# Patient Record
Sex: Female | Born: 1960 | Race: White | Hispanic: No | Smoking: Former smoker
Health system: Southern US, Community
[De-identification: ages and names within clinical notes are randomized; demographics above are authoritative.]

## PROBLEM LIST (undated history)

## (undated) DIAGNOSIS — Z8719 Personal history of other diseases of the digestive system: Secondary | ICD-10-CM

## (undated) DIAGNOSIS — E785 Hyperlipidemia, unspecified: Secondary | ICD-10-CM

## (undated) DIAGNOSIS — F411 Generalized anxiety disorder: Secondary | ICD-10-CM

## (undated) DIAGNOSIS — M1990 Unspecified osteoarthritis, unspecified site: Secondary | ICD-10-CM

## (undated) DIAGNOSIS — E039 Hypothyroidism, unspecified: Secondary | ICD-10-CM

## (undated) DIAGNOSIS — R062 Wheezing: Secondary | ICD-10-CM

## (undated) DIAGNOSIS — I10 Essential (primary) hypertension: Secondary | ICD-10-CM

## (undated) DIAGNOSIS — G4730 Sleep apnea, unspecified: Secondary | ICD-10-CM

## (undated) DIAGNOSIS — N959 Unspecified menopausal and perimenopausal disorder: Secondary | ICD-10-CM

## (undated) DIAGNOSIS — IMO0002 Reserved for concepts with insufficient information to code with codable children: Secondary | ICD-10-CM

## (undated) DIAGNOSIS — Z8489 Family history of other specified conditions: Secondary | ICD-10-CM

## (undated) DIAGNOSIS — R351 Nocturia: Secondary | ICD-10-CM

## (undated) DIAGNOSIS — J019 Acute sinusitis, unspecified: Secondary | ICD-10-CM

## (undated) DIAGNOSIS — K219 Gastro-esophageal reflux disease without esophagitis: Secondary | ICD-10-CM

## (undated) DIAGNOSIS — M549 Dorsalgia, unspecified: Secondary | ICD-10-CM

## (undated) DIAGNOSIS — G4733 Obstructive sleep apnea (adult) (pediatric): Secondary | ICD-10-CM

## (undated) HISTORY — DX: Gastro-esophageal reflux disease without esophagitis: K21.9

## (undated) HISTORY — DX: Hyperlipidemia, unspecified: E78.5

## (undated) HISTORY — DX: Unspecified osteoarthritis, unspecified site: M19.90

## (undated) HISTORY — DX: Hypothyroidism, unspecified: E03.9

## (undated) HISTORY — DX: Dorsalgia, unspecified: M54.9

## (undated) HISTORY — DX: Wheezing: R06.2

## (undated) HISTORY — DX: Acute sinusitis, unspecified: J01.90

## (undated) HISTORY — DX: Sleep apnea, unspecified: G47.30

## (undated) HISTORY — PX: TONSILLECTOMY: SUR1361

## (undated) HISTORY — PX: OTHER SURGICAL HISTORY: SHX169

## (undated) HISTORY — DX: Obstructive sleep apnea (adult) (pediatric): G47.33

## (undated) HISTORY — DX: Generalized anxiety disorder: F41.1

## (undated) HISTORY — DX: Essential (primary) hypertension: I10

## (undated) HISTORY — DX: Reserved for concepts with insufficient information to code with codable children: IMO0002

## (undated) HISTORY — DX: Unspecified menopausal and perimenopausal disorder: N95.9

---
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
## 1998-03-11 ENCOUNTER — Other Ambulatory Visit: Admission: RE | Admit: 1998-03-11 | Discharge: 1998-03-11 | Payer: Self-pay | Admitting: Obstetrics and Gynecology

## 1999-04-10 ENCOUNTER — Other Ambulatory Visit: Admission: RE | Admit: 1999-04-10 | Discharge: 1999-04-10 | Payer: Self-pay | Admitting: Obstetrics and Gynecology

## 2002-01-26 HISTORY — PX: ABDOMINAL HYSTERECTOMY: SHX81

## 2004-01-23 ENCOUNTER — Ambulatory Visit: Payer: Self-pay | Admitting: Internal Medicine

## 2004-01-24 ENCOUNTER — Encounter: Admission: RE | Admit: 2004-01-24 | Discharge: 2004-01-24 | Payer: Self-pay | Admitting: Internal Medicine

## 2004-01-24 ENCOUNTER — Ambulatory Visit: Payer: Self-pay | Admitting: Internal Medicine

## 2004-03-28 ENCOUNTER — Ambulatory Visit: Payer: Self-pay | Admitting: Internal Medicine

## 2004-04-16 ENCOUNTER — Other Ambulatory Visit: Admission: RE | Admit: 2004-04-16 | Discharge: 2004-04-16 | Payer: Self-pay | Admitting: Gynecology

## 2005-04-20 ENCOUNTER — Other Ambulatory Visit: Admission: RE | Admit: 2005-04-20 | Discharge: 2005-04-20 | Payer: Self-pay | Admitting: Gynecology

## 2005-04-24 ENCOUNTER — Encounter: Admission: RE | Admit: 2005-04-24 | Discharge: 2005-04-24 | Payer: Self-pay | Admitting: Gynecology

## 2005-05-04 ENCOUNTER — Ambulatory Visit: Payer: Self-pay | Admitting: Internal Medicine

## 2005-05-08 ENCOUNTER — Encounter: Admission: RE | Admit: 2005-05-08 | Discharge: 2005-05-08 | Payer: Self-pay | Admitting: Gynecology

## 2005-06-30 ENCOUNTER — Ambulatory Visit: Payer: Self-pay | Admitting: Internal Medicine

## 2006-01-26 LAB — CONVERTED CEMR LAB: Pap Smear: NORMAL

## 2006-02-13 ENCOUNTER — Ambulatory Visit: Payer: Self-pay | Admitting: Family Medicine

## 2006-04-21 ENCOUNTER — Ambulatory Visit: Payer: Self-pay | Admitting: Internal Medicine

## 2006-04-22 ENCOUNTER — Other Ambulatory Visit: Admission: RE | Admit: 2006-04-22 | Discharge: 2006-04-22 | Payer: Self-pay | Admitting: Gynecology

## 2006-05-11 ENCOUNTER — Encounter: Admission: RE | Admit: 2006-05-11 | Discharge: 2006-05-11 | Payer: Self-pay | Admitting: Gynecology

## 2006-05-25 ENCOUNTER — Ambulatory Visit: Payer: Self-pay | Admitting: Internal Medicine

## 2007-01-13 ENCOUNTER — Ambulatory Visit: Payer: Self-pay | Admitting: Internal Medicine

## 2007-01-13 DIAGNOSIS — K219 Gastro-esophageal reflux disease without esophagitis: Secondary | ICD-10-CM | POA: Insufficient documentation

## 2007-01-13 HISTORY — DX: Gastro-esophageal reflux disease without esophagitis: K21.9

## 2007-01-13 LAB — CONVERTED CEMR LAB
ALT: 18 units/L (ref 0–35)
AST: 19 units/L (ref 0–37)
Albumin: 3.7 g/dL (ref 3.5–5.2)
Alkaline Phosphatase: 53 units/L (ref 39–117)
BUN: 5 mg/dL — ABNORMAL LOW (ref 6–23)
Basophils Absolute: 0 10*3/uL (ref 0.0–0.1)
Basophils Relative: 0.6 % (ref 0.0–1.0)
Bilirubin Urine: NEGATIVE
Bilirubin, Direct: 0.1 mg/dL (ref 0.0–0.3)
CO2: 31 meq/L (ref 19–32)
Calcium: 9 mg/dL (ref 8.4–10.5)
Chloride: 103 meq/L (ref 96–112)
Cholesterol: 195 mg/dL (ref 0–200)
Creatinine, Ser: 0.7 mg/dL (ref 0.4–1.2)
Eosinophils Absolute: 0.1 10*3/uL (ref 0.0–0.6)
Eosinophils Relative: 1.2 % (ref 0.0–5.0)
GFR calc Af Amer: 115 mL/min
GFR calc non Af Amer: 95 mL/min
Glucose, Bld: 91 mg/dL (ref 70–99)
HCT: 37.7 % (ref 36.0–46.0)
HDL: 53.9 mg/dL (ref >39.0)
Hemoglobin, Urine: NEGATIVE
Hemoglobin: 13.1 g/dL (ref 12.0–15.0)
Ketones, ur: NEGATIVE mg/dL
LDL Cholesterol: 120 mg/dL — ABNORMAL HIGH (ref 0–99)
Leukocytes, UA: NEGATIVE
Lymphocytes Relative: 25.2 % (ref 12.0–46.0)
MCHC: 34.7 g/dL (ref 30.0–36.0)
MCV: 87.1 fL (ref 78.0–100.0)
Monocytes Absolute: 0.7 10*3/uL (ref 0.2–0.7)
Monocytes Relative: 10.9 % (ref 3.0–11.0)
Neutro Abs: 4.3 10*3/uL (ref 1.4–7.7)
Neutrophils Relative %: 62.1 % (ref 43.0–77.0)
Nitrite: NEGATIVE
Platelets: 303 10*3/uL (ref 150–400)
Potassium: 3.9 meq/L (ref 3.5–5.1)
RBC: 4.32 M/uL (ref 3.87–5.11)
RDW: 11.6 % (ref 11.5–14.6)
Sodium: 139 meq/L (ref 135–145)
Specific Gravity, Urine: 1.01 (ref 1.000–1.03)
TSH: 3.02 microintl units/mL (ref 0.35–5.50)
Total Bilirubin: 0.6 mg/dL (ref 0.3–1.2)
Total CHOL/HDL Ratio: 3.6
Total Protein, Urine: NEGATIVE mg/dL
Total Protein: 6.4 g/dL (ref 6.0–8.3)
Triglycerides: 108 mg/dL (ref 0–149)
Urine Glucose: NEGATIVE mg/dL
Urobilinogen, UA: 0.2 (ref 0.0–1.0)
VLDL: 22 mg/dL (ref 0–40)
WBC: 6.8 10*3/uL (ref 4.5–10.5)
pH: 8 (ref 5.0–8.0)

## 2007-03-28 LAB — CONVERTED CEMR LAB: Pap Smear: NORMAL

## 2007-03-30 ENCOUNTER — Ambulatory Visit: Payer: Self-pay | Admitting: Internal Medicine

## 2007-03-30 DIAGNOSIS — F329 Major depressive disorder, single episode, unspecified: Secondary | ICD-10-CM

## 2007-03-30 DIAGNOSIS — J019 Acute sinusitis, unspecified: Secondary | ICD-10-CM

## 2007-03-30 DIAGNOSIS — F32A Depression, unspecified: Secondary | ICD-10-CM | POA: Insufficient documentation

## 2007-03-30 DIAGNOSIS — F411 Generalized anxiety disorder: Secondary | ICD-10-CM

## 2007-03-30 DIAGNOSIS — E039 Hypothyroidism, unspecified: Secondary | ICD-10-CM | POA: Insufficient documentation

## 2007-03-30 HISTORY — DX: Acute sinusitis, unspecified: J01.90

## 2007-03-30 HISTORY — DX: Generalized anxiety disorder: F41.1

## 2007-03-30 HISTORY — DX: Hypothyroidism, unspecified: E03.9

## 2007-04-05 ENCOUNTER — Telehealth (INDEPENDENT_AMBULATORY_CARE_PROVIDER_SITE_OTHER): Payer: Self-pay | Admitting: *Deleted

## 2007-04-15 ENCOUNTER — Ambulatory Visit: Payer: Self-pay | Admitting: Internal Medicine

## 2007-04-25 ENCOUNTER — Other Ambulatory Visit: Admission: RE | Admit: 2007-04-25 | Discharge: 2007-04-25 | Payer: Self-pay | Admitting: Gynecology

## 2007-05-12 ENCOUNTER — Encounter: Admission: RE | Admit: 2007-05-12 | Discharge: 2007-05-12 | Payer: Self-pay | Admitting: Gynecology

## 2007-06-29 ENCOUNTER — Ambulatory Visit: Payer: Self-pay | Admitting: Internal Medicine

## 2007-06-29 DIAGNOSIS — R5383 Other fatigue: Secondary | ICD-10-CM | POA: Insufficient documentation

## 2007-06-29 DIAGNOSIS — R5381 Other malaise: Secondary | ICD-10-CM | POA: Insufficient documentation

## 2007-06-29 DIAGNOSIS — G4733 Obstructive sleep apnea (adult) (pediatric): Secondary | ICD-10-CM | POA: Insufficient documentation

## 2007-06-29 HISTORY — DX: Obstructive sleep apnea (adult) (pediatric): G47.33

## 2007-06-29 LAB — CONVERTED CEMR LAB
ALT: 16 units/L (ref 0–35)
AST: 18 units/L (ref 0–37)
Albumin: 3.7 g/dL (ref 3.5–5.2)
Alkaline Phosphatase: 55 units/L (ref 39–117)
BUN: 9 mg/dL (ref 6–23)
Basophils Absolute: 0 10*3/uL (ref 0.0–0.1)
Basophils Relative: 0.6 % (ref 0.0–1.0)
Bilirubin, Direct: 0.1 mg/dL (ref 0.0–0.3)
CO2: 30 meq/L (ref 19–32)
Calcium: 9.1 mg/dL (ref 8.4–10.5)
Chloride: 102 meq/L (ref 96–112)
Creatinine, Ser: 0.6 mg/dL (ref 0.4–1.2)
Eosinophils Absolute: 0.1 10*3/uL (ref 0.0–0.7)
Eosinophils Relative: 1.5 % (ref 0.0–5.0)
GFR calc Af Amer: 138 mL/min
GFR calc non Af Amer: 114 mL/min
Glucose, Bld: 83 mg/dL (ref 70–99)
HCT: 37.1 % (ref 36.0–46.0)
Hemoglobin: 13.2 g/dL (ref 12.0–15.0)
Lymphocytes Relative: 39.4 % (ref 12.0–46.0)
MCHC: 35.6 g/dL (ref 30.0–36.0)
MCV: 87.6 fL (ref 78.0–100.0)
Monocytes Absolute: 0.7 10*3/uL (ref 0.1–1.0)
Monocytes Relative: 10.1 % (ref 3.0–12.0)
Neutro Abs: 3.7 10*3/uL (ref 1.4–7.7)
Neutrophils Relative %: 48.4 % (ref 43.0–77.0)
Platelets: 304 10*3/uL (ref 150–400)
Potassium: 3.8 meq/L (ref 3.5–5.1)
RBC: 4.24 M/uL (ref 3.87–5.11)
RDW: 11.6 % (ref 11.5–14.6)
Sodium: 139 meq/L (ref 135–145)
TSH: 1.05 microintl units/mL (ref 0.35–5.50)
Total Bilirubin: 0.6 mg/dL (ref 0.3–1.2)
Total Protein: 6.6 g/dL (ref 6.0–8.3)
WBC: 7.4 10*3/uL (ref 4.5–10.5)

## 2007-07-08 ENCOUNTER — Ambulatory Visit: Payer: Self-pay | Admitting: Internal Medicine

## 2007-07-22 ENCOUNTER — Ambulatory Visit: Payer: Self-pay | Admitting: Pulmonary Disease

## 2007-08-11 ENCOUNTER — Ambulatory Visit (HOSPITAL_BASED_OUTPATIENT_CLINIC_OR_DEPARTMENT_OTHER): Admission: RE | Admit: 2007-08-11 | Discharge: 2007-08-11 | Payer: Self-pay | Admitting: Pulmonary Disease

## 2007-08-11 ENCOUNTER — Encounter: Payer: Self-pay | Admitting: Pulmonary Disease

## 2007-08-18 ENCOUNTER — Ambulatory Visit: Payer: Self-pay | Admitting: Pulmonary Disease

## 2007-08-29 ENCOUNTER — Telehealth (INDEPENDENT_AMBULATORY_CARE_PROVIDER_SITE_OTHER): Payer: Self-pay | Admitting: *Deleted

## 2007-09-01 ENCOUNTER — Ambulatory Visit: Payer: Self-pay | Admitting: Pulmonary Disease

## 2007-09-28 ENCOUNTER — Ambulatory Visit: Payer: Self-pay | Admitting: Pulmonary Disease

## 2007-10-26 ENCOUNTER — Encounter: Payer: Self-pay | Admitting: Pulmonary Disease

## 2007-11-23 ENCOUNTER — Ambulatory Visit: Payer: Self-pay | Admitting: Internal Medicine

## 2007-11-23 DIAGNOSIS — R42 Dizziness and giddiness: Secondary | ICD-10-CM | POA: Insufficient documentation

## 2007-11-24 ENCOUNTER — Telehealth (INDEPENDENT_AMBULATORY_CARE_PROVIDER_SITE_OTHER): Payer: Self-pay | Admitting: *Deleted

## 2007-12-06 ENCOUNTER — Telehealth (INDEPENDENT_AMBULATORY_CARE_PROVIDER_SITE_OTHER): Payer: Self-pay | Admitting: *Deleted

## 2008-02-06 ENCOUNTER — Telehealth: Payer: Self-pay | Admitting: Pulmonary Disease

## 2008-02-16 ENCOUNTER — Ambulatory Visit: Payer: Self-pay | Admitting: Internal Medicine

## 2008-02-20 ENCOUNTER — Telehealth (INDEPENDENT_AMBULATORY_CARE_PROVIDER_SITE_OTHER): Payer: Self-pay | Admitting: *Deleted

## 2008-02-21 ENCOUNTER — Ambulatory Visit: Payer: Self-pay | Admitting: Internal Medicine

## 2008-02-22 ENCOUNTER — Telehealth (INDEPENDENT_AMBULATORY_CARE_PROVIDER_SITE_OTHER): Payer: Self-pay | Admitting: *Deleted

## 2008-02-22 DIAGNOSIS — E785 Hyperlipidemia, unspecified: Secondary | ICD-10-CM

## 2008-02-22 HISTORY — DX: Hyperlipidemia, unspecified: E78.5

## 2008-02-22 LAB — CONVERTED CEMR LAB
ALT: 20 units/L (ref 0–35)
AST: 17 units/L (ref 0–37)
Albumin: 3.8 g/dL (ref 3.5–5.2)
Alkaline Phosphatase: 42 units/L (ref 39–117)
BUN: 9 mg/dL (ref 6–23)
Basophils Absolute: 0.1 10*3/uL (ref 0.0–0.1)
Basophils Relative: 1.1 % (ref 0.0–3.0)
Bilirubin Urine: NEGATIVE
Bilirubin, Direct: 0.1 mg/dL (ref 0.0–0.3)
CO2: 31 meq/L (ref 19–32)
Calcium: 9.5 mg/dL (ref 8.4–10.5)
Chloride: 103 meq/L (ref 96–112)
Cholesterol: 231 mg/dL (ref 0–200)
Creatinine, Ser: 0.6 mg/dL (ref 0.4–1.2)
Direct LDL: 128.6 mg/dL
Eosinophils Absolute: 0.1 10*3/uL (ref 0.0–0.7)
Eosinophils Relative: 1.9 % (ref 0.0–5.0)
GFR calc Af Amer: 137 mL/min
GFR calc non Af Amer: 113 mL/min
Glucose, Bld: 90 mg/dL (ref 70–99)
HCT: 36.9 % (ref 36.0–46.0)
HDL: 67 mg/dL (ref >39.0)
Hemoglobin, Urine: NEGATIVE
Hemoglobin: 12.8 g/dL (ref 12.0–15.0)
Ketones, ur: NEGATIVE mg/dL
Leukocytes, UA: NEGATIVE
Lymphocytes Relative: 43.7 % (ref 12.0–46.0)
MCHC: 34.6 g/dL (ref 30.0–36.0)
MCV: 89.9 fL (ref 78.0–100.0)
Monocytes Absolute: 0.5 10*3/uL (ref 0.1–1.0)
Monocytes Relative: 9.6 % (ref 3.0–12.0)
Neutro Abs: 2.2 10*3/uL (ref 1.4–7.7)
Neutrophils Relative %: 43.7 % (ref 43.0–77.0)
Nitrite: NEGATIVE
Platelets: 293 10*3/uL (ref 150–400)
Potassium: 4.3 meq/L (ref 3.5–5.1)
RBC: 4.1 M/uL (ref 3.87–5.11)
RDW: 11.9 % (ref 11.5–14.6)
Sodium: 141 meq/L (ref 135–145)
Specific Gravity, Urine: 1.025 (ref 1.000–1.03)
TSH: 10.28 microintl units/mL — ABNORMAL HIGH (ref 0.35–5.50)
Total Bilirubin: 0.5 mg/dL (ref 0.3–1.2)
Total CHOL/HDL Ratio: 3.4
Total Protein, Urine: NEGATIVE mg/dL
Total Protein: 6.5 g/dL (ref 6.0–8.3)
Triglycerides: 111 mg/dL (ref 0–149)
Urine Glucose: NEGATIVE mg/dL
Urobilinogen, UA: 0.2 (ref 0.0–1.0)
VLDL: 22 mg/dL (ref 0–40)
WBC: 5.1 10*3/uL (ref 4.5–10.5)
pH: 6 (ref 5.0–8.0)

## 2008-02-23 ENCOUNTER — Ambulatory Visit: Payer: Self-pay | Admitting: Internal Medicine

## 2008-02-23 ENCOUNTER — Encounter: Admission: RE | Admit: 2008-02-23 | Discharge: 2008-02-23 | Payer: Self-pay | Admitting: Internal Medicine

## 2008-02-23 DIAGNOSIS — IMO0002 Reserved for concepts with insufficient information to code with codable children: Secondary | ICD-10-CM

## 2008-02-23 DIAGNOSIS — N959 Unspecified menopausal and perimenopausal disorder: Secondary | ICD-10-CM

## 2008-02-23 HISTORY — DX: Reserved for concepts with insufficient information to code with codable children: IMO0002

## 2008-02-23 HISTORY — DX: Unspecified menopausal and perimenopausal disorder: N95.9

## 2008-03-22 ENCOUNTER — Ambulatory Visit: Payer: Self-pay | Admitting: Internal Medicine

## 2008-03-22 LAB — CONVERTED CEMR LAB: TSH: 6.02 microintl units/mL — ABNORMAL HIGH (ref 0.35–5.50)

## 2008-03-26 LAB — HM MAMMOGRAPHY: HM Mammogram: NORMAL

## 2008-03-27 ENCOUNTER — Ambulatory Visit: Payer: Self-pay | Admitting: Pulmonary Disease

## 2008-04-20 ENCOUNTER — Encounter: Payer: Self-pay | Admitting: Internal Medicine

## 2008-04-24 ENCOUNTER — Encounter: Payer: Self-pay | Admitting: Internal Medicine

## 2008-05-01 ENCOUNTER — Encounter: Admission: RE | Admit: 2008-05-01 | Discharge: 2008-05-01 | Payer: Self-pay | Admitting: Orthopedic Surgery

## 2008-05-07 ENCOUNTER — Other Ambulatory Visit: Admission: RE | Admit: 2008-05-07 | Discharge: 2008-05-07 | Payer: Self-pay | Admitting: Gynecology

## 2008-05-07 ENCOUNTER — Ambulatory Visit: Payer: Self-pay | Admitting: Women's Health

## 2008-05-07 ENCOUNTER — Encounter: Payer: Self-pay | Admitting: Women's Health

## 2008-05-15 ENCOUNTER — Encounter: Admission: RE | Admit: 2008-05-15 | Discharge: 2008-05-15 | Payer: Self-pay | Admitting: Gynecology

## 2008-06-05 ENCOUNTER — Ambulatory Visit: Payer: Self-pay | Admitting: Internal Medicine

## 2008-06-27 ENCOUNTER — Encounter: Payer: Self-pay | Admitting: Internal Medicine

## 2008-08-08 ENCOUNTER — Ambulatory Visit: Payer: Self-pay | Admitting: Internal Medicine

## 2009-01-08 ENCOUNTER — Ambulatory Visit: Payer: Self-pay | Admitting: Internal Medicine

## 2009-01-08 DIAGNOSIS — R062 Wheezing: Secondary | ICD-10-CM

## 2009-01-08 HISTORY — DX: Wheezing: R06.2

## 2009-02-20 ENCOUNTER — Ambulatory Visit: Payer: Self-pay | Admitting: Internal Medicine

## 2009-02-20 DIAGNOSIS — M549 Dorsalgia, unspecified: Secondary | ICD-10-CM

## 2009-02-20 DIAGNOSIS — R300 Dysuria: Secondary | ICD-10-CM | POA: Insufficient documentation

## 2009-02-20 HISTORY — DX: Dorsalgia, unspecified: M54.9

## 2009-02-20 LAB — CONVERTED CEMR LAB
Bilirubin Urine: NEGATIVE
Glucose, Urine, Semiquant: NEGATIVE
Ketones, urine, test strip: NEGATIVE
Nitrite: NEGATIVE
Protein, U semiquant: NEGATIVE
Specific Gravity, Urine: 1.01
Urobilinogen, UA: 0.2
WBC Urine, dipstick: NEGATIVE
pH: 6

## 2009-02-21 ENCOUNTER — Encounter: Payer: Self-pay | Admitting: Internal Medicine

## 2009-03-15 ENCOUNTER — Ambulatory Visit: Payer: Self-pay | Admitting: Internal Medicine

## 2009-03-15 LAB — CONVERTED CEMR LAB
ALT: 17 units/L (ref 0–35)
AST: 18 units/L (ref 0–37)
Albumin: 3.8 g/dL (ref 3.5–5.2)
Alkaline Phosphatase: 47 units/L (ref 39–117)
BUN: 8 mg/dL (ref 6–23)
Basophils Absolute: 0 10*3/uL (ref 0.0–0.1)
Basophils Relative: 0.6 % (ref 0.0–3.0)
Bilirubin Urine: NEGATIVE
Bilirubin, Direct: 0.1 mg/dL (ref 0.0–0.3)
CO2: 30 meq/L (ref 19–32)
Calcium: 8.9 mg/dL (ref 8.4–10.5)
Chloride: 102 meq/L (ref 96–112)
Cholesterol: 232 mg/dL — ABNORMAL HIGH (ref 0–200)
Creatinine, Ser: 0.5 mg/dL (ref 0.4–1.2)
Direct LDL: 102.1 mg/dL
Eosinophils Absolute: 0.1 10*3/uL (ref 0.0–0.7)
Eosinophils Relative: 1.5 % (ref 0.0–5.0)
GFR calc non Af Amer: 139.22 mL/min (ref >60)
Glucose, Bld: 83 mg/dL (ref 70–99)
HCT: 39.2 % (ref 36.0–46.0)
HDL: 86.8 mg/dL (ref >39.00)
Hemoglobin, Urine: NEGATIVE
Hemoglobin: 13.3 g/dL (ref 12.0–15.0)
Ketones, ur: NEGATIVE mg/dL
Leukocytes, UA: NEGATIVE
Lymphocytes Relative: 34.8 % (ref 12.0–46.0)
Lymphs Abs: 1.8 10*3/uL (ref 0.7–4.0)
MCHC: 33.8 g/dL (ref 30.0–36.0)
MCV: 92 fL (ref 78.0–100.0)
Monocytes Absolute: 0.5 10*3/uL (ref 0.1–1.0)
Monocytes Relative: 8.9 % (ref 3.0–12.0)
Neutro Abs: 2.9 10*3/uL (ref 1.4–7.7)
Neutrophils Relative %: 54.2 % (ref 43.0–77.0)
Nitrite: NEGATIVE
Platelets: 341 10*3/uL (ref 150.0–400.0)
Potassium: 4 meq/L (ref 3.5–5.1)
RBC: 4.26 M/uL (ref 3.87–5.11)
RDW: 12.2 % (ref 11.5–14.6)
Sodium: 138 meq/L (ref 135–145)
Specific Gravity, Urine: 1.01 (ref 1.000–1.030)
TSH: 3.02 microintl units/mL (ref 0.35–5.50)
Total Bilirubin: 0.5 mg/dL (ref 0.3–1.2)
Total CHOL/HDL Ratio: 3
Total Protein, Urine: NEGATIVE mg/dL
Total Protein: 7 g/dL (ref 6.0–8.3)
Triglycerides: 286 mg/dL — ABNORMAL HIGH (ref 0.0–149.0)
Urine Glucose: NEGATIVE mg/dL
Urobilinogen, UA: 0.2 (ref 0.0–1.0)
VLDL: 57.2 mg/dL — ABNORMAL HIGH (ref 0.0–40.0)
WBC: 5.3 10*3/uL (ref 4.5–10.5)
pH: 7 (ref 5.0–8.0)

## 2009-03-22 ENCOUNTER — Ambulatory Visit: Payer: Self-pay | Admitting: Internal Medicine

## 2009-03-25 ENCOUNTER — Telehealth: Payer: Self-pay | Admitting: Internal Medicine

## 2009-05-08 ENCOUNTER — Other Ambulatory Visit: Admission: RE | Admit: 2009-05-08 | Discharge: 2009-05-08 | Payer: Self-pay | Admitting: Gynecology

## 2009-05-08 ENCOUNTER — Ambulatory Visit: Payer: Self-pay | Admitting: Women's Health

## 2009-05-16 ENCOUNTER — Encounter: Admission: RE | Admit: 2009-05-16 | Discharge: 2009-05-16 | Payer: Self-pay | Admitting: Gynecology

## 2009-08-29 ENCOUNTER — Ambulatory Visit: Payer: Self-pay | Admitting: Internal Medicine

## 2009-11-01 ENCOUNTER — Ambulatory Visit: Payer: Self-pay | Admitting: Internal Medicine

## 2009-11-01 DIAGNOSIS — R1013 Epigastric pain: Secondary | ICD-10-CM | POA: Insufficient documentation

## 2009-11-01 LAB — CONVERTED CEMR LAB
ALT: 13 units/L (ref 0–35)
AST: 19 units/L (ref 0–37)
Albumin: 3.8 g/dL (ref 3.5–5.2)
Alkaline Phosphatase: 52 units/L (ref 39–117)
BUN: 8 mg/dL (ref 6–23)
Basophils Absolute: 0 10*3/uL (ref 0.0–0.1)
Basophils Relative: 0.8 % (ref 0.0–3.0)
Bilirubin, Direct: 0 mg/dL (ref 0.0–0.3)
CO2: 29 meq/L (ref 19–32)
Calcium: 8.9 mg/dL (ref 8.4–10.5)
Chloride: 102 meq/L (ref 96–112)
Creatinine, Ser: 0.6 mg/dL (ref 0.4–1.2)
Eosinophils Absolute: 0.1 10*3/uL (ref 0.0–0.7)
Eosinophils Relative: 1.5 % (ref 0.0–5.0)
GFR calc non Af Amer: 110.39 mL/min (ref >60)
Glucose, Bld: 75 mg/dL (ref 70–99)
H Pylori IgG: NEGATIVE
HCT: 36.8 % (ref 36.0–46.0)
Hemoglobin: 12.8 g/dL (ref 12.0–15.0)
Lipase: 30 units/L (ref 11.0–59.0)
Lymphocytes Relative: 39 % (ref 12.0–46.0)
Lymphs Abs: 2.2 10*3/uL (ref 0.7–4.0)
MCHC: 34.8 g/dL (ref 30.0–36.0)
MCV: 90 fL (ref 78.0–100.0)
Monocytes Absolute: 0.5 10*3/uL (ref 0.1–1.0)
Monocytes Relative: 8.2 % (ref 3.0–12.0)
Neutro Abs: 2.9 10*3/uL (ref 1.4–7.7)
Neutrophils Relative %: 50.5 % (ref 43.0–77.0)
Platelets: 316 10*3/uL (ref 150.0–400.0)
Potassium: 4.1 meq/L (ref 3.5–5.1)
RBC: 4.09 M/uL (ref 3.87–5.11)
RDW: 13.1 % (ref 11.5–14.6)
Sodium: 139 meq/L (ref 135–145)
Total Bilirubin: 0.4 mg/dL (ref 0.3–1.2)
Total Protein: 6.5 g/dL (ref 6.0–8.3)
WBC: 5.7 10*3/uL (ref 4.5–10.5)

## 2009-12-23 ENCOUNTER — Ambulatory Visit: Payer: Self-pay | Admitting: Internal Medicine

## 2009-12-25 ENCOUNTER — Telehealth: Payer: Self-pay | Admitting: Internal Medicine

## 2009-12-28 ENCOUNTER — Ambulatory Visit: Payer: Self-pay | Admitting: Internal Medicine

## 2010-02-25 NOTE — Progress Notes (Signed)
  Phone Note Call from Patient Call back at Home Phone [phone]   Caller: [phone] Call For: Corwin Levins MD Summary of Call: should she see him again for her  back problems , should she be refered  Initial call taken by: Shelbie Proctor,  February 20, 2008 2:39 PM  Follow-up for Phone Call        ok to make OV with CPX labs v70.0 - can discuss then Follow-up by: Corwin Levins MD,  February 20, 2008 2:48 PM  Additional Follow-up for Phone Call Additional follow up Details #1::        called pt to inform nancy made appt Additional Follow-up by: Shelbie Proctor,  February 20, 2008 4:34 PM

## 2010-02-25 NOTE — Assessment & Plan Note (Signed)
Summary: CPX/NWS  #   Vital Signs:  Patient profile:   50 year old female Height:      63.5 inches Weight:      226.25 pounds BMI:     39.59 O2 Sat:      97 % on Room air Temp:     98 degrees F oral Pulse rate:   74 / minute BP sitting:   120 / 78  (left arm) Cuff size:   large  Vitals Entered ByZella Ball Ewing (March 22, 2009 10:54 AM)  O2 Flow:  Room air  Preventive Care Screening  Bone Density:    Date:  02/23/2008    Next Due:  02/2010    Results:  normal std dev  Mammogram:    Date:  03/26/2008    Results:  normal   CC: Adult Physical/RE   Primary Care Provider:  Jonny Ruiz  CC:  Adult Physical/RE.  History of Present Illness: overall doing well, no complaints,  Pt denies CP, sob, doe, wheezing, orthopnea, pnd, worsening LE edema, palps, dizziness or syncope   Pt denies new neuro symptoms such as headache, facial or extremity weakness     Problems Prior to Update: 1)  Back Pain  (ICD-724.5) 2)  Dysuria  (ICD-788.1) 3)  Sinusitis- Acute-nos  (ICD-461.9) 4)  Wheezing  (ICD-786.07) 5)  Menopausal Disorder  (ICD-627.9) 6)  Thoracic/lumbosacral Neuritis/radiculitis Unspec  (ICD-724.4) 7)  Preventive Health Care  (ICD-V70.0) 8)  Hyperlipidemia  (ICD-272.4) 9)  Sinusitis- Acute-nos  (ICD-461.9) 10)  Vertigo  (ICD-780.4) 11)  Sinusitis- Acute-nos  (ICD-461.9) 12)  Fatigue  (ICD-780.79) 13)  Obstructive Sleep Apnea  (ICD-327.23) 14)  Sinusitis- Acute-nos  (ICD-461.9) 15)  Depression  (ICD-311) 16)  Anxiety  (ICD-300.00) 17)  Hypothyroidism  (ICD-244.9) 18)  Preventive Health Care  (ICD-V70.0) 19)  Gerd  (ICD-530.81)  Medications Prior to Update: 1)  Prilosec 20 Mg  Cpdr (Omeprazole) .Marland Kitchen.. 1 By Mouth Qd 2)  Estrace 2 Mg Tabs (Estradiol) .... Take 1 Tablet By Mouth Once A Day 3)  Multivitamins   Tabs (Multiple Vitamin) .... Take 1 Tablet By Mouth Once A Day 4)  Caltrate 600+d 600-400 Mg-Unit  Tabs (Calcium Carbonate-Vitamin D) .... Take 1 Tablet By Mouth Two Times  A Day 5)  Alprazolam 0.25 Mg  Tabs (Alprazolam) .Marland Kitchen.. 1 By Mouth Once Daily As Needed 6)  Vitamin D3 1000 Unit  Caps (Cholecalciferol) .... Take 1 Tablet By Mouth Two Times A Day 7)  Fish Oil 1000 Mg  Caps (Omega-3 Fatty Acids) .Marland Kitchen.. 1 By Mouth Qd 8)  Vitamin B-12 1000 Mcg Tabs (Cyanocobalamin) .... Once Daily 9)  Levothyroxine Sodium 75 Mcg Tabs (Levothyroxine Sodium) .Marland Kitchen.. 1 By Mouth Once Daily 10)  Ciprofloxacin Hcl 500 Mg Tabs (Ciprofloxacin Hcl) .Marland Kitchen.. 1 By Mouth Two Times A Day 11)  Flexeril 5 Mg Tabs (Cyclobenzaprine Hcl) .Marland Kitchen.. 1 By Mouth Three Times A Day As Needed 12)  Fluconazole 150 Mg Tabs (Fluconazole) .Marland Kitchen.. 1po Q 3 Days As Needed 13)  Tramadol Hcl 50 Mg Tabs (Tramadol Hcl) .... Use Asd  Current Medications (verified): 1)  Omeprazole 20 Mg Cpdr (Omeprazole) .Marland Kitchen.. 1po Once Daily 2)  Estrace 2 Mg Tabs (Estradiol) .... Take 1 Tablet By Mouth Once A Day 3)  Multivitamins   Tabs (Multiple Vitamin) .... Take 1 Tablet By Mouth Once A Day 4)  Caltrate 600+d 600-400 Mg-Unit  Tabs (Calcium Carbonate-Vitamin D) .... Take 1 Tablet By Mouth Two Times A Day 5)  Alprazolam 0.25 Mg  Tabs (Alprazolam) .Marland Kitchen.. 1 By Mouth Once Daily As Needed 6)  Vitamin D3 1000 Unit  Caps (Cholecalciferol) .... Take 1 Tablet By Mouth Two Times A Day 7)  Fish Oil 1000 Mg  Caps (Omega-3 Fatty Acids) .... 2 By Mouth Once Daily 8)  Vitamin B-12 1000 Mcg Tabs (Cyanocobalamin) .... 2 Po  Daily 9)  Levothyroxine Sodium 75 Mcg Tabs (Levothyroxine Sodium) .Marland Kitchen.. 1 By Mouth Once Daily 10)  Tramadol Hcl 50 Mg Tabs (Tramadol Hcl) .... Use Asd  Allergies (verified): 1)  ! Codeine  Past History:  Past Medical History: Last updated: 02/23/2008 GERD Hypothyroidism migraine Anxiety Depression scoliosis OSA Hyperlipidemia  Past Surgical History: Last updated: 02/23/2008 Cholecystectomy - 1980 Hysterectomy 2004 jawbone surgury at 50 yo Oophorectomy  Family History: Last updated: 02/23/2008 grandmother with  osteoporosis father with IBS Family History of Arthritis mother with TIA 2007, hemochromatosis stroke HTN DM lymphoma asthma: brother  cancer: mother (basil cell) mother and grandmother with thyroid disease grandfather with blood clots in the legs  Social History: Last updated: 03/30/2007 Former Smoker Alcohol 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         all otherwise negative per pt -  Physical Exam  General:  alert and overweight-appearing.   Head:  normocephalic and atraumatic.   Eyes:  vision grossly intact, pupils equal, and pupils round.   Ears:  R ear normal and L ear normal.   Nose:  no external deformity and no nasal discharge.   Mouth:  no gingival abnormalities and pharynx pink and moist.   Neck:  supple and no masses.   Lungs:  normal respiratory effort and normal breath sounds.   Heart:  normal rate and regular rhythm.   Abdomen:  soft, non-tender, and normal bowel sounds.   Msk:  no joint tenderness and no joint swelling.   Extremities:  no edema, no erythema  Neurologic:  cranial nerves II-XII intact and strength normal in all extremities.     Impression & Recommendations:  Problem # 1:  Preventive Health Care (ICD-V70.0) Overall doing well, age appropriate education and counseling updated and referral for appropriate preventive services done unless declined, immunizations up to date or declined, diet counseling done if overweight,  urged to quit smoking if smokes , most recent labs reviewed and current ordered if appropriate, ecg reviewed or declined (interpretation per ECG scanned in the EMR if done); information regarding Medicare Prevention requirements given if appropriate   Orders: EKG w/ Interpretation (93000)  Complete Medication List: 1)  Omeprazole 20 Mg Cpdr (Omeprazole) .Marland Kitchen.. 1po once daily 2)  Estrace 2 Mg Tabs (Estradiol) .... Take 1 tablet by mouth once a day 3)  Multivitamins Tabs (Multiple vitamin) .... Take 1 tablet by mouth once a day 4)  Caltrate 600+d 600-400 Mg-unit Tabs (Calcium carbonate-vitamin d) .... Take 1 tablet by mouth two times a day 5)  Alprazolam 0.25 Mg Tabs (Alprazolam) .Marland Kitchen.. 1 by mouth once daily as needed 6)  Vitamin D3 1000 Unit Caps (Cholecalciferol) .... Take 1 tablet by mouth two times a day 7)  Fish Oil 1000 Mg Caps (Omega-3 fatty acids) .... 2  by mouth once daily 8)  Vitamin B-12 1000 Mcg Tabs (Cyanocobalamin) .... 2 po  daily 9)  Levothyroxine Sodium 75 Mcg Tabs (Levothyroxine sodium) .Marland Kitchen.. 1 by mouth once daily 10)  Tramadol Hcl 50 Mg Tabs (Tramadol hcl) .... Use asd  Patient Instructions: 1)  Continue all previous medications as before this visit  2)  please stop the vit E supplement 3)  Please schedule a follow-up appointment in 1 year or sooner if needed Prescriptions: OMEPRAZOLE 20 MG CPDR (OMEPRAZOLE) 1po once daily  #90 x 3   Entered and Authorized by:   Corwin Levins MD   Signed by:   Corwin Levins MD on 03/22/2009   Method used:   Electronically to        [facility] [id number]* (retail)       [address]       [place], [place]  [address]       Ph: [phone]       Fax: [phone]   RxID:   [id number]    Immunization History:  Influenza Immunization History:    Influenza:  historical (10/26/2008)

## 2010-02-25 NOTE — Assessment & Plan Note (Signed)
Summary: SINUS/ $50 /NWS   Vital Signs:  Patient Profile:   50 Years Old Female Weight:      208 pounds Temp:     97.9 degrees F Pulse rate:   67 / minute BP sitting:   136 / 89  (right arm) Cuff size:   regular  Pt. in pain?   no  Vitals Entered By: Maris Berger (July 08, 2007 2:40 PM)                  Chief Complaint:  sinus trouble.  History of Present Illness: here with acute onset sinus pain, pressure, fever and greenish d/c for 2 days without cp, sob or wheezing    Updated Prior Medication List: PRILOSEC 20 MG  CPDR (OMEPRAZOLE) 1 by mouth qd ESTRACE 1 MG  TABS (ESTRADIOL) 1 by mouth qd TUSSIONEX PENNKINETIC ER 8-10 MG/5ML  LQCR (CHLORPHENIRAMINE-HYDROCODONE) 1 tsp by mouth two times a day prn * MULT. VIT 1 by mouth qd * CALIUM W/ VIT D 1 by mouth qd SYNTHROID 50 MCG  TABS (LEVOTHYROXINE SODIUM) 1 by mouth qd ALPRAZOLAM 0.25 MG  TABS (ALPRAZOLAM) 1 by mouth qd * OMAGA 3 1 by mouth qd * VITAMIN D 2000 mg 1 by mouth  qd FISH OIL 1000 MG  CAPS (OMEGA-3 FATTY ACIDS) 1 by mouth qd AVELOX 400 MG  TABS (MOXIFLOXACIN HCL) 1 by mouth once daily  Current Allergies (reviewed today): ! CODEINE  Past Medical History:    Reviewed history from 06/29/2007 and no changes required:       GERD       Hypothyroidism       migraine       Anxiety       Depression       scoliosis       OSA  Past Surgical History:    Reviewed history from 03/30/2007 and no changes required:       Cholecystectomy - 1980       Hysterectomy       jawbone surgury at 50 yo       Oophorectomy   Social History:    Reviewed history from 03/30/2007 and no changes required:       Former Smoker       Alcohol use-yes       Married       2 children       housewife    Review of Systems       all otherwise negative    Physical Exam  General:     mild ill, well developed  Head:     Normocephalic and atraumatic without obvious abnormalities. No apparent alopecia or  balding. Eyes:     No corneal or conjunctival inflammation noted. EOMI. Perrla.  Ears:     bilat tm's red, sinus tender bilat Nose:     nasal dischargemucosal pallor and mucosal erythema.   Mouth:     good dentition and pharyngeal erythema.   Neck:     supple and cervical lymphadenopathy.   Lungs:     Normal respiratory effort, chest expands symmetrically. Lungs are clear to auscultation, no crackles or wheezes. Heart:     Normal rate and regular rhythm. S1 and S2 normal without gallop, murmur, click, rub or other extra sounds. Extremities:     no edema, no ulcers     Impression & Recommendations:  Problem # 1:  SINUSITIS- ACUTE-NOS (ICD-461.9)  Her updated medication list for this problem  includes:    Tussionex Pennkinetic Er 8-10 Mg/63ml Lqcr (Chlorpheniramine-hydrocodone) .Marland Kitchen... 1 tsp by mouth two times a day prn    Avelox 400 Mg Tabs (Moxifloxacin hcl) .Marland Kitchen... 1 by mouth once daily treat as above, f/u any worsening signs or symptoms   Complete Medication List: 1)  Prilosec 20 Mg Cpdr (Omeprazole) .Marland Kitchen.. 1 by mouth qd 2)  Estrace 1 Mg Tabs (Estradiol) .Marland Kitchen.. 1 by mouth qd 3)  Tussionex Pennkinetic Er 8-10 Mg/20ml Lqcr (Chlorpheniramine-hydrocodone) .Marland Kitchen.. 1 tsp by mouth two times a day prn 4)  Mult. Vit  .Marland KitchenMarland Kitchen. 1 by mouth qd 5)  Calium W/ Vit D  .... 1 by mouth qd 6)  Synthroid 50 Mcg Tabs (Levothyroxine sodium) .Marland Kitchen.. 1 by mouth qd 7)  Alprazolam 0.25 Mg Tabs (Alprazolam) .Marland Kitchen.. 1 by mouth qd 8)  Omaga 3  .Marland Kitchen.. 1 by mouth qd 9)  Vitamin D  .... 2000 mg 1 by mouth  qd 10)  Fish Oil 1000 Mg Caps (Omega-3 fatty acids) .Marland Kitchen.. 1 by mouth qd 11)  Avelox 400 Mg Tabs (Moxifloxacin hcl) .Marland Kitchen.. 1 by mouth once daily   Patient Instructions: 1)  take all new medications as prescribed  2)  continue all medications that you may have been taking previously 3)  Please schedule a follow-up appointment as needed. 4)  you can also take Mucinex D for congestion   Prescriptions: AVELOX 400 MG  TABS  (MOXIFLOXACIN HCL) 1 by mouth once daily  #10 x 0   Entered and Authorized by:   Corwin Levins MD   Signed by:   Corwin Levins MD on 07/08/2007   Method used:   Print then Give to Patient   RxID:   [id number]  ]

## 2010-02-25 NOTE — Assessment & Plan Note (Signed)
Summary: sinus inf? congested/cd   Vital Signs:  Patient profile:   50 year old female Height:      63.5 inches Weight:      212.25 pounds BMI:     37.14 O2 Sat:      96 % on Room air Temp:     98.5 degrees F oral Pulse rate:   70 / minute BP sitting:   110 / 80  (left arm) Cuff size:   large  Vitals Entered By: Zella Ball Ewing CMA Duncan Dull) (December 23, 2009 11:26 AM)  O2 Flow:  Room air CC: Cough, congestion, ear pain/RE   Primary Care Provider:  Jonny Ruiz  CC:  Cough, congestion, and ear pain/RE.  History of Present Illness: here for acute f/u; overall doing ok, but here with acute onset midl to mod 3 days facial pain, pressure, fever, greenish d/c, mild ST, but Pt denies CP, worsening sob, doe, wheezing, orthopnea, pnd, worsening LE edema, palps, dizziness or syncope  Pt denies new neuro symptoms such as headache, facial or extremity weakness  Pt denies polydipsia, polyuria  Overall good compliance with meds, trying to follow low chol  diet, wt stable, little excercise however Denies worsening depressive symptoms, suicidal ideation, or panic.   Overall good compliance with meds, and good tolerability.  No recent wt loss, night sweats, loss of appetite or other constitutional symptoms  No further refux, and no dysphagia, n/v, abd pain, bowel change, or blood.    Problems Prior to Update: 1)  Sinusitis- Acute-nos  (ICD-461.9) 2)  Epigastric Pain  (ICD-789.06) 3)  Back Pain  (ICD-724.5) 4)  Dysuria  (ICD-788.1) 5)  Sinusitis- Acute-nos  (ICD-461.9) 6)  Wheezing  (ICD-786.07) 7)  Menopausal Disorder  (ICD-627.9) 8)  Thoracic/lumbosacral Neuritis/radiculitis Unspec  (ICD-724.4) 9)  Preventive Health Care  (ICD-V70.0) 10)  Hyperlipidemia  (ICD-272.4) 11)  Sinusitis- Acute-nos  (ICD-461.9) 12)  Vertigo  (ICD-780.4) 13)  Sinusitis- Acute-nos  (ICD-461.9) 14)  Fatigue  (ICD-780.79) 15)  Obstructive Sleep Apnea  (ICD-327.23) 16)  Sinusitis- Acute-nos  (ICD-461.9) 17)  Depression   (ICD-311) 18)  Anxiety  (ICD-300.00) 19)  Hypothyroidism  (ICD-244.9) 20)  Preventive Health Care  (ICD-V70.0) 21)  Gerd  (ICD-530.81)  Medications Prior to Update: 1)  Nexium 40 Mg Cpdr (Esomeprazole Magnesium) .Marland Kitchen.. 1po Once Daily 2)  Estrace 2 Mg Tabs (Estradiol) .... Take 1 Tablet By Mouth Once A Day 3)  Multivitamins   Tabs (Multiple Vitamin) .... Take 1 Tablet By Mouth Once A Day 4)  Caltrate 600+d 600-400 Mg-Unit  Tabs (Calcium Carbonate-Vitamin D) .... Take 1 Tablet By Mouth Two Times A Day 5)  Alprazolam 0.25 Mg  Tabs (Alprazolam) .Marland Kitchen.. 1 By Mouth Once Daily As Needed 6)  Vitamin D3 1000 Unit  Caps (Cholecalciferol) .... Take 1 Tablet By Mouth Two Times A Day 7)  Fish Oil 1000 Mg  Caps (Omega-3 Fatty Acids) .... 2 By Mouth Once Daily 8)  Vitamin B-12 1000 Mcg Tabs (Cyanocobalamin) .... 2 Po  Daily 9)  Levothyroxine Sodium 75 Mcg Tabs (Levothyroxine Sodium) .Marland Kitchen.. 1 By Mouth Once Daily 10)  Tramadol Hcl 50 Mg Tabs (Tramadol Hcl) .... Use Asd 11)  Clarithromycin 500 Mg Tabs (Clarithromycin) .Marland Kitchen.. 1 By Mouth Two Times A Day 12)  Fluconazole 150 Mg Tabs (Fluconazole) .Marland Kitchen.. 1 By Mouth Every Other Day As Needed  Current Medications (verified): 1)  Nexium 40 Mg Cpdr (Esomeprazole Magnesium) .Marland Kitchen.. 1po Once Daily 2)  Estrace 2 Mg Tabs (Estradiol) .... Take 1 Tablet  By Mouth Once A Day 3)  Multivitamins   Tabs (Multiple Vitamin) .... Take 1 Tablet By Mouth Once A Day 4)  Caltrate 600+d 600-400 Mg-Unit  Tabs (Calcium Carbonate-Vitamin D) .... Take 1 Tablet By Mouth Two Times A Day 5)  Alprazolam 0.25 Mg  Tabs (Alprazolam) .Marland Kitchen.. 1 By Mouth Once Daily As Needed 6)  Vitamin D3 1000 Unit  Caps (Cholecalciferol) .... Take 1 Tablet By Mouth Two Times A Day 7)  Fish Oil 1000 Mg  Caps (Omega-3 Fatty Acids) .... 2 By Mouth Once Daily 8)  Vitamin B-12 1000 Mcg Tabs (Cyanocobalamin) .... 2 Po  Daily 9)  Levothyroxine Sodium 75 Mcg Tabs (Levothyroxine Sodium) .Marland Kitchen.. 1 By Mouth Once Daily 10)  Tramadol Hcl 50  Mg Tabs (Tramadol Hcl) .... Use Asd 11)  Levofloxacin 500 Mg Tabs (Levofloxacin) .Marland Kitchen.. 1po Once Daily 12)  Hydrocodone-Homatropine 5-1.5 Mg/24ml Syrp (Hydrocodone-Homatropine) .Marland Kitchen.. 1 Tsp By Mouth Q 6 Hrs As Needed Cough 13)  Flonase 50 Mcg/act Susp (Fluticasone Propionate) .... 2 Squirts in Each Nostril Daily 14)  Promethazine Hcl 25 Mg Tabs (Promethazine Hcl) .... Take One Every 6 Hours As Needed Nausea  Allergies (verified): 1)  ! 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   Eyes:  vision grossly intact, pupils equal, and pupils round.   Ears:  bilat tm's red, sinus tender bilat Nose:  nasal dischargemucosal pallor and mucosal edema.   Mouth:  pharyngeal erythema and fair dentition.   Neck:  supple and cervical lymphadenopathy.   Lungs:  normal respiratory effort and normal breath sounds.   Heart:  normal rate and regular rhythm.   Abdomen:  soft and normal bowel sounds.  with improved epigastric tender Extremities:  no edema, no erythema  Psych:  not depressed appearing and slightly anxious.     Impression & Recommendations:  Problem # 1:  SINUSITIS- ACUTE-NOS (ICD-461.9)  Her updated medication list for this problem includes:    Levofloxacin 500 Mg Tabs (Levofloxacin) .Marland Kitchen... 1po once daily    Hydrocodone-homatropine 5-1.5 Mg/27ml Syrp (Hydrocodone-homatropine) .Marland Kitchen... 1 tsp by mouth q 6 hrs as needed  cough    Flonase 50 Mcg/act Susp (Fluticasone propionate) .Marland Kitchen... 2 squirts in each nostril daily treat as above, f/u any worsening signs or symptoms , has significant post nasal issue with cough - for mucinex and delsym for congestin and cough, salt water gargles  Problem # 2:  GERD (ICD-530.81)  Her updated medication list for this problem includes:    Nexium 40 Mg Cpdr (Esomeprazole magnesium) .Marland Kitchen... 1po once daily  Labs Reviewed: Hgb: 12.8 (11/01/2009)   Hct: 36.8 (11/01/2009) stable overall by hx and exam, ok to continue meds/tx as is  - improved symptoms on new PPI  Problem # 3:  DEPRESSION (ICD-311)  Her updated medication list for this problem includes:    Alprazolam 0.25 Mg Tabs (Alprazolam) .Marland Kitchen... 1 by mouth once daily as needed stable overall by hx and exam, ok to continue meds/tx as is  Discussed treatment options, including trial of antidpressant medication. Verified that the patient has no suicidal ideation at this time.   Complete Medication List: 1)  Nexium 40 Mg Cpdr (Esomeprazole magnesium) .Marland Kitchen.. 1po once daily 2)  Estrace 2 Mg Tabs (Estradiol) .... Take 1 tablet by mouth once a day 3)  Multivitamins Tabs (Multiple vitamin) .... Take 1 tablet by mouth once a day 4)  Caltrate 600+d 600-400 Mg-unit Tabs (Calcium carbonate-vitamin d) .... Take 1 tablet by mouth two times a day 5)  Alprazolam 0.25 Mg Tabs (Alprazolam) .Marland Kitchen.. 1 by mouth once daily as needed 6)  Vitamin D3 1000 Unit Caps (Cholecalciferol) .... Take 1 tablet by mouth two times a day 7)  Fish Oil 1000 Mg Caps (Omega-3 fatty acids) .... 2 by mouth once daily 8)  Vitamin B-12 1000 Mcg Tabs (Cyanocobalamin) .... 2 po  daily 9)  Levothyroxine Sodium 75 Mcg Tabs (Levothyroxine sodium) .Marland Kitchen.. 1 by mouth once daily 10)  Tramadol Hcl 50 Mg Tabs (Tramadol hcl) .... Use asd 11)  Levofloxacin 500 Mg Tabs (Levofloxacin) .Marland Kitchen.. 1po once daily 12)  Hydrocodone-homatropine 5-1.5 Mg/18ml Syrp (Hydrocodone-homatropine) .Marland Kitchen.. 1 tsp by  mouth q 6 hrs as needed cough 13)  Flonase 50 Mcg/act Susp (Fluticasone propionate) .... 2 squirts in each nostril daily 14)  Promethazine Hcl 25 Mg Tabs (Promethazine hcl) .... Take one every 6 hours as needed nausea  Patient Instructions: 1)  Please take all new medications as prescribed 2)  Continue all previous medications as before this visit  3)  Please schedule a follow-up appointment in 3 months for CPX with labs Prescriptions: HYDROCODONE-HOMATROPINE 5-1.5 MG/5ML SYRP (HYDROCODONE-HOMATROPINE) 1 tsp by mouth q 6 hrs as needed cough  #6 oz x 1   Entered and Authorized by:   Corwin Levins MD   Signed by:   Corwin Levins MD on 12/23/2009   Method used:   Print then Give to Patient   RxID:   [id number] LEVOFLOXACIN 500 MG TABS (LEVOFLOXACIN) 1po once daily  #10 x 0   Entered and Authorized by:   Corwin Levins MD   Signed by:   Corwin Levins MD on 12/23/2009   Method used:   Print then Give to Patient   RxID:   [id number]    Orders Added: 1)  Est. Patient Level IV [84696]

## 2010-02-25 NOTE — Assessment & Plan Note (Signed)
Summary: FU-STC   Vital Signs:  Patient Profile:   50 Years Old Female Weight:      201 pounds Temp:     97.6 degrees F oral Pulse rate:   66 / minute BP sitting:   125 / 78  (right arm) Cuff size:   regular  Pt. in pain?   no  Vitals Entered By: Maris Berger (January 13, 2007 8:21 AM)                  Preventive Care Screening  Last Flu Shot:    Date:  11/29/2006    Results:  given   Mammogram:    Date:  02/26/2006    Results:  normal   Pap Smear:    Date:  01/26/2006    Results:  normal    Chief Complaint:  sore throat  and cough.  History of Present Illness: here with acute URI symtpoms, st, and cough  Current Allergies (reviewed today): No known allergies  Updated/Current Medications (including changes made in today's visit):  PRILOSEC 20 MG  CPDR (OMEPRAZOLE) 1 by mouth qd ESTRACE 1 MG  TABS (ESTRADIOL) 1 by mouth qd AZITHROMYCIN 250 MG  TABS (AZITHROMYCIN) 2 by mouth once daily for 1 day, then 1 by mouth once daily for 4 days, then stop TUSSIONEX PENNKINETIC ER 8-10 MG/5ML  LQCR (CHLORPHENIRAMINE-HYDROCODONE) 1 tsp by mouth two times a day prn   Past Medical History:    Reviewed history and no changes required:       GERD  Past Surgical History:    Reviewed history and no changes required:       Cholecystectomy - 1980       Hysterectomy       jawbone surgury at 50 yo   Family History:    Reviewed history and no changes required:       grandmother with osteoporosis       father with IBS       Family History of Arthritis       mother with TIA 2007  Social History:    Reviewed history and no changes required:       Former Smoker       Alcohol use-yes   Risk Factors:  Tobacco use:  quit Alcohol use:  yes  Mammogram History:     Date of Last Mammogram:  02/26/2006    Results:  normal   PAP Smear History:     Date of Last PAP Smear:  01/26/2006    Results:  normal    Review of Systems  The patient denies anorexia,  fever, weight loss, vision loss, decreased hearing, hoarseness, chest pain, syncope, dyspnea on exhertion, peripheral edema, prolonged cough, hemoptysis, abdominal pain, melena, hematochezia, severe indigestion/heartburn, hematuria, incontinence, genital sores, muscle weakness, suspicious skin lesions, transient blindness, difficulty walking, depression, unusual weight change, abnormal bleeding, enlarged lymph nodes, angioedema, and breast masses.     Physical Exam  General:     mild ill Head:     Normocephalic and atraumatic without obvious abnormalities. No apparent alopecia or balding. Eyes:     No corneal or conjunctival inflammation noted. EOMI. Perrla. Ears:     bilat tm's red Nose:     nasal dischargemucosal pallor.   Mouth:     pharyngeal erythema.   Neck:     No deformities, masses, or tenderness noted. Lungs:     Normal respiratory effort, chest expands symmetrically. Lungs are clear to auscultation, no crackles  or wheezes. Heart:     Normal rate and regular rhythm. S1 and S2 normal without gallop, murmur, click, rub or other extra sounds. Abdomen:     Bowel sounds positive,abdomen soft and non-tender without masses, organomegaly or hernias noted. Msk:     No deformity or scoliosis noted of thoracic or lumbar spine.   Extremities:     No clubbing, cyanosis, edema, or deformity noted with normal full range of motion of all joints.   Neurologic:     No cranial nerve deficits noted. Station and gait are normal. Plantar reflexes are down-going bilaterally. DTRs are symmetrical throughout. Sensory, motor and coordinative functions appear intact.    Impression & Recommendations:  Problem # 1:  BRONCHITIS-ACUTE (ICD-466.0)  Her updated medication list for this problem includes:    Azithromycin 250 Mg Tabs (Azithromycin) .Marland Kitchen... 2 by mouth once daily for 1 day, then 1 by mouth once daily for 4 days, then stop    Tussionex Pennkinetic Er 8-10 Mg/18ml Lqcr  (Chlorpheniramine-hydrocodone) .Marland Kitchen... 1 tsp by mouth two times a day prn  tx as above  Problem # 2:  PREVENTIVE HEALTH CARE (ICD-V70.0) overall doing well, up to date, counseled on routine health concerns, to check routine labs Orders: TLB-BMP (Basic Metabolic Panel-BMET) (80048-METABOL) TLB-CBC Platelet - w/Differential (85025-CBCD) TLB-Hepatic/Liver Function Pnl (80076-HEPATIC) TLB-Lipid Panel (80061-LIPID) TLB-TSH (Thyroid Stimulating Hormone) (84443-TSH) TLB-Udip ONLY (81003-UDIP)   Complete Medication List: 1)  Prilosec 20 Mg Cpdr (Omeprazole) .Marland Kitchen.. 1 by mouth qd 2)  Estrace 1 Mg Tabs (Estradiol) .Marland Kitchen.. 1 by mouth qd 3)  Azithromycin 250 Mg Tabs (Azithromycin) .... 2 by mouth once daily for 1 day, then 1 by mouth once daily for 4 days, then stop 4)  Tussionex Pennkinetic Er 8-10 Mg/64ml Lqcr (Chlorpheniramine-hydrocodone) .Marland Kitchen.. 1 tsp by mouth two times a day prn   Patient Instructions: 1)  Take all medications as prescribed 2)  You will have blood work today 3)  Please schedule a follow-up appointment as needed.    Prescriptions: Sandria Senter ER 8-10 MG/5ML  LQCR (CHLORPHENIRAMINE-HYDROCODONE) 1 tsp by mouth two times a day prn  #4 oz x 1   Entered and Authorized by:   Corwin Levins MD   Signed by:   Corwin Levins MD on 01/13/2007   Method used:   Print then Give to Patient   RxID:   [id number] AZITHROMYCIN 250 MG  TABS (AZITHROMYCIN) 2 by mouth once daily for 1 day, then 1 by mouth once daily for 4 days, then stop  #6 x 1   Entered and Authorized by:   Corwin Levins MD   Signed by:   Corwin Levins MD on 01/13/2007   Method used:   Print then Give to Patient   RxID:   [id number]  ]

## 2010-02-25 NOTE — Progress Notes (Signed)
Summary: yeast infection  Phone Note Call from Patient Call back at Home Phone [phone]   Caller: Patient/ cell [phone] Call For: Corwin Levins MD Summary of Call: Per [name] call was prescribe antibotic  ,now developled a Yeast Infection want a medication Diflucan (fluconazole ) .[facility] (retail) [address] [place], [place]  [address] Ph: [phone] Fax: [phone] Initial call taken by: Shelbie Proctor,  December 06, 2007 9:20 AM  Follow-up for Phone Call        done escript Follow-up by: Corwin Levins MD,  December 06, 2007 9:50 AM  Additional Follow-up for Phone Call Additional follow up Details #1::        Phone Call Completed, Rx Called In, Provider Notified pt notfied and infromed Additional Follow-up by: Shelbie Proctor,  December 06, 2007 9:53 AM    New/Updated Medications: FLUCONAZOLE 150 MG TABS (FLUCONAZOLE) 1 by mouth q 3 days as needed   Prescriptions: FLUCONAZOLE 150 MG TABS (FLUCONAZOLE) 1 by mouth q 3 days as needed  #2 x 1   Entered and Authorized by:   Corwin Levins MD   Signed by:   Corwin Levins MD on 12/06/2007   Method used:   Electronically to        [facility] (retail)       [address]       [place], [place]  [address]       Ph: [phone]       Fax: [phone]   RxID:   [id number]

## 2010-02-25 NOTE — Assessment & Plan Note (Signed)
Summary: sinus problem--$50---stc   Vital Signs:  Patient Profile:   50 Years Old Female Weight:      215 pounds O2 Sat:      98 % O2 treatment:    Room Air Temp:     98.2 degrees F oral Pulse rate:   79 / minute BP sitting:   122 / 74  (left arm) Cuff size:   regular  Vitals Entered By: Payton Spark CMA (February 16, 2008 11:16 AM)                 Referred by:  Oliver Barre PCP:  Jonny Ruiz  Chief Complaint:  sinusitis.  History of Present Illness: here with acute onset facial pain, pressure, fever and greenish d/c for 3 days; no ST, cough, sob, doe,  wheezing, , CP, orthopnea, pnd or LE edema    Prior Medications Reviewed Using: Patient Recall  Updated Prior Medication List: PRILOSEC 20 MG  CPDR (OMEPRAZOLE) 1 by mouth qd ESTRACE 1 MG  TABS (ESTRADIOL) 1 by mouth qd MULTIVITAMINS   TABS (MULTIPLE VITAMIN) Take 1 tablet by mouth once a day CALTRATE 600+D 600-400 MG-UNIT  TABS (CALCIUM CARBONATE-VITAMIN D) Take 1 tablet by mouth two times a day ALPRAZOLAM 0.25 MG  TABS (ALPRAZOLAM) 1 by mouth once daily as needed VITAMIN D3 1000 UNIT  CAPS (CHOLECALCIFEROL) Take 1 tablet by mouth two times a day FISH OIL 1000 MG  CAPS (OMEGA-3 FATTY ACIDS) 1 by mouth qd VITAMIN B-12 1000 MCG TABS (CYANOCOBALAMIN) once daily CEPHALEXIN 500 MG CAPS (CEPHALEXIN) 1 by mouth three times a day  Current Allergies (reviewed today): ! 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  , mild ill  Head:     Normocephalic and atraumatic without obvious abnormalities. No apparent alopecia or balding. Eyes:     No corneal or conjunctival inflammation noted. EOMI. Perrla. Ears:     bilat tm's red, sinus tender bilat Nose:     nasal dischargemucosal pallor and mucosal erythema.   Mouth:     pharyngeal erythema and fair dentition.   Neck:     supple and cervical lymphadenopathy.   Lungs:     Normal respiratory effort, chest expands symmetrically. Lungs are clear to auscultation, no crackles or wheezes. Heart:     Normal rate and regular rhythm. S1 and S2 normal without gallop, murmur, click, rub or other extra sounds.  Extremities:     no edema, no ulcers     Impression & Recommendations:  Problem # 1:  SINUSITIS- ACUTE-NOS (ICD-461.9)  The following medications were removed from the medication list:    Azithromycin 250 Mg Tabs (Azithromycin) .Marland Kitchen... 2po qd for 1 day, then 1po qd for 4days, then stop  Her updated medication list for this problem includes:    Cephalexin 500 Mg Caps (Cephalexin) .Marland Kitchen... 1 by mouth three times a day treat as above, f/u any worsening signs or symptoms   Complete Medication List: 1)  Prilosec 20 Mg Cpdr (Omeprazole) .Marland Kitchen.. 1 by mouth qd 2)  Estrace 1 Mg Tabs (Estradiol) .Marland Kitchen.. 1 by mouth qd 3)  Multivitamins Tabs (Multiple vitamin) .... Take 1 tablet by mouth once a day 4)  Caltrate 600+d 600-400 Mg-unit Tabs (Calcium carbonate-vitamin d) .... Take 1 tablet by mouth two times a day 5)  Alprazolam 0.25 Mg Tabs (Alprazolam) .Marland Kitchen.. 1 by mouth once daily as needed 6)  Vitamin D3 1000 Unit Caps (Cholecalciferol) .... Take 1 tablet by mouth two times a day 7)  Fish Oil 1000 Mg Caps (Omega-3 fatty acids) .Marland Kitchen.. 1 by mouth qd 8)  Vitamin B-12 1000 Mcg Tabs (Cyanocobalamin) .... Once daily 9)  Cephalexin 500 Mg Caps  (Cephalexin) .Marland Kitchen.. 1 by mouth three times a day   Patient Instructions: 1)  Please take all new medications as prescribed 2)  Continue all medications that you may have been taking previously You can also use Mucinex OTC for congestion 3)  Please schedule a follow-up appointment in 6 months with CPX labs   Prescriptions: CEPHALEXIN 500 MG CAPS (CEPHALEXIN) 1 by mouth three times a day  #30 x 0   Entered and Authorized by:   Corwin Levins MD   Signed by:   Corwin Levins MD on 02/16/2008   Method used:   Print then Give to Patient   RxID:   [id number]

## 2010-02-25 NOTE — Assessment & Plan Note (Signed)
Summary: BACK PROBLEM/ LABS PRIOR/PER TRIAGE/NWS $50   Vital Signs:  Patient Profile:   50 Years Old Female Weight:      214.50 pounds Temp:     97.6 degrees F oral Pulse rate:   70 / minute BP sitting:   130 / 70  (right arm) Cuff size:   regular  Vitals Entered By: Windell Norfolk (February 23, 2008 9:09 AM)                 Preventive Care Screening  Last Tetanus Booster:    Date:  02/23/2008    Next Due:  02/2018    Results:  Tdap  Last Flu Shot:    Date:  11/28/2007    Results:  given    Referred by:  Oliver Barre PCP:  Jonny Ruiz  Chief Complaint:  Back Pain.  History of Present Illness: new antibiotic is working better; has ongoing fatigue and thyroid med adjusted (see emr notes in the last 2 days), here with new problem for her that she mentions for the first time today - actually has had grad increased lower back pain for several months now constant, usually 4/10 , occasiaonally severe, ibuprofen and heat pad dont seem to help anymore; bending at waist and walking; starts mid lumbar and radiates to the distal RLE calf; can walk as far as she wants if walks slow; can stand in the lsat few days with the pain at the worst she estiamates at 45 min; pain some better to sit up straight (as in driving a motor vehicle); sleep with a pillow under knees - helps at night; no specific weakness or falls although both legs feel heavy to go up steps (12 to 15 steps she has at home); does not need walker or cane;     Prior Medication List:  PRILOSEC 20 MG  CPDR (OMEPRAZOLE) 1 by mouth qd ESTRACE 1 MG  TABS (ESTRADIOL) 1 by mouth qd MULTIVITAMINS   TABS (MULTIPLE VITAMIN) Take 1 tablet by mouth once a day CALTRATE 600+D 600-400 MG-UNIT  TABS (CALCIUM CARBONATE-VITAMIN D) Take 1 tablet by mouth two times a day ALPRAZOLAM 0.25 MG  TABS (ALPRAZOLAM) 1 by mouth once daily as needed VITAMIN D3 1000 UNIT  CAPS (CHOLECALCIFEROL) Take 1 tablet by mouth two times a day FISH OIL 1000 MG  CAPS  (OMEGA-3 FATTY ACIDS) 1 by mouth qd VITAMIN B-12 1000 MCG TABS (CYANOCOBALAMIN) once daily CLARITHROMYCIN 500 MG TABS (CLARITHROMYCIN) 1 by mouth two times a day LEVOTHROID 50 MCG TABS (LEVOTHYROXINE SODIUM) 1 by mouth once daily   Updated Prior Medication List: PRILOSEC 20 MG  CPDR (OMEPRAZOLE) 1 by mouth qd ESTRACE 1 MG  TABS (ESTRADIOL) 1 by mouth qd MULTIVITAMINS   TABS (MULTIPLE VITAMIN) Take 1 tablet by mouth once a day CALTRATE 600+D 600-400 MG-UNIT  TABS (CALCIUM CARBONATE-VITAMIN D) Take 1 tablet by mouth two times a day ALPRAZOLAM 0.25 MG  TABS (ALPRAZOLAM) 1 by mouth once daily as needed VITAMIN D3 1000 UNIT  CAPS (CHOLECALCIFEROL) Take 1 tablet by mouth two times a day FISH OIL 1000 MG  CAPS (OMEGA-3 FATTY ACIDS) 1 by mouth qd VITAMIN B-12 1000 MCG TABS (CYANOCOBALAMIN) once daily CLARITHROMYCIN 500 MG TABS (CLARITHROMYCIN) 1 by mouth two times a day LEVOTHROID 50 MCG TABS (LEVOTHYROXINE SODIUM) 1 by mouth once daily  Current Allergies (reviewed today): ! CODEINE  Past Medical History:    Reviewed history from 06/29/2007 and no changes required:       GERD  Hypothyroidism       migraine       Anxiety       Depression       scoliosis       OSA       Hyperlipidemia  Past Surgical History:    Reviewed history from 03/30/2007 and no changes required:       Cholecystectomy - 1980       Hysterectomy 2004       jawbone surgury at 50 yo       Oophorectomy   Family History:    Reviewed history from 07/22/2007 and no changes required:       grandmother with osteoporosis       father with IBS       Family History of Arthritis       mother with TIA 2007, hemochromatosis       stroke       HTN       DM       lymphoma       asthma: brother        cancer: mother (basil cell)       mother and grandmother with thyroid disease       grandfather with blood clots in the legs  Social History:    Reviewed history from 03/30/2007 and no changes required:        Former Smoker       Alcohol use-yes       Married       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         all otherwise negative    Physical Exam  General:     alert and overweight-appearing.   Head:     Normocephalic and atraumatic without obvious abnormalities. No apparent alopecia or balding. Eyes:     No corneal or conjunctival inflammation noted. EOMI. Perrla.  Ears:     External ear exam shows no significant lesions or deformities.  Otoscopic examination reveals clear canals, tympanic membranes are intact bilaterally without bulging, retraction, inflammation or discharge. Hearing is grossly normal bilaterally. Nose:     External nasal examination shows no deformity or inflammation. Nasal mucosa are pink and moist without lesions or exudates. Mouth:     Oral mucosa and oropharynx without lesions or exudates.  Teeth in good repair. Neck:     No deformities, masses, or tenderness noted. Lungs:     Normal respiratory effort, chest expands symmetrically. Lungs are clear to auscultation, no crackles or wheezes. Heart:     Normal rate and regular rhythm. S1 and S2 normal without gallop, murmur, click, rub or other extra sounds. Abdomen:     Bowel sounds positive,abdomen soft and non-tender without masses, organomegaly or hernias noted. Msk:     no joint tenderness and no joint swelling.   Extremities:     no edema, no ulcers  Neurologic:     cranial nerves II-XII intact and strength normal in all extremities.       Impression & Recommendations:  Problem # 1:  Preventive Health Care (ICD-V70.0) Overall doing well, up to date, counseled  on routine health concerns for screening and prevention, immunizations up to date or declined, labs reviewed, also due for bone density scheduled  Problem # 2:  HYPOTHYROIDISM (ICD-244.9)  Her updated medication list for this problem includes:    Levothroid 50 Mcg Tabs (Levothyroxine sodium) .Marland Kitchen... 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 1 by mouth qd 2)  Estrace 1 Mg Tabs (Estradiol) .Marland Kitchen.. 1 by mouth qd 3)  Multivitamins Tabs (Multiple vitamin) .... Take 1 tablet by mouth once a day 4)  Caltrate 600+d 600-400 Mg-unit Tabs (Calcium carbonate-vitamin d) .... Take 1 tablet by mouth two times a day 5)  Alprazolam 0.25 Mg Tabs (Alprazolam) .Marland Kitchen.. 1 by mouth once daily as needed 6)  Vitamin D3 1000 Unit Caps (Cholecalciferol) .... Take 1 tablet by mouth two times a day 7)  Fish Oil 1000 Mg Caps (Omega-3 fatty acids) .Marland Kitchen.. 1 by mouth qd 8)  Vitamin B-12 1000 Mcg Tabs (Cyanocobalamin) .... Once daily 9)  Clarithromycin 500 Mg Tabs (Clarithromycin) .Marland Kitchen.. 1 by mouth two times a day 10)  Levothroid 50 Mcg Tabs (Levothyroxine sodium) .Marland Kitchen.. 1 by mouth once daily  Other Orders: Tdap => 46yrs IM (52841) Admin 1st Vaccine (32440) T-Bone Densitometry [phone])   Patient Instructions: 1)  please schedule the bone density today prior to leaving today 2)  You will be contacted about  the referral(s) to: MRI for the lower back, and the orthopedic referral 3)  please return for LAB work only in 4 wks : 4)  TSH : 244.8 5)  Please schedule a follow-up appointment in 1 year or sooner if needed     Tetanus/Td Vaccine    Vaccine Type: Tdap    Site: left deltoid    Mfr: [email or web address]    Dose: 0.5 ml    Route: IM    Given by: Windell Norfolk    Exp. Date: 12/30/2009    Lot #: [id number]   Other Immunization History:    H1N1 # 1:  H1N1 vaccine G code (11/28/2007)

## 2010-02-25 NOTE — Assessment & Plan Note (Signed)
Summary: SORE THROAT,COUGH,HEADACHE-$50-STC   Vital Signs:  Patient Profile:   50 Years Old Female Weight:      206.8 pounds Temp:     99.6 degrees F Pulse rate:   75 / minute Pulse rhythm:   regular BP sitting:   127 / 80  (right arm)  Vitals Entered By: Rock Nephew CMA (April 15, 2007 2:51 PM)                 Chief Complaint:  cough, headache, and sore throat.  History of Present Illness: here with acute onset prod cough, fever, ST and congestion    Updated Prior Medication List: PRILOSEC 20 MG  CPDR (OMEPRAZOLE) 1 by mouth qd ESTRACE 1 MG  TABS (ESTRADIOL) 1 by mouth qd AVELOX 400 MG  TABS (MOXIFLOXACIN HCL) 1po once daily for 7 days TUSSIONEX PENNKINETIC ER 8-10 MG/5ML  LQCR (CHLORPHENIRAMINE-HYDROCODONE) 1 tsp by mouth two times a day prn * MULT. VIT 1 by mouth qd * CALIUM W/ VIT D 1 by mouth qd FLUCONAZOLE 150 MG  TABS (FLUCONAZOLE) 1 by mouth q3 days prn  Current Allergies (reviewed today): ! 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 No apparent alopecia or balding. Eyes:     No corneal or conjunctival inflammation noted. EOMI. Perrla Ears:     bilat tm's red Nose:     nasal dischargemucosal pallor.   Mouth:     pharyngeal erythema.   Neck:     cervical lymphadenopathy.   Lungs:     Normal respiratory effort, chest expands  symmetrically. Lungs are clear to auscultation, no crackles or wheezes. Heart:     Normal rate and regular rhythm. S1 and S2 normal without gallop, murmur, click, rub or other extra sounds. Extremities:     no edema, no ulcers     Impression & Recommendations:  Problem # 1:  BRONCHITIS-ACUTE (ICD-466.0)  Her updated medication list for this problem includes:    Avelox 400 Mg Tabs (Moxifloxacin hcl) .Marland Kitchen... 1po once daily for 7 days    Tussionex Pennkinetic Er 8-10 Mg/52ml Lqcr (Chlorpheniramine-hydrocodone) .Marland Kitchen... 1 tsp by mouth two times a day prn tx as above, as well as mucinex D as needed, check cxr Orders: T-2 View CXR, Same Day (71020.5TC)   Complete Medication List: 1)  Prilosec 20 Mg Cpdr (Omeprazole) .Marland Kitchen.. 1 by mouth qd 2)  Estrace 1 Mg Tabs (Estradiol) .Marland Kitchen.. 1 by mouth qd 3)  Avelox 400 Mg  Tabs (Moxifloxacin hcl) .Marland Kitchen.. 1po once daily for 7 days 4)  Tussionex Pennkinetic Er 8-10 Mg/32ml Lqcr (Chlorpheniramine-hydrocodone) .Marland Kitchen.. 1 tsp by mouth two times a day prn 5)  Mult. Vit  .Marland KitchenMarland Kitchen. 1 by mouth qd 6)  Calium W/ Vit D  .... 1 by mouth qd 7)  Fluconazole 150 Mg Tabs (Fluconazole) .Marland Kitchen.. 1 by mouth q3 days prn   Patient Instructions: 1)  take all medications as prescribed; take the avelox 400 mg - 1 per day for 7 days 2)  continue all other medications that you may have been taking previously 3)  you can also take Mucinex D OTC for the congestion 4)  Please schedule a follow-up appointment as needed.    Prescriptions: FLUCONAZOLE 150 MG  TABS (FLUCONAZOLE) 1 by mouth q3 days prn  #2 x 1   Entered and Authorized by:   Corwin Levins MD   Signed by:   Corwin Levins MD on 04/15/2007   Method used:   Print then Give to Patient   RxID:   [id number]  ]

## 2010-02-25 NOTE — Assessment & Plan Note (Signed)
Summary: HEAD ACHE FACE PRESSURE LIGHT HEAD NAUSEA-$50--STC   Vital Signs:  Patient Profile:   50 Years Old Female Weight:      214 pounds O2 Sat:      97 % O2 treatment:    Room Air Temp:     99.2 degrees F oral Pulse rate:   62 / minute BP sitting:   120 / 76  (left arm) Cuff size:   large  Vitals Entered By: Payton Spark CMA (November 23, 2007 2:24 PM)                 Referred by:  Oliver Barre PCP:  Jonny Ruiz  Chief Complaint:  ha, facial pressure, nausea, and light headed.  History of Present Illness: here with above and greenish d/c for 3 days, also with left ear pressure assoc with vertigo and nausea    Updated Prior Medication List: PRILOSEC 20 MG  CPDR (OMEPRAZOLE) 1 by mouth qd ESTRACE 1 MG  TABS (ESTRADIOL) 1 by mouth qd MULTIVITAMINS   TABS (MULTIPLE VITAMIN) Take 1 tablet by mouth once a day CALTRATE 600+D 600-400 MG-UNIT  TABS (CALCIUM CARBONATE-VITAMIN D) Take 1 tablet by mouth two times a day ALPRAZOLAM 0.25 MG  TABS (ALPRAZOLAM) 1 by mouth once daily as needed VITAMIN D3 1000 UNIT  CAPS (CHOLECALCIFEROL) Take 1 tablet by mouth two times a day FISH OIL 1000 MG  CAPS (OMEGA-3 FATTY ACIDS) 1 by mouth qd VITAMIN B-12 1000 MCG TABS (CYANOCOBALAMIN) once daily AZITHROMYCIN 250 MG TABS (AZITHROMYCIN) 2po qd for 1 day, then 1po qd for 4days, then stop MECLIZINE HCL 12.5 MG TABS (MECLIZINE HCL) 1 - 2 by mouth q 6 hrs as needed dizzy  Current Allergies (reviewed today): ! CODEINE  Past Medical History:    Reviewed history from 06/29/2007 and no changes required:       GERD       Hypothyroidism       migraine       Anxiety       Depression       scoliosis       OSA  Past Surgical History:    Reviewed history from 03/30/2007 and no changes required:       Cholecystectomy - 1980       Hysterectomy       jawbone surgury at 49 yo       Oophorectomy   Social History:    Reviewed history from 03/30/2007 and no changes required:       Former Smoker  Alcohol use-yes       Married       2 children       housewife    Review of Systems       all otherwise negative    Physical Exam  General:     alert and overweight-appearing.  ,mild ill  Head:     Normocephalic and atraumatic without obvious abnormalities. No apparent alopecia or balding. Eyes:     No corneal or conjunctival inflammation noted. EOMI. Perrla.  Ears:     bilat tm's red, sinus tender bilat Nose:     nasal dischargemucosal pallor and mucosal erythema.   Mouth:     pharyngeal erythema and fair dentition.   Neck:     supple and cervical lymphadenopathy.   Lungs:     Normal respiratory effort, chest expands symmetrically. Lungs are clear to auscultation, no crackles or wheezes. Heart:     Normal rate and regular rhythm. S1  and S2 normal without gallop, murmur, click, rub or other extra sounds. Extremities:     no edema, no ulcers     Impression & Recommendations:  Problem # 1:  SINUSITIS- ACUTE-NOS (ICD-461.9)  Her updated medication list for this problem includes:    Azithromycin 250 Mg Tabs (Azithromycin) .Marland Kitchen... 2po qd for 1 day, then 1po qd for 4days, then stop treat as above, f/u any worsening signs or symptoms   Problem # 2:  VERTIGO (ICD-780.4) tx with meclizine as needed  Her updated medication list for this problem includes:    Meclizine Hcl 12.5 Mg Tabs (Meclizine hcl) .Marland Kitchen... 1 - 2 by mouth q 6 hrs as needed dizzy   Complete Medication List: 1)  Prilosec 20 Mg Cpdr (Omeprazole) .Marland Kitchen.. 1 by mouth qd 2)  Estrace 1 Mg Tabs (Estradiol) .Marland Kitchen.. 1 by mouth qd 3)  Multivitamins Tabs (Multiple vitamin) .... Take 1 tablet by mouth once a day 4)  Caltrate 600+d 600-400 Mg-unit Tabs (Calcium carbonate-vitamin d) .... Take 1 tablet by mouth two times a day 5)  Alprazolam 0.25 Mg Tabs (Alprazolam) .Marland Kitchen.. 1 by mouth once daily as needed 6)  Vitamin D3 1000 Unit Caps (Cholecalciferol) .... Take 1 tablet by mouth two times a day 7)  Fish Oil 1000 Mg Caps (Omega-3  fatty acids) .Marland Kitchen.. 1 by mouth qd 8)  Vitamin B-12 1000 Mcg Tabs (Cyanocobalamin) .... Once daily 9)  Azithromycin 250 Mg Tabs (Azithromycin) .... 2po qd for 1 day, then 1po qd for 4days, then stop 10)  Meclizine Hcl 12.5 Mg Tabs (Meclizine hcl) .Marland Kitchen.. 1 - 2 by mouth q 6 hrs as needed dizzy   Patient Instructions: 1)  Please take all new medications as prescribed 2)  Continue all medications that you may have been taking previously 3)  Please schedule a follow-up appointment in 6 months ith CPX labs   Prescriptions: MECLIZINE HCL 12.5 MG TABS (MECLIZINE HCL) 1 - 2 by mouth q 6 hrs as needed dizzy  #40 x 1   Entered and Authorized by:   Corwin Levins MD   Signed by:   Corwin Levins MD on 11/23/2007   Method used:   Print then Give to Patient   RxID:   [id number] AZITHROMYCIN 250 MG TABS (AZITHROMYCIN) 2po qd for 1 day, then 1po qd for 4days, then stop  #6 x 1   Entered and Authorized by:   Corwin Levins MD   Signed by:   Corwin Levins MD on 11/23/2007   Method used:   Print then Give to Patient   RxID:   [id number]  ]

## 2010-02-25 NOTE — Assessment & Plan Note (Signed)
Summary: SINUS PROBLEM-HEADACHE-STOMACH PROBLEM--STC   Vital Signs:  Patient profile:   50 year old female Height:      63.5 inches Weight:      213.50 pounds BMI:     37.36 O2 Sat:      96 % on Room air Temp:     97.7 degrees F oral Pulse rate:   62 / minute BP sitting:   136 / 76  (left arm) Cuff size:   regular  Vitals Entered By: Alysia Penna (November 01, 2009 11:24 AM)  O2 Flow:  Room air CC: pt c/o possible sinus infection & abdominal pain & pressure. "stomach feels like a ball". no diarrhea or constipation. has been feeling stressed. /cp sma   Primary Care Provider:  Jonny Ruiz  CC:  pt c/o possible sinus infection & abdominal pain & pressure. "stomach feels like a ball". no diarrhea or constipation. has been feeling stressed. /cp sma.  History of Present Illness: here with acute concerns, c/o 3 days mild to facial pain, pressure, fever and greenish d/c, with headache, weakness and malaise - overall similar to prior illness, but this time also with epigastric tender and discomfort unusual for her, mild to mod, non radiating, assoc with nausea in the am, sqeezing type pain for approx 1 wk.  No vomiting, other bowel change such as constipation, pepto bismol has helped minor flares in the past but not this time.  Pt denies CP, worsening sob, doe, wheezing, orthopnea, pnd, worsening LE edema, palps, dizziness or syncope  Pt denies new neuro symptoms such as headache, facial or extremity weakness  Pt denies polydipsia, polyuria.  Overall good compliance with meds, trying to follow low chol  diet, wt stable, little excercise however Has had significant rescent stressors in the past wk with family dog death, and daughter in ICU for several days.  Is taking the PPI,  s/p CCX.  Eating no change in abd pain.  Nothing makes better.   Preventive Screening-Counseling & Management      Drug Use:  no.    Problems Prior to Update: 1)  Epigastric Pain  (ICD-789.06) 2)  Back Pain   (ICD-724.5) 3)  Dysuria  (ICD-788.1) 4)  Sinusitis- Acute-nos  (ICD-461.9) 5)  Wheezing  (ICD-786.07) 6)  Menopausal Disorder  (ICD-627.9) 7)  Thoracic/lumbosacral Neuritis/radiculitis Unspec  (ICD-724.4) 8)  Preventive Health Care  (ICD-V70.0) 9)  Hyperlipidemia  (ICD-272.4) 10)  Sinusitis- Acute-nos  (ICD-461.9) 11)  Vertigo  (ICD-780.4) 12)  Sinusitis- Acute-nos  (ICD-461.9) 13)  Fatigue  (ICD-780.79) 14)  Obstructive Sleep Apnea  (ICD-327.23) 15)  Sinusitis- Acute-nos  (ICD-461.9) 16)  Depression  (ICD-311) 17)  Anxiety  (ICD-300.00) 18)  Hypothyroidism  (ICD-244.9) 19)  Preventive Health Care  (ICD-V70.0) 20)  Gerd  (ICD-530.81)  Medications Prior to Update: 1)  Omeprazole 20 Mg Cpdr (Omeprazole) .Marland Kitchen.. 1po Once Daily 2)  Estrace 2 Mg Tabs (Estradiol) .... Take 1 Tablet By Mouth Once A Day 3)  Multivitamins   Tabs (Multiple Vitamin) .... Take 1 Tablet By Mouth Once A Day 4)  Caltrate 600+d 600-400 Mg-Unit  Tabs (Calcium Carbonate-Vitamin D) .... Take 1 Tablet By Mouth Two Times A Day 5)  Alprazolam 0.25 Mg  Tabs (Alprazolam) .Marland Kitchen.. 1 By Mouth Once Daily As Needed 6)  Vitamin D3 1000 Unit  Caps (Cholecalciferol) .... Take 1 Tablet By Mouth Two Times A Day 7)  Fish Oil 1000 Mg  Caps (Omega-3 Fatty Acids) .... 2 By Mouth Once Daily 8)  Vitamin  B-12 1000 Mcg Tabs (Cyanocobalamin) .... 2 Po  Daily 9)  Levothyroxine Sodium 75 Mcg Tabs (Levothyroxine Sodium) .Marland Kitchen.. 1 By Mouth Once Daily 10)  Tramadol Hcl 50 Mg Tabs (Tramadol Hcl) .... Use Asd 11)  Azithromycin 250 Mg Tabs (Azithromycin) .... 2po Qd For 1 Day, Then 1po Qd For 4days, Then Stop  Current Medications (verified): 1)  Nexium 40 Mg Cpdr (Esomeprazole Magnesium) .Marland Kitchen.. 1po Once Daily 2)  Estrace 2 Mg Tabs (Estradiol) .... Take 1 Tablet By Mouth Once A Day 3)  Multivitamins   Tabs (Multiple Vitamin) .... Take 1 Tablet By Mouth Once A Day 4)  Caltrate 600+d 600-400 Mg-Unit  Tabs (Calcium Carbonate-Vitamin D) .... Take 1 Tablet By  Mouth Two Times A Day 5)  Alprazolam 0.25 Mg  Tabs (Alprazolam) .Marland Kitchen.. 1 By Mouth Once Daily As Needed 6)  Vitamin D3 1000 Unit  Caps (Cholecalciferol) .... Take 1 Tablet By Mouth Two Times A Day 7)  Fish Oil 1000 Mg  Caps (Omega-3 Fatty Acids) .... 2 By Mouth Once Daily 8)  Vitamin B-12 1000 Mcg Tabs (Cyanocobalamin) .... 2 Po  Daily 9)  Levothyroxine Sodium 75 Mcg Tabs (Levothyroxine Sodium) .Marland Kitchen.. 1 By Mouth Once Daily 10)  Tramadol Hcl 50 Mg Tabs (Tramadol Hcl) .... Use Asd 11)  Clarithromycin 500 Mg Tabs (Clarithromycin) .Marland Kitchen.. 1 By Mouth Two Times A Day 12)  Fluconazole 150 Mg Tabs (Fluconazole) .Marland Kitchen.. 1 By Mouth Every Other Day As Needed  Allergies (verified): 1)  ! 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 , mild ill  Head:  normocephalic and atraumatic.   Eyes:  vision grossly intact, pupils equal, and pupils round.   Ears:  bilat t's mild red, sinus tedner bilat Nose:  nasal dischargemucosal pallor and mucosal edema.   Mouth:  pharyngeal erythema and fair dentition.   Neck:  supple and cervical lymphadenopathy.   Lungs:  normal respiratory effort and normal breath sounds.   Heart:  normal rate and regular rhythm.   Abdomen:  soft and normal bowel sounds.  with mod epigastric tender Extremities:  no edema, no erythema  Psych:  not depressed  appearing and slightly anxious.     Impression & Recommendations:  Problem # 1:  SINUSITIS- ACUTE-NOS (ICD-461.9)  Her updated medication list for this problem includes:    Clarithromycin 500 Mg Tabs (Clarithromycin) .Marland Kitchen... 1 by mouth two times a day treat as above, f/u any worsening signs or symptoms   Problem # 2:  EPIGASTRIC PAIN (ICD-789.06)  with mod tender , s/p ccx - for change PPI to nexium once daily, and check h pylori, lipase  Orders: TLB-H. Pylori Abs(Helicobacter Pylori) (86677-HELICO) TLB-Lipase (83690-LIPASE) TLB-CBC Platelet - w/Differential (85025-CBCD) TLB-Hepatic/Liver Function Pnl (80076-HEPATIC) TLB-BMP (Basic Metabolic Panel-BMET) (80048-METABOL)  Problem # 3:  DEPRESSION (ICD-311)  Her updated medication list for this problem includes:    Alprazolam 0.25 Mg Tabs (Alprazolam) .Marland Kitchen... 1 by mouth once daily as  needed stable overall by hx and exam, ok to continue meds/tx as is -   Complete Medication List: 1)  Nexium 40 Mg Cpdr (Esomeprazole magnesium) .Marland Kitchen.. 1po once daily 2)  Estrace 2 Mg Tabs (Estradiol) .... Take 1 tablet by mouth once a day 3)  Multivitamins Tabs (Multiple vitamin) .... Take 1 tablet by mouth once a day 4)  Caltrate 600+d 600-400 Mg-unit Tabs (Calcium carbonate-vitamin d) .... Take 1 tablet by mouth two times a day 5)  Alprazolam 0.25 Mg Tabs (Alprazolam) .Marland Kitchen.. 1 by mouth once daily as needed 6)  Vitamin D3 1000 Unit Caps (Cholecalciferol) .... Take 1 tablet by mouth two times a day 7)  Fish Oil 1000 Mg Caps (Omega-3 fatty acids) .... 2 by mouth once daily 8)  Vitamin B-12 1000 Mcg Tabs (Cyanocobalamin) .... 2 po  daily 9)  Levothyroxine Sodium 75 Mcg Tabs (Levothyroxine sodium) .Marland Kitchen.. 1 by mouth once daily 10)  Tramadol Hcl 50 Mg Tabs (Tramadol hcl) .... Use asd 11)  Clarithromycin 500 Mg Tabs (Clarithromycin) .Marland Kitchen.. 1 by mouth two times a day 12)  Fluconazole 150 Mg Tabs (Fluconazole) .Marland Kitchen.. 1 by mouth every other day as needed  Patient  Instructions: 1)  Please take all new medications as prescribed - the nexium instead of the omeprazole, and the antibiotic 2)  Please go to the Lab in the basement for your blood  tests today  3)  Continue all previous medications as before this visit  4)  Please schedule a follow-up appointment in Feb 2012 with CPX labs Prescriptions: FLUCONAZOLE 150 MG TABS (FLUCONAZOLE) 1 by mouth every other day as needed  #2 x 1   Entered and Authorized by:   Corwin Levins MD   Signed by:   Corwin Levins MD on 11/01/2009   Method used:   Print then Give to Patient   RxID:   [id number] NEXIUM 40 MG CPDR (ESOMEPRAZOLE MAGNESIUM) 1po once daily  #90 x 3   Entered and Authorized by:   Corwin Levins MD   Signed by:   Corwin Levins MD on 11/01/2009   Method used:   Print then Give to Patient   RxID:   [id number] CLARITHROMYCIN 500 MG TABS (CLARITHROMYCIN) 1 by mouth two times a day  #20 x 0   Entered and Authorized by:   Corwin Levins MD   Signed by:   Corwin Levins MD on 11/01/2009   Method used:   Print then Give to Patient   RxID:   [id number]

## 2010-02-25 NOTE — Progress Notes (Signed)
Summary: letter  Phone Note Call from Patient Call back at [phone]   Caller: Patient Call For: Jezabelle Chisolm Summary of Call: pt need letter to carry on the plane stating that c pap machine is a medical device for sleeping if possible she need it by tomorrow Initial call taken by: Rickard Patience,  February 06, 2008 11:12 AM  Follow-up for Phone Call        Please advise when this is done and we will call pt to come pick up.  thanks Follow-up by: Vernie Murders,  February 06, 2008 11:21 AM  Additional Follow-up for Phone Call Additional follow up Details #1::        done. Additional Follow-up by: Barbaraann Share MD,  February 06, 2008 5:22 PM

## 2010-02-25 NOTE — Assessment & Plan Note (Signed)
Summary: consult for osa   Referred by:  Oliver Barre PCP:  Jonny Ruiz  Chief Complaint:  Sleep Consult.  History of Present Illness: the patient is a very pleasant 50 year old female who I've been asked to see for possible sleep apnea.  She has had a sleep study back in 1990, and was told that she had sleep apnea but not enough to require treatment.  Currently, she is having significant daytime fatigue and sleepiness whenever she becomes inactive.  She's been noted to have significant snoring as well as pauses in her breathing during sleep.  The patient has frequent awakenings at night, and admits to choking arousals at times.  She typically goes to bed between 9 and 10 at night, and arises at 5:15 a.m. to start her day.  She is not rested upon arising in the mornings.  She describes significant tiredness all during the day, and also inappropriate daytime sleepiness.  She will have a sleep pressure when driving.  Overall, the patient states that her weight is down 60 pounds over the last 4 years.  Her Epworth score today in the office is 15.     Current Allergies: ! CODEINE  Past Medical History:    Reviewed history from 06/29/2007 and no changes required:       GERD       Hypothyroidism       migraine       Anxiety       Depression       scoliosis       OSA  Past Surgical History:    Reviewed history from 03/30/2007 and no changes required:       Cholecystectomy - 1980       Hysterectomy       jawbone surgury at 50 yo       Oophorectomy   Family History:    grandmother with osteoporosis    father with IBS    Family History of Arthritis    mother with TIA 2007, hemochromatosis    stroke    HTN    DM    lymphoma        asthma: brother     cancer: mother (basil cell)  Social History:    Reviewed history from 03/30/2007 and no changes required:       Former Smoker       Alcohol use-yes       Married       2 children       housewife   Risk Factors:  Tobacco use:  quit    Year quit:  2000    Pack-years:  less than 1/2 ppd     Comments:  smoked x 8 years  Alcohol use:  yes  Mammogram History:    Date of Last Mammogram:  03/28/2007  PAP Smear History:    Date of Last PAP Smear:  03/28/2007   Review of Systems      See HPI   Vital Signs:  Patient Profile:   50 Years Old Female Weight:      208.50 pounds O2 Sat:      99 % O2 treatment:    Room Air Temp:     97.9 degrees F oral Pulse rate:   62 / minute BP sitting:   122 / 80  (left arm) Cuff size:   large  Vitals Entered By: Cyndia Diver LPN (July 22, 2007 2:37 PM)  Is Patient Diabetic? No Comments Medications reviewed with patient Cyndia Diver LPN  July 22, 2007 2:37 PM      Physical Exam  General:      obese female in no acute distress Eyes:     PERRLA and EOMI.   Nose:     mild narrowing on the left, the right is clear Mouth:     mild elongation of the soft palate and uvula Neck:     no JVD, thyromegaly, or lymphadenopathy. Lungs:     totally clear to auscultation Heart:     regular rate and rhythm, no MRG Abdomen:     soft and nontender, bowel sounds present Extremities:     no significant edema, pulses intact distally Neurologic:     alert and oriented, moves all 4 extremities.      Impression & Recommendations:  Problem # 1:  OBSTRUCTIVE SLEEP APNEA (ICD-327.23) probable obstructive sleep apnea by her history.  She is describing mild sleep apnea on her sleep study back in 1990, and unfortunately those records are not available to [place].  She is having significant daytime fatigue and sleepiness, as well as nonrestorative sleep.  When combined with her abnormal upper airway anatomy, it is likely that she has sleep disordered breathing.  I had a long discussion with her about the pathophysiology of sleep apnea, including its impact on her quality of life and cardiovascular health.  The patient is agreeable to proceeding with a sleep study.  Medications  Added to Medication List This Visit: 1)  Multivitamins Tabs (Multiple vitamin) .... Take 1 tablet by mouth once a day 2)  Caltrate 600+d 600-400 Mg-unit Tabs (Calcium carbonate-vitamin d) .... Take 1 tablet by mouth two times a day 3)  Alprazolam 0.25 Mg Tabs (Alprazolam) .Marland Kitchen.. 1 by mouth once daily as needed 4)  Vitamin D3 1000 Unit Caps (Cholecalciferol) .... Take 1 tablet by mouth two times a day   Patient Instructions: 1)   will set up sleep study, and call when results are available. 2)  work on weight loss   ]

## 2010-02-25 NOTE — Progress Notes (Signed)
Summary: medication not working  Phone Note Call from Patient Call back at [employer] [phone]   Caller: [phone] Call For: Corwin Levins MD Summary of Call: per [facility] call the CEPHALEXIN 500 MG CAPS (CEPHALEXIN) 1 by mouth three times a day  is not working for her requseting amother medication c/o headache sinus drainage ............[facility][name] seening Dr Jonny Ruiz on [place] for her back  [address] [place], [place]  [address] Ph: [phone] Fax: [phone] Initial call taken by: Shelbie Proctor,  February 22, 2008 8:09 AM  Follow-up for Phone Call        ok to change to clarithromycin  - sent escript Follow-up by: Corwin Levins MD,  February 22, 2008 12:15 PM  Additional Follow-up for Phone Call Additional follow up Details #1::        called pt to inform pt made aware ,and was informed Additional Follow-up by: Shelbie Proctor,  February 22, 2008 1:28 PM  New Problems: HYPERLIPIDEMIA (ICD-272.4)   New Problems: HYPERLIPIDEMIA (ICD-272.4) New/Updated Medications: CLARITHROMYCIN 500 MG TABS (CLARITHROMYCIN) 1 by mouth two times a day LEVOTHROID 50 MCG TABS (LEVOTHYROXINE SODIUM) 1 by mouth once daily   Prescriptions: LEVOTHROID 50 MCG TABS (LEVOTHYROXINE SODIUM) 1 by mouth once daily  #90 x 3   Entered and Authorized by:   Corwin Levins MD   Signed by:   Corwin Levins MD on 02/22/2008   Method used:   Electronically to        [facility] (retail)       [address]       [place], [place]  [address]       Ph: [phone]       Fax: [phone]   RxID:   [id number] CLARITHROMYCIN 500 MG TABS (CLARITHROMYCIN) 1 by mouth two times a day  #20 x 0   Entered and Authorized by:   Corwin Levins MD   Signed by:   Corwin Levins MD on 02/22/2008   Method used:   Electronically to        [facility] (retail)       [address]   [place], [place]  [address]       Ph: [phone]       Fax: [phone]   RxID:   [id number]

## 2010-02-25 NOTE — Progress Notes (Signed)
Summary: sleep study  Phone Note Call from Patient Call back at [phone]   Caller: Patient Call For: clance Reason for Call: Talk to Nurse Summary of Call: looking for sleep results...can we check and see if he has read, so pt can make follow up appt. Initial call taken by: Eugene Gavia,  August 29, 2007 9:10 AM  Follow-up for Phone Call        Kc, has read sleep study.  Please make pt an appt with KC this week to discuss results.  Cyndia Diver LPN  August 29, 2007 11:11 AM     Additional Follow-up for Phone Call Additional follow up Details #1::        Appt Scheduled Additional Follow-up by: Vernie Murders,  August 29, 2007 11:15 AM

## 2010-02-25 NOTE — Miscellaneous (Signed)
Summary: optimal pressure 14cm  Clinical Lists Changes  Orders: Added new Referral order of DME Referral (DME) - Signed  auto download shows great compliance, no leak issues, and optimal pressure at 14cm ( max was 17.6, 95%tile 13.2) will send order to increase pressure to 14cm

## 2010-02-25 NOTE — Assessment & Plan Note (Signed)
Summary: SINUS INFECTION/DLO   Vital Signs:  Patient profile:   50 year old female Weight:      209 pounds BMI:     36.57 O2 Sat:      97 % on Room air Temp:     97.8 degrees F oral Pulse rate:   63 / minute BP sitting:   122 / 80  (left arm) Cuff size:   large  Vitals Entered By: Lamar Sprinkles, CMA (December 28, 2009 11:17 AM)  O2 Flow:  Room air CC: Continued sinus pressure & pain, currently on abx   History of Present Illness: CC: continued sinusitis sxs.  seen 5 d ago with 4d h/o sinus congestion and dx sinusitis, treated with levofloxacin as well as hydromet syrup.  taking alleve, as well as mucinex.  Not feeling much better.  cough yellow sputum, congestion, still has HA, now nausea and diarrhea attributes to abx.  Has 4 days left of levofloxacin.  Drinking water and staying hydrated.  Ears feel better.  + mild PNDrip.  Some things are getting better.  Hoarseness better, ST better.  no drooling, no trouble opening/closing mouth, no hoaresness.  lost weight 2/2 decreased appetite.  Now total of 9 days of illness.  Would like something for nuasea.  Gets sinus infections frequently (Q4 mo per patient).  Current Medications (verified): 1)  Nexium 40 Mg Cpdr (Esomeprazole Magnesium) .Marland Kitchen.. 1po Once Daily 2)  Estrace 2 Mg Tabs (Estradiol) .... Take 1 Tablet By Mouth Once A Day 3)  Multivitamins   Tabs (Multiple Vitamin) .... Take 1 Tablet By Mouth Once A Day 4)  Caltrate 600+d 600-400 Mg-Unit  Tabs (Calcium Carbonate-Vitamin D) .... Take 1 Tablet By Mouth Two Times A Day 5)  Alprazolam 0.25 Mg  Tabs (Alprazolam) .Marland Kitchen.. 1 By Mouth Once Daily As Needed 6)  Vitamin D3 1000 Unit  Caps (Cholecalciferol) .... Take 1 Tablet By Mouth Two Times A Day 7)  Fish Oil 1000 Mg  Caps (Omega-3 Fatty Acids) .... 2 By Mouth Once Daily 8)  Vitamin B-12 1000 Mcg Tabs (Cyanocobalamin) .... 2 Po  Daily 9)  Levothyroxine Sodium 75 Mcg Tabs (Levothyroxine Sodium) .Marland Kitchen.. 1 By Mouth Once Daily 10)  Tramadol  Hcl 50 Mg Tabs (Tramadol Hcl) .... Use Asd 11)  Levofloxacin 500 Mg Tabs (Levofloxacin) .Marland Kitchen.. 1po Once Daily 12)  Hydrocodone-Homatropine 5-1.5 Mg/69ml Syrp (Hydrocodone-Homatropine) .Marland Kitchen.. 1 Tsp By Mouth Q 6 Hrs As Needed Cough  Allergies (verified): 1)  ! 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 congested and tired Head:  normocephalic and atraumatic.  + maxillary and frontal sinus tenderness bilat Eyes:  vision grossly intact, pupils equal, and pupils round.   Ears:  TMs clear bilaterally Nose:  + irritated and erythematous turbinates, slight swelling Mouth:  pharyngeal erythema and fair dentition.   Neck:  supple.  no LAD Lungs:  normal respiratory effort and normal breath sounds.   Heart:  normal rate and regular rhythm.   Pulses:  2_+ rad pulses Extremities:  no pedal edema Skin:  Intact without suspicious lesions or rashes   Impression & Recommendations:  Problem # 1:  SINUSITIS- ACUTE-NOS (ICD-461.9) continued sinusitis, slow recovery.  rec continue abx, add  on flonase and saline irrigation.  phenergan for nausea as requested.  mainly supportive care.  red flags to return discussed.  Her updated medication list for this problem includes:    Levofloxacin 500 Mg Tabs (Levofloxacin) .Marland Kitchen... 1po once daily    Hydrocodone-homatropine 5-1.5 Mg/43ml Syrp (Hydrocodone-homatropine) .Marland Kitchen... 1 tsp by mouth q 6 hrs as needed cough    Flonase 50 Mcg/act Susp (Fluticasone propionate) .Marland Kitchen... 2 squirts in each nostril daily  Complete Medication List: 1)  Nexium 40 Mg Cpdr (Esomeprazole magnesium) .Marland Kitchen.. 1po once daily 2)  Estrace 2 Mg Tabs (Estradiol) .... Take 1 tablet  by mouth once a day 3)  Multivitamins Tabs (Multiple vitamin) .... Take 1 tablet by mouth once a day 4)  Caltrate 600+d 600-400 Mg-unit Tabs (Calcium carbonate-vitamin d) .... Take 1 tablet by mouth two times a day 5)  Alprazolam 0.25 Mg Tabs (Alprazolam) .Marland Kitchen.. 1 by mouth once daily as needed 6)  Vitamin D3 1000 Unit Caps (Cholecalciferol) .... Take 1 tablet by mouth two times a day 7)  Fish Oil 1000 Mg Caps (Omega-3 fatty acids) .... 2 by mouth once daily 8)  Vitamin B-12 1000 Mcg Tabs (Cyanocobalamin) .... 2 po  daily 9)  Levothyroxine Sodium 75 Mcg Tabs (Levothyroxine sodium) .Marland Kitchen.. 1 by mouth once daily 10)  Tramadol Hcl 50 Mg Tabs (Tramadol hcl) .... Use asd 11)  Levofloxacin 500 Mg Tabs (Levofloxacin) .Marland Kitchen.. 1po once daily 12)  Hydrocodone-homatropine 5-1.5 Mg/92ml Syrp (Hydrocodone-homatropine) .Marland Kitchen.. 1 tsp by mouth q 6 hrs as needed cough 13)  Flonase 50 Mcg/act Susp (Fluticasone propionate) .... 2 squirts in each nostril daily 14)  Promethazine Hcl 25 Mg Tabs (Promethazine hcl) .... Take one every 6 hours as needed nausea  Patient Instructions: 1)  You still have sinus infection. 2)  Take medicines as prescribed: flonase 2 squirts in each nostril daily. 3)  Take mucinex with plenty of fluid to help mobilize mucous. 4)  Anti inflammatories (advil) for inflammation/congestion. 5)  Use nasal saline spray or neti pot to help drainage of sinuses. 6)  If you start having fevers >101.5, trouble swallowing or breathing, or are worsening instead of improving as expected, you may need to be seen again. 7)  Good to see you today, call clinic with questions.  8)  phenergan for nausea, could be antibiotics Prescriptions: PROMETHAZINE HCL 25 MG TABS (PROMETHAZINE HCL) take one every 6 hours as needed nausea  #20 x 0   Entered and Authorized by:   Eustaquio Boyden  MD   Signed by:   Eustaquio Boyden  MD on 12/28/2009   Method used:   Electronically to        [facility] # [phone]*  (retail)       [address]       [place], [place]  [address]       Ph: [phone]       Fax: [phone]   RxID:   [id number] FLONASE 50 MCG/ACT SUSP (FLUTICASONE PROPIONATE) 2 squirts in each nostril daily  #1 x 1   Entered and Authorized by:   Eustaquio Boyden  MD   Signed by:   Eustaquio Boyden  MD on 12/28/2009   Method used:   Electronically to        [facility] # [phone]* (retail)       [address]       [place], [place]  [address]       Ph: [phone]  Fax: [phone]   RxID:   [id number]    Orders Added: 1)  Est. Patient Level III [30865]

## 2010-02-25 NOTE — Assessment & Plan Note (Signed)
Summary: SINUS /NWS   Vital Signs:  Patient profile:   50 year old female Height:      63.5 inches Weight:      219 pounds BMI:     38.32 O2 Sat:      96 % on Room air Temp:     97.8 degrees F oral Pulse rate:   79 / minute BP sitting:   110 / 70  (left arm) Cuff size:   large  Vitals Entered By: Zella Ball Ewing CMA (AAMA) (August 29, 2009 10:24 AM)  O2 Flow:  Room air CC: Sinus congestion, sore throat, Both ears hurting, headache/RE   Primary Care Provider:  Jonny Ruiz  CC:  Sinus congestion, sore throat, Both ears hurting, and headache/RE.  History of Present Illness: here with acute onset x 3 days facial pain, pressure, fever and greenish d/c, without ST or cough and Pt denies CP, sob, doe, wheezing, orthopnea, pnd, worsening LE edema, palps, dizziness or syncope  Pt denies new neuro symptoms such as headache, facial or extremity weakness  Recnetly exposed to ill family/grandchildren.    Problems Prior to Update: 1)  Back Pain  (ICD-724.5) 2)  Dysuria  (ICD-788.1) 3)  Sinusitis- Acute-nos  (ICD-461.9) 4)  Wheezing  (ICD-786.07) 5)  Menopausal Disorder  (ICD-627.9) 6)  Thoracic/lumbosacral Neuritis/radiculitis Unspec  (ICD-724.4) 7)  Preventive Health Care  (ICD-V70.0) 8)  Hyperlipidemia  (ICD-272.4) 9)  Sinusitis- Acute-nos  (ICD-461.9) 10)  Vertigo  (ICD-780.4) 11)  Sinusitis- Acute-nos  (ICD-461.9) 12)  Fatigue  (ICD-780.79) 13)  Obstructive Sleep Apnea  (ICD-327.23) 14)  Sinusitis- Acute-nos  (ICD-461.9) 15)  Depression  (ICD-311) 16)  Anxiety  (ICD-300.00) 17)  Hypothyroidism  (ICD-244.9) 18)  Preventive Health Care  (ICD-V70.0) 19)  Gerd  (ICD-530.81)  Medications Prior to Update: 1)  Omeprazole 20 Mg Cpdr (Omeprazole) .Marland Kitchen.. 1po Once Daily 2)  Estrace 2 Mg Tabs (Estradiol) .... Take 1 Tablet By Mouth Once A Day 3)  Multivitamins   Tabs (Multiple Vitamin) .... Take 1 Tablet By Mouth Once A Day 4)  Caltrate 600+d 600-400 Mg-Unit  Tabs (Calcium Carbonate-Vitamin D) ....  Take 1 Tablet By Mouth Two Times A Day 5)  Alprazolam 0.25 Mg  Tabs (Alprazolam) .Marland Kitchen.. 1 By Mouth Once Daily As Needed 6)  Vitamin D3 1000 Unit  Caps (Cholecalciferol) .... Take 1 Tablet By Mouth Two Times A Day 7)  Fish Oil 1000 Mg  Caps (Omega-3 Fatty Acids) .... 2 By Mouth Once Daily 8)  Vitamin B-12 1000 Mcg Tabs (Cyanocobalamin) .... 2 Po  Daily 9)  Levothyroxine Sodium 75 Mcg Tabs (Levothyroxine Sodium) .Marland Kitchen.. 1 By Mouth Once Daily 10)  Tramadol Hcl 50 Mg Tabs (Tramadol Hcl) .... Use Asd  Current Medications (verified): 1)  Omeprazole 20 Mg Cpdr (Omeprazole) .Marland Kitchen.. 1po Once Daily 2)  Estrace 2 Mg Tabs (Estradiol) .... Take 1 Tablet By Mouth Once A Day 3)  Multivitamins   Tabs (Multiple Vitamin) .... Take 1 Tablet By Mouth Once A Day 4)  Caltrate 600+d 600-400 Mg-Unit  Tabs (Calcium Carbonate-Vitamin D) .... Take 1 Tablet By Mouth Two Times A Day 5)  Alprazolam 0.25 Mg  Tabs (Alprazolam) .Marland Kitchen.. 1 By Mouth Once Daily As Needed 6)  Vitamin D3 1000 Unit  Caps (Cholecalciferol) .... Take 1 Tablet By Mouth Two Times A Day 7)  Fish Oil 1000 Mg  Caps (Omega-3 Fatty Acids) .... 2 By Mouth Once Daily 8)  Vitamin B-12 1000 Mcg Tabs (Cyanocobalamin) .... 2 Po  Daily  9)  Levothyroxine Sodium 75 Mcg Tabs (Levothyroxine Sodium) .Marland Kitchen.. 1 By Mouth Once Daily 10)  Tramadol Hcl 50 Mg Tabs (Tramadol Hcl) .... Use Asd 11)  Azithromycin 250 Mg Tabs (Azithromycin) .... 2po Qd For 1 Day, Then 1po Qd For 4days, Then Stop  Allergies (verified): 1)  ! 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   Head:  normocephalic and atraumatic.   Eyes:  vision grossly intact, pupils equal, and pupils round.   Ears:  bilat tm's red, sinus tedner bilat max area Nose:  nasal dischargemucosal pallor and mucosal edema.   Mouth:  pharyngeal erythema and fair dentition.   Neck:  supple and cervical lymphadenopathy.   Lungs:  normal respiratory effort and normal breath sounds.   Heart:  normal rate and regular rhythm.   Extremities:  no edema, no erythema    Impression & Recommendations:  Problem # 1:  SINUSITIS- ACUTE-NOS (ICD-461.9)  Her updated medication list for this problem includes:    Azithromycin 250 Mg Tabs (Azithromycin) .Marland Kitchen... 2po qd for 1 day, then 1po qd for 4days, then stop treat as above, f/u any worsening signs or symptoms   Complete Medication List: 1)  Omeprazole 20 Mg Cpdr (Omeprazole) .Marland Kitchen.. 1po once daily 2)  Estrace 2 Mg Tabs (Estradiol) .... Take 1 tablet by mouth once a day 3)  Multivitamins Tabs (Multiple vitamin) .... Take 1 tablet by mouth once a day 4)  Caltrate 600+d 600-400 Mg-unit Tabs (Calcium carbonate-vitamin d) .... Take 1 tablet by mouth two times a day 5)  Alprazolam 0.25 Mg Tabs (Alprazolam) .Marland Kitchen.. 1 by mouth once daily as needed 6)  Vitamin D3 1000 Unit Caps (Cholecalciferol) .... Take 1 tablet by mouth two times a day 7)  Fish Oil 1000 Mg Caps (Omega-3 fatty acids) .... 2 by mouth once daily 8)  Vitamin B-12 1000 Mcg Tabs (Cyanocobalamin) .... 2 po  daily 9)  Levothyroxine Sodium 75 Mcg Tabs (Levothyroxine sodium) .Marland Kitchen.. 1 by mouth once daily 10)  Tramadol Hcl 50 Mg Tabs (Tramadol hcl) .... Use asd 11)  Azithromycin 250 Mg Tabs (Azithromycin) .... 2po qd for 1 day, then 1po qd for 4days, then stop  Patient Instructions: 1)  Please take all new medications as prescribed 2)  Continue all previous medications as before this visit  3)  You can also use  Mucinex OTC or it's generic for congestion  4)  Please schedule a follow-up appointment in 6 months with CPX labs, or sooner if needed Prescriptions: AZITHROMYCIN 250 MG TABS (AZITHROMYCIN) 2po qd for 1 day, then 1po qd for 4days, then stop  #6 x 1   Entered and Authorized by:   Corwin Levins MD   Signed by:   Corwin Levins MD on 08/29/2009   Method used:   Print then Give to Patient   RxID:   [id number]

## 2010-02-25 NOTE — Assessment & Plan Note (Signed)
Summary: sinus inf?  / fluid ears? / cd   Vital Signs:  Patient profile:   50 year old female Height:      63.5 inches Weight:      212.38 pounds BMI:     37.17 O2 Sat:      95 % Temp:     97.1 degrees F oral Pulse rate:   67 / minute BP sitting:   124 / 80  (left arm) Cuff size:   regular  Vitals Entered By: Windell Norfolk (August 08, 2008 3:12 PM) CC: sinus infection   Primary Care Provider:  Jonny Ruiz  CC:  sinus infection.  History of Present Illness: has gone to second opinion for the back with dr elsner (was seeing dr Ethelene Hal); went [place]our[place] total of  3  series of injectnions and the third seemed to have helped to at least a mild degree; also had PT too; here today with facial pain, pressure, fever and greenish d/c for 3 days; denies hyper or hypothyroid symtpoms; no CP, wheezing, sob, doe, orthopnea, pnd or LE edema  Problems Prior to Update: 1)  Menopausal Disorder  (ICD-627.9) 2)  Thoracic/lumbosacral Neuritis/radiculitis Unspec  (ICD-724.4) 3)  Preventive Health Care  (ICD-V70.0) 4)  Hyperlipidemia  (ICD-272.4) 5)  Sinusitis- Acute-nos  (ICD-461.9) 6)  Vertigo  (ICD-780.4) 7)  Sinusitis- Acute-nos  (ICD-461.9) 8)  Fatigue  (ICD-780.79) 9)  Obstructive Sleep Apnea  (ICD-327.23) 10)  Sinusitis- Acute-nos  (ICD-461.9) 11)  Depression  (ICD-311) 12)  Anxiety  (ICD-300.00) 13)  Hypothyroidism  (ICD-244.9) 14)  Preventive Health Care  (ICD-V70.0) 15)  Gerd  (ICD-530.81)  Medications Prior to Update: 1)  Prilosec 20 Mg  Cpdr (Omeprazole) .Marland Kitchen.. 1 By Mouth Qd 2)  Estrace 2 Mg Tabs (Estradiol) .... Take 1 Tablet By Mouth Once A Day 3)  Multivitamins   Tabs (Multiple Vitamin) .... Take 1 Tablet By Mouth Once A Day 4)  Caltrate 600+d 600-400 Mg-Unit  Tabs (Calcium Carbonate-Vitamin D) .... Take 1 Tablet By Mouth Two Times A Day 5)  Alprazolam 0.25 Mg  Tabs (Alprazolam) .Marland Kitchen.. 1 By Mouth Once Daily As Needed 6)  Vitamin D3 1000 Unit  Caps (Cholecalciferol) .... Take 1 Tablet By Mouth  Two Times A Day 7)  Fish Oil 1000 Mg  Caps (Omega-3 Fatty Acids) .Marland Kitchen.. 1 By Mouth Qd 8)  Vitamin B-12 1000 Mcg Tabs (Cyanocobalamin) .... Once Daily 9)  Levothyroxine Sodium 75 Mcg Tabs (Levothyroxine Sodium) .Marland Kitchen.. 1 By Mouth Once Daily 10)  Lyrica 75 Mg Caps (Pregabalin) .Marland Kitchen.. 1 By Mouth Once Daily 11)  Azithromycin 250 Mg Tabs (Azithromycin) .... 2po Qd For 1 Day, Then 1po Qd For 4days, Then Stop  Current Medications (verified): 1)  Prilosec 20 Mg  Cpdr (Omeprazole) .Marland Kitchen.. 1 By Mouth Qd 2)  Estrace 2 Mg Tabs (Estradiol) .... Take 1 Tablet By Mouth Once A Day 3)  Multivitamins   Tabs (Multiple Vitamin) .... Take 1 Tablet By Mouth Once A Day 4)  Caltrate 600+d 600-400 Mg-Unit  Tabs (Calcium Carbonate-Vitamin D) .... Take 1 Tablet By Mouth Two Times A Day 5)  Alprazolam 0.25 Mg  Tabs (Alprazolam) .Marland Kitchen.. 1 By Mouth Once Daily As Needed 6)  Vitamin D3 1000 Unit  Caps (Cholecalciferol) .... Take 1 Tablet By Mouth Two Times A Day 7)  Fish Oil 1000 Mg  Caps (Omega-3 Fatty Acids) .Marland Kitchen.. 1 By Mouth Qd 8)  Vitamin B-12 1000 Mcg Tabs (Cyanocobalamin) .... Once Daily 9)  Levothyroxine Sodium 75 Mcg Tabs (  Levothyroxine Sodium) .Marland Kitchen.. 1 By Mouth Once Daily 10)  Augmentin 875-125 Mg Tabs (Amoxicillin-Pot Clavulanate) .Marland Kitchen.. 1 By Mouth Two Times A Day (Generic)  Allergies (verified): 1)  ! 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  , mild ill  Head:  normocephalic and atraumatic.   Eyes:  vision  grossly intact, pupils equal, and pupils round.   Ears:  bilat tm's red, sinus tender bilat Nose:  no external deformity and no nasal discharge.   Mouth:  no gingival abnormalities and pharynx pink and moist.   Neck:  supple and cervical lymphadenopathy.   Lungs:  normal respiratory effort and normal breath sounds.   Heart:  normal rate and regular rhythm.   Extremities:  no edema, no ulcers    Impression & Recommendations:  Problem # 1:  SINUSITIS- ACUTE-NOS (ICD-461.9)  The following medications were removed from the medication list:    Azithromycin 250 Mg Tabs (Azithromycin) .Marland Kitchen... 2po qd for 1 day, then 1po qd for 4days, then stop Her updated medication list for this problem includes:    Augmentin 875-125 Mg Tabs (Amoxicillin-pot clavulanate) .Marland Kitchen... 1 by mouth two times a day (generic) treat as above, f/u any worsening signs or symptoms   Problem # 2:  HYPOTHYROIDISM (ICD-244.9)  Her updated medication list for this problem includes:    Levothyroxine Sodium 75 Mcg Tabs (Levothyroxine sodium) .Marland Kitchen... 1 by mouth once daily with recent thyroid med change; declines labs today  Complete Medication List: 1)  Prilosec 20 Mg Cpdr (Omeprazole) .Marland Kitchen.. 1 by mouth qd 2)  Estrace 2 Mg Tabs (Estradiol) .... Take 1 tablet by mouth once a day 3)  Multivitamins Tabs (Multiple vitamin) .... Take 1 tablet by mouth once a day 4)  Caltrate 600+d 600-400 Mg-unit Tabs (Calcium carbonate-vitamin d) .... Take 1 tablet by mouth two times a day 5)  Alprazolam 0.25 Mg Tabs (Alprazolam) .Marland Kitchen.. 1 by mouth once daily as needed 6)  Vitamin D3 1000 Unit Caps (Cholecalciferol) .... Take 1 tablet by mouth two times a day 7)  Fish Oil 1000 Mg Caps (Omega-3 fatty acids) .Marland Kitchen.. 1 by mouth qd 8)  Vitamin B-12 1000 Mcg Tabs (Cyanocobalamin) .... Once daily 9)  Levothyroxine Sodium 75 Mcg Tabs (Levothyroxine sodium) .Marland Kitchen.. 1 by mouth once daily 10)  Augmentin 875-125 Mg Tabs (Amoxicillin-pot clavulanate) .Marland Kitchen.. 1 by mouth two  times a day (generic)  Patient Instructions: 1)  Please take all new medications as prescribed 2)  Continue all medications that you may have been taking previously  3)  Please schedule a follow-up appointment in 6 months with CPX labs Prescriptions: AUGMENTIN 875-125 MG TABS (AMOXICILLIN-POT CLAVULANATE) 1 by mouth two times a day (generic)  #20 x 0   Entered and Authorized by:   Corwin Levins MD   Signed by:   Corwin Levins MD on 08/08/2008   Method used:   Print then Give to Patient   RxID:   [id number]

## 2010-02-25 NOTE — Assessment & Plan Note (Signed)
Summary: UTI ?? /[facility]   Vital Signs:  Patient profile:   50 year old female Height:      64 inches Weight:      224 pounds BMI:     38.59 O2 Sat:      99 % on Room air Temp:     97.1 degrees F oral Pulse rate:   72 / minute BP sitting:   110 / 70  (left arm) Cuff size:   large  Vitals Entered ByMarland Kitchen Zella Ball Ewing (February 20, 2009 2:12 PM)  O2 Flow:  Room air CC: UTI/RE   Primary Care Provider:  Jonny Ruiz  CC:  UTI/RE.  History of Present Illness: here with ? uti with 2 to 3 days onset dysuria intermittent with freq and urgency, no hematuria;  no flank pain, n/v, high fever or chills;  has also had lower back spasms incidently at the same time , with pain mild to mod, worse to get up from chair, but not assoc with other bowel or bladder symptoms, or LE pain, numb, or weakness.  No night sweats, wt loss, falls or injury.  Pain better with motrin but not with any certain position such as lying down.  Pain is spasm like it seems.    Problems Prior to Update: 1)  Back Pain  (ICD-724.5) 2)  Dysuria  (ICD-788.1) 3)  Sinusitis- Acute-nos  (ICD-461.9) 4)  Wheezing  (ICD-786.07) 5)  Menopausal Disorder  (ICD-627.9) 6)  Thoracic/lumbosacral Neuritis/radiculitis Unspec  (ICD-724.4) 7)  Preventive Health Care  (ICD-V70.0) 8)  Hyperlipidemia  (ICD-272.4) 9)  Sinusitis- Acute-nos  (ICD-461.9) 10)  Vertigo  (ICD-780.4) 11)  Sinusitis- Acute-nos  (ICD-461.9) 12)  Fatigue  (ICD-780.79) 13)  Obstructive Sleep Apnea  (ICD-327.23) 14)  Sinusitis- Acute-nos  (ICD-461.9) 15)  Depression  (ICD-311) 16)  Anxiety  (ICD-300.00) 17)  Hypothyroidism  (ICD-244.9) 18)  Preventive Health Care  (ICD-V70.0) 19)  Gerd  (ICD-530.81)  Medications Prior to Update: 1)  Prilosec 20 Mg  Cpdr (Omeprazole) .Marland Kitchen.. 1 By Mouth Qd 2)  Estrace 2 Mg Tabs (Estradiol) .... Take 1 Tablet By Mouth Once A Day 3)  Multivitamins   Tabs (Multiple Vitamin) .... Take 1 Tablet By Mouth Once A Day 4)  Caltrate 600+d 600-400 Mg-Unit   Tabs (Calcium Carbonate-Vitamin D) .... Take 1 Tablet By Mouth Two Times A Day 5)  Alprazolam 0.25 Mg  Tabs (Alprazolam) .Marland Kitchen.. 1 By Mouth Once Daily As Needed 6)  Vitamin D3 1000 Unit  Caps (Cholecalciferol) .... Take 1 Tablet By Mouth Two Times A Day 7)  Fish Oil 1000 Mg  Caps (Omega-3 Fatty Acids) .Marland Kitchen.. 1 By Mouth Qd 8)  Vitamin B-12 1000 Mcg Tabs (Cyanocobalamin) .... Once Daily 9)  Levothyroxine Sodium 75 Mcg Tabs (Levothyroxine Sodium) .Marland Kitchen.. 1 By Mouth Once Daily 10)  Cephalexin 500 Mg Caps (Cephalexin) .Marland Kitchen.. 1po Three Times A Day 11)  Hydrocodone-Homatropine 5-1.5 Mg/77ml Syrp (Hydrocodone-Homatropine) .Marland Kitchen.. 1 Tsp By Mouth Q 6 Hrs As Needed Cough 12)  Prednisone 10 Mg Tabs (Prednisone) .... 3po Qd For 3days, Then 2po Qd For 3days, Then 1po Qd For 3days, Then Stop 13)  Promethazine Hcl 25 Mg Tabs (Promethazine Hcl) .Marland Kitchen.. 1po Q 6 Hrs Prn Nausea 14)  Fluconazole 150 Mg Tabs (Fluconazole) .Marland Kitchen.. 1po Q 3 Days As Needed  Current Medications (verified): 1)  Prilosec 20 Mg  Cpdr (Omeprazole) .Marland Kitchen.. 1 By Mouth Qd 2)  Estrace 2 Mg Tabs (Estradiol) .... Take 1 Tablet By Mouth Once A Day 3)  Multivitamins   Tabs (Multiple Vitamin) .... Take 1 Tablet By Mouth Once A Day 4)  Caltrate 600+d 600-400 Mg-Unit  Tabs (Calcium Carbonate-Vitamin D) .... Take 1 Tablet By Mouth Two Times A Day 5)  Alprazolam 0.25 Mg  Tabs (Alprazolam) .Marland Kitchen.. 1 By Mouth Once Daily As Needed 6)  Vitamin D3 1000 Unit  Caps (Cholecalciferol) .... Take 1 Tablet By Mouth Two Times A Day 7)  Fish Oil 1000 Mg  Caps (Omega-3 Fatty Acids) .Marland Kitchen.. 1 By Mouth Qd 8)  Vitamin B-12 1000 Mcg Tabs (Cyanocobalamin) .... Once Daily 9)  Levothyroxine Sodium 75 Mcg Tabs (Levothyroxine Sodium) .Marland Kitchen.. 1 By Mouth Once Daily 10)  Ciprofloxacin Hcl 500 Mg Tabs (Ciprofloxacin Hcl) .Marland Kitchen.. 1 By Mouth Two Times A Day 11)  Flexeril 5 Mg Tabs (Cyclobenzaprine Hcl) .Marland Kitchen.. 1 By Mouth Three Times A Day As Needed 12)  Fluconazole 150 Mg Tabs (Fluconazole) .Marland Kitchen.. 1po Q 3 Days As  Needed 13)  Tramadol Hcl 50 Mg Tabs (Tramadol Hcl) .... Use Asd  Allergies (verified): 1)  ! 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  , mild ill  Head:  normocephalic and atraumatic.   Eyes:  vision grossly intact, pupils equal, and pupils round.   Ears:  R ear normal and L ear normal.   Nose:  no external deformity and no nasal discharge.   Mouth:  no gingival abnormalities and pharynx pink and moist.   Neck:  supple and no masses.   Lungs:  normal respiratory effort and normal breath sounds.   Heart:  normal rate and regular rhythm.   Abdomen:  soft and normal bowel sounds. but with midl low mid abd tender without guarding or rebound Msk:  no midline spine tender or swelling, but with mild to mod left lumbar and upper left buttock tender but no sweling, erythema or rash Extremities:  no edema, no erythema  Neurologic:  strength normal in all extremities and sensation intact to light touch.  , dtr's not done   Impression & Recommendations:  Problem # 1:  DYSURIA (ICD-788.1)  Her updated medication list for this problem includes:    Ciprofloxacin Hcl 500 Mg Tabs (Ciprofloxacin hcl) .Marland Kitchen... 1 by mouth two times a day  Orders: T-Culture, Urine ([id number]) UA Dipstick w/o Micro (manual) (10626) prob UTI by hx , treat as above, f/u any worsening signs or symptoms , to check urine studies  Problem # 2:  BACK PAIN (ICD-724.5)  Her updated medication list for  this problem includes:    Flexeril 5 Mg Tabs (Cyclobenzaprine hcl) .Marland Kitchen... 1 by mouth three times a day as needed    Tramadol Hcl 50 Mg Tabs (Tramadol hcl) ..... Use asd treat as above, f/u any worsening signs or symptoms  - has rx at home , exam c/w MSK strain flare, cant r/o DDD or radiculitis, but doubt malignancy, abscess , trauma  Complete Medication List: 1)  Prilosec 20 Mg Cpdr (Omeprazole) .Marland Kitchen.. 1 by mouth qd 2)  Estrace 2 Mg Tabs (Estradiol) .... Take 1 tablet by mouth once  a day 3)  Multivitamins Tabs (Multiple vitamin) .... Take 1 tablet by mouth once a day 4)  Caltrate 600+d 600-400 Mg-unit Tabs (Calcium carbonate-vitamin d) .... Take 1 tablet by mouth two times a day 5)  Alprazolam 0.25 Mg Tabs (Alprazolam) .Marland Kitchen.. 1 by mouth once daily as needed 6)  Vitamin D3 1000 Unit Caps (Cholecalciferol) .... Take 1 tablet by mouth two times a day 7)  Fish Oil 1000 Mg Caps (Omega-3 fatty acids) .Marland Kitchen.. 1 by mouth qd 8)  Vitamin B-12 1000 Mcg Tabs (Cyanocobalamin) .... Once daily 9)  Levothyroxine Sodium 75 Mcg Tabs (Levothyroxine sodium) .Marland Kitchen.. 1 by mouth once daily 10)  Ciprofloxacin Hcl 500 Mg Tabs (Ciprofloxacin hcl) .Marland Kitchen.. 1 by mouth two times a day 11)  Flexeril 5 Mg Tabs (Cyclobenzaprine hcl) .Marland Kitchen.. 1 by mouth three times a day as needed 12)  Fluconazole 150 Mg Tabs (Fluconazole) .Marland Kitchen.. 1po q 3 days as needed 13)  Tramadol Hcl 50 Mg Tabs (Tramadol hcl) .... Use asd  Patient Instructions: 1)  Please take all new medications as prescribed 2)  Continue all previous medications as before this visit  3)  Please go to the Lab in the basement for your urine tests today  4)  Please schedule a follow-up appointment in 1 month with CPX labs Prescriptions: CIPROFLOXACIN HCL 500 MG TABS (CIPROFLOXACIN HCL) 1 by mouth two times a day  #20 x 0   Entered and Authorized by:   Corwin Levins MD   Signed by:   Corwin Levins MD on 02/20/2009   Method used:   Print then Give to Patient   RxID:    [id number] FLUCONAZOLE 150 MG TABS (FLUCONAZOLE) 1po q 3 days as needed  #2 x 1   Entered and Authorized by:   Corwin Levins MD   Signed by:   Corwin Levins MD on 02/20/2009   Method used:   Print then Give to Patient   RxID:   [id number]   Laboratory Results   Urine Tests    Routine Urinalysis   Color: yellow Appearance: Hazy Glucose: negative   (Normal Range: Negative) Bilirubin: negative   (Normal Range: Negative) Ketone: negative   (Normal Range: Negative) Spec. Gravity: 1.010   (Normal Range: 1.003-1.035) Blood: trace-lysed   (Normal Range: Negative) pH: 6.0   (Normal Range: 5.0-8.0) Protein: negative   (Normal Range: Negative) Urobilinogen: 0.2   (Normal Range: 0-1) Nitrite: negative   (Normal Range: Negative) Leukocyte Esterace: negative   (Normal Range: Negative)

## 2010-02-25 NOTE — Assessment & Plan Note (Signed)
Summary: cold,cough/cd   Vital Signs:  Patient profile:   50 year old female Height:      63 inches Weight:      218 pounds BMI:     38.76 O2 Sat:      97 % on Room air Temp:     98.7 degrees F oral Pulse rate:   70 / minute BP sitting:   110 / 62  (left arm) Cuff size:   regular  Vitals Entered ByZella Ball Ewing (January 08, 2009 3:55 PM)  O2 Flow:  Room air CC: sinus congestion,cough, ear pain,nausea,diarrhea/RE   Primary Care Provider:  Jonny Ruiz  CC:  sinus congestion, cough, ear pain, nausea, and diarrhea/RE.  History of Present Illness: here with 3 to 4 days onset facial pain, pressure, fever and greenish d/c, now complicated in the last day with St, and increased prod cough as well as mild chest tightness and wheeze with mild sob and harder to take a deep breath;  no change in ability to ambulate same distances and speed;  Pt denies CP,  orthopnea, pnd, worsening LE edema, palps, dizziness or syncope   Problems Prior to Update: 1)  Sinusitis- Acute-nos  (ICD-461.9) 2)  Wheezing  (ICD-786.07) 3)  Bronchitis-acute  (ICD-466.0) 4)  Menopausal Disorder  (ICD-627.9) 5)  Thoracic/lumbosacral Neuritis/radiculitis Unspec  (ICD-724.4) 6)  Preventive Health Care  (ICD-V70.0) 7)  Hyperlipidemia  (ICD-272.4) 8)  Sinusitis- Acute-nos  (ICD-461.9) 9)  Vertigo  (ICD-780.4) 10)  Sinusitis- Acute-nos  (ICD-461.9) 11)  Fatigue  (ICD-780.79) 12)  Obstructive Sleep Apnea  (ICD-327.23) 13)  Sinusitis- Acute-nos  (ICD-461.9) 14)  Depression  (ICD-311) 15)  Anxiety  (ICD-300.00) 16)  Hypothyroidism  (ICD-244.9) 17)  Preventive Health Care  (ICD-V70.0) 18)  Gerd  (ICD-530.81)  Medications Prior to Update: 1)  Prilosec 20 Mg  Cpdr (Omeprazole) .Marland Kitchen.. 1 By Mouth Qd 2)  Estrace 2 Mg Tabs (Estradiol) .... Take 1 Tablet By Mouth Once A Day 3)  Multivitamins   Tabs (Multiple Vitamin) .... Take 1 Tablet By Mouth Once A Day 4)  Caltrate 600+d 600-400 Mg-Unit  Tabs (Calcium Carbonate-Vitamin D)  .... Take 1 Tablet By Mouth Two Times A Day 5)  Alprazolam 0.25 Mg  Tabs (Alprazolam) .Marland Kitchen.. 1 By Mouth Once Daily As Needed 6)  Vitamin D3 1000 Unit  Caps (Cholecalciferol) .... Take 1 Tablet By Mouth Two Times A Day 7)  Fish Oil 1000 Mg  Caps (Omega-3 Fatty Acids) .Marland Kitchen.. 1 By Mouth Qd 8)  Vitamin B-12 1000 Mcg Tabs (Cyanocobalamin) .... Once Daily 9)  Levothyroxine Sodium 75 Mcg Tabs (Levothyroxine Sodium) .Marland Kitchen.. 1 By Mouth Once Daily 10)  Augmentin 875-125 Mg Tabs (Amoxicillin-Pot Clavulanate) .Marland Kitchen.. 1 By Mouth Two Times A Day (Generic)  Current Medications (verified): 1)  Prilosec 20 Mg  Cpdr (Omeprazole) .Marland Kitchen.. 1 By Mouth Qd 2)  Estrace 2 Mg Tabs (Estradiol) .... Take 1 Tablet By Mouth Once A Day 3)  Multivitamins   Tabs (Multiple Vitamin) .... Take 1 Tablet By Mouth Once A Day 4)  Caltrate 600+d 600-400 Mg-Unit  Tabs (Calcium Carbonate-Vitamin D) .... Take 1 Tablet By Mouth Two Times A Day 5)  Alprazolam 0.25 Mg  Tabs (Alprazolam) .Marland Kitchen.. 1 By Mouth Once Daily As Needed 6)  Vitamin D3 1000 Unit  Caps (Cholecalciferol) .... Take 1 Tablet By Mouth Two Times A Day 7)  Fish Oil 1000 Mg  Caps (Omega-3 Fatty Acids) .Marland Kitchen.. 1 By Mouth Qd 8)  Vitamin B-12 1000 Mcg Tabs (Cyanocobalamin) .Marland KitchenMarland KitchenMarland Kitchen  Once Daily 9)  Levothyroxine Sodium 75 Mcg Tabs (Levothyroxine Sodium) .Marland Kitchen.. 1 By Mouth Once Daily 10)  Cephalexin 500 Mg Caps (Cephalexin) .Marland Kitchen.. 1po Three Times A Day 11)  Hydrocodone-Homatropine 5-1.5 Mg/34ml Syrp (Hydrocodone-Homatropine) .Marland Kitchen.. 1 Tsp By Mouth Q 6 Hrs As Needed Cough 12)  Prednisone 10 Mg Tabs (Prednisone) .... 3po Qd For 3days, Then 2po Qd For 3days, Then 1po Qd For 3days, Then Stop 13)  Promethazine Hcl 25 Mg Tabs (Promethazine Hcl) .Marland Kitchen.. 1po Q 6 Hrs Prn Nausea 14)  Fluconazole 150 Mg Tabs (Fluconazole) .Marland Kitchen.. 1po Q 3 Days As Needed  Allergies (verified): 1)  ! 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  , mild ill  Head:  normocephalic and atraumatic.   Eyes:  vision grossly intact, pupils equal, and pupils round.   Ears:  bilat tm's red, sinus tender bilat Nose:  no external deformity and no nasal discharge.   Mouth:  pharyngeal erythema and fair dentition.   Neck:  supple and no masses.   Lungs:  normal respiratory effort, R decreased breath sounds, R wheezes, L decreased breath sounds, and L wheezes.   Heart:  normal rate and regular rhythm.   Extremities:  no edema, no erythema    Impression & Recommendations:  Problem # 1:  SINUSITIS- ACUTE-NOS (ICD-461.9)  Her updated medication list for this problem includes:    Cephalexin 500 Mg Caps (Cephalexin) .Marland Kitchen... 1po three times a day    Hydrocodone-homatropine 5-1.5 Mg/57ml Syrp (Hydrocodone-homatropine) .Marland Kitchen... 1 tsp by mouth q 6 hrs as needed cough treat as above, f/u any worsening signs or symptoms   Problem # 2:  WHEEZING (ICD-786.07) for prednisone burst and taper off - most likely related to above  Problem # 3:  BRONCHITIS-ACUTE (ICD-466.0)  Her updated medication list for this problem includes:    Cephalexin 500 Mg Caps (Cephalexin) .Marland Kitchen... 1po three times a day    Hydrocodone-homatropine 5-1.5 Mg/67ml Syrp (Hydrocodone-homatropine) .Marland Kitchen... 1 tsp by mouth q 6 hrs as needed cough treat as above, f/u any worsening signs or symptoms   Complete Medication List: 1)  Prilosec 20 Mg Cpdr (Omeprazole) .Marland Kitchen.. 1 by mouth qd 2)  Estrace 2 Mg  Tabs (Estradiol) .... Take 1 tablet by mouth once a day 3)  Multivitamins Tabs (Multiple vitamin) .... Take 1 tablet by mouth once a day 4)  Caltrate 600+d 600-400 Mg-unit Tabs (Calcium carbonate-vitamin d) .... Take 1 tablet by mouth two times a day 5)  Alprazolam 0.25 Mg Tabs (Alprazolam) .Marland Kitchen.. 1 by mouth once daily as needed 6)  Vitamin D3 1000 Unit Caps (Cholecalciferol) .... Take 1 tablet by mouth two times a day 7)  Fish Oil 1000 Mg Caps (Omega-3 fatty acids) .Marland Kitchen.. 1 by mouth qd 8)  Vitamin B-12 1000 Mcg Tabs (Cyanocobalamin) .... Once daily 9)  Levothyroxine Sodium 75 Mcg Tabs (Levothyroxine sodium) .Marland Kitchen.. 1 by mouth once daily 10)  Cephalexin 500 Mg Caps (Cephalexin) .Marland Kitchen.. 1po three times a day 11)  Hydrocodone-homatropine 5-1.5 Mg/68ml Syrp (Hydrocodone-homatropine) .Marland Kitchen.. 1 tsp by mouth q 6  hrs as needed cough 12)  Prednisone 10 Mg Tabs (Prednisone) .... 3po qd for 3days, then 2po qd for 3days, then 1po qd for 3days, then stop 13)  Promethazine Hcl 25 Mg Tabs (Promethazine hcl) .Marland Kitchen.. 1po q 6 hrs prn nausea 14)  Fluconazole 150 Mg Tabs (Fluconazole) .Marland Kitchen.. 1po q 3 days as needed  Patient Instructions: 1)  Please take all new medications as prescribed 2)  Continue all previous medications as before this visit  3)  Please schedule a follow-up appointment as recommended at your last office visit Prescriptions: FLUCONAZOLE 150 MG TABS (FLUCONAZOLE) 1po q 3 days as needed  #2 x 1   Entered and Authorized by:   Corwin Levins MD   Signed by:   Corwin Levins MD on 01/08/2009   Method used:   Print then Give to Patient   RxID:   [id number] PROMETHAZINE HCL 25 MG TABS (PROMETHAZINE HCL) 1po q 6 hrs prn nausea  #30 x 1   Entered and Authorized by:   Corwin Levins MD   Signed by:   Corwin Levins MD on 01/08/2009   Method used:   Print then Give to Patient   RxID:   [id number] PREDNISONE 10 MG TABS (PREDNISONE) 3po qd for 3days, then 2po qd for 3days, then 1po qd for 3days, then stop  #18 x 0    Entered and Authorized by:   Corwin Levins MD   Signed by:   Corwin Levins MD on 01/08/2009   Method used:   Print then Give to Patient   RxID:   [id number] HYDROCODONE-HOMATROPINE 5-1.5 MG/5ML SYRP (HYDROCODONE-HOMATROPINE) 1 tsp by mouth q 6 hrs as needed cough  #6 oz x 1   Entered and Authorized by:   Corwin Levins MD   Signed by:   Corwin Levins MD on 01/08/2009   Method used:   Print then Give to Patient   RxID:   [id number] CEPHALEXIN 500 MG CAPS (CEPHALEXIN) 1po three times a day  #30 x 0   Entered and Authorized by:   Corwin Levins MD   Signed by:   Corwin Levins MD on 01/08/2009   Method used:   Print then Give to Patient   RxID:   [id number]

## 2010-02-25 NOTE — Assessment & Plan Note (Signed)
Summary: ? sinus infection dd   Vital Signs:  Patient Profile:   50 Years Old Female Weight:      204 pounds Temp:     97.4 degrees F oral Pulse rate:   66 / minute BP sitting:   118 / 74  (right arm) Cuff size:   regular  Pt. in pain?   no  Vitals Entered By: Maris Berger (March 30, 2007 2:41 PM)                  Preventive Care Screening  Bone Density:    Date:  01/29/2004    Next Due:  01/2006    Results:  normal std dev   Chief Complaint:  sinus infection.  History of Present Illness: here with acute onset sinus pain, pressure, fever for several days    Updated Prior Medication List: PRILOSEC 20 MG  CPDR (OMEPRAZOLE) 1 by mouth qd ESTRACE 1 MG  TABS (ESTRADIOL) 1 by mouth qd CEFTIN 250 MG  TABS (CEFUROXIME AXETIL) 1 by mouth bid TUSSIONEX PENNKINETIC ER 8-10 MG/5ML  LQCR (CHLORPHENIRAMINE-HYDROCODONE) 1 tsp by mouth two times a day prn * MULT. VIT 1 by mouth qd * CALIUM W/ VIT D 1 by mouth qd  Current Allergies (reviewed today): ! CODEINE  Past Medical History:    Reviewed history from 01/13/2007 and no changes required:       GERD       Hypothyroidism       migraine       Anxiety       Depression       scoliosis  Past Surgical History:    Reviewed history from 01/13/2007 and no changes required:       Cholecystectomy - 1980       Hysterectomy       jawbone surgury at 50 yo       Oophorectomy   Family History:    Reviewed history from 01/13/2007 and no changes required:       grandmother with osteoporosis       father with IBS       Family History of Arthritis       mother with TIA 2007, hemochromatosis       stroke       HTN       DM       lymphoma  Social History:    Reviewed history from 01/13/2007 and no changes required:       Former Smoker       Alcohol use-yes       Married       2 children       housewife     Physical Exam  General:     mild ill Head:     Normocephalic and atraumatic without obvious  abnormalities. No apparent alopecia or balding. Eyes:     No corneal or conjunctival inflammation noted. EOMI. Perrla.  Ears:     bilat tm's red, sinus tender bilat Nose:     External nasal examination shows no deformity or inflammation. Nasal mucosa are pink and moist without lesions or exudates. Mouth:     pharyngeal erythema.   Neck:     cervical lymphadenopathy.   Lungs:     Normal respiratory effort, chest expands symmetrically. Lungs are clear to auscultation, no crackles or wheezes. Heart:     Normal rate and regular rhythm. S1 and S2 normal without gallop, murmur, click, rub or other  extra sounds. Extremities:     no edema, no ulcers     Impression & Recommendations:  Problem # 1:  SINUSITIS- ACUTE-NOS (ICD-461.9)  Her updated medication list for this problem includes:    Ceftin 250 Mg Tabs (Cefuroxime axetil) .Marland Kitchen... 1 by mouth bid    Tussionex Pennkinetic Er 8-10 Mg/6ml Lqcr (Chlorpheniramine-hydrocodone) .Marland Kitchen... 1 tsp by mouth two times a day prn tx as above   Complete Medication List: 1)  Prilosec 20 Mg Cpdr (Omeprazole) .Marland Kitchen.. 1 by mouth qd 2)  Estrace 1 Mg Tabs (Estradiol) .Marland Kitchen.. 1 by mouth qd 3)  Ceftin 250 Mg Tabs (Cefuroxime axetil) .Marland Kitchen.. 1 by mouth bid 4)  Tussionex Pennkinetic Er 8-10 Mg/40ml Lqcr (Chlorpheniramine-hydrocodone) .Marland Kitchen.. 1 tsp by mouth two times a day prn 5)  Mult. Vit  .Marland KitchenMarland Kitchen. 1 by mouth qd 6)  Calium W/ Vit D  .... 1 by mouth qd   Patient Instructions: 1)  please take your antibiotic as prescribed 2)  Please schedule a follow-up appointment in 3 months with cpx labs    Prescriptions: CEFTIN 250 MG  TABS (CEFUROXIME AXETIL) 1 by mouth bid  #20 x 0   Entered and Authorized by:   Corwin Levins MD   Signed by:   Corwin Levins MD on 03/30/2007   Method used:   Print then Give to Patient   RxID:   [id number]  ]

## 2010-02-25 NOTE — Letter (Signed)
Summary: Follow Up/[facility]  Follow Up/[facility]   Imported By: Sherian Rein 05/03/2008 07:46:26  _____________________________________________________________________  External Attachment:    Type:   Image     Comment:   External Document

## 2010-02-25 NOTE — Progress Notes (Signed)
----   Converted from flag ---- ---- 03/22/2009 6:03 PM, Corwin Levins MD wrote: call pt to give her Lorain Childes -   last colon testing on the chart was by me in 1999  and was only a sigmoidoscopy, so she would be due for colonoscopy for screening next year ------------------------------  called pt informed that colonoscopy screening is due for next year.

## 2010-02-25 NOTE — Assessment & Plan Note (Signed)
Summary: rov for osa   Referred by:  Oliver Barre PCP:  Jonny Ruiz  Chief Complaint:  Pt is here for a follow up visit to discuss sleep study results. .  History of Present Illness: The pt comes in today for f/u of her npsg.  She was found to have an AHI of 23/hr, with desat to 69%. I of the study in great detail with her, and have answered all of her questions.     Current Allergies: ! CODEINE      Vital Signs:  Patient Profile:   50 Years Old Female Weight:      210 pounds O2 Sat:      97 % O2 treatment:    Room Air Temp:     98.0 degrees F oral Pulse rate:   64 / minute BP sitting:   120 / 76  (left arm) Cuff size:   large  Vitals Entered By: Cyndia Diver LPN (September 01, 2007 10:32 AM)             Comments Medications reviewed with patient Cyndia Diver LPN  September 01, 2007 10:32 AM      Physical Exam  General:     overweight female in no acute distress      Impression & Recommendations:  Problem # 1:  OBSTRUCTIVE SLEEP APNEA (ICD-327.23) the patient has been found to have moderate obstructive sleep apnea, with a significant impact on her quality of life by her history. I have gone over the various treatment options with her, including a trial of weight loss alone, oral surgery, dental appliance, and finally CPAP. I have recommended to her a trial of CPAP while working on weight loss. I think this gives her the best opportunity for resolution. The patient is agreeable to this approach   Patient Instructions: 1)  will start on cpap.  Please call if issues with tolerance 2)  work on weight loss 3)  f/u with me in 4wks   ]

## 2010-02-25 NOTE — Assessment & Plan Note (Signed)
Summary: rov for osa   Referred by:  Oliver Barre PCP:  Jonny Ruiz  Chief Complaint:  6 month sleep follow-up.  Pt states using CPAP every night without problems..  History of Present Illness: the pt comes in today for f/u of her osa.  She has been wearing her cpap compliantly at 14cm, and denies any issues with the pressure or mask fit.  She is sleeping well, and denies EDS.  Overall, she is satisfied with her response to therapy.    Prior Medications Reviewed Using: Patient Recall  Updated Prior Medication List: PRILOSEC 20 MG  CPDR (OMEPRAZOLE) 1 by mouth qd ESTRACE 2 MG TABS (ESTRADIOL) Take 1 tablet by mouth once a day MULTIVITAMINS   TABS (MULTIPLE VITAMIN) Take 1 tablet by mouth once a day CALTRATE 600+D 600-400 MG-UNIT  TABS (CALCIUM CARBONATE-VITAMIN D) Take 1 tablet by mouth two times a day ALPRAZOLAM 0.25 MG  TABS (ALPRAZOLAM) 1 by mouth once daily as needed VITAMIN D3 1000 UNIT  CAPS (CHOLECALCIFEROL) Take 1 tablet by mouth two times a day FISH OIL 1000 MG  CAPS (OMEGA-3 FATTY ACIDS) 1 by mouth qd VITAMIN B-12 1000 MCG TABS (CYANOCOBALAMIN) once daily LEVOTHYROXINE SODIUM 75 MCG TABS (LEVOTHYROXINE SODIUM) 1 by mouth once daily  Current Allergies (reviewed today): ! CODEINE  Review of Systems  The patient denies mask leak, pressure sores or abrasions, dry nose and/or throat, difficulty exhaling, difficulty falling asleep, difficulty staying asleep, pulling mask off in the middle of the night, excessive daytime sleepiness, and non restorative sleep.    Vital Signs:  Patient Profile:   50 Years Old Female Weight:      216.25 pounds O2 Sat:      99 % O2 treatment:    Room Air Temp:     98.0 degrees F oral Pulse rate:   58 / minute BP sitting:   104 / 68  (left arm) Cuff size:   regular  Vitals Entered By: Cloyde Reams RN (March 27, 2008 8:54 AM)             Comments Pt is here today for a follow-up visit.  Medications reviewed Cloyde Reams RN  March 27, 2008 8:54 AM      Physical Exam  General:     ow female in nad Nose:     no skin breakdown or pressure necrosis from the cpap mask   Impression & Recommendations:  Problem # 1:  OBSTRUCTIVE SLEEP APNEA (ICD-327.23) the pt is doing well with cpap.  She is wearing the device compliantly, and clearly sees a benefit from treatment.  I have asked her to work on weight loss, and also keep up with mask changes.  F/u will be in one year.  Medications Added to Medication List This Visit: 1)  Estrace 2 Mg Tabs (Estradiol) .... Take 1 tablet by mouth once a day  Patient Instructions: 1)  Please schedule a follow-up appointment in 1 year. 2)  work on weight loss

## 2010-02-25 NOTE — Progress Notes (Signed)
Summary: ? for Dr Jonny Ruiz  Phone Note Call from Patient Call back at Home Phone [phone]   Caller:  [phone] Call For: dr Jonny Ruiz Summary of Call: per pt call want to know if she can travel to the [facility] Sunday feeling better. was seen yesterday for SINUSITIS. Initial call taken by: Shelbie Proctor,  November 24, 2007 9:10 AM  Follow-up for Phone Call        yes, should be fine Follow-up by: Corwin Levins MD,  November 24, 2007 1:10 PM  Additional Follow-up for Phone Call Additional follow up Details #1::        Phone Call Completed, Provider Notified pt was informed pt made aware Additional Follow-up by: Shelbie Proctor,  November 24, 2007 1:22 PM

## 2010-02-25 NOTE — Progress Notes (Signed)
Summary: HA x 3 days  Phone Note Call from Patient   Caller: Patient [phone] Summary of Call: Pt called stating she still has severe sinus HA and is requesting Rx to [facility] to treat Initial call taken by: Margaret Pyle, CMA,  December 25, 2009 8:30 AM  Follow-up for Phone Call        I would cont tx as per last OV, including the tramadol, as well as 2 alleve x 1 now Follow-up by: Corwin Levins MD,  December 25, 2009 9:05 AM  Additional Follow-up for Phone Call Additional follow up Details #1::        Pt informed via VM Additional Follow-up by: Margaret Pyle, CMA,  December 25, 2009 10:57 AM

## 2010-02-25 NOTE — Miscellaneous (Signed)
Summary: BONE DENSITY  Clinical Lists Changes  Orders: Added new Test order of T-Lumbar Vertebral Assessment (77082) - Signed 

## 2010-02-25 NOTE — Assessment & Plan Note (Signed)
Summary: rov for osa   Visit Type:  Follow-up Referred by:  Oliver Barre PCP:  Jonny Ruiz  Chief Complaint:  4 wk followup.  Pt has been using cpap every night.  Sometimes takes off her mask during the night due due itching in her nose.  She is overall sleeping better and feels more rested and but still gets "a little tired".  She states the times when she wakes up to adjust mask she notices some sob.  She averages about 6 to 7 hours of sleep at night..  History of Present Illness: The pt comes in today for f/u of her osa.  She has been on cpap since the last visit, and has seen an improvement in her sleep.  She denies any difficulty with pressure tolerance, and feels the mask is fitting well.  She still has some sleepiness toward the end of the day, but I reminded her the pressure has not been optimized.     Current Allergies: ! CODEINE    Risk Factors:  Tobacco use:  quit    Year quit:  2000    Pack-years:  less than 1/2 ppd    Review of Systems      See HPI   Vital Signs:  Patient Profile:   50 Years Old Female Weight:      217.25 pounds O2 Sat:      99 % O2 treatment:    Room Air Temp:     98.9 degrees F oral Pulse rate:   61 / minute BP sitting:   120 / 70  (left arm) Cuff size:   large  Vitals Entered By: Vernie Murders (September 28, 2007 9:31 AM)                 Physical Exam  General:     overweight female in no acute distress Nose:     no skin breakdown or pressure necrosis from the CPAP mask      Impression & Recommendations:  Problem # 1:  OBSTRUCTIVE SLEEP APNEA (ICD-327.23) The pt is doing well with cpap, and has seen a difference in her sleep and daytime alertness.  She is still having some symptoms during the day, however I explained to her we have not optimized her pressure as of yet.  We will go ahead and set her up on an autotitrating device to optimize her pressure, and I have encouraged her to work on weight loss.  Medications Added to  Medication List This Visit: 1)  Vitamin B-12 1000 Mcg Tabs (Cyanocobalamin) .... Once daily   Patient Instructions: 1)  will get autotitrating device to optimize your pressure.  I will call you with results. 2)  work on weight loss 3)  f/u 6mos.   ]

## 2010-02-25 NOTE — Progress Notes (Signed)
Summary: cefuroxime not working  Phone Note Call from Patient Call back at [employer] [phone]   Caller: Patient/[phone] Call For: dr Jonny Ruiz Summary of Call: per pt saw dr Jonny Ruiz on 03-30-07 was prescribe cefuroime  axetil  does not seem to be working , have a headache every since she started medication ,going  on vacation on thursday need something else  Initial call taken by: Shelbie Proctor,  April 05, 2007 8:21 AM  Follow-up for Phone Call        change to clarithromycin - done hardcopy per emr Follow-up by: Corwin Levins MD,  April 05, 2007 8:28 AM  Additional Follow-up for Phone Call Additional follow up Details #1::        Phone Call Completed, Provider Notified April 05, 2007 8:33 AM  called pt to inform that rx willl be at front desk for pick up spoke with pt Additional Follow-up by: Shelbie Proctor,  April 05, 2007 8:34 AM    New/Updated Medications: CLARITHROMYCIN 500 MG  TABS (CLARITHROMYCIN) 1po bid   Prescriptions: CLARITHROMYCIN 500 MG  TABS (CLARITHROMYCIN) 1po bid  #20 x 0   Entered and Authorized by:   Corwin Levins MD   Signed by:   Corwin Levins MD on 04/05/2007   Method used:   Print then Give to Patient   RxID:   [id number]

## 2010-02-25 NOTE — Assessment & Plan Note (Signed)
Summary: FEELS FATIGUE/$50/JK   Vital Signs:  Patient Profile:   50 Years Old Female Weight:      207 pounds Temp:     98.4 degrees F oral Pulse rate:   67 / minute BP sitting:   124 / 75  (right arm)  Pt. in pain?   no  Vitals Entered By: Maris Berger (June 29, 2007 2:40 PM)                  Preventive Care Screening  Mammogram:    Date:  03/28/2007    Results:  normal   Pap Smear:    Date:  03/28/2007    Results:  normal    Chief Complaint:  fatigue.  History of Present Illness: saw gyn with recent new dx low thyroid; stays tired all the time, now on synthroid 50 since 3 wks;p also has fatigue and daytime sleepiness - was dx with this in the lat e80-'s when she was 260 lbs or so, but lost quite a bit since then; low wt was about 185 - since then gradually increased over 3 to 4 yrs;     Updated Prior Medication List: PRILOSEC 20 MG  CPDR (OMEPRAZOLE) 1 by mouth qd ESTRACE 1 MG  TABS (ESTRADIOL) 1 by mouth qd TUSSIONEX PENNKINETIC ER 8-10 MG/5ML  LQCR (CHLORPHENIRAMINE-HYDROCODONE) 1 tsp by mouth two times a day prn * MULT. VIT 1 by mouth qd * CALIUM W/ VIT D 1 by mouth qd SYNTHROID 50 MCG  TABS (LEVOTHYROXINE SODIUM) 1 by mouth qd ALPRAZOLAM 0.25 MG  TABS (ALPRAZOLAM) 1 by mouth qd * OMAGA 3 1 by mouth qd * VITAMIN D 2000 mg 1 by mouth  qd FISH OIL 1000 MG  CAPS (OMEGA-3 FATTY ACIDS) 1 by mouth qd  Current Allergies (reviewed today): ! CODEINE  Past Medical History:    Reviewed history from 03/30/2007 and no changes required:       GERD       Hypothyroidism       migraine       Anxiety       Depression       scoliosis       OSA  Past Surgical History:    Reviewed history from 03/30/2007 and no changes required:       Cholecystectomy - 1980       Hysterectomy       jawbone surgury at 50 yo       Oophorectomy   Family History:    Reviewed history from 03/30/2007 and no changes required:       grandmother with osteoporosis       father  with IBS       Family History of Arthritis       mother with TIA 2007, hemochromatosis       stroke       HTN       DM       lymphoma  Social History:    Reviewed history from 03/30/2007 and no changes required:       Former Smoker       Alcohol use-yes       Married       2 children       housewife   Risk Factors:  Mammogram History:     Date of Last Mammogram:  03/28/2007    Results:  normal   PAP Smear History:     Date of Last PAP Smear:  03/28/2007    Results:  normal    Review of Systems       The patient complains of weight gain.  The patient denies anorexia, fever, weight loss, vision loss, decreased hearing, hoarseness, chest pain, syncope, dyspnea on exertion, peripheral edema, prolonged cough, headaches, hemoptysis, abdominal pain, melena, hematochezia, severe indigestion/heartburn, hematuria, incontinence, muscle weakness, suspicious skin lesions, transient blindness, difficulty walking, depression, unusual weight change, abnormal bleeding, enlarged lymph nodes, angioedema, and breast masses.         all otherwise negative    Physical Exam  General:     Well-developed,well-nourished,in no acute distress; alert,appropriate and cooperative throughout examination Head:     Normocephalic and atraumatic without obvious abnormalities. No apparent alopecia or balding. Eyes:     No corneal or conjunctival inflammation noted. EOMI. Perrla. Ears:     External ear exam shows no significant lesions or deformities.  Otoscopic examination reveals clear canals, tympanic membranes are intact bilaterally without bulging, retraction, inflammation or discharge. Hearing is grossly normal bilaterally. Nose:     External nasal examination shows no deformity or inflammation. Nasal mucosa are pink and moist without lesions or exudates. Mouth:     Oral mucosa and oropharynx without lesions or exudates.  Teeth in good repair. Neck:     No deformities, masses, or tenderness  noted. Lungs:     Normal respiratory effort, chest expands symmetrically. Lungs are clear to auscultation, no crackles or wheezes. Heart:     Normal rate and regular rhythm. S1 and S2 normal without gallop, murmur, click, rub or other extra sounds. Abdomen:     Bowel sounds positive,abdomen soft and non-tender without masses, organomegaly or hernias noted. Msk:     No deformity or scoliosis noted of thoracic or lumbar spine.   Extremities:     No clubbing, cyanosis, edema, or deformity noted with normal full range of motion of all joints.   Neurologic:     No cranial nerve deficits noted. Station and gait are normal. Plantar reflexes are down-going bilaterally. DTRs are symmetrical throughout. Sensory, motor and coordinative functions appear intact. Psych:     severely anxious.      Impression & Recommendations:  Problem # 1:  FATIGUE (ICD-780.79) exam benign, check labs, suspect possible psych overlay as well Orders: TLB-BMP (Basic Metabolic Panel-BMET) (80048-METABOL) TLB-CBC Platelet - w/Differential (85025-CBCD) TLB-Hepatic/Liver Function Pnl (80076-HEPATIC) TLB-TSH (Thyroid Stimulating Hormone) (84443-TSH)   Problem # 2:  OBSTRUCTIVE SLEEP APNEA (ICD-327.23) due for re-eval - refer pulm Orders: Pulmonary Referral (Pulmonary)   Problem # 3:  HYPOTHYROIDISM (ICD-244.9)  Her updated medication list for this problem includes:    Synthroid 50 Mcg Tabs (Levothyroxine sodium) .Marland Kitchen... 1 by mouth qd  Labs Reviewed: TSH: 3.02 (01/13/2007)    Chol: 195 (01/13/2007)   HDL: 53.9 (01/13/2007)   LDL: 120 (01/13/2007)   TG: 108 (01/13/2007) stable overall by hx and exam, ok to continue meds/tx as is   Complete Medication List: 1)  Prilosec 20 Mg Cpdr (Omeprazole) .Marland Kitchen.. 1 by mouth qd 2)  Estrace 1 Mg Tabs (Estradiol) .Marland Kitchen.. 1 by mouth qd 3)  Tussionex Pennkinetic Er 8-10 Mg/66ml Lqcr (Chlorpheniramine-hydrocodone) .Marland Kitchen.. 1 tsp by mouth two times a day prn 4)  Mult. Vit  .Marland KitchenMarland Kitchen. 1 by mouth  qd 5)  Calium W/ Vit D  .... 1 by mouth qd 6)  Synthroid 50 Mcg Tabs (Levothyroxine sodium) .Marland Kitchen.. 1 by mouth qd 7)  Alprazolam 0.25 Mg Tabs (Alprazolam) .Marland Kitchen.. 1 by mouth qd 8)  Omaga 3  .Marland Kitchen.. 1 by mouth qd 9)  Vitamin D  .... 2000 mg 1 by mouth  qd 10)  Fish Oil 1000 Mg Caps (Omega-3 fatty acids) .Marland Kitchen.. 1 by mouth qd   Patient Instructions: 1)  you will have blood work today 2)  you will be contacted about the referral(s) to: Pulmonary for sleep apnea evaluation 3)  Please schedule a follow-up appointment as needed   ]

## 2010-02-25 NOTE — Assessment & Plan Note (Signed)
Summary: SORE THROAT/NWS   Vital Signs:  Patient profile:   50 year old female Height:      63 inches Weight:      215.13 pounds BMI:     38.25 O2 Sat:      96 % Temp:     97.6 degrees F oral Pulse rate:   64 / minute BP sitting:   136 / 72  (left arm) Cuff size:   regular  Vitals Entered By: Windell Norfolk (Jun 05, 2008 4:36 PM) CC: sore throat x 5 days   Primary Care Provider:  Jonny Ruiz  CC:  sore throat x 5 days.  History of Present Illness: here with severe ST for 5 days with fever with spots on the throat per pt, neck pain and slight cough with headache, but no chills, sob, doe, wheezing, orthopnea, pnd or LE edema  Problems Prior to Update: 1)  Pharyngitis-acute  (ICD-462) 2)  Menopausal Disorder  (ICD-627.9) 3)  Thoracic/lumbosacral Neuritis/radiculitis Unspec  (ICD-724.4) 4)  Preventive Health Care  (ICD-V70.0) 5)  Hyperlipidemia  (ICD-272.4) 6)  Sinusitis- Acute-nos  (ICD-461.9) 7)  Vertigo  (ICD-780.4) 8)  Sinusitis- Acute-nos  (ICD-461.9) 9)  Fatigue  (ICD-780.79) 10)  Obstructive Sleep Apnea  (ICD-327.23) 11)  Sinusitis- Acute-nos  (ICD-461.9) 12)  Depression  (ICD-311) 13)  Anxiety  (ICD-300.00) 14)  Hypothyroidism  (ICD-244.9) 15)  Preventive Health Care  (ICD-V70.0) 16)  Gerd  (ICD-530.81)  Medications Prior to Update: 1)  Prilosec 20 Mg  Cpdr (Omeprazole) .Marland Kitchen.. 1 By Mouth Qd 2)  Estrace 2 Mg Tabs (Estradiol) .... Take 1 Tablet By Mouth Once A Day 3)  Multivitamins   Tabs (Multiple Vitamin) .... Take 1 Tablet By Mouth Once A Day 4)  Caltrate 600+d 600-400 Mg-Unit  Tabs (Calcium Carbonate-Vitamin D) .... Take 1 Tablet By Mouth Two Times A Day 5)  Alprazolam 0.25 Mg  Tabs (Alprazolam) .Marland Kitchen.. 1 By Mouth Once Daily As Needed 6)  Vitamin D3 1000 Unit  Caps (Cholecalciferol) .... Take 1 Tablet By Mouth Two Times A Day 7)  Fish Oil 1000 Mg  Caps (Omega-3 Fatty Acids) .Marland Kitchen.. 1 By Mouth Qd 8)  Vitamin B-12 1000 Mcg Tabs (Cyanocobalamin) .... Once Daily 9)   Levothyroxine Sodium 75 Mcg Tabs (Levothyroxine Sodium) .Marland Kitchen.. 1 By Mouth Once Daily  Current Medications (verified): 1)  Prilosec 20 Mg  Cpdr (Omeprazole) .Marland Kitchen.. 1 By Mouth Qd 2)  Estrace 2 Mg Tabs (Estradiol) .... Take 1 Tablet By Mouth Once A Day 3)  Multivitamins   Tabs (Multiple Vitamin) .... Take 1 Tablet By Mouth Once A Day 4)  Caltrate 600+d 600-400 Mg-Unit  Tabs (Calcium Carbonate-Vitamin D) .... Take 1 Tablet By Mouth Two Times A Day 5)  Alprazolam 0.25 Mg  Tabs (Alprazolam) .Marland Kitchen.. 1 By Mouth Once Daily As Needed 6)  Vitamin D3 1000 Unit  Caps (Cholecalciferol) .... Take 1 Tablet By Mouth Two Times A Day 7)  Fish Oil 1000 Mg  Caps (Omega-3 Fatty Acids) .Marland Kitchen.. 1 By Mouth Qd 8)  Vitamin B-12 1000 Mcg Tabs (Cyanocobalamin) .... Once Daily 9)  Levothyroxine Sodium 75 Mcg Tabs (Levothyroxine Sodium) .Marland Kitchen.. 1 By Mouth Once Daily 10)  Lyrica 75 Mg Caps (Pregabalin) .Marland Kitchen.. 1 By Mouth Once Daily 11)  Azithromycin 250 Mg Tabs (Azithromycin) .... 2po Qd For 1 Day, Then 1po Qd For 4days, Then Stop  Allergies (verified): 1)  ! 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  , mild ill  Head:  normocephalic and atraumatic.   Eyes:  vision grossly intact, pupils equal, and pupils round.   Ears:  R ear normal.  , left TM mild red, non bulging Nose:  nasal dischargemucosal pallor and mucosal erythema.   Mouth:  pharyngeal erythema and pharyngeal exudate.   Neck:  supple and cervical lymphadenopathy.   Lungs:  normal respiratory effort and normal breath sounds.   Heart:  normal rate, regular rhythm, and no gallop.   Extremities:  no edema, no ulcers    Impression & Recommendations:  Problem # 1:  PHARYNGITIS-ACUTE (ICD-462)  Her updated medication list for this problem includes:    Azithromycin 250 Mg Tabs (Azithromycin) .Marland Kitchen... 2po qd for 1 day, then 1po qd for 4days, then stop treat as above, f/u any worsening signs or symptoms   Complete Medication List: 1)  Prilosec 20 Mg Cpdr (Omeprazole) .Marland Kitchen.. 1 by mouth qd 2)  Estrace 2 Mg Tabs (Estradiol) .... Take 1 tablet by mouth once a day 3)  Multivitamins Tabs (Multiple vitamin) .... Take 1 tablet by mouth once a day 4)  Caltrate 600+d 600-400 Mg-unit Tabs (Calcium carbonate-vitamin d) .... Take 1 tablet by mouth two times a day 5)  Alprazolam 0.25 Mg Tabs (Alprazolam) .Marland Kitchen.. 1 by mouth once daily as needed 6)  Vitamin D3 1000 Unit Caps (Cholecalciferol) .... Take 1 tablet by mouth two times a day 7)  Fish Oil 1000 Mg Caps (Omega-3 fatty acids) .Marland Kitchen.. 1 by mouth qd 8)  Vitamin B-12 1000 Mcg Tabs (Cyanocobalamin) .... Once daily 9)  Levothyroxine Sodium 75 Mcg Tabs (Levothyroxine sodium) .Marland Kitchen.. 1 by mouth once daily 10)  Lyrica 75 Mg Caps (Pregabalin) .Marland Kitchen.. 1 by mouth once daily 11)  Azithromycin 250 Mg Tabs (Azithromycin) .... 2po qd for 1 day, then 1po qd for 4days, then stop  Patient Instructions: 1)  Please take all new medications as prescribed 2)  Continue all medications that you may have  been taking previously 3)  You can also use Mucinex OTC for congestion  4)  Please schedule a follow-up appointment as needed.  Prescriptions: AZITHROMYCIN 250 MG TABS (AZITHROMYCIN) 2po qd for 1 day, then 1po qd for 4days, then stop  #6 x 1   Entered and Authorized by:   Corwin Levins MD   Signed by:   Corwin Levins MD on 06/05/2008   Method used:   Print then Give to Patient   RxID:   [id number]

## 2010-02-28 NOTE — Letter (Signed)
Summary: Neurosurgical Consult/[facility]  Neurosurgical Consult/[facility]   Imported By: Sherian Rein 07/06/2008 15:20:22  _____________________________________________________________________  External Attachment:    Type:   Image     Comment:   External Document

## 2010-02-28 NOTE — Letter (Signed)
Summary: [facility]  [facility]   Imported By: Lester Potlatch 05/02/2008 09:12:40  _____________________________________________________________________  External Attachment:    Type:   Image     Comment:   External Document

## 2010-02-28 NOTE — Letter (Signed)
Summary: OV/[facility]  OV/[facility]   Imported By: Sherian Rein 05/04/2008 11:58:31  _____________________________________________________________________  External Attachment:    Type:   Image     Comment:   External Document

## 2010-03-13 ENCOUNTER — Other Ambulatory Visit: Payer: Self-pay | Admitting: Gynecology

## 2010-03-13 DIAGNOSIS — Z1231 Encounter for screening mammogram for malignant neoplasm of breast: Secondary | ICD-10-CM

## 2010-05-12 ENCOUNTER — Other Ambulatory Visit: Payer: Self-pay | Admitting: Women's Health

## 2010-05-12 ENCOUNTER — Other Ambulatory Visit (HOSPITAL_COMMUNITY)
Admission: RE | Admit: 2010-05-12 | Discharge: 2010-05-12 | Disposition: A | Discharge status: Home / Self Care | Payer: BLUE CROSS/BLUE SHIELD | Source: Ambulatory Visit | Attending: Gynecology | Admitting: Gynecology

## 2010-05-12 ENCOUNTER — Encounter (INDEPENDENT_AMBULATORY_CARE_PROVIDER_SITE_OTHER): Payer: BLUE CROSS/BLUE SHIELD | Admitting: Women's Health

## 2010-05-12 DIAGNOSIS — Z124 Encounter for screening for malignant neoplasm of cervix: Secondary | ICD-10-CM | POA: Insufficient documentation

## 2010-05-12 DIAGNOSIS — Z01419 Encounter for gynecological examination (general) (routine) without abnormal findings: Secondary | ICD-10-CM

## 2010-05-19 ENCOUNTER — Other Ambulatory Visit: Payer: Self-pay | Admitting: Gynecology

## 2010-05-19 ENCOUNTER — Ambulatory Visit
Admission: RE | Admit: 2010-05-19 | Discharge: 2010-05-19 | Disposition: A | Discharge status: Home / Self Care | Payer: BLUE CROSS/BLUE SHIELD | Source: Ambulatory Visit | Attending: Gynecology | Admitting: Gynecology

## 2010-05-19 DIAGNOSIS — Z1231 Encounter for screening mammogram for malignant neoplasm of breast: Secondary | ICD-10-CM

## 2010-06-10 NOTE — Procedures (Signed)
NAME:  Kelli Bell, Kelli Bell                 ACCOUNT NO.:  [id number]   MEDICAL RECORD NO.:  [id number]          PATIENT TYPE:  OUT   LOCATION:  SLEEP CENTER                 FACILITY:  [facility]   PHYSICIAN:  Barbaraann Share, MD,FCCPDATE OF BIRTH:  1960/09/13   DATE OF STUDY:  08/11/2007                            NOCTURNAL POLYSOMNOGRAM   REFERRING PHYSICIAN:   LOCATION:  Sleep lab.   REFERRING PHYSICIAN:  Barbaraann Share, MD,FCCP.   INDICATION FOR STUDY:  Hypersomnia with sleep apnea.   EPWORTH SLEEPINESS SCORE:  16.   SLEEP ARCHITECTURE:  The patient had a total sleep time of 288 minutes  with prolonged sleep-onset latency but normal REM-onset latency.  She  had very little slow wave sleep during this study and decreased REM as  well.  Sleep efficiency was decreased at 78%.   RESPIRATORY DATA:  Patient was found to have 70 obstructive apneas and  39 hypopneas for an AHI of 23 events per hour.  The events were much  worse during REM, and there was loud snoring noted throughout.   OXYGEN DATA:  There was O2 desaturation as low as 69% with the patient's  events.   CARDIAC DATA:  Occasional PVCs noted.   MOVEMENT-PARASOMNIA:  Small numbers of leg jerks were noted that were  not clinically significant.   IMPRESSIONS-RECOMMENDATIONS:  1. Moderate obstructive sleep apnea/hypopnea syndrome with an AHI of      23 events per hour and O2 desaturation as low as 69%.  Treatment      for this degree of sleep apnea can include weight loss alone, if      applicable, upper      airway surgery, oral appliance, and also CPAP.  Clinical      correlation is suggested.  2. Occasional premature ventricular contractions but no clinically      significant arrhythmia noted.      Barbaraann Share, MD,FCCP  Diplomate, American Board of Sleep  Medicine  Electronically Signed     [facility]  D:  08/18/2007 19:25:10  T:  08/18/2007 19:55:36  Job:  [id number]

## 2010-07-21 ENCOUNTER — Ambulatory Visit (INDEPENDENT_AMBULATORY_CARE_PROVIDER_SITE_OTHER): Payer: BLUE CROSS/BLUE SHIELD | Admitting: Internal Medicine

## 2010-07-21 ENCOUNTER — Encounter: Payer: Self-pay | Admitting: Internal Medicine

## 2010-07-21 VITALS — BP 122/72 | HR 76 | Temp 98.2°F | Ht 64.0 in | Wt 205.2 lb

## 2010-07-21 DIAGNOSIS — Z0001 Encounter for general adult medical examination with abnormal findings: Secondary | ICD-10-CM | POA: Insufficient documentation

## 2010-07-21 DIAGNOSIS — E039 Hypothyroidism, unspecified: Secondary | ICD-10-CM

## 2010-07-21 DIAGNOSIS — F3289 Other specified depressive episodes: Secondary | ICD-10-CM

## 2010-07-21 DIAGNOSIS — F411 Generalized anxiety disorder: Secondary | ICD-10-CM

## 2010-07-21 DIAGNOSIS — Z0000 Encounter for general adult medical examination without abnormal findings: Secondary | ICD-10-CM

## 2010-07-21 DIAGNOSIS — F329 Major depressive disorder, single episode, unspecified: Secondary | ICD-10-CM

## 2010-07-21 DIAGNOSIS — J019 Acute sinusitis, unspecified: Secondary | ICD-10-CM

## 2010-07-21 MED ORDER — LEVOFLOXACIN 500 MG PO TABS: 500.0000 mg | ORAL_TABLET | Freq: Every day | ORAL | Status: DC

## 2010-07-21 NOTE — Patient Instructions (Addendum)
Take all new medications as prescribed Continue all other medications as before You will be contacted regarding the referral for: colonoscopy Please return in 3 mo with Lab testing done 3-5 days before

## 2010-07-21 NOTE — Assessment & Plan Note (Signed)
Lab Results  Component Value Date   TSH 3.02 03/15/2009   stable overall by hx and exam, most recent data reviewed with pt, and pt to continue medical treatment as before

## 2010-07-21 NOTE — Assessment & Plan Note (Signed)
Not charged today, but she would like a colonoscopy arranged as she is now > 49yo

## 2010-07-21 NOTE — Progress Notes (Signed)
  Subjective:    Patient ID: Kelli Bell, female    DOB: 1960/02/15, 50 y.o.   MRN: [id number]  HPI  Here with 3 days acute onset fever, facial pain, pressure, general weakness and malaise, and greenish d/c, with slight ST, but little to no cough and Pt denies chest pain, increased sob or doe, wheezing, orthopnea, PND, increased LE swelling, palpitations, dizziness or syncope.  Denies worsening depressive symptoms, suicidal ideation, or panic, though has ongoing anxiety, not increased recently.   Denies hyper or hypo thyroid symptoms such as voice, skin or hair change. Past Medical History  Diagnosis Date  . ANXIETY 03/30/2007  . BACK PAIN 02/20/2009  . DEPRESSION 03/30/2007  . Dysuria 02/20/2009  . EPIGASTRIC PAIN 11/01/2009  . FATIGUE 06/29/2007  . GERD 01/13/2007  . HYPERLIPIDEMIA 02/22/2008  . HYPOTHYROIDISM 03/30/2007  . MENOPAUSAL DISORDER 02/23/2008  . OBSTRUCTIVE SLEEP APNEA 06/29/2007  . SINUSITIS- ACUTE-NOS 03/30/2007  . THORACIC/LUMBOSACRAL NEURITIS/RADICULITIS UNSPEC 02/23/2008  . VERTIGO 11/23/2007  . Wheezing 01/08/2009   Past Surgical History  Procedure Date  . Abdominal hysterectomy 2004  . Cholecystectomy 1980  . Jaw bone surgury     50 yo  . Oophorectomy     reports that she has quit smoking. She does not have any smokeless tobacco history on file. She reports that she drinks alcohol. She reports that she does not use illicit drugs. family history includes Arthritis in her other; Asthma in her brother; Cancer in her mother; Diabetes in her mother; Hypertension in her mother; Osteoporosis in her other; Stroke in her mother; and Thyroid disease in her maternal grandmother and mother. Allergies  Allergen Reactions  . Codeine    No current outpatient prescriptions on file prior to visit.   Review of Systems Review of Systems  Constitutional: Negative for diaphoresis and unexpected weight change.  HENT: Negative for drooling and tinnitus.   Eyes: Negative for photophobia and  visual disturbance.  Respiratory: Negative for choking and stridor.   Gastrointestinal: Negative for vomiting and blood in stool.  Genitourinary: Negative for hematuria and decreased urine volume.  Musculoskeletal: Negative for gait problem.  Psychiatric/Behavioral: Negative for decreased concentration. The patient is not hyperactive.       Objective:   Physical Exam BP 122/72  Pulse 76  Temp(Src) 98.2 F (36.8 C) (Oral)  Ht 5\' 4"  (1.626 m)  Wt 205 lb 4 oz (93.101 kg)  BMI 35.23 kg/m2  SpO2 97% Physical Exam  VS noted, mild ill Constitutional: Pt appears well-developed and well-nourished.  HENT: Head: Normocephalic.  Right Ear: External ear normal.  Left Ear: External ear normal.  Bilat tm's mild erythema.  Sinus tender.  Pharynx mild erythema Eyes: Conjunctivae and EOM are normal. Pupils are equal, round, and reactive to light.  Neck: Normal range of motion. Neck supple.  Cardiovascular: Normal rate and regular rhythm.   Pulmonary/Chest: Effort normal and breath sounds normal.  Neurological: Pt is alert. No cranial nerve deficit.  Skin: Skin is warm. No erythema.  Psychiatric: Pt behavior is normal. Thought content normal. 1+ nervous        Assessment & Plan:

## 2010-07-21 NOTE — Assessment & Plan Note (Signed)
Mild to mod, for antibx course,  to f/u any worsening symptoms or concerns 

## 2010-07-21 NOTE — Assessment & Plan Note (Signed)
stable overall by hx and exam, most recent data reviewed with pt, and pt to continue medical treatment as before  Lab Results  Component Value Date   WBC 5.7 11/01/2009   HGB 12.8 11/01/2009   HCT 36.8 11/01/2009   PLT 316.0 11/01/2009   CHOL 232* 03/15/2009   TRIG 286.0* 03/15/2009   HDL 86.80 03/15/2009   LDLDIRECT 102.1 03/15/2009   ALT 13 11/01/2009   AST 19 11/01/2009   NA 139 11/01/2009   K 4.1 11/01/2009   CL 102 11/01/2009   CREATININE 0.6 11/01/2009   BUN 8 11/01/2009   CO2 29 11/01/2009   TSH 3.02 03/15/2009

## 2010-07-21 NOTE — Assessment & Plan Note (Signed)
stable overall by hx and exam, and pt to continue medical treatment as before 

## 2010-07-24 ENCOUNTER — Telehealth: Payer: Self-pay

## 2010-07-24 MED ORDER — CEPHALEXIN 500 MG PO CAPS: 500.0000 mg | ORAL_CAPSULE | Freq: Four times a day (QID) | ORAL | Status: AC

## 2010-07-24 NOTE — Telephone Encounter (Signed)
Done per emr 

## 2010-07-24 NOTE — Telephone Encounter (Signed)
Pt informed of Rx/pharmacy 

## 2010-07-24 NOTE — Telephone Encounter (Signed)
Pt called requesting alternate ABX, pt stays Levaquin is causing headache.

## 2010-08-08 ENCOUNTER — Encounter: Payer: Self-pay | Admitting: Internal Medicine

## 2010-08-29 ENCOUNTER — Ambulatory Visit (AMBULATORY_SURGERY_CENTER): Payer: BC Managed Care – PPO | Admitting: *Deleted

## 2010-08-29 VITALS — Ht 64.0 in | Wt 204.6 lb

## 2010-08-29 DIAGNOSIS — Z1211 Encounter for screening for malignant neoplasm of colon: Secondary | ICD-10-CM

## 2010-08-29 MED ORDER — PEG-KCL-NACL-NASULF-NA ASC-C 100 G PO SOLR: ORAL | Status: DC

## 2010-09-12 ENCOUNTER — Ambulatory Visit (AMBULATORY_SURGERY_CENTER): Payer: BC Managed Care – PPO | Admitting: Internal Medicine

## 2010-09-12 ENCOUNTER — Encounter: Payer: Self-pay | Admitting: Internal Medicine

## 2010-09-12 VITALS — BP 121/74 | HR 59 | Temp 96.8°F | Resp 20 | Ht 64.0 in | Wt 204.0 lb

## 2010-09-12 DIAGNOSIS — Z1211 Encounter for screening for malignant neoplasm of colon: Secondary | ICD-10-CM

## 2010-09-12 MED ORDER — SODIUM CHLORIDE 0.9 % IV SOLN: 500.0000 mL | INTRAVENOUS | Status: DC

## 2010-09-12 NOTE — Patient Instructions (Signed)
Please review discharge instructions (blue and green sheets)  Please read information on high fiber diets

## 2010-09-15 ENCOUNTER — Telehealth: Payer: Self-pay | Admitting: *Deleted

## 2010-09-15 NOTE — Telephone Encounter (Signed)

## 2010-09-16 ENCOUNTER — Other Ambulatory Visit: Payer: Self-pay | Admitting: Internal Medicine

## 2010-09-16 ENCOUNTER — Other Ambulatory Visit (INDEPENDENT_AMBULATORY_CARE_PROVIDER_SITE_OTHER): Payer: BC Managed Care – PPO

## 2010-09-16 DIAGNOSIS — Z0000 Encounter for general adult medical examination without abnormal findings: Secondary | ICD-10-CM

## 2010-09-16 LAB — URINALYSIS, ROUTINE W REFLEX MICROSCOPIC
Bilirubin Urine: NEGATIVE
Hgb urine dipstick: NEGATIVE
Ketones, ur: NEGATIVE
Leukocytes, UA: NEGATIVE
Nitrite: NEGATIVE
Specific Gravity, Urine: >=1.03 (ref 1.000–1.030)
Total Protein, Urine: NEGATIVE
Urine Glucose: NEGATIVE
Urobilinogen, UA: 0.2 (ref 0.0–1.0)
pH: 6 (ref 5.0–8.0)

## 2010-09-16 LAB — LIPID PANEL
Cholesterol: 205 mg/dL — ABNORMAL HIGH (ref 0–200)
HDL: 72.8 mg/dL (ref >39.00)
Total CHOL/HDL Ratio: 3
Triglycerides: 242 mg/dL — ABNORMAL HIGH (ref 0.0–149.0)
VLDL: 48.4 mg/dL — ABNORMAL HIGH (ref 0.0–40.0)

## 2010-09-16 LAB — CBC WITH DIFFERENTIAL/PLATELET
Basophils Absolute: 0 10*3/uL (ref 0.0–0.1)
Basophils Relative: 0.7 % (ref 0.0–3.0)
Eosinophils Absolute: 0.1 10*3/uL (ref 0.0–0.7)
Eosinophils Relative: 2 % (ref 0.0–5.0)
HCT: 36.4 % (ref 36.0–46.0)
Hemoglobin: 12.6 g/dL (ref 12.0–15.0)
Lymphocytes Relative: 37.2 % (ref 12.0–46.0)
Lymphs Abs: 2.2 10*3/uL (ref 0.7–4.0)
MCHC: 34.5 g/dL (ref 30.0–36.0)
MCV: 90.1 fl (ref 78.0–100.0)
Monocytes Absolute: 0.6 10*3/uL (ref 0.1–1.0)
Monocytes Relative: 9.9 % (ref 3.0–12.0)
Neutro Abs: 3 10*3/uL (ref 1.4–7.7)
Neutrophils Relative %: 50.2 % (ref 43.0–77.0)
Platelets: 323 10*3/uL (ref 150.0–400.0)
RBC: 4.05 Mil/uL (ref 3.87–5.11)
RDW: 12.7 % (ref 11.5–14.6)
WBC: 6 10*3/uL (ref 4.5–10.5)

## 2010-09-16 LAB — BASIC METABOLIC PANEL
BUN: 10 mg/dL (ref 6–23)
CO2: 28 mEq/L (ref 19–32)
Calcium: 8.6 mg/dL (ref 8.4–10.5)
Chloride: 103 mEq/L (ref 96–112)
Creatinine, Ser: 0.6 mg/dL (ref 0.4–1.2)
GFR: 116.59 mL/min (ref >60.00)
Glucose, Bld: 85 mg/dL (ref 70–99)
Potassium: 3.7 mEq/L (ref 3.5–5.1)
Sodium: 139 mEq/L (ref 135–145)

## 2010-09-16 LAB — HEPATIC FUNCTION PANEL
ALT: 14 U/L (ref 0–35)
AST: 17 U/L (ref 0–37)
Albumin: 3.5 g/dL (ref 3.5–5.2)
Alkaline Phosphatase: 52 U/L (ref 39–117)
Bilirubin, Direct: 0.1 mg/dL (ref 0.0–0.3)
Total Bilirubin: 0.3 mg/dL (ref 0.3–1.2)
Total Protein: 6.4 g/dL (ref 6.0–8.3)

## 2010-09-16 LAB — LDL CHOLESTEROL, DIRECT: Direct LDL: 98.9 mg/dL

## 2010-09-16 LAB — TSH: TSH: 3.7 u[IU]/mL (ref 0.35–5.50)

## 2010-09-18 ENCOUNTER — Encounter: Payer: Self-pay | Admitting: Internal Medicine

## 2010-09-18 ENCOUNTER — Ambulatory Visit (INDEPENDENT_AMBULATORY_CARE_PROVIDER_SITE_OTHER): Payer: BC Managed Care – PPO | Admitting: Internal Medicine

## 2010-09-18 VITALS — BP 122/82 | HR 64 | Temp 98.4°F | Ht 64.0 in | Wt 204.2 lb

## 2010-09-18 DIAGNOSIS — Z0000 Encounter for general adult medical examination without abnormal findings: Secondary | ICD-10-CM

## 2010-09-18 NOTE — Assessment & Plan Note (Signed)
Overall doing well, age appropriate education and counseling updated, referrals for preventative services and immunizations addressed, dietary and smoking counseling addressed, most recent labs and ECG reviewed.  I have personally reviewed and have noted: 1) the patient's medical and social history 2) The pt's use of alcohol, tobacco, and illicit drugs 3) The patient's current medications and supplements 4) Functional ability including ADL's, fall risk, home safety risk, hearing and visual impairment 5) Diet and physical activities 6) Evidence for depression or mood disorder 7) The patient's height, weight, and BMI have been recorded in the chart I have made referrals, and provided counseling and education based on review of the above Also for shingles shot today

## 2010-09-18 NOTE — Progress Notes (Signed)
Subjective:    Patient ID: Kelli Bell, female    DOB: 06-02-1960, 50 y.o.   MRN: [id number]  HPI  Here for wellness and f/u;  Overall doing ok;  Pt denies CP, worsening SOB, DOE, wheezing, orthopnea, PND, worsening LE edema, palpitations, dizziness or syncope.  Pt denies neurological change such as new Headache, facial or extremity weakness.  Pt denies polydipsia, polyuria, or low sugar symptoms. Pt states overall good compliance with treatment and medications, good tolerability, and trying to follow lower cholesterol diet.  Pt denies worsening depressive symptoms, suicidal ideation or panic. No fever, wt loss, night sweats, loss of appetite, or other constitutional symptoms.  Pt states good ability with ADL's, low fall risk, home safety reviewed and adequate, no significant changes in hearing or vision, and occasionally active with exercise. With [employer] 3 times per wk since feb 2012. Past Medical History  Diagnosis Date  . ANXIETY 03/30/2007  . BACK PAIN 02/20/2009  . DEPRESSION 03/30/2007  . Dysuria 02/20/2009  . EPIGASTRIC PAIN 11/01/2009  . FATIGUE 06/29/2007  . GERD 01/13/2007  . HYPERLIPIDEMIA 02/22/2008  . HYPOTHYROIDISM 03/30/2007  . MENOPAUSAL DISORDER 02/23/2008  . OBSTRUCTIVE SLEEP APNEA 06/29/2007  . SINUSITIS- ACUTE-NOS 03/30/2007  . THORACIC/LUMBOSACRAL NEURITIS/RADICULITIS UNSPEC 02/23/2008  . VERTIGO 11/23/2007  . Wheezing 01/08/2009   Past Surgical History  Procedure Date  . Abdominal hysterectomy 2004  . Cholecystectomy 1980  . Jaw bone surgury     50 yo  . Oophorectomy     reports that she has quit smoking. She has never used smokeless tobacco. She reports that she drinks alcohol. She reports that she does not use illicit drugs. family history includes Arthritis in her other; Asthma in her brother; Cancer in her mother; Colon polyps (age of onset:69) in her father; Diabetes in her mother; Hypertension in her mother; Osteoporosis in her other; Stroke in her mother; and Thyroid  disease in her maternal grandmother and mother.  There is no history of Colon cancer. Allergies  Allergen Reactions  . Codeine Nausea Only   \ Current Outpatient Prescriptions on File Prior to Visit  Medication Sig Dispense Refill  . ALPRAZolam (XANAX) 0.25 MG tablet Take 0.25 mg by mouth daily as needed.        . Calcium Carbonate-Vitamin D (CALTRATE 600+D) 600-400 MG-UNIT per tablet Take 1 tablet by mouth 2 (two) times daily.        . Cholecalciferol (VITAMIN D3) 1000 UNITS CAPS Take by mouth 2 (two) times daily.        . Cyanocobalamin (VITAMIN B 12 PO) Take by mouth 2 (two) times daily.        Marland Kitchen esomeprazole (NEXIUM) 40 MG capsule Take 40 mg by mouth daily.        Marland Kitchen estradiol (ESTRACE) 2 MG tablet Take 2 mg by mouth daily.        . fluticasone (FLONASE) 50 MCG/ACT nasal spray Place 2 sprays into the nose daily.        Marland Kitchen HYDROcodone-homatropine (HYCODAN) 5-1.5 MG/5ML syrup Take by mouth every 6 (six) hours as needed.        Marland Kitchen levothyroxine (SYNTHROID, LEVOTHROID) 75 MCG tablet Take 75 mcg by mouth daily.        . Multiple Vitamin (MULTIVITAMIN) capsule Take 1 capsule by mouth daily.        . Omega-3 Fatty Acids (FISH OIL) 1000 MG CAPS Take by mouth 2 (two) times daily.        . promethazine (PHENERGAN)  25 MG tablet Take 25 mg by mouth every 6 (six) hours as needed.        . traMADol (ULTRAM) 50 MG tablet Take 50 mg by mouth every 6 (six) hours as needed.         Review of Systems Review of Systems  Constitutional: Negative for diaphoresis, activity change, appetite change and unexpected weight change.  HENT: Negative for hearing loss, ear pain, facial swelling, mouth sores and neck stiffness.   Eyes: Negative for pain, redness and visual disturbance.  Respiratory: Negative for shortness of breath and wheezing.   Cardiovascular: Negative for chest pain and palpitations.  Gastrointestinal: Negative for diarrhea, blood in stool, abdominal distention and rectal pain.  Genitourinary:  Negative for hematuria, flank pain and decreased urine volume.  Musculoskeletal: Negative for myalgias and joint swelling.  Skin: Negative for color change and wound.  Neurological: Negative for syncope and numbness.  Hematological: Negative for adenopathy.  Psychiatric/Behavioral: Negative for hallucinations, self-injury, decreased concentration and agitation.      Objective:   Physical Exam BP 122/82  Pulse 64  Temp(Src) 98.4 F (36.9 C) (Oral)  Ht 5\' 4"  (1.626 m)  Wt 204 lb 4 oz (92.647 kg)  BMI 35.06 kg/m2  SpO2 96% Physical Exam  VS noted Constitutional: Pt is oriented to person, place, and time. Appears well-developed and well-nourished.  HENT:  Head: Normocephalic and atraumatic.  Right Ear: External ear normal.  Left Ear: External ear normal.  Nose: Nose normal.  Mouth/Throat: Oropharynx is clear and moist.  Eyes: Conjunctivae and EOM are normal. Pupils are equal, round, and reactive to light.  Neck: Normal range of motion. Neck supple. No JVD present. No tracheal deviation present.  Cardiovascular: Normal rate, regular rhythm, normal heart sounds and intact distal pulses.   Pulmonary/Chest: Effort normal and breath sounds normal.  Abdominal: Soft. Bowel sounds are normal. There is no tenderness.  Musculoskeletal: Normal range of motion. Exhibits no edema.  Lymphadenopathy:  Has no cervical adenopathy.  Neurological: Pt is alert and oriented to person, place, and time. Pt has normal reflexes. No cranial nerve deficit.  Skin: Skin is warm and dry. No rash noted.  Psychiatric:  Has  normal mood and affect. Behavior is normal.         Assessment & Plan:

## 2010-09-18 NOTE — Patient Instructions (Addendum)
We did not have the shingles shot in stock today; if you would, please call [phone] in 1-2 wks to see if it is in stock (for nurse visit to have done) Continue all other medications as before Please have the pharmacy call if you need refills Please return in 1 year for your yearly visit, or sooner if needed, with Lab testing done 3-5 days before

## 2010-09-23 ENCOUNTER — Other Ambulatory Visit: Payer: Self-pay

## 2010-09-24 MED ORDER — ALPRAZOLAM 0.25 MG PO TABS: 0.2500 mg | ORAL_TABLET | Freq: Every day | ORAL | Status: DC | PRN

## 2010-09-24 NOTE — Telephone Encounter (Signed)
FAXED RX TO [facility]. NOTIFIED.

## 2010-10-27 ENCOUNTER — Other Ambulatory Visit: Payer: Self-pay | Admitting: Internal Medicine

## 2010-11-03 ENCOUNTER — Other Ambulatory Visit: Payer: Self-pay | Admitting: *Deleted

## 2010-11-03 ENCOUNTER — Other Ambulatory Visit: Payer: Self-pay | Admitting: Internal Medicine

## 2010-11-03 MED ORDER — ALPRAZOLAM 0.25 MG PO TABS: 0.2500 mg | ORAL_TABLET | Freq: Every day | ORAL | Status: DC | PRN

## 2010-11-04 NOTE — Telephone Encounter (Signed)
rx called in

## 2010-11-13 ENCOUNTER — Ambulatory Visit (INDEPENDENT_AMBULATORY_CARE_PROVIDER_SITE_OTHER): Payer: BC Managed Care – PPO | Admitting: Internal Medicine

## 2010-11-13 ENCOUNTER — Encounter: Payer: Self-pay | Admitting: Internal Medicine

## 2010-11-13 VITALS — BP 120/78 | HR 70 | Temp 97.9°F | Ht 64.0 in | Wt 209.8 lb

## 2010-11-13 DIAGNOSIS — J019 Acute sinusitis, unspecified: Secondary | ICD-10-CM

## 2010-11-13 DIAGNOSIS — F411 Generalized anxiety disorder: Secondary | ICD-10-CM

## 2010-11-13 DIAGNOSIS — E039 Hypothyroidism, unspecified: Secondary | ICD-10-CM

## 2010-11-13 MED ORDER — LEVOFLOXACIN 250 MG PO TABS: 250.0000 mg | ORAL_TABLET | Freq: Every day | ORAL | Status: AC

## 2010-11-13 NOTE — Patient Instructions (Addendum)
Take all new medications as prescribed Continue all other medications as before  

## 2010-11-13 NOTE — Progress Notes (Signed)
  Subjective:    Patient ID: Kelli Bell, female    DOB: May 16, 1960, 50 y.o.   MRN: [id number]  HPI   Here with 3 days acute onset fever, facial pain, pressure, general weakness and malaise, and greenish d/c, with slight ST, but little to no cough and Pt denies chest pain, increased sob or doe, wheezing, orthopnea, PND, increased LE swelling, palpitations, dizziness or syncope. Pt denies new neurological symptoms such as new headache, or facial or extremity weakness or numbness   Pt denies polydipsia, polyuria.  Denies worsening depressive symptoms, suicidal ideation, or panic, though has ongoing anxiety, not increased recently.   Denies hyper or hypo thyroid symptoms such as voice, skin or hair change.  Past Medical History  Diagnosis Date  . ANXIETY 03/30/2007  . BACK PAIN 02/20/2009  . DEPRESSION 03/30/2007  . Dysuria 02/20/2009  . EPIGASTRIC PAIN 11/01/2009  . FATIGUE 06/29/2007  . GERD 01/13/2007  . HYPERLIPIDEMIA 02/22/2008  . HYPOTHYROIDISM 03/30/2007  . MENOPAUSAL DISORDER 02/23/2008  . OBSTRUCTIVE SLEEP APNEA 06/29/2007  . SINUSITIS- ACUTE-NOS 03/30/2007  . THORACIC/LUMBOSACRAL NEURITIS/RADICULITIS UNSPEC 02/23/2008  . VERTIGO 11/23/2007  . Wheezing 01/08/2009   Past Surgical History  Procedure Date  . Abdominal hysterectomy 2004  . Cholecystectomy 1980  . Jaw bone surgury     50 yo  . Oophorectomy     reports that she has quit smoking. She has never used smokeless tobacco. She reports that she drinks alcohol. She reports that she does not use illicit drugs. family history includes Arthritis in her other; Asthma in her brother; Cancer in her mother; Colon polyps (age of onset:69) in her father; Diabetes in her mother; Hypertension in her mother; Osteoporosis in her other; Stroke in her mother; and Thyroid disease in her maternal grandmother and mother.  There is no history of Colon cancer. Allergies  Allergen Reactions  . Codeine Nausea Only   Review of Systems Review of Systems    Constitutional: Negative for diaphoresis and unexpected weight change.  HENT: Negative for drooling and tinnitus.   Eyes: Negative for photophobia and visual disturbance.  Respiratory: Negative for choking and stridor.   Gastrointestinal: Negative for vomiting and blood in stool.  Genitourinary: Negative for hematuria and decreased urine volume.      Objective:   Physical Exam BP 120/78  Pulse 70  Temp(Src) 97.9 F (36.6 C) (Oral)  Ht 5\' 4"  (1.626 m)  Wt 209 lb 12.8 oz (95.165 kg)  BMI 36.01 kg/m2  SpO2 99% Physical Exam  VS noted, mild ill Constitutional: Pt appears well-developed and well-nourished.  HENT: Head: Normocephalic.  Right Ear: External ear normal.  Left Ear: External ear normal.  Bilat tm's mild erythema.  Sinus tender bilat.  Pharynx mild erythema Eyes: Conjunctivae and EOM are normal. Pupils are equal, round, and reactive to light.  Neck: Normal range of motion. Neck supple.  Cardiovascular: Normal rate and regular rhythm.   Pulmonary/Chest: Effort normal and breath sounds normal.  Neurological: Pt is alert. No cranial nerve deficit.  Skin: Skin is warm. No erythema.  Psychiatric: Pt behavior is normal. Thought content normal.     Assessment & Plan:

## 2010-11-14 ENCOUNTER — Encounter: Payer: Self-pay | Admitting: Internal Medicine

## 2010-11-14 NOTE — Assessment & Plan Note (Signed)
Mild to mod, for antibx course,  to f/u any worsening symptoms or concerns 

## 2010-11-14 NOTE — Assessment & Plan Note (Signed)
stable overall by hx and exam, most recent data reviewed with pt, and pt to continue medical treatment as before  Lab Results  Component Value Date   WBC 6.0 09/16/2010   HGB 12.6 09/16/2010   HCT 36.4 09/16/2010   PLT 323.0 09/16/2010   GLUCOSE 85 09/16/2010   CHOL 205* 09/16/2010   TRIG 242.0* 09/16/2010   HDL 72.80 09/16/2010   LDLDIRECT 98.9 09/16/2010   LDLCALC 120* 01/13/2007   ALT 14 09/16/2010   AST 17 09/16/2010   NA 139 09/16/2010   K 3.7 09/16/2010   CL 103 09/16/2010   CREATININE 0.6 09/16/2010   BUN 10 09/16/2010   CO2 28 09/16/2010   TSH 3.70 09/16/2010

## 2010-11-14 NOTE — Assessment & Plan Note (Signed)
stable overall by hx and exam, most recent data reviewed with pt, and pt to continue medical treatment as before  Lab Results  Component Value Date   TSH 3.70 09/16/2010

## 2010-12-11 ENCOUNTER — Other Ambulatory Visit: Payer: Self-pay | Admitting: *Deleted

## 2010-12-11 MED ORDER — ALPRAZOLAM 0.25 MG PO TABS: 0.2500 mg | ORAL_TABLET | Freq: Every day | ORAL | Status: DC | PRN

## 2010-12-11 NOTE — Telephone Encounter (Signed)
Rx called in to pharmacy. 

## 2011-01-14 ENCOUNTER — Other Ambulatory Visit: Payer: Self-pay | Admitting: *Deleted

## 2011-01-14 MED ORDER — ALPRAZOLAM 0.25 MG PO TABS: 0.2500 mg | ORAL_TABLET | Freq: Every day | ORAL | Status: DC | PRN

## 2011-01-14 NOTE — Telephone Encounter (Signed)
rx called in

## 2011-02-05 ENCOUNTER — Encounter: Payer: Self-pay | Admitting: Internal Medicine

## 2011-02-05 ENCOUNTER — Ambulatory Visit (INDEPENDENT_AMBULATORY_CARE_PROVIDER_SITE_OTHER): Payer: BC Managed Care – PPO | Admitting: Internal Medicine

## 2011-02-05 VITALS — BP 118/64 | HR 64 | Temp 97.3°F | Resp 16 | Wt 211.0 lb

## 2011-02-05 DIAGNOSIS — J019 Acute sinusitis, unspecified: Secondary | ICD-10-CM

## 2011-02-05 MED ORDER — AMOXICILLIN-POT CLAVULANATE 500-125 MG PO TABS: 1.0000 | ORAL_TABLET | Freq: Three times a day (TID) | ORAL | Status: AC

## 2011-02-05 NOTE — Progress Notes (Signed)
  Subjective:    Patient ID: Kelli Bell, female    DOB: 11-Jun-1960, 51 y.o.   MRN: [id number]  Sinusitis This is a recurrent problem. The current episode started in the past 7 days. The problem has been gradually worsening since onset. The maximum temperature recorded prior to her arrival was 100 - 100.9 F. Her pain is at a severity of 2/10. She is experiencing no pain. Associated symptoms include chills, sinus pressure, sneezing and a sore throat. Pertinent negatives include no congestion, coughing, diaphoresis, ear pain, headaches, hoarse voice, neck pain, shortness of breath or swollen glands. Past treatments include spray decongestants. The treatment provided mild relief.      Review of Systems  Constitutional: Positive for chills. Negative for fever, diaphoresis, activity change, appetite change, fatigue and unexpected weight change.  HENT: Positive for sore throat, sneezing and sinus pressure. Negative for hearing loss, ear pain, nosebleeds, congestion, hoarse voice, facial swelling, rhinorrhea, drooling, mouth sores, trouble swallowing, neck pain, neck stiffness, dental problem, voice change, postnasal drip, tinnitus and ear discharge.   Eyes: Negative.   Respiratory: Negative for cough and shortness of breath.   Cardiovascular: Negative for chest pain, palpitations and leg swelling.  Gastrointestinal: Negative.   Genitourinary: Negative.   Musculoskeletal: Negative.   Skin: Negative.   Neurological: Negative for dizziness, tremors, seizures, syncope, facial asymmetry, speech difficulty, weakness, light-headedness, numbness and headaches.  Hematological: Negative for adenopathy. Does not bruise/bleed easily.  Psychiatric/Behavioral: Negative.        Objective:   Physical Exam  Vitals reviewed. Constitutional: She is oriented to person, place, and time. She appears well-developed and well-nourished. No distress.  HENT:  Head: No trismus in the jaw.  Right Ear: Hearing, tympanic  membrane, external ear and ear canal normal.  Left Ear: Hearing, external ear and ear canal normal.  Nose: Mucosal edema and rhinorrhea present. No nose lacerations, sinus tenderness, nasal deformity, septal deviation or nasal septal hematoma. No epistaxis.  No foreign bodies. Right sinus exhibits maxillary sinus tenderness. Right sinus exhibits no frontal sinus tenderness. Left sinus exhibits maxillary sinus tenderness. Left sinus exhibits no frontal sinus tenderness.  Mouth/Throat: Oropharynx is clear and moist and mucous membranes are normal. Mucous membranes are not pale, not dry and not cyanotic. No uvula swelling. No oropharyngeal exudate, posterior oropharyngeal edema, posterior oropharyngeal erythema or tonsillar abscesses.  Eyes: Conjunctivae are normal. Right eye exhibits no discharge. Left eye exhibits no discharge. No scleral icterus.  Neck: Normal range of motion. Neck supple. No JVD present. No tracheal deviation present. No thyromegaly present.  Cardiovascular: Normal rate, regular rhythm, normal heart sounds and intact distal pulses.  Exam reveals no gallop and no friction rub.   No murmur heard. Pulmonary/Chest: Effort normal and breath sounds normal. No stridor. No respiratory distress. She has no wheezes. She has no rales. She exhibits no tenderness.  Abdominal: Soft. Bowel sounds are normal. She exhibits no distension. There is no tenderness. There is no rebound and no guarding.  Musculoskeletal: Normal range of motion. She exhibits no edema and no tenderness.  Lymphadenopathy:    She has no cervical adenopathy.  Neurological: She is oriented to person, place, and time.  Skin: Skin is warm and dry. No rash noted. She is not diaphoretic. No erythema. No pallor.  Psychiatric: She has a normal mood and affect. Her behavior is normal. Judgment and thought content normal.          Assessment & Plan:

## 2011-02-05 NOTE — Patient Instructions (Signed)

## 2011-02-05 NOTE — Assessment & Plan Note (Signed)
Start augmentin and pt ed material was given as well

## 2011-02-23 ENCOUNTER — Other Ambulatory Visit: Payer: Self-pay | Admitting: *Deleted

## 2011-02-23 MED ORDER — ALPRAZOLAM 0.25 MG PO TABS: 0.2500 mg | ORAL_TABLET | Freq: Every day | ORAL | Status: DC | PRN

## 2011-02-24 NOTE — Telephone Encounter (Signed)
rx called in

## 2011-03-05 ENCOUNTER — Encounter: Payer: Self-pay | Admitting: Internal Medicine

## 2011-03-05 ENCOUNTER — Ambulatory Visit (INDEPENDENT_AMBULATORY_CARE_PROVIDER_SITE_OTHER): Payer: BC Managed Care – PPO | Admitting: Internal Medicine

## 2011-03-05 VITALS — BP 120/70 | HR 62 | Temp 97.8°F | Ht 64.0 in | Wt 213.0 lb

## 2011-03-05 DIAGNOSIS — H6690 Otitis media, unspecified, unspecified ear: Secondary | ICD-10-CM

## 2011-03-05 DIAGNOSIS — H6692 Otitis media, unspecified, left ear: Secondary | ICD-10-CM | POA: Insufficient documentation

## 2011-03-05 MED ORDER — LEVOFLOXACIN 250 MG PO TABS: 250.0000 mg | ORAL_TABLET | Freq: Every day | ORAL | Status: AC

## 2011-03-05 NOTE — Progress Notes (Signed)
Subjective:    Patient ID: Kelli Bell, female    DOB: 21-Jul-1960, 51 y.o.   MRN: [id number]  HPI   Here with 3 days acute onset fever, left ear pain, pressure, general weakness and malaise,with slight ST, but little to no cough and Pt denies chest pain, increased sob or doe, wheezing, orthopnea, PND, increased LE swelling, palpitations, dizziness or syncope.  Pt denies new neurological symptoms such as new headache, or facial or extremity weakness or numbness   Pt denies polydipsia, polyuria. Past Medical History  Diagnosis Date  . ANXIETY 03/30/2007  . BACK PAIN 02/20/2009  . DEPRESSION 03/30/2007  . Dysuria 02/20/2009  . EPIGASTRIC PAIN 11/01/2009  . FATIGUE 06/29/2007  . GERD 01/13/2007  . HYPERLIPIDEMIA 02/22/2008  . HYPOTHYROIDISM 03/30/2007  . MENOPAUSAL DISORDER 02/23/2008  . OBSTRUCTIVE SLEEP APNEA 06/29/2007  . SINUSITIS- ACUTE-NOS 03/30/2007  . THORACIC/LUMBOSACRAL NEURITIS/RADICULITIS UNSPEC 02/23/2008  . VERTIGO 11/23/2007  . Wheezing 01/08/2009   Past Surgical History  Procedure Date  . Abdominal hysterectomy 2004  . Cholecystectomy 1980  . Jaw bone surgury     51 yo  . Oophorectomy     reports that she has quit smoking. She has never used smokeless tobacco. She reports that she does not drink alcohol or use illicit drugs. family history includes Arthritis in her other; Asthma in her brother; Cancer in her mother; Colon polyps (age of onset:69) in her father; Diabetes in her mother; Hypertension in her mother; Osteoporosis in her other; Stroke in her mother; and Thyroid disease in her maternal grandmother and mother.  There is no history of Colon cancer. Allergies  Allergen Reactions  . Codeine Nausea Only   Current Outpatient Prescriptions on File Prior to Visit  Medication Sig Dispense Refill  . ALPRAZolam (XANAX) 0.25 MG tablet Take 1 tablet (0.25 mg total) by mouth daily as needed.  30 tablet  0  . Calcium Carbonate-Vitamin D (CALTRATE 600+D) 600-400 MG-UNIT per tablet Take 1  tablet by mouth 2 (two) times daily.        . Cholecalciferol (VITAMIN D3) 1000 UNITS CAPS Take by mouth 2 (two) times daily.        . Cyanocobalamin (VITAMIN B 12 PO) Take by mouth 2 (two) times daily.        Marland Kitchen estradiol (ESTRACE) 2 MG tablet Take 2 mg by mouth daily.        . fluticasone (FLONASE) 50 MCG/ACT nasal spray Place 2 sprays into the nose daily.        Marland Kitchen HYDROcodone-homatropine (HYCODAN) 5-1.5 MG/5ML syrup Take by mouth every 6 (six) hours as needed.        Marland Kitchen levothyroxine (SYNTHROID, LEVOTHROID) 75 MCG tablet TAKE ONE TABLET BY MOUTH ONE TIME DAILY  90 tablet  3  . Multiple Vitamin (MULTIVITAMIN) capsule Take 1 capsule by mouth daily.        Marland Kitchen NEXIUM 40 MG capsule TAKE ONE CAPSULE BY MOUTH ONE TIME DAILY  30 each  6  . Omega-3 Fatty Acids (FISH OIL) 1000 MG CAPS Take by mouth 2 (two) times daily.        . promethazine (PHENERGAN) 25 MG tablet Take 25 mg by mouth every 6 (six) hours as needed.        . traMADol (ULTRAM) 50 MG tablet Take 50 mg by mouth every 6 (six) hours as needed.            Review of Systems Review of Systems  Constitutional: Negative for diaphoresis and  unexpected weight change.  HENT: Negative for drooling and tinnitus.   Eyes: Negative for photophobia and visual disturbance.  Respiratory: Negative for choking and stridor.   Gastrointestinal: Negative for vomiting and blood in stool.  Genitourinary: Negative for hematuria and decreased urine volume.    Objective:   Physical Exam BP 120/70  Pulse 62  Temp(Src) 97.8 F (36.6 C) (Oral)  Ht 5\' 4"  (1.626 m)  Wt 213 lb (96.616 kg)  BMI 36.56 kg/m2  SpO2 99% Physical Exam  VS noted, mild ill Constitutional: Pt appears well-developed and well-nourished.  HENT: Head: Normocephalic.  Right Ear: External ear normal.  Left Ear: External ear normal. .  Sinus nontender.  Pharynx mild erythema Left TM severe erythema, mild bulging  Right TM clear Eyes: Conjunctivae and EOM are normal. Pupils are equal, round,  and reactive to light.  Neck: Normal range of motion. Neck supple.  Cardiovascular: Normal rate and regular rhythm.   Pulmonary/Chest: Effort normal and breath sounds normal.  Neurological: Pt is alert. No cranial nerve deficit.  Psychiatric: Pt behavior is normal. Thought content normal. 1+ nervous     Assessment & Plan:

## 2011-03-05 NOTE — Patient Instructions (Signed)
Take all new medications as prescribed Continue all other medications as before You can also take Delsym OTC for cough, and/or Mucinex (or it's generic off brand) for congestion  

## 2011-03-18 ENCOUNTER — Other Ambulatory Visit: Payer: Self-pay | Admitting: Gynecology

## 2011-03-18 DIAGNOSIS — Z1231 Encounter for screening mammogram for malignant neoplasm of breast: Secondary | ICD-10-CM

## 2011-04-07 ENCOUNTER — Other Ambulatory Visit: Payer: Self-pay

## 2011-04-08 MED ORDER — ALPRAZOLAM 0.25 MG PO TABS: 0.2500 mg | ORAL_TABLET | Freq: Every day | ORAL | Status: DC | PRN

## 2011-04-08 NOTE — Telephone Encounter (Signed)
CALLED IN #30 PILLS.

## 2011-05-20 ENCOUNTER — Other Ambulatory Visit: Payer: Self-pay | Admitting: *Deleted

## 2011-05-20 MED ORDER — ALPRAZOLAM 0.25 MG PO TABS: 0.2500 mg | ORAL_TABLET | Freq: Every day | ORAL | Status: DC | PRN

## 2011-05-20 NOTE — Telephone Encounter (Signed)
rx called in

## 2011-05-22 ENCOUNTER — Ambulatory Visit (INDEPENDENT_AMBULATORY_CARE_PROVIDER_SITE_OTHER): Payer: BC Managed Care – PPO | Admitting: Women's Health

## 2011-05-22 ENCOUNTER — Encounter: Payer: Self-pay | Admitting: Women's Health

## 2011-05-22 ENCOUNTER — Ambulatory Visit
Admission: RE | Admit: 2011-05-22 | Discharge: 2011-05-22 | Disposition: A | Discharge status: Home / Self Care | Payer: BC Managed Care – PPO | Source: Ambulatory Visit | Attending: Gynecology | Admitting: Gynecology

## 2011-05-22 VITALS — BP 132/80 | Ht 63.5 in | Wt 210.0 lb

## 2011-05-22 DIAGNOSIS — Z1231 Encounter for screening mammogram for malignant neoplasm of breast: Secondary | ICD-10-CM

## 2011-05-22 DIAGNOSIS — N951 Menopausal and female climacteric states: Secondary | ICD-10-CM

## 2011-05-22 DIAGNOSIS — F411 Generalized anxiety disorder: Secondary | ICD-10-CM

## 2011-05-22 DIAGNOSIS — F419 Anxiety disorder, unspecified: Secondary | ICD-10-CM

## 2011-05-22 DIAGNOSIS — Z01419 Encounter for gynecological examination (general) (routine) without abnormal findings: Secondary | ICD-10-CM

## 2011-05-22 DIAGNOSIS — Z780 Asymptomatic menopausal state: Secondary | ICD-10-CM

## 2011-05-22 DIAGNOSIS — Z79890 Hormone replacement therapy (postmenopausal): Secondary | ICD-10-CM

## 2011-05-22 MED ORDER — ALPRAZOLAM 0.25 MG PO TABS: 0.2500 mg | ORAL_TABLET | Freq: Every day | ORAL | Status: DC | PRN

## 2011-05-22 MED ORDER — ESTRADIOL 2 MG PO TABS: 2.0000 mg | ORAL_TABLET | Freq: Every day | ORAL | Status: DC

## 2011-05-22 NOTE — Progress Notes (Signed)
Kelli Bell [birth year] [id number]    History:    The patient presents for annual exam. Hysterectomy with BSO on estradiol 2 mg daily with occasional hot flushes. Hypothyroidism on Synthroid, has occasional problems with anxiety uses an occasional Xanax. History of normal Paps and mammograms. Had a bone density at primary care,  T score of -0.5. Had a negative colonoscopy August of 2012.  Past medical history, past surgical history, family history and social history were all reviewed and documented in the EPIC chart. Retired. Bought parents a 1 floor condo and helping them to move in next week.   ROS:  A  ROS was performed and pertinent positives and negatives are included in the history.  Exam:  Filed Vitals:   05/22/11 1425  BP: 132/80    General appearance:  Normal Head/Neck:  Normal, without cervical or supraclavicular adenopathy. Thyroid:  Symmetrical, normal in size, without palpable masses or nodularity. Respiratory  Effort:  Normal  Auscultation:  Clear without wheezing or rhonchi Cardiovascular  Auscultation:  Regular rate, without rubs, murmurs or gallops  Edema/varicosities:  Not grossly evident Abdominal  Soft,nontender, without masses, guarding or rebound.  Liver/spleen:  No organomegaly noted  Hernia:  None appreciated  Skin  Inspection:  Grossly normal  Palpation:  Grossly normal Neurologic/psychiatric  Orientation:  Normal with appropriate conversation.  Mood/affect:  Normal  Genitourinary    Breasts: Examined lying and sitting.     Right: Without masses, retractions, discharge or axillary adenopathy.     Left: Without masses, retractions, discharge or axillary adenopathy.   Inguinal/mons:  Normal without inguinal adenopathy  External genitalia:  Normal  BUS/Urethra/Skene's glands:  Normal  Bladder:  Normal  Vagina:  Normal  Cervix:  Absent  Uterus:  Absent  Adnexa/parametria:     Rt: Without masses or tenderness.   Lt: Without masses or  tenderness.  Anus and perineum: Normal  Digital rectal exam: Normal sphincter tone without palpated masses or tenderness  Assessment/Plan:  51 y.o. [name] for annual exam with no complaints.  Postmenopausal on ERT/TVH with BSO Hypothyroidism, GERD-primary care labs, DEXA and meds. Mild anxiety Obesity  Plan: Continue labs at primary care. Estradiol 2 mg by mouth daily, prescription, proper use, slight risk for blood clots, strokes and breast cancer reviewed. SBE's, annual mammogram, calcium rich diet, vitamin D 2000 daily , decrease calories, Weight Watchers reviewed, and increase regular exercise encouraged. Xanax 0.25 one daily when necessary #30 with 1 refill given, aware it is addictive, uses sparingly.     Kelli Bell [employer] 4:25 PM 05/22/2011

## 2011-05-22 NOTE — Patient Instructions (Signed)
Calorie Counting Diet A calorie counting diet requires you to eat the number of calories that are right for you in a day. Calories are the measurement of how much energy you get from the food you eat. Eating the right amount of calories is important for staying at a healthy weight. If you eat too many calories, your body will store them as fat and you may gain weight. If you eat too few calories, you may lose weight. Counting the number of calories you eat during a day will help you know if you are eating the right amount. A Registered Dietitian can determine how many calories you need in a day. The amount of calories needed varies from person to person. If your goal is to lose weight, you will need to eat fewer calories. Losing weight can benefit you if you are overweight or have health problems such as heart disease, high blood pressure, or diabetes. If your goal is to gain weight, you will need to eat more calories. Gaining weight may be necessary if you have a certain health problem that causes your body to need more energy. TIPS Whether you are increasing or decreasing the number of calories you eat during a day, it may be hard to get used to changes in what you eat and drink. The following are tips to help you keep track of the number of calories you eat.  Measure foods at home with measuring cups. This helps you know the amount of food and number of calories you are eating.   Restaurants often serve food in amounts that are larger than 1 serving. While eating out, estimate how many servings of a food you are given. For example, a serving of cooked rice is  cup or about the size of half of a fist. Knowing serving sizes will help you be aware of how much food you are eating at restaurants.   Ask for smaller portion sizes or child-size portions at restaurants.   Plan to eat half of a meal at a restaurant. Take the rest home or share the other half with a friend.   Read the Nutrition Facts panel on  food labels for calorie content and serving size. You can find out how many servings are in a package, the size of a serving, and the number of calories each serving has.   For example, a package might contain 3 cookies. The Nutrition Facts panel on that package says that 1 serving is 1 cookie. Below that, it will say there are 3 servings in the container. The calories section of the Nutrition Facts label says there are 90 calories. This means there are 90 calories in 1 cookie (1 serving). If you eat 1 cookie you have eaten 90 calories. If you eat all 3 cookies, you have eaten 270 calories (3 servings x 90 calories = 270 calories).  The list below tells you how big or small some common portion sizes are.  1 oz.........4 stacked dice.   3 oz.........Deck of cards.   1 tsp........Tip of little finger.   1 tbs........Thumb.   2 tbs........Golf ball.    cup.......Half of a fist.   1 cup........A fist.  KEEP A FOOD LOG Write down every food item you eat, the amount you eat, and the number of calories in each food you eat during the day. At the end of the day, you can add up the total number of calories you have eaten. It may help to keep a   list like the one below. Find out the calorie information by reading the Nutrition Facts panel on food labels. Breakfast  Bran cereal (1 cup, 110 calories).   Fat-free milk ( cup, 45 calories).  Snack  Apple (1 medium, 80 calories).  Lunch  Spinach (1 cup, 20 calories).   Tomato ( medium, 20 calories).   Chicken breast strips (3 oz, 165 calories).   Shredded cheddar cheese ( cup, 110 calories).   Light [place] dressing (2 tbs, 60 calories).   Whole-wheat bread (1 slice, 80 calories).   Tub margarine (1 tsp, 35 calories).   Vegetable soup (1 cup, 160 calories).  Dinner  Pork chop (3 oz, 190 calories).   Brown rice (1 cup, 215 calories).   Steamed broccoli ( cup, 20 calories).   Strawberries (1  cup, 65 calories).   Whipped  cream (1 tbs, 50 calories).  Daily Calorie Total: 1425 Document Released: 01/12/2005 Document Revised: 01/01/2011 Document Reviewed: 07/09/2006 [facility] Patient Information 2012 [place], [place].Health Recommendations for Postmenopausal Women Based on the Results of the Women's Health Initiative [facility]) and Other Studies The WHI is a major 15-year research program to address the most common causes of death, disability and poor quality of life in postmenopausal women. Some of these causes are heart disease, cancer, bone loss (osteoporosis) and others. Taking into account all of the findings from [facility] and other studies, here are bottom-line health recommendations for women: CARDIOVASCULAR DISEASE Heart Disease: A heart attack is a medical emergency. Know the signs and symptoms of a heart attack. Hormone therapy should not be used to prevent heart disease. In women with heart disease, hormone therapy should not be used to prevent further disease. Hormone therapy increases the risk of blood clots. Below are things women can do to reduce their risk for heart disease.   Do not smoke. If you smoke, quit. Women who smoke are 2 to 6 times more likely to suffer a heart attack than non-smoking women.   Aim for a healthy weight. Being overweight causes many preventable deaths. Eat a healthy and balanced diet and drink an adequate amount of liquids.   Get moving. Make a commitment to be more physically active. Aim for 30 minutes of activity on most, if not all days of the week.   Eat for heart health. Choose a diet that is low in saturated fat, trans fat, and cholesterol. Include whole grains, vegetables, and fruits. Read the labels on the food container before buying it.   Know your numbers. Ask your caregiver to check your blood pressure, cholesterol (total, HDL, LDL, triglycerides) and blood glucose. Work with your caregiver to improve any numbers that are not normal.   High blood pressure. Limit or stop your  table salt intake (try salt substitute and food seasonings), avoid salty foods and drinks. Read the labels on the food container before buying it. Avoid becoming overweight by eating well and exercising.  STROKE  Stroke is a medical emergency. Stroke can be the result of a blood clot in the blood vessel in the brain or by a brain hemorrhage (bleeding). Know the signs and symptoms of a stroke. To lower the risk of developing a stroke:  Avoid fatty foods.   Quit smoking.   Control your diabetes, blood pressure, and irregular heart rate.  THROMBOPHLIBITIS (BLOOD CLOT) OF THE LEG  Hormone treatment is a big cause of developing blood clots in the leg. Becoming overweight and leading a stationary lifestyle also may contribute to developing blood  clots. Controlling your diet and exercising will help lower the risk of developing blood clots. CANCER SCREENING  Breast Cancer: Women should take steps to reduce their risk of breast cancer. This includes having regular mammograms, monthly self breast exams and regular breast exams by your caregiver. Have a mammogram every one to two years if you are 38 to 52 years old. Have a mammogram annually if you are 54 years old or older depending on your risk factors. Women who are high risk for breast cancer may need more frequent mammograms. There are tests available (testing the genes in your body) if you have family history of breast cancer called BRCA 1 and 2. These tests can help determine the risks of developing breast cancer.   Intestinal or Stomach Cancer: Women should talk to their caregiver about when to start screening, what tests and how often they should be done, and the benefits and risks of doing these tests. Tests to consider are a rectal exam, fecal occult blood, sigmoidoscopy, colononoscoby, barium enema and upper GI series of the stomach. Depending on the age, you may want to get a medical and family history of colon cancer. Women who are high risk may  need to be screened at an earlier age and more often.   Cervical Cancer: A Pap test of the cervix should be done every year and every 3 years when there has been three straight years of a normal Pap test. Women with an abnormal Pap test should be screened more often or have a cervical biopsy depending on your caregiver's recommendation.   Uterine Cancer: If you have vaginal bleeding after you are in the menopause, it should be evaluated by your caregiver.   Ovarian cancer: There are no reliable tests available to screen for ovarian cancer at this time except for yearly pelvic exams.   Lung Cancer: Yearly chest X-rays can detect lung cancer and should be done on high risk women, such as cigarette smokers and women with chronic lung disease (emphysemia).   Skin Cancer: A complete body skin exam should be done at your yearly examination. Avoid overexposure to the sun and ultraviolet light lamps. Use a strong sun block cream when in the sun. All of these things are important in lowering the risk of skin cancer.  MENOPAUSE Menopause Symptoms: Hormone therapy products are effective for treating symptoms associated with menopause:  Moderate to severe hot flashes.   Night sweats.   Mood swings.   Headaches.   Tiredness.   Loss of sex drive.   Insomnia.   Other symptoms.  However, hormone therapy products carry serious risks, especially in older women. Women who use or are thinking about using estrogen or estrogen with progestin treatments should discuss that with their caregiver. Your caregiver will know if the benefits outweigh the risks. The Food and Drug Administration (FDA) has concluded that hormone therapy should be used only at the lowest doses and for the shortest amount of time to reach treatment goals. It is not known at what doses there may be less risk of serious side effects. There are other treatments such as herbal medication (not controlled or regulated by the FDA), group  therapy, counseling and acupuncture that may be helpful. OSTEOPOROSIS Protecting Against Bone Loss and Preventing Fracture: If hormone therapy is used for prevention of bone loss (osteoporosis), the risks for bone loss must outweigh the risk of the therapy. Women considering taking hormone therapy for bone loss should ask their health care providers about  other medications (fosamax and boniva) that are considered safe and effective for preventing bone loss and bone fractures. To guard against bone loss or fractures, it is recommended that women should take at least 1000-1500 mg of calcium and 400-800 IU of vitamin D daily in divided doses. Smoking and excessive alcohol intake increases the risk of osteoporosis. Eat foods rich in calcium and vitamin D and do weight bearing exercises several times a week as your caregiver suggests. DIABETES Diabetes Melitus: Women with Type I or Type 2 diabetes should keep their diabetes in control with diet, exercise and medication. Avoid too many sweets, starchy and fatty foods. Being overweight can affect your diabetes. COGNITION AND MEMORY Cognition and Memory: Menopausal hormone therapy is not recommended for the prevention of cognitive disorders such as Alzheimer's disease or memory loss. WHI found that women treated with hormone therapy have a greater risk of developing dementia.  DEPRESSION  Depression may occur at any age, but is common in elderly women. The reasons may be because of physical, medical, social (loneliness), financial and/or economic problems and needs. Becoming involved with church, volunteer or social groups, seeking treatment for any physical or medical problems is recommended. Also, look into getting professional advice for any economic or financial problems. ACCIDENTS  Accidents are common and can be serious in the elderly woman. Prepare your house to prevent accidents. Eliminate throw rugs, use hip protectors, place hand bars in the bath, shower  and toilet areas. Avoid wearing high heel shoes and walking on wet, snowy and icy areas. Stop driving if you have vision, hearing problems or are unsteady with you movements and reflexes. RHEUMATOID ARTHRITIS Rheumatoid arthritis causes pain, swelling and stiffness of your bone joints. It can limit many of your activities. Over-the-counter medications may help, but prescription medications may be necessary. Talk with your caregiver about this. Exercise (walking, water aerobics), good posture, using splints on painful joints, warm baths or applying warm compresses to stiff joints and cold compresses to painful joints may be helpful. Smoking and excessive drinking may worsen the symptoms of arthritis. Seek help from a physical therapist if the arthritis is becoming a problem with your daily activities. IMMUNIZATIONS  Several immunizations are important to have during your senior years, including:   Tetanus and a diptheria shot booster every 10 years.   Influenza every year before the flu season begins.   Pneumonia vaccine.   Shingles vaccine.   Others as indicated (example: H1N1 vaccine).  Document Released: 03/06/2005 Document Revised: 01/01/2011 Document Reviewed: 10/31/2007 [facility] Patient Information 2012 [place], [place].

## 2011-05-26 ENCOUNTER — Other Ambulatory Visit: Payer: Self-pay | Admitting: Internal Medicine

## 2011-09-21 ENCOUNTER — Other Ambulatory Visit: Payer: Self-pay | Admitting: *Deleted

## 2011-09-21 DIAGNOSIS — F419 Anxiety disorder, unspecified: Secondary | ICD-10-CM

## 2011-09-21 MED ORDER — ALPRAZOLAM 0.25 MG PO TABS: 0.2500 mg | ORAL_TABLET | Freq: Every day | ORAL | Status: DC | PRN

## 2011-09-21 NOTE — Telephone Encounter (Signed)
Last fill date 08/12/11

## 2011-10-21 ENCOUNTER — Other Ambulatory Visit: Payer: Self-pay | Admitting: Internal Medicine

## 2011-10-22 ENCOUNTER — Telehealth: Payer: Self-pay | Admitting: *Deleted

## 2011-10-22 MED ORDER — ALPRAZOLAM 0.25 MG PO TABS: 0.2500 mg | ORAL_TABLET | Freq: Every evening | ORAL | Status: DC | PRN

## 2011-10-22 NOTE — Telephone Encounter (Signed)
rx called in, pt informed. 

## 2011-10-22 NOTE — Telephone Encounter (Signed)
Pt calling requesting refill Xanax 0.25 mg. Okay to fill? Please advise

## 2011-10-22 NOTE — Telephone Encounter (Signed)
Please call in Xanax 0.25 every 8 hours when necessary #30 no refills.

## 2011-11-24 ENCOUNTER — Other Ambulatory Visit: Payer: Self-pay | Admitting: *Deleted

## 2011-11-24 DIAGNOSIS — F419 Anxiety disorder, unspecified: Secondary | ICD-10-CM

## 2011-11-24 NOTE — Telephone Encounter (Signed)
Last refill 10/22/11

## 2011-11-25 MED ORDER — ALPRAZOLAM 0.25 MG PO TABS: 0.2500 mg | ORAL_TABLET | Freq: Every day | ORAL | Status: DC | PRN

## 2011-12-28 ENCOUNTER — Other Ambulatory Visit: Payer: Self-pay | Admitting: *Deleted

## 2011-12-28 DIAGNOSIS — F419 Anxiety disorder, unspecified: Secondary | ICD-10-CM

## 2011-12-28 MED ORDER — ALPRAZOLAM 0.25 MG PO TABS: 0.2500 mg | ORAL_TABLET | Freq: Every day | ORAL | Status: DC | PRN

## 2012-01-13 ENCOUNTER — Other Ambulatory Visit: Payer: Self-pay | Admitting: Internal Medicine

## 2012-02-05 ENCOUNTER — Encounter: Payer: Self-pay | Admitting: Internal Medicine

## 2012-02-05 ENCOUNTER — Ambulatory Visit (INDEPENDENT_AMBULATORY_CARE_PROVIDER_SITE_OTHER): Payer: 59 | Admitting: Internal Medicine

## 2012-02-05 VITALS — BP 110/70 | HR 58 | Temp 98.8°F | Ht 64.0 in | Wt 201.5 lb

## 2012-02-05 DIAGNOSIS — F419 Anxiety disorder, unspecified: Secondary | ICD-10-CM

## 2012-02-05 DIAGNOSIS — G8929 Other chronic pain: Secondary | ICD-10-CM

## 2012-02-05 DIAGNOSIS — Z0000 Encounter for general adult medical examination without abnormal findings: Secondary | ICD-10-CM

## 2012-02-05 DIAGNOSIS — M545 Low back pain, unspecified: Secondary | ICD-10-CM

## 2012-02-05 DIAGNOSIS — F329 Major depressive disorder, single episode, unspecified: Secondary | ICD-10-CM

## 2012-02-05 DIAGNOSIS — J019 Acute sinusitis, unspecified: Secondary | ICD-10-CM

## 2012-02-05 DIAGNOSIS — F411 Generalized anxiety disorder: Secondary | ICD-10-CM

## 2012-02-05 MED ORDER — LEVOFLOXACIN 250 MG PO TABS: 250.0000 mg | ORAL_TABLET | Freq: Every day | ORAL | Status: DC

## 2012-02-05 MED ORDER — TRAMADOL HCL 50 MG PO TABS: 50.0000 mg | ORAL_TABLET | Freq: Four times a day (QID) | ORAL | Status: DC | PRN

## 2012-02-05 MED ORDER — ALPRAZOLAM 0.25 MG PO TABS: ORAL_TABLET | ORAL | Status: DC

## 2012-02-05 NOTE — Patient Instructions (Addendum)
Take all new medications as prescribed Continue all other medications as before Thank you for enrolling in MyChart. Please follow the instructions below to securely access your online medical record. MyChart allows you to send messages to your doctor, view your test results, renew your prescriptions, schedule appointments, and more. To Log into MyChart, please go to [email or web address], and your Username is: [email or web address] Please return in 3 mo with Lab testing done 3-5 days before (the office will call with the appt)

## 2012-02-06 ENCOUNTER — Encounter: Payer: Self-pay | Admitting: Internal Medicine

## 2012-02-06 NOTE — Progress Notes (Signed)
Subjective:    Patient ID: Kelli Bell, female    DOB: July 17, 1960, 52 y.o.   MRN: [id number]  HPI   Here with 3 days acute onset fever, facial pain, pressure, general weakness and malaise, and greenish d/c, with slight ST, but little to no cough and Pt denies chest pain, increased sob or doe, wheezing, orthopnea, PND, increased LE swelling, palpitations, dizziness or syncope.  Pt continues to have recurring LBP without change in severity, bowel or bladder change, fever, wt loss,  worsening LE pain/numbness/weakness, gait change or falls. Is s/p ESI without help and no longer is seeing the surgeon or pain manageement. Denies worsening depressive symptoms, suicidal ideation, or panic, though has ongoing anxiety, not increased recently.   Pt denies new neurological symptoms such as new headache, or facial or extremity weakness or numbness  Pt denies polydipsia, polyuria Past Medical History  Diagnosis Date  . ANXIETY 03/30/2007  . BACK PAIN 02/20/2009  . DEPRESSION 03/30/2007  . Dysuria 02/20/2009  . EPIGASTRIC PAIN 11/01/2009  . FATIGUE 06/29/2007  . GERD 01/13/2007  . HYPERLIPIDEMIA 02/22/2008  . MENOPAUSAL DISORDER 02/23/2008  . OBSTRUCTIVE SLEEP APNEA 06/29/2007  . SINUSITIS- ACUTE-NOS 03/30/2007  . THORACIC/LUMBOSACRAL NEURITIS/RADICULITIS UNSPEC 02/23/2008  . VERTIGO 11/23/2007  . Wheezing 01/08/2009  . HYPOTHYROIDISM 03/30/2007   Past Surgical History  Procedure Date  . Cholecystectomy 1980  . Jaw bone surgury     52 yo  . Abdominal hysterectomy 2004    with BSO  . Oophorectomy 2004    BSO with Hysterectomy  . Tonsillectomy     reports that she has quit smoking. She has never used smokeless tobacco. She reports that she does not drink alcohol or use illicit drugs. family history includes Arthritis in her other; Asthma in her brother; Breast cancer in her maternal aunt; Cancer in her mother; Colon polyps (age of onset:69) in her father; Diabetes in her mother and paternal grandmother; Heart  disease in her paternal grandmother; Hypertension in her mother; Lymphoma in her maternal grandmother; Osteoporosis in her other; Stroke in her mother; and Thyroid disease in her maternal grandmother and mother.  There is no history of Colon cancer. Allergies  Allergen Reactions  . Codeine Nausea Only   Current Outpatient Prescriptions on File Prior to Visit  Medication Sig Dispense Refill  . Calcium Carbonate-Vitamin D (CALTRATE 600+D) 600-400 MG-UNIT per tablet Take 1 tablet by mouth 2 (two) times daily.        . Cholecalciferol (VITAMIN D3) 1000 UNITS CAPS Take by mouth 2 (two) times daily.        . Cyanocobalamin (VITAMIN B 12 PO) Take by mouth 2 (two) times daily.        Marland Kitchen estradiol (ESTRACE) 2 MG tablet Take 1 tablet (2 mg total) by mouth daily.  90 tablet  4  . fluticasone (FLONASE) 50 MCG/ACT nasal spray Place 2 sprays into the nose daily.        Marland Kitchen levothyroxine (SYNTHROID, LEVOTHROID) 75 MCG tablet TAKE ONE TABLET BY MOUTH ONE TIME DAILY  90 tablet  0  . Multiple Vitamin (MULTIVITAMIN) capsule Take 1 capsule by mouth daily.        Marland Kitchen NEXIUM 40 MG capsule TAKE ONE CAPSULE BY MOUTH ONE TIME DAILY  30 each  10  . Omega-3 Fatty Acids (FISH OIL) 1000 MG CAPS Take by mouth 2 (two) times daily.        . promethazine (PHENERGAN) 25 MG tablet Take 25 mg by mouth every 6 (  six) hours as needed.         Review of Systems  Constitutional: Negative for diaphoresis HENT: Negative for tinnitus.   Eyes: Negative for photophobia and visual disturbance.  Respiratory: Negative for choking and stridor.   Gastrointestinal: Negative for vomiting and blood in stool.  Genitourinary: Negative for hematuria and decreased urine volume.  Musculoskeletal: Negative for gait problem.  Skin: Negative for color change and wound.  Neurological: Negative for tremors and numbness.  Psychiatric/Behavioral: Negative for decreased concentration. The patient is not hyperactive.       Objective:   Physical Exam BP  110/70  Pulse 58  Temp 98.8 F (37.1 C) (Oral)  Ht 5\' 4"  (1.626 m)  Wt 201 lb 8 oz (91.4 kg)  BMI 34.59 kg/m2  SpO2 97% Physical Exam  VS noted Constitutional: Pt appears well-developed and well-nourished.  HENT: Head: Normocephalic.  Right Ear: External ear normal.  Left Ear: External ear normal.  Bilat tm's mild erythema.  Sinus tender.  Pharynx mild erythema Eyes: Conjunctivae and EOM are normal. Pupils are equal, round, and reactive to light.  Neck: Normal range of motion. Neck supple.  Cardiovascular: Normal rate and regular rhythm.   Pulmonary/Chest: Effort normal and breath sounds normal.  Neurological: Pt is alert. Not confused , motor intact Spine with diffuse chronic tender midline Skin: Skin is warm. No erythema.  Psychiatric: Pt behavior is normal. Thought content normal. Not depressed affect    Assessment & Plan:

## 2012-02-06 NOTE — Assessment & Plan Note (Signed)
stable overall by hx and exam, most recent data reviewed with pt, and pt to continue medical treatment as before, for xanax refill today

## 2012-02-06 NOTE — Assessment & Plan Note (Signed)
Exam stable, for pain med refill,  to f/u any worsening symptoms or concerns

## 2012-02-06 NOTE — Assessment & Plan Note (Signed)
Mild to mod, for antibx course,  to f/u any worsening symptoms or concerns 

## 2012-02-08 ENCOUNTER — Encounter: Payer: Self-pay | Admitting: Internal Medicine

## 2012-02-08 ENCOUNTER — Other Ambulatory Visit: Payer: Self-pay | Admitting: Gynecology

## 2012-02-08 DIAGNOSIS — Z1231 Encounter for screening mammogram for malignant neoplasm of breast: Secondary | ICD-10-CM

## 2012-02-15 ENCOUNTER — Telehealth: Payer: Self-pay

## 2012-02-15 MED ORDER — PANTOPRAZOLE SODIUM 40 MG PO TBEC: 40.0000 mg | DELAYED_RELEASE_TABLET | Freq: Every day | ORAL | Status: DC

## 2012-02-15 MED ORDER — CEPHALEXIN 500 MG PO CAPS: 500.0000 mg | ORAL_CAPSULE | Freq: Four times a day (QID) | ORAL | Status: DC

## 2012-02-15 NOTE — Telephone Encounter (Signed)
Patient informed antibiotic sent in.  I meant nexium is not covered, sorry did not list the medication not covered.

## 2012-02-15 NOTE — Telephone Encounter (Signed)
Patient informed. 

## 2012-02-15 NOTE — Telephone Encounter (Signed)
Patient has completed antibiotic prescribed at [facility].  She is still having pressure behind left eye and drainage.  States usually takes 2 antibiotics to completely take infection away.  Please send in another to [facility] and call back number when ready is [phone]

## 2012-02-15 NOTE — Telephone Encounter (Signed)
Received fax from pharmacy stating the patients insurance is not covered under her insurance, please change.

## 2012-02-15 NOTE — Telephone Encounter (Signed)
Ok to try protonix generic instead - done erx

## 2012-02-15 NOTE — Telephone Encounter (Signed)
i think you meant the levaquin is not covered under the insurance  Ok for cephalexin - done erx

## 2012-03-10 ENCOUNTER — Encounter: Payer: Self-pay | Admitting: Internal Medicine

## 2012-03-12 ENCOUNTER — Other Ambulatory Visit: Payer: Self-pay

## 2012-04-11 ENCOUNTER — Other Ambulatory Visit: Payer: Self-pay

## 2012-04-11 DIAGNOSIS — F419 Anxiety disorder, unspecified: Secondary | ICD-10-CM

## 2012-04-11 MED ORDER — ALPRAZOLAM 0.25 MG PO TABS: ORAL_TABLET | ORAL | Status: DC

## 2012-04-11 NOTE — Telephone Encounter (Signed)
Faxed hardcopy to pharmacy. 

## 2012-04-11 NOTE — Telephone Encounter (Signed)
Done hardcopy to [facility]  

## 2012-04-25 ENCOUNTER — Telehealth: Payer: Self-pay

## 2012-04-25 DIAGNOSIS — F419 Anxiety disorder, unspecified: Secondary | ICD-10-CM

## 2012-04-25 MED ORDER — ALPRAZOLAM 0.25 MG PO TABS: ORAL_TABLET | ORAL | Status: DC

## 2012-04-25 NOTE — Telephone Encounter (Signed)
Done hardcopy to [facility]  

## 2012-04-25 NOTE — Telephone Encounter (Signed)
Faxed hardcopy to pharmacy and called the patient to inform of change.

## 2012-04-25 NOTE — Telephone Encounter (Signed)
Pt called stating that 1/2 tablet of xanax BID has not been controlling her anxiety sxs well. Pt is requesting directions be changed to 1 tablet BID and quantity increased as well to #30, please advise.

## 2012-04-29 ENCOUNTER — Other Ambulatory Visit (INDEPENDENT_AMBULATORY_CARE_PROVIDER_SITE_OTHER): Payer: 59

## 2012-04-29 DIAGNOSIS — Z0000 Encounter for general adult medical examination without abnormal findings: Secondary | ICD-10-CM

## 2012-04-29 LAB — BASIC METABOLIC PANEL
BUN: 16 mg/dL (ref 6–23)
CO2: 27 mEq/L (ref 19–32)
Calcium: 9.1 mg/dL (ref 8.4–10.5)
Chloride: 101 mEq/L (ref 96–112)
Creatinine, Ser: 0.6 mg/dL (ref 0.4–1.2)
GFR: 118.2 mL/min (ref >60.00)
Glucose, Bld: 98 mg/dL (ref 70–99)
Potassium: 4 mEq/L (ref 3.5–5.1)
Sodium: 137 mEq/L (ref 135–145)

## 2012-04-29 LAB — CBC WITH DIFFERENTIAL/PLATELET
Basophils Absolute: 0.1 10*3/uL (ref 0.0–0.1)
Basophils Relative: 1.4 % (ref 0.0–3.0)
Eosinophils Absolute: 0.3 10*3/uL (ref 0.0–0.7)
Eosinophils Relative: 5.6 % — ABNORMAL HIGH (ref 0.0–5.0)
HCT: 36.4 % (ref 36.0–46.0)
Hemoglobin: 12.5 g/dL (ref 12.0–15.0)
Lymphocytes Relative: 36.7 % (ref 12.0–46.0)
Lymphs Abs: 2 10*3/uL (ref 0.7–4.0)
MCHC: 34.2 g/dL (ref 30.0–36.0)
MCV: 88.8 fl (ref 78.0–100.0)
Monocytes Absolute: 0.6 10*3/uL (ref 0.1–1.0)
Monocytes Relative: 10.5 % (ref 3.0–12.0)
Neutro Abs: 2.6 10*3/uL (ref 1.4–7.7)
Neutrophils Relative %: 45.8 % (ref 43.0–77.0)
Platelets: 346 10*3/uL (ref 150.0–400.0)
RBC: 4.1 Mil/uL (ref 3.87–5.11)
RDW: 12.6 % (ref 11.5–14.6)
WBC: 5.6 10*3/uL (ref 4.5–10.5)

## 2012-04-29 LAB — URINALYSIS, ROUTINE W REFLEX MICROSCOPIC
Bilirubin Urine: NEGATIVE
Hgb urine dipstick: NEGATIVE
Ketones, ur: NEGATIVE
Leukocytes, UA: NEGATIVE
Nitrite: NEGATIVE
Specific Gravity, Urine: >=1.03 (ref 1.000–1.030)
Total Protein, Urine: NEGATIVE
Urine Glucose: NEGATIVE
Urobilinogen, UA: 0.2 (ref 0.0–1.0)
pH: 6 (ref 5.0–8.0)

## 2012-04-29 LAB — TSH: TSH: 3.71 u[IU]/mL (ref 0.35–5.50)

## 2012-04-29 LAB — HEPATIC FUNCTION PANEL
ALT: 13 U/L (ref 0–35)
AST: 16 U/L (ref 0–37)
Albumin: 3.6 g/dL (ref 3.5–5.2)
Alkaline Phosphatase: 44 U/L (ref 39–117)
Bilirubin, Direct: 0.1 mg/dL (ref 0.0–0.3)
Total Bilirubin: 0.3 mg/dL (ref 0.3–1.2)
Total Protein: 6.5 g/dL (ref 6.0–8.3)

## 2012-04-29 LAB — LDL CHOLESTEROL, DIRECT: Direct LDL: 94.8 mg/dL

## 2012-04-29 LAB — LIPID PANEL
Cholesterol: 208 mg/dL — ABNORMAL HIGH (ref 0–200)
HDL: 75.1 mg/dL (ref >39.00)
Total CHOL/HDL Ratio: 3
Triglycerides: 238 mg/dL — ABNORMAL HIGH (ref 0.0–149.0)
VLDL: 47.6 mg/dL — ABNORMAL HIGH (ref 0.0–40.0)

## 2012-05-06 ENCOUNTER — Ambulatory Visit (INDEPENDENT_AMBULATORY_CARE_PROVIDER_SITE_OTHER): Payer: 59 | Admitting: Internal Medicine

## 2012-05-06 ENCOUNTER — Encounter: Payer: Self-pay | Admitting: Internal Medicine

## 2012-05-06 VITALS — BP 140/80 | HR 68 | Temp 98.2°F | Ht 64.0 in | Wt 211.0 lb

## 2012-05-06 DIAGNOSIS — N951 Menopausal and female climacteric states: Secondary | ICD-10-CM

## 2012-05-06 DIAGNOSIS — Z0000 Encounter for general adult medical examination without abnormal findings: Secondary | ICD-10-CM

## 2012-05-06 NOTE — Patient Instructions (Addendum)
Please continue all other medications as before, and refills have been done if requested. Please continue your efforts at being more active, low cholesterol diet, and weight control. Please remember to followup with your GYN for the yearly pap smear and/or mammogram as you do You are otherwise up to date with prevention measures today. Please stop at the checkout today to schedule the bone density exam Thank you for enrolling in MyChart. Please follow the instructions below to securely access your online medical record. MyChart allows you to send messages to your doctor, view your test results, renew your prescriptions, schedule appointments, and more. Please return in 1 year for your yearly visit, or sooner if needed, with Lab testing done 3-5 days before

## 2012-05-06 NOTE — Assessment & Plan Note (Signed)

## 2012-05-06 NOTE — Progress Notes (Signed)
Subjective:    Patient ID: Kelli Bell, female    DOB: November 27, 1960, 52 y.o.   MRN: [id number]  HPI  Here for wellness and f/u;  Overall doing ok;  Pt denies CP, worsening SOB, DOE, wheezing, orthopnea, PND, worsening LE edema, palpitations, dizziness or syncope.  Pt denies neurological change such as new headache, facial or extremity weakness.  Pt denies polydipsia, polyuria, or low sugar symptoms. Pt states overall good compliance with treatment and medications, good tolerability, and has been trying to follow lower cholesterol diet.  Pt denies worsening depressive symptoms, suicidal ideation or panic. No fever, night sweats, wt loss, loss of appetite, or other constitutional symptoms.  Pt states good ability with ADL's, has low fall risk, home safety reviewed and adequate, no other significant changes in hearing or vision, and only occasionally active with exercise.  No acute complaints Past Medical History  Diagnosis Date  . ANXIETY 03/30/2007  . BACK PAIN 02/20/2009  . DEPRESSION 03/30/2007  . Dysuria 02/20/2009  . EPIGASTRIC PAIN 11/01/2009  . FATIGUE 06/29/2007  . GERD 01/13/2007  . HYPERLIPIDEMIA 02/22/2008  . MENOPAUSAL DISORDER 02/23/2008  . OBSTRUCTIVE SLEEP APNEA 06/29/2007  . SINUSITIS- ACUTE-NOS 03/30/2007  . THORACIC/LUMBOSACRAL NEURITIS/RADICULITIS UNSPEC 02/23/2008  . VERTIGO 11/23/2007  . Wheezing 01/08/2009  . HYPOTHYROIDISM 03/30/2007  . Chronic low back pain 02/06/2012   Past Surgical History  Procedure Laterality Date  . Cholecystectomy  1980  . Jaw bone surgury      52 yo  . Abdominal hysterectomy  2004    with BSO  . Oophorectomy  2004    BSO with Hysterectomy  . Tonsillectomy      reports that she has quit smoking. She has never used smokeless tobacco. She reports that she does not drink alcohol or use illicit drugs. family history includes Arthritis in her other; Asthma in her brother; Breast cancer in her maternal aunt; Cancer in her mother; Colon polyps (age of onset: 76)  in her father; Diabetes in her mother and paternal grandmother; Heart disease in her paternal grandmother; Hypertension in her mother; Lymphoma in her maternal grandmother; Osteoporosis in her other; Stroke in her mother; and Thyroid disease in her maternal grandmother and mother.  There is no history of Colon cancer. Allergies  Allergen Reactions  . Codeine Nausea Only   Current Outpatient Prescriptions on File Prior to Visit  Medication Sig Dispense Refill  . ALPRAZolam (XANAX) 0.25 MG tablet 1 tab by mouth twice per day as needed  60 tablet  2  . Calcium Carbonate-Vitamin D (CALTRATE 600+D) 600-400 MG-UNIT per tablet Take 1 tablet by mouth 2 (two) times daily.        . cephALEXin (KEFLEX) 500 MG capsule Take 1 capsule (500 mg total) by mouth 4 (four) times daily.  40 capsule  0  . Cholecalciferol (VITAMIN D3) 1000 UNITS CAPS Take by mouth 2 (two) times daily.        . Cyanocobalamin (VITAMIN B 12 PO) Take by mouth 2 (two) times daily.        Marland Kitchen estradiol (ESTRACE) 2 MG tablet Take 1 tablet (2 mg total) by mouth daily.  90 tablet  4  . fluticasone (FLONASE) 50 MCG/ACT nasal spray Place 2 sprays into the nose daily.        Marland Kitchen levothyroxine (SYNTHROID, LEVOTHROID) 75 MCG tablet TAKE ONE TABLET BY MOUTH ONE TIME DAILY  90 tablet  0  . Multiple Vitamin (MULTIVITAMIN) capsule Take 1 capsule by mouth daily.        Marland Kitchen  Omega-3 Fatty Acids (FISH OIL) 1000 MG CAPS Take by mouth 2 (two) times daily.        . pantoprazole (PROTONIX) 40 MG tablet Take 1 tablet (40 mg total) by mouth daily.  90 tablet  3  . promethazine (PHENERGAN) 25 MG tablet Take 25 mg by mouth every 6 (six) hours as needed.        . traMADol (ULTRAM) 50 MG tablet Take 1 tablet (50 mg total) by mouth every 6 (six) hours as needed.  60 tablet  1   No current facility-administered medications on file prior to visit.   Review of Systems Constitutional: Negative for diaphoresis, activity change, appetite change or unexpected weight change.   HENT: Negative for hearing loss, ear pain, facial swelling, mouth sores and neck stiffness.   Eyes: Negative for pain, redness and visual disturbance.  Respiratory: Negative for shortness of breath and wheezing.   Cardiovascular: Negative for chest pain and palpitations.  Gastrointestinal: Negative for diarrhea, blood in stool, abdominal distention or other pain Genitourinary: Negative for hematuria, flank pain or change in urine volume.  Musculoskeletal: Negative for myalgias and joint swelling.  Skin: Negative for color change and wound.  Neurological: Negative for syncope and numbness. other than noted Hematological: Negative for adenopathy.  Psychiatric/Behavioral: Negative for hallucinations, self-injury, decreased concentration and agitation.      Objective:   Physical Exam BP 140/80  Pulse 68  Temp(Src) 98.2 F (36.8 C) (Oral)  Ht 5\' 4"  (1.626 m)  Wt 211 lb (95.709 kg)  BMI 36.2 kg/m2  SpO2 97% VS noted,  Constitutional: Pt is oriented to person, place, and time. Appears well-developed and well-nourished. Lavella Lemons Head: Normocephalic and atraumatic.  Right Ear: External ear normal.  Left Ear: External ear normal.  Nose: Nose normal.  Mouth/Throat: Oropharynx is clear and moist.  Eyes: Conjunctivae and EOM are normal. Pupils are equal, round, and reactive to light.  Neck: Normal range of motion. Neck supple. No JVD present. No tracheal deviation present.  Cardiovascular: Normal rate, regular rhythm, normal heart sounds and intact distal pulses.   Pulmonary/Chest: Effort normal and breath sounds normal.  Abdominal: Soft. Bowel sounds are normal. There is no tenderness. No HSM  Musculoskeletal: Normal range of motion. Exhibits no edema.  Lymphadenopathy:  Has no cervical adenopathy.  Neurological: Pt is alert and oriented to person, place, and time. Pt has normal reflexes. No cranial nerve deficit.  Skin: Skin is warm and dry. No rash noted.  Psychiatric:  Has  normal mood  and affect. Behavior is normal.     Assessment & Plan:

## 2012-05-09 ENCOUNTER — Ambulatory Visit (INDEPENDENT_AMBULATORY_CARE_PROVIDER_SITE_OTHER)
Admission: RE | Admit: 2012-05-09 | Discharge: 2012-05-09 | Disposition: A | Discharge status: Home / Self Care | Payer: 59 | Source: Ambulatory Visit | Attending: Internal Medicine | Admitting: Internal Medicine

## 2012-05-09 DIAGNOSIS — N951 Menopausal and female climacteric states: Secondary | ICD-10-CM

## 2012-05-11 ENCOUNTER — Telehealth: Payer: Self-pay

## 2012-05-11 MED ORDER — OMEPRAZOLE 20 MG PO CPDR: 40.0000 mg | DELAYED_RELEASE_CAPSULE | Freq: Every day | ORAL | Status: DC

## 2012-05-11 NOTE — Telephone Encounter (Signed)
Done erx 

## 2012-05-11 NOTE — Telephone Encounter (Signed)
Pt called stating that Protonix is causing stomach pain. Pt is requesting to change back to Omeprazole, please advise

## 2012-05-11 NOTE — Telephone Encounter (Signed)
Patient informed. 

## 2012-05-19 ENCOUNTER — Other Ambulatory Visit: Payer: Self-pay | Admitting: Internal Medicine

## 2012-05-23 ENCOUNTER — Ambulatory Visit (INDEPENDENT_AMBULATORY_CARE_PROVIDER_SITE_OTHER): Payer: 59 | Admitting: Women's Health

## 2012-05-23 ENCOUNTER — Ambulatory Visit
Admission: RE | Admit: 2012-05-23 | Discharge: 2012-05-23 | Disposition: A | Discharge status: Home / Self Care | Payer: 59 | Source: Ambulatory Visit | Attending: Gynecology | Admitting: Gynecology

## 2012-05-23 ENCOUNTER — Encounter: Payer: Self-pay | Admitting: Women's Health

## 2012-05-23 VITALS — BP 128/80 | Ht 64.0 in | Wt 208.0 lb

## 2012-05-23 DIAGNOSIS — Z79890 Hormone replacement therapy (postmenopausal): Secondary | ICD-10-CM

## 2012-05-23 DIAGNOSIS — R21 Rash and other nonspecific skin eruption: Secondary | ICD-10-CM

## 2012-05-23 DIAGNOSIS — Z1231 Encounter for screening mammogram for malignant neoplasm of breast: Secondary | ICD-10-CM

## 2012-05-23 DIAGNOSIS — Z01419 Encounter for gynecological examination (general) (routine) without abnormal findings: Secondary | ICD-10-CM

## 2012-05-23 LAB — HM MAMMOGRAPHY: HM Mammogram: NEGATIVE

## 2012-05-23 MED ORDER — NYSTATIN-TRIAMCINOLONE 100000-0.1 UNIT/GM-% EX OINT: TOPICAL_OINTMENT | Freq: Two times a day (BID) | CUTANEOUS | Status: DC

## 2012-05-23 MED ORDER — ESTRADIOL 1 MG PO TABS: 1.0000 mg | ORAL_TABLET | Freq: Every day | ORAL | Status: DC

## 2012-05-23 NOTE — Patient Instructions (Addendum)

## 2012-05-23 NOTE — Progress Notes (Addendum)
Kelli Bell [birth year] [id number]    History:    The patient presents for annual exam.  Postmenopausal TAH with BSO in 04 for menorrhagia on estradiol 2 mg daily with good relief of symptoms. Had a mammogram today a normal mammogram history. DEXA is done through primary care/reports as normal. Hypothyroid primary care manages. Negative colonoscopy 2012. Has had been extremely stressful year has been diagnosed with esophageal cancer, surgery, radiation, chemo, is back to work for the most part. Primary care has her on xanax twice daily.Has had increased urinary frequency with urgency in this past year feeling like she has to empty her bladder every 30 minutes to an hour. Has always had some frequency but has escalated this past year.   Past medical history, past surgical history, family history and social history were all reviewed and documented in the EPIC chart. Homemaker, daughter Sonny Masters having problems with abnormal Paps, has 2 children both doing well, son is doing well.   ROS:  A  ROS was performed and pertinent positives and negatives are included in the history.  Exam:  Filed Vitals:   05/23/12 1153  BP: 128/80    General appearance:  Normal Head/Neck:  Normal, without cervical or supraclavicular adenopathy. Thyroid:  Symmetrical, normal in size, without palpable masses or nodularity. Respiratory  Effort:  Normal  Auscultation:  Clear without wheezing or rhonchi Cardiovascular  Auscultation:  Regular rate, without rubs, murmurs or gallops  Edema/varicosities:  Not grossly evident Abdominal  Soft,nontender, without masses, guarding or rebound.  Liver/spleen:  No organomegaly noted  Hernia:  None appreciated  Skin  Inspection:  Grossly normal/erythematous at pannus.  Palpation:  Grossly normal Neurologic/psychiatric  Orientation:  Normal with appropriate conversation.  Mood/affect:  Normal  Genitourinary    Breasts: Examined lying and sitting.     Right: Without masses,  retractions, discharge or axillary adenopathy.     Left: Without masses, retractions, discharge or axillary adenopathy.   Inguinal/mons:  Normal without inguinal adenopathy  External genitalia:  Normal  BUS/Urethra/Skene's glands:  Normal  Bladder:  Normal  Vagina:  Normal  Cervix:  absent  Uterus:  absent  Adnexa/parametria:     Rt: Without masses or tenderness.   Lt: Without masses or tenderness.  Anus and perineum: Normal  Digital rectal exam: Normal sphincter tone without palpated masses or tenderness  Assessment/Plan:  52 y.o. MWF G2P2  for annual exam.  Postmenopausal on ERT Overactive bladder Situational stressors/husband illness Hypothyroid/anxiety/depression-primary care managing  Plan: Mycolog twice daily to pannus as needed for superficial skin irritation. Instructed to call if no relief of symptoms. SBE's, continue annual mammogram, calcium rich diet, vitamin D 2000 daily encouraged. History of a normal DEXA, home safety and fall prevention discussed. Counseling as needed, has received through cancer center. Overactive bladder symptoms discussed will try VESIcare 5 mg daily for 2 weeks call if no relief, or would like to continue. Estrace 1 mg prescription, proper use given and reviewed had been on 2 mg. Reviewed slight risk for blood clots, strokes, breast cancer.  Labs and home Hemoccult card done through primary care.   Harrington Challenger [facility], 12:37 PM 05/23/2012

## 2012-05-31 ENCOUNTER — Telehealth: Payer: Self-pay | Admitting: *Deleted

## 2012-05-31 ENCOUNTER — Encounter: Payer: Self-pay | Admitting: Internal Medicine

## 2012-05-31 DIAGNOSIS — I10 Essential (primary) hypertension: Secondary | ICD-10-CM

## 2012-05-31 HISTORY — DX: Essential (primary) hypertension: I10

## 2012-05-31 MED ORDER — LOSARTAN POTASSIUM 50 MG PO TABS: 50.0000 mg | ORAL_TABLET | Freq: Every day | ORAL | Status: DC

## 2012-05-31 NOTE — Telephone Encounter (Signed)
Pt calling to let MD know that her BP and triglycerides are still elevated. Pt had a cardiovascular risk screening done yesterday and her BP was 140/79. Triglycerides were 307 compared to 238 at last OV. Pt states that she has been having HA for last few days and Jaw pain and left side. She is asking for MD's advisement on whether she needs another ECG or referral to cardiology.

## 2012-05-31 NOTE — Telephone Encounter (Signed)
Ok for losartan 50 mg per day for blood pressure, though we could go to 100 mg if this does not help  Elevated trig's tx involves wt loss if overwt, low fat diet, and medication only if > 500, ok to hold med for this  Not sure what to say about the HA and jaw pain, but this would be VERY unusual for any type of heart problem; I would consider OV if persists, worsens, or fever

## 2012-05-31 NOTE — Telephone Encounter (Signed)
Patient informed. 

## 2012-06-04 ENCOUNTER — Other Ambulatory Visit: Payer: Self-pay | Admitting: Women's Health

## 2012-06-15 ENCOUNTER — Telehealth: Payer: Self-pay | Admitting: *Deleted

## 2012-06-15 MED ORDER — SOLIFENACIN SUCCINATE 5 MG PO TABS: ORAL_TABLET | ORAL | Status: DC

## 2012-06-15 NOTE — Telephone Encounter (Signed)
Pt called stating the samples of VESIcare 5 mg tablets on OV on 05/23/12. Pt said pill worked great and would like a Rx, I will send rx per nancy note.

## 2012-06-16 ENCOUNTER — Telehealth: Payer: Self-pay | Admitting: *Deleted

## 2012-06-16 ENCOUNTER — Telehealth: Payer: Self-pay

## 2012-06-16 ENCOUNTER — Other Ambulatory Visit: Payer: Self-pay | Admitting: Women's Health

## 2012-06-16 MED ORDER — OXYBUTYNIN CHLORIDE ER 10 MG PO TB24: 10.0000 mg | ORAL_TABLET | Freq: Every day | ORAL | Status: DC

## 2012-06-16 NOTE — Telephone Encounter (Signed)
Prior authorization for Vesicare 5 mg faxed to [employer].

## 2012-06-16 NOTE — Telephone Encounter (Signed)
We received a note from [facility] saying that patient had called the insurance company and they will pay for Oxybutynin.  They asked you to please send a prescription "as patient really needs this".  They had marked out the Vesicare and I am guessing her insurance would not cover it.

## 2012-06-16 NOTE — Telephone Encounter (Signed)
Medication was denied by insurance company medication is excluded under her plan. Pharmacy informed with this as well.

## 2012-06-16 NOTE — Telephone Encounter (Signed)
Molli Knock can try oxybutynin 10 mg daily #30 with 6 refills, call if problems with constipation or dry mouth.

## 2012-07-19 ENCOUNTER — Other Ambulatory Visit: Payer: Self-pay

## 2012-07-19 DIAGNOSIS — F419 Anxiety disorder, unspecified: Secondary | ICD-10-CM

## 2012-07-19 MED ORDER — ALPRAZOLAM 0.25 MG PO TABS: ORAL_TABLET | ORAL | Status: DC

## 2012-07-19 NOTE — Telephone Encounter (Signed)
Done hardcopy to [facility]  

## 2012-07-20 NOTE — Telephone Encounter (Signed)
Faxed hardcopy to [facility]

## 2012-10-14 ENCOUNTER — Other Ambulatory Visit: Payer: Self-pay

## 2012-10-14 DIAGNOSIS — F419 Anxiety disorder, unspecified: Secondary | ICD-10-CM

## 2012-10-14 MED ORDER — ALPRAZOLAM 0.25 MG PO TABS: ORAL_TABLET | ORAL | Status: DC

## 2012-10-14 NOTE — Telephone Encounter (Signed)
Done hardcopy to [facility]  

## 2012-10-14 NOTE — Telephone Encounter (Signed)
Faxed hardcopy to [facility]

## 2012-11-18 ENCOUNTER — Ambulatory Visit (INDEPENDENT_AMBULATORY_CARE_PROVIDER_SITE_OTHER): Payer: 59 | Admitting: Internal Medicine

## 2012-11-18 ENCOUNTER — Encounter: Payer: Self-pay | Admitting: Internal Medicine

## 2012-11-18 VITALS — BP 112/78 | HR 68 | Temp 98.7°F | Ht 64.0 in | Wt 199.5 lb

## 2012-11-18 DIAGNOSIS — M545 Low back pain, unspecified: Secondary | ICD-10-CM

## 2012-11-18 DIAGNOSIS — F3289 Other specified depressive episodes: Secondary | ICD-10-CM

## 2012-11-18 DIAGNOSIS — F329 Major depressive disorder, single episode, unspecified: Secondary | ICD-10-CM

## 2012-11-18 DIAGNOSIS — J019 Acute sinusitis, unspecified: Secondary | ICD-10-CM

## 2012-11-18 DIAGNOSIS — G8929 Other chronic pain: Secondary | ICD-10-CM

## 2012-11-18 DIAGNOSIS — I10 Essential (primary) hypertension: Secondary | ICD-10-CM

## 2012-11-18 MED ORDER — TRAMADOL HCL 50 MG PO TABS: 50.0000 mg | ORAL_TABLET | Freq: Four times a day (QID) | ORAL | Status: DC | PRN

## 2012-11-18 MED ORDER — LEVOFLOXACIN 500 MG PO TABS: 500.0000 mg | ORAL_TABLET | Freq: Every day | ORAL | Status: DC

## 2012-11-18 MED ORDER — FLUCONAZOLE 150 MG PO TABS: ORAL_TABLET | ORAL | Status: DC

## 2012-11-18 NOTE — Progress Notes (Signed)
Subjective:    Patient ID: Kelli Bell, female    DOB: Aug 05, 1960, 52 y.o.   MRN: [id number]  HPI   Here with 2-3 days acute onset fever, facial pain, pressure, headache, general weakness and malaise, and greenish d/c, with mild ST and cough, but pt denies chest pain, wheezing, increased sob or doe, orthopnea, PND, increased LE swelling, palpitations, dizziness or syncope.  Pt denies new neurological symptoms such as new headache, or facial or extremity weakness or numbness   Pt denies polydipsia, polyuria.  Denies worsening depressive symptoms, suicidal ideation, or panic  Pt continues to have recurring LBP without change in severity, bowel or bladder change, fever, wt loss,  worsening LE pain/numbness/weakness, gait change or falls.  Past Medical History  Diagnosis Date  . ANXIETY 03/30/2007  . BACK PAIN 02/20/2009  . DEPRESSION 03/30/2007  . Dysuria 02/20/2009  . EPIGASTRIC PAIN 11/01/2009  . FATIGUE 06/29/2007  . GERD 01/13/2007  . HYPERLIPIDEMIA 02/22/2008  . MENOPAUSAL DISORDER 02/23/2008  . OBSTRUCTIVE SLEEP APNEA 06/29/2007  . SINUSITIS- ACUTE-NOS 03/30/2007  . THORACIC/LUMBOSACRAL NEURITIS/RADICULITIS UNSPEC 02/23/2008  . VERTIGO 11/23/2007  . Wheezing 01/08/2009  . HYPOTHYROIDISM 03/30/2007  . Chronic low back pain 02/06/2012  . Essential hypertension, benign 05/31/2012   Past Surgical History  Procedure Laterality Date  . Cholecystectomy  1980  . Jaw bone surgury      52 yo  . Abdominal hysterectomy  2004    with BSO  . Oophorectomy  2004    BSO with Hysterectomy  . Tonsillectomy      reports that she has quit smoking. She has never used smokeless tobacco. She reports that she does not drink alcohol or use illicit drugs. family history includes Arthritis in her other; Asthma in her brother; Breast cancer in her maternal aunt; Cancer in her mother; Colon polyps (age of onset: 68) in her father; Diabetes in her mother and paternal grandmother; Heart disease in her paternal grandmother;  Hypertension in her mother; Lymphoma in her maternal grandmother; Osteoporosis in her other; Stroke in her mother; Thyroid disease in her maternal grandmother and mother. There is no history of Colon cancer. Allergies  Allergen Reactions  . Codeine Nausea Only   Current Outpatient Prescriptions on File Prior to Visit  Medication Sig Dispense Refill  . ALPRAZolam (XANAX) 0.25 MG tablet 1 tab by mouth twice per day as needed  60 tablet  2  . aspirin 81 MG tablet Take 81 mg by mouth daily.      . Calcium Carbonate-Vitamin D (CALTRATE 600+D) 600-400 MG-UNIT per tablet Take 1 tablet by mouth 2 (two) times daily.        . Cholecalciferol (VITAMIN D3) 1000 UNITS CAPS Take by mouth 2 (two) times daily.        . Cyanocobalamin (VITAMIN B 12 PO) Take by mouth 2 (two) times daily.        Marland Kitchen estradiol (ESTRACE) 1 MG tablet Take 1 tablet (1 mg total) by mouth daily.  90 tablet  4  . levothyroxine (SYNTHROID, LEVOTHROID) 75 MCG tablet TAKE ONE TABLET BY MOUTH ONE TIME DAILY  90 tablet  2  . losartan (COZAAR) 50 MG tablet Take 1 tablet (50 mg total) by mouth daily.  90 tablet  3  . Multiple Vitamin (MULTIVITAMIN) capsule Take 1 capsule by mouth daily.        Marland Kitchen nystatin-triamcinolone ointment (MYCOLOG) Apply topically 2 (two) times daily.  60 g  0  . Omega-3 Fatty Acids (FISH OIL)  1000 MG CAPS Take by mouth 2 (two) times daily.        Marland Kitchen omeprazole (PRILOSEC) 20 MG capsule Take 2 capsules (40 mg total) by mouth daily.  180 capsule  3  . oxybutynin (DITROPAN-XL) 10 MG 24 hr tablet Take 1 tablet (10 mg total) by mouth daily.  30 tablet  6   No current facility-administered medications on file prior to visit.   Review of Systems  Constitutional: Negative for unexpected weight change, or unusual diaphoresis  HENT: Negative for tinnitus.   Eyes: Negative for photophobia and visual disturbance.  Respiratory: Negative for choking and stridor.   Gastrointestinal: Negative for vomiting and blood in stool.   Genitourinary: Negative for hematuria and decreased urine volume.  Musculoskeletal: Negative for acute joint swelling Skin: Negative for color change and wound.  Neurological: Negative for tremors and numbness other than noted  Psychiatric/Behavioral: Negative for decreased concentration or  hyperactivity.       Objective:   Physical Exam BP 112/78  Pulse 68  Temp(Src) 98.7 F (37.1 C) (Oral)  Ht 5\' 4"  (1.626 m)  Wt 199 lb 8 oz (90.493 kg)  BMI 34.23 kg/m2  SpO2 98% VS noted, mild ill Constitutional: Pt appears well-developed and well-nourished.  HENT: Head: NCAT.  Right Ear: External ear normal.  Left Ear: External ear normal.  Bilat tm's with mild erythema.  Max sinus areas mild tender.  Pharynx with mild erythema, no exudate Eyes: Conjunctivae and EOM are normal. Pupils are equal, round, and reactive to light.  Neck: Normal range of motion. Neck supple.  Cardiovascular: Normal rate and regular rhythm.   Pulmonary/Chest: Effort normal and breath sounds normal.  Neurological: Pt is alert. Not confused , motor 5/5, gait intact Skin: Skin is warm. No erythema.  Psychiatric: Pt behavior is normal. Thought content normal. not depressed affet    Assessment & Plan:

## 2012-11-18 NOTE — Patient Instructions (Addendum)
Please take all new medication as prescribed Please continue all other medications as before, and refills have been done if requested. Please have the pharmacy call with any other refills you may need. Please continue your efforts at being more active, low cholesterol diet, and weight control.  Please remember to sign up for My Chart if you have not done so, as this will be important to you in the future with finding out test results, communicating by private email, and scheduling acute appointments online when needed.  Please return in About April 2015, or sooner if needed, with Lab testing done 3-5 days before

## 2012-11-20 NOTE — Assessment & Plan Note (Signed)
stable overall by history and exam, recent data reviewed with pt, and pt to continue medical treatment as before,  to f/u any worsening symptoms or concerns Lab Results  Component Value Date   WBC 5.6 04/29/2012   HGB 12.5 04/29/2012   HCT 36.4 04/29/2012   PLT 346.0 04/29/2012   GLUCOSE 98 04/29/2012   CHOL 208* 04/29/2012   TRIG 238.0* 04/29/2012   HDL 75.10 04/29/2012   LDLDIRECT 94.8 04/29/2012   LDLCALC 120* 01/13/2007   ALT 13 04/29/2012   AST 16 04/29/2012   NA 137 04/29/2012   K 4.0 04/29/2012   CL 101 04/29/2012   CREATININE 0.6 04/29/2012   BUN 16 04/29/2012   CO2 27 04/29/2012   TSH 3.71 04/29/2012

## 2012-11-20 NOTE — Assessment & Plan Note (Signed)
stable overall by history and exam,, and pt to continue medical treatment as before,  to f/u any worsening symptoms or concerns, for med refill 

## 2012-11-20 NOTE — Assessment & Plan Note (Signed)
stable overall by history and exam, recent data reviewed with pt, and pt to continue medical treatment as before,  to f/u any worsening symptoms or concerns BP Readings from Last 3 Encounters:  11/18/12 112/78  05/23/12 128/80  05/06/12 140/80

## 2012-11-20 NOTE — Assessment & Plan Note (Signed)
Mild to mod, for antibx course,  to f/u any worsening symptoms or concerns 

## 2012-12-01 ENCOUNTER — Other Ambulatory Visit: Payer: Self-pay

## 2013-01-02 ENCOUNTER — Other Ambulatory Visit: Payer: Self-pay | Admitting: Women's Health

## 2013-01-06 ENCOUNTER — Other Ambulatory Visit: Payer: Self-pay | Admitting: Internal Medicine

## 2013-01-06 NOTE — Telephone Encounter (Signed)
Faxed hardcopy to [facility] 

## 2013-01-06 NOTE — Telephone Encounter (Signed)
Done hardcopy to [facility]  

## 2013-01-29 ENCOUNTER — Other Ambulatory Visit: Payer: Self-pay | Admitting: Internal Medicine

## 2013-01-31 ENCOUNTER — Encounter: Payer: Self-pay | Admitting: Nurse Practitioner

## 2013-01-31 ENCOUNTER — Ambulatory Visit (INDEPENDENT_AMBULATORY_CARE_PROVIDER_SITE_OTHER): Payer: Managed Care, Other (non HMO) | Admitting: Nurse Practitioner

## 2013-01-31 VITALS — BP 120/70 | HR 68 | Temp 97.9°F | Ht 64.0 in | Wt 201.5 lb

## 2013-01-31 DIAGNOSIS — H6591 Unspecified nonsuppurative otitis media, right ear: Secondary | ICD-10-CM

## 2013-01-31 DIAGNOSIS — H6590 Unspecified nonsuppurative otitis media, unspecified ear: Secondary | ICD-10-CM

## 2013-01-31 DIAGNOSIS — J019 Acute sinusitis, unspecified: Secondary | ICD-10-CM

## 2013-01-31 MED ORDER — FLUTICASONE PROPIONATE 50 MCG/ACT NA SUSP: 1.0000 | Freq: Two times a day (BID) | NASAL | Status: DC

## 2013-01-31 MED ORDER — AMOXICILLIN-POT CLAVULANATE 875-125 MG PO TABS: 1.0000 | ORAL_TABLET | Freq: Two times a day (BID) | ORAL | Status: DC

## 2013-01-31 NOTE — Patient Instructions (Signed)
I think you will get better without an antibiotic, however, I have prescribed one that may be filled in 3 days if you are not feeling better. In the meantime start daily sinus rinses. You may add 2-3 drops affrin nasal spray to rinse for 3 consecutive days, then stop for 2 days, then resume for 3 if needed. Use flonase twice daily for at least 14 days to facilitiate drainage of middle ear. Use ibuprophen for ear pain. Feel better!  Otitis Media with Effusion Otitis media with effusion is the presence of fluid in the middle ear. This is a common problem that often follows ear infections. It may be present for weeks or longer after the infection. Unlike an acute ear infection, otits media with effusion refers only to fluid behind the ear drum and not infection. Children with repeated ear and sinus infections and allergy problems are the most likely to get otitis media with effusion. CAUSES  The most frequent cause of the fluid buildup is dysfunction of the eustacian tubes. These are the tubes that drain fluid in the ears to the throat. SYMPTOMS   The main symptom of this condition is hearing loss. As a result, you or your child may:  Listen to the TV at a loud volume.  Not respond to questions.  Ask "what" often when spoken to.  There may be a sensation of fullness or pressure but usually not pain. DIAGNOSIS   Your caregiver will diagnose this condition by examining you or your child's ears.  Your caregiver may test the pressure in you or your child's ear with a tympanometer.  A hearing test may be conducted if the problem persists.  A caregiver will want to re-evaluate the condition periodically to see if it improves. TREATMENT   Treatment depends on the duration and the effects of the effusion.  Antibiotics, decongestants, nose drops, and cortisone-type drugs may not be helpful.  Children with persistent ear effusions may have delayed language. Children at risk for developmental  delays in hearing, learning, and speech may require referral to a specialist earlier than children not at risk.  You or your child's caregiver may suggest a referral to an Ear, Nose, and Throat (ENT) surgeon for treatment. The following may help restore normal hearing:  Drainage of fluid.  Placement of ear tubes (tympanostomy tubes).  Removal of adenoids (adenoidectomy). HOME CARE INSTRUCTIONS   Avoid second hand smoke.  Infants who are breast fed are less likely to have this condition.  Avoid feeding infants while laying flat.  Avoid known environmental allergens.  Be sure to see a caregiver or an ENT specialist for follow up.  Avoid people who are sick. SEEK MEDICAL CARE IF:   Hearing is not better in 3 months.  Hearing is worse.  Ear pain.  Drainage from the ear.  Dizziness. Document Released: 02/20/2004 Document Revised: 04/06/2011 Document Reviewed: 08/09/2012 [facility] Patient Information 2014 [place], [place].

## 2013-01-31 NOTE — Progress Notes (Signed)
Pre-visit discussion using our clinic review tool. No additional management support is needed unless otherwise documented below in the visit note.  

## 2013-01-31 NOTE — Progress Notes (Signed)
   Subjective:    Patient ID: Kelli Bell, female    DOB: Aug 14, 1960, 53 y.o.   MRN: [id number]  Sinusitis This is a recurrent problem. The current episode started in the past 7 days. The problem is unchanged. There has been no fever. The pain is mild. Associated symptoms include congestion, coughing, ear pain (R), headaches (frontal & Left side neck), neck pain (Left side), sinus pressure and a sore throat. Pertinent negatives include no chills, shortness of breath or swollen glands. Treatments tried: treated w/septra about 6 weeks ago, got better, now having symptoms again. The treatment provided significant relief.      Review of Systems  Constitutional: Positive for fatigue. Negative for fever and chills.  HENT: Positive for congestion, ear pain (R), sinus pressure and sore throat.   Respiratory: Positive for cough. Negative for chest tightness, shortness of breath and wheezing.   Cardiovascular: Negative for chest pain.  Gastrointestinal: Negative for abdominal pain.  Musculoskeletal: Positive for neck pain (Left side). Negative for back pain.  Neurological: Positive for headaches (frontal & Left side neck).       Objective:   Physical Exam  Vitals reviewed. Constitutional: She is oriented to person, place, and time. She appears well-developed and well-nourished. No distress.  HENT:  Head: Normocephalic and atraumatic.  Right Ear: External ear normal.  Left Ear: External ear normal.  Mouth/Throat: Oropharynx is clear and moist. No oropharyngeal exudate.  R TM bulging , clear effusion, vessels injected. L TM clear effusion o/w nml. Nasal quality to voice.  Eyes: Conjunctivae are normal. Right eye exhibits no discharge. Left eye exhibits no discharge.  Neck: Normal range of motion. Neck supple. No thyromegaly present.  L side neck-[place]. Muscle feels tight.  Cardiovascular: Normal rate, regular rhythm and normal heart sounds.   No murmur heard. Pulmonary/Chest: Effort  normal and breath sounds normal. No respiratory distress. She has no wheezes.  Lymphadenopathy:    She has no cervical adenopathy.  Neurological: She is alert and oriented to person, place, and time.  Skin: Skin is warm and dry.  Psychiatric: She has a normal mood and affect. Her behavior is normal. Thought content normal.          Assessment & Plan:  1. Acute sinus infection Likely viral. See pt instructions. - amoxicillin-clavulanate (AUGMENTIN) 875-125 MG per tablet; Take 1 tablet by mouth 2 (two) times daily. Do not fill before 1/9.  Dispense: 10 tablet; Refill: 0  2. Otitis media with effusion, right - fluticasone (FLONASE) 50 MCG/ACT nasal spray; Place 1 spray into both nostrils 2 (two) times daily.  Dispense: 16 g; Refill: 3

## 2013-02-02 ENCOUNTER — Telehealth: Payer: Self-pay | Admitting: Nurse Practitioner

## 2013-02-02 ENCOUNTER — Other Ambulatory Visit: Payer: Self-pay | Admitting: Nurse Practitioner

## 2013-02-02 DIAGNOSIS — H6591 Unspecified nonsuppurative otitis media, right ear: Secondary | ICD-10-CM

## 2013-02-02 DIAGNOSIS — J019 Acute sinusitis, unspecified: Secondary | ICD-10-CM

## 2013-02-02 MED ORDER — AMOXICILLIN-POT CLAVULANATE 875-125 MG PO TABS: 1.0000 | ORAL_TABLET | Freq: Two times a day (BID) | ORAL | Status: DC

## 2013-02-02 NOTE — Telephone Encounter (Signed)
Patient is requesting to fill Rx for antibiotic today. She is no better. Please call her.

## 2013-02-02 NOTE — Progress Notes (Signed)
Patient notified

## 2013-02-02 NOTE — Telephone Encounter (Signed)
Returned patient's call. Patient stated that she can not get rid of her headache. Patient wanted to know if you would let her get antibiotic filled today. Please advise?

## 2013-03-06 ENCOUNTER — Other Ambulatory Visit: Payer: Self-pay | Admitting: Internal Medicine

## 2013-03-07 NOTE — Telephone Encounter (Signed)
Faxed hardcopy to [facility] [address] [place] 

## 2013-03-07 NOTE — Telephone Encounter (Signed)
Done hardcopy to [facility]  

## 2013-03-09 ENCOUNTER — Other Ambulatory Visit: Payer: Self-pay

## 2013-03-09 DIAGNOSIS — Z1231 Encounter for screening mammogram for malignant neoplasm of breast: Secondary | ICD-10-CM

## 2013-03-22 ENCOUNTER — Other Ambulatory Visit: Payer: Self-pay | Admitting: Internal Medicine

## 2013-03-22 NOTE — Telephone Encounter (Signed)
Done hardcopy to [facility]  

## 2013-03-22 NOTE — Telephone Encounter (Signed)
Faxed hardcopy to [facility] [place] 

## 2013-04-12 ENCOUNTER — Other Ambulatory Visit (INDEPENDENT_AMBULATORY_CARE_PROVIDER_SITE_OTHER): Payer: Self-pay

## 2013-04-12 ENCOUNTER — Ambulatory Visit (INDEPENDENT_AMBULATORY_CARE_PROVIDER_SITE_OTHER): Payer: Managed Care, Other (non HMO) | Admitting: Surgery

## 2013-04-12 ENCOUNTER — Encounter (INDEPENDENT_AMBULATORY_CARE_PROVIDER_SITE_OTHER): Payer: Self-pay | Admitting: Surgery

## 2013-04-12 DIAGNOSIS — Z1231 Encounter for screening mammogram for malignant neoplasm of breast: Secondary | ICD-10-CM

## 2013-04-12 DIAGNOSIS — Z0000 Encounter for general adult medical examination without abnormal findings: Secondary | ICD-10-CM

## 2013-04-12 NOTE — Progress Notes (Signed)
Re:   Kelli Bell DOB:   [birth year] MRN:   [id number]  ASSESSMENT AND PLAN: 1.  Morbid obesity   Initial weight - 206, BMI - 36.6  Per the [facility], the patient is a candidate for bariatric surgery.  The patient attended our initial information session and reviewed the types of bariatric surgery.    The patient is interested in the Roux en Y Gastric Bypass.  I discussed with the patient the indications and risks of bariatric surgery.  The potential risks of surgery include, but are not limited to, bleeding, infection, leak from the bowel, DVT and PE, open surgery, long term nutrition consequences, and death.  The patient understands the importance of compliance and long term follow-up with our group after surgery.  From here we will obtain lab tests, x-rays, nutrition consult, and psych consult.  She is seeing Dr. Marshall Bell 05/10/2103, which will be good before surgery.  2.  Back pain  Chronic - gets injections - sees Dr. Koleen Bell 3.  History of anxiety/depression 4.  OSA  Since 2009 - on CPAP 5.  HTN x 2 years 6.  Rare migraine 7.  Hypothyroid - on replacement  Chief Complaint  Patient presents with  . Bariatric Pre-op    RNY   REFERRING PHYSICIAN: Cathlean Cower, MD  HISTORY OF PRESENT ILLNESS: Kelli Bell is a 53 y.o. (DOB: 09-Dec-1960)  white  female whose primary care physician is Kelli Cower, MD and comes to me today for weight loss surgery.  The patient used our Neurosurgeon.  She has several friends who have had gastric bypass and has done well.  She has tried multiple diets:  Weight Watchers x 3, Physician Wt Loss x 2, low cholesterol diet, low cal, and Slim Fast.  She has had some success with these diets, none lasted.  One thing that she emphasized is that she is always tired.  She has really struggled since her husband was diagnosed with esophageal cancer in 2013.  But he husband is cancer free at this time.   Past Medical History  Diagnosis Date  .  ANXIETY 03/30/2007  . BACK PAIN 02/20/2009  . DEPRESSION 03/30/2007  . Dysuria 02/20/2009  . EPIGASTRIC PAIN 11/01/2009  . FATIGUE 06/29/2007  . GERD 01/13/2007  . HYPERLIPIDEMIA 02/22/2008  . MENOPAUSAL DISORDER 02/23/2008  . OBSTRUCTIVE SLEEP APNEA 06/29/2007  . SINUSITIS- ACUTE-NOS 03/30/2007  . THORACIC/LUMBOSACRAL NEURITIS/RADICULITIS UNSPEC 02/23/2008  . VERTIGO 11/23/2007  . Wheezing 01/08/2009  . HYPOTHYROIDISM 03/30/2007  . Chronic low back pain 02/06/2012  . Essential hypertension, benign 05/31/2012      Past Surgical History  Procedure Laterality Date  . Cholecystectomy  1980  . Jaw bone surgury      53 yo  . Abdominal hysterectomy  2004    with BSO  . Oophorectomy  2004    BSO with Hysterectomy  . Tonsillectomy        Current Outpatient Prescriptions  Medication Sig Dispense Refill  . ALPRAZolam (XANAX) 0.25 MG tablet TAKE ONE TABLET BY MOUTH TWICE DAILY AS NEEDED   60 tablet  2  . aspirin 81 MG tablet Take 81 mg by mouth daily.      . Calcium Carbonate-Vitamin D (CALTRATE 600+D) 600-400 MG-UNIT per tablet Take 1 tablet by mouth 2 (two) times daily.        . Cholecalciferol (VITAMIN D3) 1000 UNITS CAPS Take by mouth 2 (two) times daily.        Marland Kitchen  Cyanocobalamin (VITAMIN B 12 PO) Take by mouth 2 (two) times daily.        Marland Kitchen estradiol (ESTRACE) 1 MG tablet Take 1 tablet (1 mg total) by mouth daily.  90 tablet  4  . fluticasone (FLONASE) 50 MCG/ACT nasal spray Place 1 spray into both nostrils 2 (two) times daily.  16 g  3  . levothyroxine (SYNTHROID, LEVOTHROID) 75 MCG tablet TAKE ONE TABLET BY MOUTH ONE TIME DAILY   30 tablet  5  . losartan (COZAAR) 50 MG tablet Take 1 tablet (50 mg total) by mouth daily.  90 tablet  3  . Multiple Vitamin (MULTIVITAMIN) capsule Take 1 capsule by mouth daily.        Marland Kitchen nystatin-triamcinolone ointment (MYCOLOG) Apply topically 2 (two) times daily.  60 g  0  . Omega-3 Fatty Acids (FISH OIL) 1000 MG CAPS Take by mouth 2 (two) times daily.        Marland Kitchen  omeprazole (PRILOSEC) 20 MG capsule Take 2 capsules (40 mg total) by mouth daily.  180 capsule  3  . oxybutynin (DITROPAN-XL) 10 MG 24 hr tablet TAKE ONE TABLET BY MOUTH ONE TIME DAILY   30 tablet  5  . traMADol (ULTRAM) 50 MG tablet TAKE ONE TABLET BY MOUTH EVERY SIX HOURS AS NEEDED   60 tablet  1  . amoxicillin-clavulanate (AUGMENTIN) 875-125 MG per tablet Take 1 tablet by mouth 2 (two) times daily.  10 tablet  0  . fluconazole (DIFLUCAN) 150 MG tablet 1 tab every 3 days as needed  2 tablet  1  . levofloxacin (LEVAQUIN) 500 MG tablet Take 1 tablet (500 mg total) by mouth daily.  10 tablet  0   No current facility-administered medications for this visit.      Allergies  Allergen Reactions  . Codeine Nausea Only    REVIEW OF SYSTEMS: Skin:  No history of rash.  No history of abnormal moles. Infection:  No history of hepatitis or HIV.  No history of MRSA. Neurologic:  Occasional migraine HA. Cardiac:  HTN x 2 years.  No history of seeing a cardiologist.  She said that she has been told an EKG suggested an old MI, but she has had no further eval for this. Pulmonary:   OSA/CPAP since 2009.  Endocrine:  No diabetes. Hypothyroid, on replacement. Gastrointestinal:  No history of stomach disease.  No history of liver disease. Open chole - 1980.  No history of pancreas disease.  She had a upper endo and colonoscopy by Kelli Bell in 2014. Urologic:  No history of kidney stones.  No history of bladder infections. GYN - Hysterectomy - 2004.  Sees N. Young, NP. Musculoskeletal:  Chronic pain issues with back. She is on chronic tramadol.  She sees Dr. Koleen Bell. Hematologic:  No bleeding disorder.  No history of anemia.  Not anticoagulated. Psycho-social:  The patient is oriented.   She has chronic anxiety and depression, but does not see a therapist.  Dr. Jenny Bell manages this.  SOCIAL and FAMILY HISTORY: Married. Husband, Kelli Bell, had esophageal cancer diagnosed in 2013 (Kelli Bell/Kelli Bell).  He is  disease free at this time.  He works as Hydrologist at [employer]. She does not work. She has two children:  69 yo daughter and 9 yo son.  PHYSICAL EXAM: BP 136/74  Pulse 78  Temp(Src) 98 F (36.7 C)  Resp 18  Ht 5\' 3"  (1.6 m)  Wt 206 lb 6.4 oz (93.622 kg)  BMI 36.57 kg/m2  General: WN WF who is alert and generally healthy appearing.  HEENT: Normal. Pupils equal. Neck: Supple. No mass.  No thyroid mass. Lymph Nodes:  No supraclavicular or cervical nodes. Lungs: Clear to auscultation and symmetric breath sounds. Heart:  RRR. No murmur or rub. Breasts:  Right - no mass  Left - no mass  Abdomen: Soft. No mass. No tenderness. No hernia. Normal bowel sounds.  RUQ transverse scar and pfannenstiel scar.  She is about 1/2 apple and 1/2 pear. Rectal: Not done. Extremities:  Good strength and ROM  in upper and lower extremities. Neurologic:  Grossly intact to motor and sensory function. Psychiatric: Has normal mood and affect. Behavior is normal.   DATA REVIEWED: Epic and notes.  Alphonsa Overall, MD,  [facility], [address]  [place], [place]    [place] Phone:  [phone] FAX:  [phone]

## 2013-04-14 ENCOUNTER — Telehealth: Payer: Self-pay

## 2013-04-14 NOTE — Telephone Encounter (Signed)
Phone call from patient (@ 8:22 today) stating she dropped off a packet of forms for Dr Jenny Reichmann to fill out. She would like to pick these up once forms are filled out.

## 2013-04-14 NOTE — Telephone Encounter (Signed)
Forms are on PCP's desk to be completed.  Will forward note to PCP.

## 2013-04-18 ENCOUNTER — Telehealth: Payer: Self-pay

## 2013-04-18 ENCOUNTER — Other Ambulatory Visit (INDEPENDENT_AMBULATORY_CARE_PROVIDER_SITE_OTHER): Payer: Managed Care, Other (non HMO)

## 2013-04-18 DIAGNOSIS — Z0000 Encounter for general adult medical examination without abnormal findings: Secondary | ICD-10-CM

## 2013-04-18 LAB — BASIC METABOLIC PANEL
BUN: 9 mg/dL (ref 6–23)
CO2: 30 mEq/L (ref 19–32)
Calcium: 9 mg/dL (ref 8.4–10.5)
Chloride: 102 mEq/L (ref 96–112)
Creatinine, Ser: 0.6 mg/dL (ref 0.4–1.2)
GFR: 110.99 mL/min (ref >60.00)
Glucose, Bld: 90 mg/dL (ref 70–99)
Potassium: 4 mEq/L (ref 3.5–5.1)
Sodium: 138 mEq/L (ref 135–145)

## 2013-04-18 LAB — URINALYSIS, ROUTINE W REFLEX MICROSCOPIC
Bilirubin Urine: NEGATIVE
Hgb urine dipstick: NEGATIVE
Ketones, ur: NEGATIVE
Leukocytes, UA: NEGATIVE
Nitrite: NEGATIVE
Specific Gravity, Urine: 1.025 (ref 1.000–1.030)
Total Protein, Urine: NEGATIVE
Urine Glucose: NEGATIVE
Urobilinogen, UA: 0.2 (ref 0.0–1.0)
pH: 6 (ref 5.0–8.0)

## 2013-04-18 LAB — CBC WITH DIFFERENTIAL/PLATELET
Basophils Absolute: 0 10*3/uL (ref 0.0–0.1)
Basophils Relative: 0.5 % (ref 0.0–3.0)
Eosinophils Absolute: 0.1 10*3/uL (ref 0.0–0.7)
Eosinophils Relative: 1.9 % (ref 0.0–5.0)
HCT: 36.6 % (ref 36.0–46.0)
Hemoglobin: 12.4 g/dL (ref 12.0–15.0)
Lymphocytes Relative: 46.1 % — ABNORMAL HIGH (ref 12.0–46.0)
Lymphs Abs: 2.3 10*3/uL (ref 0.7–4.0)
MCHC: 34 g/dL (ref 30.0–36.0)
MCV: 89.6 fl (ref 78.0–100.0)
Monocytes Absolute: 0.5 10*3/uL (ref 0.1–1.0)
Monocytes Relative: 10.3 % (ref 3.0–12.0)
Neutro Abs: 2.1 10*3/uL (ref 1.4–7.7)
Neutrophils Relative %: 41.2 % — ABNORMAL LOW (ref 43.0–77.0)
Platelets: 308 10*3/uL (ref 150.0–400.0)
RBC: 4.09 Mil/uL (ref 3.87–5.11)
RDW: 12.5 % (ref 11.5–14.6)
WBC: 5.1 10*3/uL (ref 4.5–10.5)

## 2013-04-18 LAB — HEPATIC FUNCTION PANEL
ALT: 17 U/L (ref 0–35)
AST: 23 U/L (ref 0–37)
Albumin: 3.9 g/dL (ref 3.5–5.2)
Alkaline Phosphatase: 41 U/L (ref 39–117)
Bilirubin, Direct: 0.1 mg/dL (ref 0.0–0.3)
Total Bilirubin: 0.5 mg/dL (ref 0.3–1.2)
Total Protein: 6.5 g/dL (ref 6.0–8.3)

## 2013-04-18 LAB — LIPID PANEL
Cholesterol: 224 mg/dL — ABNORMAL HIGH (ref 0–200)
HDL: 65.2 mg/dL (ref >39.00)
LDL Cholesterol: 122 mg/dL — ABNORMAL HIGH (ref 0–99)
Total CHOL/HDL Ratio: 3
Triglycerides: 182 mg/dL — ABNORMAL HIGH (ref 0.0–149.0)
VLDL: 36.4 mg/dL (ref 0.0–40.0)

## 2013-04-18 LAB — TSH: TSH: 6.43 u[IU]/mL — ABNORMAL HIGH (ref 0.35–5.50)

## 2013-04-18 NOTE — Telephone Encounter (Signed)
cpx labs entered  

## 2013-04-23 ENCOUNTER — Other Ambulatory Visit: Payer: Self-pay | Admitting: Internal Medicine

## 2013-04-24 NOTE — Telephone Encounter (Signed)
Refill done.  

## 2013-05-05 ENCOUNTER — Encounter (HOSPITAL_COMMUNITY)
Admission: RE | Disposition: A | Discharge status: Home / Self Care | Payer: Managed Care, Other (non HMO) | Source: Ambulatory Visit | Attending: Surgery

## 2013-05-05 ENCOUNTER — Ambulatory Visit (HOSPITAL_COMMUNITY)
Admission: RE | Admit: 2013-05-05 | Discharge: 2013-05-05 | Disposition: A | Discharge status: Home / Self Care | Payer: Managed Care, Other (non HMO) | Source: Ambulatory Visit | Attending: Surgery | Admitting: Surgery

## 2013-05-05 DIAGNOSIS — Z01818 Encounter for other preprocedural examination: Secondary | ICD-10-CM | POA: Insufficient documentation

## 2013-05-05 HISTORY — PX: BREATH TEK H PYLORI: SHX5422

## 2013-05-05 SURGERY — BREATH TEST, FOR HELICOBACTER PYLORI

## 2013-05-05 NOTE — Progress Notes (Signed)
05/05/13 0813  BREATH TEK ASSESSMENT  Referring MD Alphonsa Overall  Time of Last PO Intake 2100 (on 05/04/13)  Baseline Breath At: 0739  Pranactin Given At: 0742  Post-Dose Breath At: 0757  Sample 1 3.0  Sample 2 3.0  Test Negative

## 2013-05-08 ENCOUNTER — Encounter (HOSPITAL_COMMUNITY): Payer: Self-pay | Admitting: Surgery

## 2013-05-08 ENCOUNTER — Other Ambulatory Visit: Payer: Self-pay

## 2013-05-08 ENCOUNTER — Encounter: Payer: Managed Care, Other (non HMO) | Attending: Surgery | Admitting: Dietician

## 2013-05-08 ENCOUNTER — Encounter: Payer: Self-pay | Admitting: Internal Medicine

## 2013-05-08 ENCOUNTER — Ambulatory Visit (HOSPITAL_COMMUNITY)
Admission: RE | Admit: 2013-05-08 | Discharge: 2013-05-08 | Disposition: A | Discharge status: Home / Self Care | Payer: Managed Care, Other (non HMO) | Source: Ambulatory Visit | Attending: Surgery | Admitting: Surgery

## 2013-05-08 DIAGNOSIS — G4733 Obstructive sleep apnea (adult) (pediatric): Secondary | ICD-10-CM | POA: Insufficient documentation

## 2013-05-08 DIAGNOSIS — E039 Hypothyroidism, unspecified: Secondary | ICD-10-CM | POA: Insufficient documentation

## 2013-05-08 DIAGNOSIS — K449 Diaphragmatic hernia without obstruction or gangrene: Secondary | ICD-10-CM | POA: Insufficient documentation

## 2013-05-08 DIAGNOSIS — Z9089 Acquired absence of other organs: Secondary | ICD-10-CM | POA: Insufficient documentation

## 2013-05-08 DIAGNOSIS — K219 Gastro-esophageal reflux disease without esophagitis: Secondary | ICD-10-CM | POA: Insufficient documentation

## 2013-05-08 DIAGNOSIS — Z6836 Body mass index (BMI) 36.0-36.9, adult: Secondary | ICD-10-CM | POA: Insufficient documentation

## 2013-05-08 DIAGNOSIS — I10 Essential (primary) hypertension: Secondary | ICD-10-CM | POA: Insufficient documentation

## 2013-05-08 DIAGNOSIS — Z713 Dietary counseling and surveillance: Secondary | ICD-10-CM | POA: Insufficient documentation

## 2013-05-08 DIAGNOSIS — F341 Dysthymic disorder: Secondary | ICD-10-CM | POA: Insufficient documentation

## 2013-05-08 DIAGNOSIS — M549 Dorsalgia, unspecified: Secondary | ICD-10-CM | POA: Insufficient documentation

## 2013-05-08 DIAGNOSIS — Z01818 Encounter for other preprocedural examination: Secondary | ICD-10-CM | POA: Insufficient documentation

## 2013-05-08 NOTE — Patient Instructions (Signed)
Patient to call the [facility] to enroll in Pre-Op and Post-Op Nutrition Education when surgery date is scheduled. 

## 2013-05-08 NOTE — Progress Notes (Signed)
  Pre-Op Assessment Visit:  Pre-Operative RYGB Surgery  Medical Nutrition Therapy:  Appt start time: 1400   End time:  1500.  Patient was seen on 05/08/2013 for Pre-Operative RYGB Nutrition Assessment. Assessment and letter of approval faxed to [facility] Bariatric Surgery Program coordinator on 05/08/2013.   Preferred Learning Style:  No preference indicated   Learning Readiness:  Ready  Handouts given during visit include:  Pre-Op Goals Bariatric Surgery Protein Shakes  Teaching Method Utilized: Visual Auditory Hands on  Barriers to learning/adherence to lifestyle change: none  Demonstrated degree of understanding via:  Teach Back   Patient to call the [facility] to enroll in Pre-Op and Post-Op Nutrition Education when surgery date is scheduled.

## 2013-05-09 ENCOUNTER — Encounter: Payer: Self-pay | Admitting: Internal Medicine

## 2013-05-09 ENCOUNTER — Ambulatory Visit (INDEPENDENT_AMBULATORY_CARE_PROVIDER_SITE_OTHER): Payer: Managed Care, Other (non HMO) | Admitting: Internal Medicine

## 2013-05-09 VITALS — BP 118/86 | HR 70 | Temp 97.6°F | Ht 63.0 in | Wt 207.1 lb

## 2013-05-09 DIAGNOSIS — Z0000 Encounter for general adult medical examination without abnormal findings: Secondary | ICD-10-CM

## 2013-05-09 MED ORDER — FLUTICASONE PROPIONATE 50 MCG/ACT NA SUSP: 1.0000 | Freq: Two times a day (BID) | NASAL | Status: DC

## 2013-05-09 NOTE — Patient Instructions (Addendum)
Please continue all other medications as before, and refills have been done if requested. Please have the pharmacy call with any other refills you may need.  Please continue your efforts at being more active, low cholesterol diet, and weight control. You are otherwise up to date with prevention measures today.  Please keep your appointments with your specialists as you have planned  No further lab work needed today  Your forms for the surgeon were sent last week  Please return in 1 year for your yearly visit, or sooner if needed, with Lab testing done 3-5 days before  Good Luck with your Surgury!

## 2013-05-09 NOTE — Progress Notes (Signed)
Subjective:    Patient ID: Kelli Bell, female    DOB: 05/23/1960, 53 y.o.   MRN: [id number]  HPI  Here for wellness and f/u;  Overall doing ok;  Pt denies CP, worsening SOB, DOE, wheezing, orthopnea, PND, worsening LE edema, palpitations, dizziness or syncope.  Pt denies neurological change such as new headache, facial or extremity weakness.  Pt denies polydipsia, polyuria, or low sugar symptoms. Pt states overall good compliance with treatment and medications, good tolerability, and has been trying to follow lower cholesterol diet.  Pt denies worsening depressive symptoms, suicidal ideation or panic. No fever, night sweats, wt loss, loss of appetite, or other constitutional symptoms.  Pt states good ability with ADL's, has low fall risk, home safety reviewed and adequate, no other significant changes in hearing or vision, and only occasionally active with exercise.  Pt to have the RouxNY gastric bypass probably later this yr per gen surgury/Dr Lucia Gaskins, now going through preop eval including psych eval next wk.  Looking forward to lower BP, less OSA, better bladder control.  Had her synthroid reduced per GYN to try to "wean off" per pt, but TSH slightly elevated today.  Denies hyper or hypo thyroid symptoms such as voice, skin or hair change. Pt plans to f/u with GYn for this - likely needs to change back to prior dosing.  Past Medical History  Diagnosis Date  . ANXIETY 03/30/2007  . BACK PAIN 02/20/2009  . DEPRESSION 03/30/2007  . Dysuria 02/20/2009  . EPIGASTRIC PAIN 11/01/2009  . FATIGUE 06/29/2007  . GERD 01/13/2007  . HYPERLIPIDEMIA 02/22/2008  . MENOPAUSAL DISORDER 02/23/2008  . OBSTRUCTIVE SLEEP APNEA 06/29/2007  . SINUSITIS- ACUTE-NOS 03/30/2007  . THORACIC/LUMBOSACRAL NEURITIS/RADICULITIS UNSPEC 02/23/2008  . VERTIGO 11/23/2007  . Wheezing 01/08/2009  . HYPOTHYROIDISM 03/30/2007  . Chronic low back pain 02/06/2012  . Essential hypertension, benign 05/31/2012   Past Surgical History  Procedure  Laterality Date  . Cholecystectomy  1980  . Jaw bone surgury      53 yo  . Abdominal hysterectomy  2004    with BSO  . Oophorectomy  2004    BSO with Hysterectomy  . Tonsillectomy    . Breath tek h pylori N/A 05/05/2013    Procedure: BREATH TEK H PYLORI;  Surgeon: Shann Medal, MD;  Location: [facility];  Service: General;  Laterality: N/A;    reports that she has quit smoking. She has never used smokeless tobacco. She reports that she does not drink alcohol or use illicit drugs. family history includes Arthritis in her other; Asthma in her brother; Breast cancer in her maternal aunt; Cancer in her mother; Colon polyps (age of onset: 1) in her father; Diabetes in her mother and paternal grandmother; Heart disease in her paternal grandmother; Hypertension in her mother; Lymphoma in her maternal grandmother; Osteoporosis in her other; Stroke in her mother; Thyroid disease in her maternal grandmother and mother. There is no history of Colon cancer. Allergies  Allergen Reactions  . Codeine Nausea Only   Current Outpatient Prescriptions on File Prior to Visit  Medication Sig Dispense Refill  . ALPRAZolam (XANAX) 0.25 MG tablet TAKE ONE TABLET BY MOUTH TWICE DAILY AS NEEDED   60 tablet  2  . amoxicillin-clavulanate (AUGMENTIN) 875-125 MG per tablet Take 1 tablet by mouth 2 (two) times daily.  10 tablet  0  . aspirin 81 MG tablet Take 81 mg by mouth daily.      . Calcium Carbonate-Vitamin D (CALTRATE 600+D)  600-400 MG-UNIT per tablet Take 1 tablet by mouth 2 (two) times daily.        . Cholecalciferol (VITAMIN D3) 1000 UNITS CAPS Take by mouth 2 (two) times daily.        . Cyanocobalamin (VITAMIN B 12 PO) Take by mouth 2 (two) times daily.        Marland Kitchen estradiol (ESTRACE) 1 MG tablet Take 1 tablet (1 mg total) by mouth daily.  90 tablet  4  . fluconazole (DIFLUCAN) 150 MG tablet 1 tab every 3 days as needed  2 tablet  1  . fluticasone (FLONASE) 50 MCG/ACT nasal spray Place 1 spray into both  nostrils 2 (two) times daily.  16 g  3  . levofloxacin (LEVAQUIN) 500 MG tablet Take 1 tablet (500 mg total) by mouth daily.  10 tablet  0  . levothyroxine (SYNTHROID, LEVOTHROID) 75 MCG tablet TAKE ONE TABLET BY MOUTH ONE TIME DAILY   30 tablet  5  . losartan (COZAAR) 50 MG tablet TAKE ONE TABLET BY MOUTH ONE TIME DAILY   30 tablet  6  . Multiple Vitamin (MULTIVITAMIN) capsule Take 1 capsule by mouth daily.        Marland Kitchen nystatin-triamcinolone ointment (MYCOLOG) Apply topically 2 (two) times daily.  60 g  0  . Omega-3 Fatty Acids (FISH OIL) 1000 MG CAPS Take by mouth 2 (two) times daily.        Marland Kitchen omeprazole (PRILOSEC) 20 MG capsule Take 2 capsules (40 mg total) by mouth daily.  180 capsule  3  . oxybutynin (DITROPAN-XL) 10 MG 24 hr tablet TAKE ONE TABLET BY MOUTH ONE TIME DAILY   30 tablet  5  . traMADol (ULTRAM) 50 MG tablet TAKE ONE TABLET BY MOUTH EVERY SIX HOURS AS NEEDED   60 tablet  1   No current facility-administered medications on file prior to visit.    Review of Systems Constitutional: Negative for diaphoresis, activity change, appetite change or unexpected weight change.  HENT: Negative for hearing loss, ear pain, facial swelling, mouth sores and neck stiffness.   Eyes: Negative for pain, redness and visual disturbance.  Respiratory: Negative for shortness of breath and wheezing.   Cardiovascular: Negative for chest pain and palpitations.  Gastrointestinal: Negative for diarrhea, blood in stool, abdominal distention or other pain Genitourinary: Negative for hematuria, flank pain or change in urine volume.  Musculoskeletal: Negative for myalgias and joint swelling.  Skin: Negative for color change and wound.  Neurological: Negative for syncope and numbness. other than noted Hematological: Negative for adenopathy.  Psychiatric/Behavioral: Negative for hallucinations, self-injury, decreased concentration and agitation.      Objective:   Physical Exam BP 118/86  Pulse 70   Temp(Src) 97.6 F (36.4 C) (Oral)  Ht 5\' 3"  (1.6 m)  Wt 207 lb 2 oz (93.951 kg)  BMI 36.70 kg/m2  SpO2 95% VS noted,  Constitutional: Pt is oriented to person, place, and time. Appears well-developed and well-nourished./obese  Head: Normocephalic and atraumatic.  Right Ear: External ear normal.  Left Ear: External ear normal.  Nose: Nose normal.  Mouth/Throat: Oropharynx is clear and moist.  Eyes: Conjunctivae and EOM are normal. Pupils are equal, round, and reactive to light.  Neck: Normal range of motion. Neck supple. No JVD present. No tracheal deviation present.  Cardiovascular: Normal rate, regular rhythm, normal heart sounds and intact distal pulses.   Pulmonary/Chest: Effort normal and breath sounds normal.  Abdominal: Soft. Bowel sounds are normal. There is no tenderness. No  HSM  Musculoskeletal: Normal range of motion. Exhibits no edema.  Lymphadenopathy:  Has no cervical adenopathy.  Neurological: Pt is alert and oriented to person, place, and time. Pt has normal reflexes. No cranial nerve deficit.  Skin: Skin is warm and dry. No rash noted.  Psychiatric:  Has  normal mood and affect. Behavior is normal.     Assessment & Plan:

## 2013-05-09 NOTE — Progress Notes (Signed)
Pre visit review using our clinic review tool, if applicable. No additional management support is needed unless otherwise documented below in the visit note. 

## 2013-05-09 NOTE — Assessment & Plan Note (Signed)

## 2013-05-16 ENCOUNTER — Other Ambulatory Visit: Payer: Self-pay | Admitting: Women's Health

## 2013-05-16 ENCOUNTER — Telehealth: Payer: Self-pay | Admitting: *Deleted

## 2013-05-16 DIAGNOSIS — E039 Hypothyroidism, unspecified: Secondary | ICD-10-CM

## 2013-05-16 MED ORDER — LEVOTHYROXINE SODIUM 88 MCG PO TABS: 88.0000 ug | ORAL_TABLET | Freq: Every day | ORAL | Status: DC

## 2013-05-16 NOTE — Telephone Encounter (Signed)
Telephone call, states TSH is 6.43, currently on Synthroid 75 mcg will increase to 88 mcg. Process of having gastric bypass surgery. Is aware to recheck TSH in 6 weeks. Rx E. scribed.

## 2013-05-16 NOTE — Telephone Encounter (Signed)
Pt called requesting to speak with you about her recent visit with PCP. States her TSH level is abnormal and dose change is needed in mediction. currently taking levothyroxine 75 mcg. Call back # [phone]

## 2013-05-22 ENCOUNTER — Other Ambulatory Visit: Payer: Self-pay | Admitting: Internal Medicine

## 2013-05-26 ENCOUNTER — Ambulatory Visit
Admission: RE | Admit: 2013-05-26 | Discharge: 2013-05-26 | Disposition: A | Discharge status: Home / Self Care | Payer: Managed Care, Other (non HMO) | Source: Ambulatory Visit

## 2013-05-26 ENCOUNTER — Encounter: Payer: Self-pay | Admitting: Women's Health

## 2013-05-26 ENCOUNTER — Ambulatory Visit (INDEPENDENT_AMBULATORY_CARE_PROVIDER_SITE_OTHER): Payer: Managed Care, Other (non HMO) | Admitting: Women's Health

## 2013-05-26 VITALS — BP 124/74 | Ht 63.0 in | Wt 208.0 lb

## 2013-05-26 DIAGNOSIS — Z79890 Hormone replacement therapy (postmenopausal): Secondary | ICD-10-CM

## 2013-05-26 DIAGNOSIS — N318 Other neuromuscular dysfunction of bladder: Secondary | ICD-10-CM

## 2013-05-26 DIAGNOSIS — N3281 Overactive bladder: Secondary | ICD-10-CM

## 2013-05-26 DIAGNOSIS — R21 Rash and other nonspecific skin eruption: Secondary | ICD-10-CM

## 2013-05-26 DIAGNOSIS — Z01419 Encounter for gynecological examination (general) (routine) without abnormal findings: Secondary | ICD-10-CM

## 2013-05-26 LAB — HM MAMMOGRAPHY

## 2013-05-26 MED ORDER — OXYBUTYNIN CHLORIDE ER 10 MG PO TB24: ORAL_TABLET | ORAL | Status: DC

## 2013-05-26 MED ORDER — NYSTATIN-TRIAMCINOLONE 100000-0.1 UNIT/GM-% EX OINT: TOPICAL_OINTMENT | Freq: Two times a day (BID) | CUTANEOUS | Status: DC

## 2013-05-26 MED ORDER — ESTRADIOL 1 MG PO TABS: 1.0000 mg | ORAL_TABLET | Freq: Every day | ORAL | Status: DC

## 2013-05-26 NOTE — Patient Instructions (Signed)
Health Recommendations for Postmenopausal Women Respected and ongoing research has looked at the most common causes of death, disability, and poor quality of life in postmenopausal women. The causes include heart disease, diseases of blood vessels, diabetes, depression, cancer, and bone loss (osteoporosis). Many things can be done to help lower the chances of developing these and other common problems: CARDIOVASCULAR DISEASE Heart Disease: A heart attack is a medical emergency. Know the signs and symptoms of a heart attack. Below are things women can do to reduce their risk for heart disease.   Do not smoke. If you smoke, quit.  Aim for a healthy weight. Being overweight causes many preventable deaths. Eat a healthy and balanced diet and drink an adequate amount of liquids.  Get moving. Make a commitment to be more physically active. Aim for 30 minutes of activity on most, if not all days of the week.  Eat for heart health. Choose a diet that is low in saturated fat and cholesterol and eliminate trans fat. Include whole grains, vegetables, and fruits. Read and understand the labels on food containers before buying.  Know your numbers. Ask your caregiver to check your blood pressure, cholesterol (total, HDL, LDL, triglycerides) and blood glucose. Work with your caregiver on improving your entire clinical picture.  High blood pressure. Limit or stop your table salt intake (try salt substitute and food seasonings). Avoid salty foods and drinks. Read labels on food containers before buying. Eating well and exercising can help control high blood pressure. STROKE  Stroke is a medical emergency. Stroke may be the result of a blood clot in a blood vessel in the brain or by a brain hemorrhage (bleeding). Know the signs and symptoms of a stroke. To lower the risk of developing a stroke:  Avoid fatty foods.  Quit smoking.  Control your diabetes, blood pressure, and irregular heart rate. THROMBOPHLEBITIS  (BLOOD CLOT) OF THE LEG  Becoming overweight and leading a stationary lifestyle may also contribute to developing blood clots. Controlling your diet and exercising will help lower the risk of developing blood clots. CANCER SCREENING  Breast Cancer: Take steps to reduce your risk of breast cancer.  You should practice "breast self-awareness." This means understanding the normal appearance and feel of your breasts and should include breast self-examination. Any changes detected, no matter how small, should be reported to your caregiver.  After age 40, you should have a clinical breast exam (CBE) every year.  Starting at age 40, you should consider having a mammogram (breast X-ray) every year.  If you have a family history of breast cancer, talk to your caregiver about genetic screening.  If you are at high risk for breast cancer, talk to your caregiver about having an MRI and a mammogram every year.  Intestinal or Stomach Cancer: Tests to consider are a rectal exam, fecal occult blood, sigmoidoscopy, and colonoscopy. Women who are high risk may need to be screened at an earlier age and more often.  Cervical Cancer:  Beginning at age 30, you should have a Pap test every 3 years as long as the past 3 Pap tests have been normal.  If you have had past treatment for cervical cancer or a condition that could lead to cancer, you need Pap tests and screening for cancer for at least 20 years after your treatment.  If you had a hysterectomy for a problem that was not cancer or a condition that could lead to cancer, then you no longer need Pap tests.    If you are between ages 65 and 70, and you have had normal Pap tests going back 10 years, you no longer need Pap tests.  If Pap tests have been discontinued, risk factors (such as a new sexual partner) need to be reassessed to determine if screening should be resumed.  Some medical problems can increase the chance of getting cervical cancer. In these  cases, your caregiver may recommend more frequent screening and Pap tests.  Uterine Cancer: If you have vaginal bleeding after reaching menopause, you should notify your caregiver.  Ovarian cancer: Other than yearly pelvic exams, there are no reliable tests available to screen for ovarian cancer at this time except for yearly pelvic exams.  Lung Cancer: Yearly chest X-rays can detect lung cancer and should be done on high risk women, such as cigarette smokers and women with chronic lung disease (emphysema).  Skin Cancer: A complete body skin exam should be done at your yearly examination. Avoid overexposure to the sun and ultraviolet light lamps. Use a strong sun block cream when in the sun. All of these things are important in lowering the risk of skin cancer. MENOPAUSE Menopause Symptoms: Hormone therapy products are effective for treating symptoms associated with menopause:  Moderate to severe hot flashes.  Night sweats.  Mood swings.  Headaches.  Tiredness.  Loss of sex drive.  Insomnia.  Other symptoms. Hormone replacement carries certain risks, especially in older women. Women who use or are thinking about using estrogen or estrogen with progestin treatments should discuss that with their caregiver. Your caregiver will help you understand the benefits and risks. The ideal dose of hormone replacement therapy is not known. The Food and Drug Administration (FDA) has concluded that hormone therapy should be used only at the lowest doses and for the shortest amount of time to reach treatment goals.  OSTEOPOROSIS Protecting Against Bone Loss and Preventing Fracture: If you use hormone therapy for prevention of bone loss (osteoporosis), the risks for bone loss must outweigh the risk of the therapy. Ask your caregiver about other medications known to be safe and effective for preventing bone loss and fractures. To guard against bone loss or fractures, the following is recommended:  If  you are less than age 50, take 1000 mg of calcium and at least 600 mg of Vitamin D per day.  If you are greater than age 50 but less than age 70, take 1200 mg of calcium and at least 600 mg of Vitamin D per day.  If you are greater than age 70, take 1200 mg of calcium and at least 800 mg of Vitamin D per day. Smoking and excessive alcohol intake increases the risk of osteoporosis. Eat foods rich in calcium and vitamin D and do weight bearing exercises several times a week as your caregiver suggests. DIABETES Diabetes Melitus: If you have Type I or Type 2 diabetes, you should keep your blood sugar under control with diet, exercise and recommended medication. Avoid too many sweets, starchy and fatty foods. Being overweight can make control more difficult. COGNITION AND MEMORY Cognition and Memory: Menopausal hormone therapy is not recommended for the prevention of cognitive disorders such as Alzheimer's disease or memory loss.  DEPRESSION  Depression may occur at any age, but is common in elderly women. The reasons may be because of physical, medical, social (loneliness), or financial problems and needs. If you are experiencing depression because of medical problems and control of symptoms, talk to your caregiver about this. Physical activity and   exercise may help with mood and sleep. Community and volunteer involvement may help your sense of value and worth. If you have depression and you feel that the problem is getting worse or becoming severe, talk to your caregiver about treatment options that are best for you. ACCIDENTS  Accidents are common and can be serious in the elderly woman. Prepare your house to prevent accidents. Eliminate throw rugs, place hand bars in the bath, shower and toilet areas. Avoid wearing high heeled shoes or walking on wet, snowy, and icy areas. Limit or stop driving if you have vision or hearing problems, or you feel you are unsteady with you movements and  reflexes. HEPATITIS C Hepatitis C is a type of viral infection affecting the liver. It is spread mainly through contact with blood from an infected person. It can be treated, but if left untreated, it can lead to severe liver damage over years. Many people who are infected do not know that the virus is in their blood. If you are a "baby-boomer", it is recommended that you have one screening test for Hepatitis C. IMMUNIZATIONS  Several immunizations are important to consider having during your senior years, including:   Tetanus, diptheria, and pertussis booster shot.  Influenza every year before the flu season begins.  Pneumonia vaccine.  Shingles vaccine.  Others as indicated based on your specific needs. Talk to your caregiver about these. Document Released: 03/06/2005 Document Revised: 12/30/2011 Document Reviewed: 10/31/2007 Exit[employer] Patient Information 2014 [employer].  

## 2013-05-26 NOTE — Progress Notes (Signed)
Kelli Bell Aug 26, 1960 [id number]    History:    Presents for annual exam.  2004 TAH with BSO for menorrhagia on estradiol 1 mg. History a normal Paps and mammograms. Negative colonoscopy 2012. Overactive bladder with good relief with pain. Scheduled for gastric bypass the summer. 2014, normal DEXA at primary care.  Past medical history, past surgical history, family history and social history were all reviewed and documented in the EPIC chart. Husband treated for esophageal cancer 2013 with surgery, chemotherapy, radiation doing better, back to work full time. 2 children doing well.  ROS:  A 12 point  ROS was performed and pertinent positives and negatives are included.  Exam:  Filed Vitals:   05/26/13 1454  BP: 124/74    General appearance:  Normal Thyroid:  Symmetrical, normal in size, without palpable masses or nodularity. Respiratory  Auscultation:  Clear without wheezing or rhonchi Cardiovascular  Auscultation:  Regular rate, without rubs, murmurs or gallops  Edema/varicosities:  Not grossly evident Abdominal  Soft,nontender, without masses, guarding or rebound.  Liver/spleen:  No organomegaly noted  Hernia:  None appreciated  Skin  Inspection:  Grossly normal   Breasts: Examined lying and sitting.     Right: Without masses, retractions, discharge or axillary adenopathy.     Left: Without masses, retractions, discharge or axillary adenopathy. Gentitourinary   Inguinal/mons:  Normal without inguinal adenopathy  External genitalia:  Normal  BUS/Urethra/Skene's glands:  Normal  Vagina:  Normal  Cervix:  Absent  Uterus: Absent  Adnexa/parametria:     Rt: Without masses or tenderness.   Lt: Without masses or tenderness.  Anus and perineum: Normal  Digital rectal exam: Normal sphincter tone without palpated masses or tenderness  Assessment/Plan:  53 y.o. MWF G2P2 for annual exam.    2004 TAH with BSO for menorrhagia on estradiol Overactive bladder good relief with  Ditropan Schedule for gastric bypass  Plan: Estradiol 1 mg daily prescription, proper use given and reviewed risk for blood clots, strokes, breast cancer. Reviewed possibly stopping prior to surgery, instructed to discuss with surgeon. Ditropan 10 mg XL prescription, proper use given and reviewed instructed to stop, or decrease use prior to surgery to prevent dehydration and constipation. SBE's, continue annual mammogram, calcium rich diet,  daily vitamin D 2000 daily encouraged. Aware of importance of increasing regular exercise and decreasing calories after weight loss surgery. Labs primary care.    [place], 5:02 PM 05/26/2013

## 2013-05-29 ENCOUNTER — Other Ambulatory Visit: Payer: Self-pay | Admitting: Internal Medicine

## 2013-05-29 NOTE — Telephone Encounter (Signed)
Done hardcopy to [facility]  

## 2013-05-30 ENCOUNTER — Encounter: Payer: Managed Care, Other (non HMO) | Attending: Surgery | Admitting: Dietician

## 2013-05-30 DIAGNOSIS — Z713 Dietary counseling and surveillance: Secondary | ICD-10-CM | POA: Insufficient documentation

## 2013-05-30 DIAGNOSIS — Z6836 Body mass index (BMI) 36.0-36.9, adult: Secondary | ICD-10-CM | POA: Insufficient documentation

## 2013-05-30 DIAGNOSIS — Z01818 Encounter for other preprocedural examination: Secondary | ICD-10-CM | POA: Insufficient documentation

## 2013-05-30 NOTE — Patient Instructions (Signed)
-  Continue to walk as tolerated -Continue working on pre op goals 

## 2013-05-30 NOTE — Telephone Encounter (Signed)
Faxed hardcopy to [employer]. [place]

## 2013-05-30 NOTE — Progress Notes (Signed)
  Medical Nutrition Therapy:  Appt start time: 0945 end time:  1000.   Assessment:  Primary concerns today:  Tanaisha returns today for her second SWL visit. She is also seeing her PCP for SWL appointments. Nelma reports she went to a bariatric support group recently and got several of her questions answered by people who have had the surgery. She has been cutting back on caffeine, sugar and salt in preparation for the surgery. She has also been trying to walk more despite her back pain.  She has been having difficulty with her insurance company. Per [employer]), Christella Scheuermann had initially approved her. However, they are now requiring 3 months of supervised weight loss. I faxed an appeal letter to the insurance company today per patient request.   Preferred Learning Style:   No preference indicated   Learning Readiness:   Ready   MEDICATIONS: coming off of bladder meds and hormones   DIETARY INTAKE:  Usual physical activity: walking as tolerated; back pain  Estimated energy needs: 1600 calories  Progress Towards Goal(s):  In progress.   Nutritional Diagnosis:  [place]-3.3 Overweight/obesity related to past poor dietary habits and physical inactivity as evidenced by patient in SWL for pending RYGB surgery following dietary guidelines for continued weight loss.     Intervention:  Nutrition counseling provided.  Teaching Method Utilized:  Auditory  Barriers to learning/adherence to lifestyle change: insurance issues  Demonstrated degree of understanding via:  Teach Back   Monitoring/Evaluation:  Dietary intake, exercise, and body weight in 4 week(s).

## 2013-06-02 ENCOUNTER — Ambulatory Visit (INDEPENDENT_AMBULATORY_CARE_PROVIDER_SITE_OTHER): Payer: Managed Care, Other (non HMO) | Admitting: Internal Medicine

## 2013-06-02 ENCOUNTER — Encounter: Payer: Self-pay | Admitting: Internal Medicine

## 2013-06-02 VITALS — BP 110/80 | HR 76 | Temp 98.2°F | Ht 63.0 in | Wt 204.2 lb

## 2013-06-02 DIAGNOSIS — E039 Hypothyroidism, unspecified: Secondary | ICD-10-CM

## 2013-06-02 DIAGNOSIS — E785 Hyperlipidemia, unspecified: Secondary | ICD-10-CM

## 2013-06-02 DIAGNOSIS — F411 Generalized anxiety disorder: Secondary | ICD-10-CM

## 2013-06-02 DIAGNOSIS — I10 Essential (primary) hypertension: Secondary | ICD-10-CM

## 2013-06-02 MED ORDER — ALPRAZOLAM 0.25 MG PO TABS: ORAL_TABLET | ORAL | Status: DC

## 2013-06-02 NOTE — Progress Notes (Signed)
Pre visit review using our clinic review tool, if applicable. No additional management support is needed unless otherwise documented below in the visit note. 

## 2013-06-02 NOTE — Assessment & Plan Note (Signed)
stable overall by history and exam, recent data reviewed with pt, and pt to continue medical treatment as before,  to f/u any worsening symptoms or concerns, has planned f/u TSH after recent med increased

## 2013-06-02 NOTE — Assessment & Plan Note (Signed)
stable overall by history and exam, recent data reviewed with pt, and pt to continue medical treatment as before,  to f/u any worsening symptoms or concerns BP Readings from Last 3 Encounters:  06/02/13 110/80  05/26/13 124/74  05/09/13 118/86

## 2013-06-02 NOTE — Assessment & Plan Note (Signed)
Ok for change xanax to .25 tid prn

## 2013-06-02 NOTE — Assessment & Plan Note (Signed)
For eventual bariatric surgury soon

## 2013-06-02 NOTE — Assessment & Plan Note (Signed)
For lower chol diet, o/w stable overall by history and exam, recent data reviewed with pt, and pt to continue medical treatment as before,  to f/u any worsening symptoms or concerns Lab Results  Component Value Date   LDLCALC 122* 04/18/2013

## 2013-06-02 NOTE — Patient Instructions (Signed)
OK to take the xanax as prescribed  Please continue all other medications as before, and refills have been done if requested. Please have the pharmacy call with any other refills you may need.  Please continue your efforts at being more active, low cholesterol diet, and weight control.  Please return in 3 weeks, or sooner if needed

## 2013-06-02 NOTE — Progress Notes (Signed)
Subjective:    Patient ID: Kelli Bell, female    DOB: [birth year], 53 y.o.   MRN: [id number]  HPI  Here to f/u, required to be seen every 21 days here as well as nutritionist for today and 2 more visit after, despite previous letter from me and initial approval for bariatric surgury, quite upsetting and high stress today as it seems to pt the rules and requirements preop have been changed.  Denies worsening depressive symptoms, suicidal ideation, or panic; has ongoing anxiety, much increased recently due to this admin problem.  Has seen nutritionist twice this wk already, and plans to continue as required.  Had 3 lbs wt loss since last visit, though has tried to cut calories, salt, and more walking above baseline 1 mile a day about 3-4 days per wk..  Pt denies chest pain, increased sob or doe, wheezing, orthopnea, PND, increased LE swelling, palpitations, dizziness or syncope.  Has been weaned of HRT, and thyroid med adjusted per GYN to 88 mcg. Has xanax prn.  Has TSH planned re-check in 4 wks.  Still using CPAP, no significnat daytime somnolence.  Trying to follow lower chol diet since elev LDl last visit.  Deos not want further meds at this point such as statin if she is to have gastric bypass soon.  Past Medical History  Diagnosis Date  . ANXIETY 03/30/2007  . BACK PAIN 02/20/2009  . DEPRESSION 03/30/2007  . Dysuria 02/20/2009  . EPIGASTRIC PAIN 11/01/2009  . FATIGUE 06/29/2007  . GERD 01/13/2007  . HYPERLIPIDEMIA 02/22/2008  . MENOPAUSAL DISORDER 02/23/2008  . OBSTRUCTIVE SLEEP APNEA 06/29/2007  . SINUSITIS- ACUTE-NOS 03/30/2007  . THORACIC/LUMBOSACRAL NEURITIS/RADICULITIS UNSPEC 02/23/2008  . VERTIGO 11/23/2007  . Wheezing 01/08/2009  . HYPOTHYROIDISM 03/30/2007  . Chronic low back pain 02/06/2012  . Essential hypertension, benign 05/31/2012   Past Surgical History  Procedure Laterality Date  . Cholecystectomy  1980  . Jaw bone surgury      53 yo  . Abdominal hysterectomy  2004    with BSO  .  Oophorectomy  2004    BSO with Hysterectomy  . Tonsillectomy    . Breath tek h pylori N/A 05/05/2013    Procedure: BREATH TEK H PYLORI;  Surgeon: Shann Medal, MD;  Location: [facility];  Service: General;  Laterality: N/A;    reports that she has quit smoking. She has never used smokeless tobacco. She reports that she does not drink alcohol or use illicit drugs. family history includes Arthritis in her other; Asthma in her brother; Breast cancer in her maternal aunt; Cancer in her mother; Colon polyps (age of onset: 46) in her father; Diabetes in her mother and paternal grandmother; Heart disease in her paternal grandmother; Hypertension in her mother; Lymphoma in her maternal grandmother; Osteoporosis in her other; Stroke in her mother; Thyroid disease in her maternal grandmother and mother. There is no history of Colon cancer. Allergies  Allergen Reactions  . Codeine Nausea Only   Current Outpatient Prescriptions on File Prior to Visit  Medication Sig Dispense Refill  . ALPRAZolam (XANAX) 0.25 MG tablet TAKE ONE TABLET BY MOUTH TWICE DAILY AS NEEDED   60 tablet  2  . aspirin 81 MG tablet Take 81 mg by mouth daily.      . Calcium Carbonate-Vitamin D (CALTRATE 600+D) 600-400 MG-UNIT per tablet Take 1 tablet by mouth 2 (two) times daily.        . Cholecalciferol (VITAMIN D3) 1000 UNITS CAPS Take by  mouth 2 (two) times daily.        . Cyanocobalamin (VITAMIN B 12 PO) Take by mouth 2 (two) times daily.        . fluticasone (FLONASE) 50 MCG/ACT nasal spray Place 1 spray into both nostrils 2 (two) times daily.  16 g  3  . levothyroxine (SYNTHROID) 88 MCG tablet Take 1 tablet (88 mcg total) by mouth daily before breakfast.  30 tablet  6  . losartan (COZAAR) 50 MG tablet TAKE ONE TABLET BY MOUTH ONE TIME DAILY   30 tablet  6  . Multiple Vitamin (MULTIVITAMIN) capsule Take 1 capsule by mouth daily.        Marland Kitchen nystatin-triamcinolone ointment (MYCOLOG) Apply topically 2 (two) times daily.  60 g  0  .  Omega-3 Fatty Acids (FISH OIL) 1000 MG CAPS Take by mouth 2 (two) times daily.        Marland Kitchen omeprazole (PRILOSEC) 20 MG capsule TAKE TWO CAPSULES BY MOUTH DAILY   180 capsule  3  . traMADol (ULTRAM) 50 MG tablet TAKE ONE TABLET BY MOUTH EVERY SIX HOURS AS NEEDED   60 tablet  1   No current facility-administered medications on file prior to visit.   Review of Systems VS noted,  Constitutional: Pt appears well-developed, well-nourished.  HENT: Head: NCAT.  Right Ear: External ear normal.  Left Ear: External ear normal.  Eyes: . Pupils are equal, round, and reactive to light. Conjunctivae and EOM are normal Neck: Normal range of motion. Neck supple.  Cardiovascular: Normal rate and regular rhythm.   Pulmonary/Chest: Effort normal and breath sounds normal.  Abd:  Soft, NT, ND, + BS Neurological: Pt is alert. Not confused , motor grossly intact Skin: Skin is warm. No rash Psychiatric: Pt behavior is normal. No agitation.      Objective:   Physical Exam BP 110/80  Pulse 76  Temp(Src) 98.2 F (36.8 C) (Oral)  Ht 5\' 3"  (1.6 m)  Wt 204 lb 4 oz (92.647 kg)  BMI 36.19 kg/m2  SpO2 97% VS noted,  Constitutional: Pt appears well-developed, well-nourished.  HENT: Head: NCAT.  Right Ear: External ear normal.  Left Ear: External ear normal.  Eyes: . Pupils are equal, round, and reactive to light. Conjunctivae and EOM are normal Neck: Normal range of motion. Neck supple.  Cardiovascular: Normal rate and regular rhythm.   Pulmonary/Chest: Effort normal and breath sounds normal.  Abd:  Soft, NT, ND, + BS Neurological: Pt is alert. Not confused , motor grossly intact Skin: Skin is warm. No rash Psychiatric: Pt behavior is normal. No agitation. 1-2+ nervous        Assessment & Plan:

## 2013-06-03 ENCOUNTER — Telehealth: Payer: Self-pay | Admitting: Internal Medicine

## 2013-06-03 NOTE — Telephone Encounter (Signed)
Relevant patient education assigned to patient using Emmi. ° °

## 2013-06-13 ENCOUNTER — Encounter: Payer: Self-pay | Admitting: Internal Medicine

## 2013-06-22 ENCOUNTER — Encounter: Payer: Managed Care, Other (non HMO) | Admitting: Dietician

## 2013-06-22 NOTE — Patient Instructions (Signed)
-  Continue to walk as tolerated -Continue working on pre op goals 

## 2013-06-22 NOTE — Progress Notes (Signed)
  Medical Nutrition Therapy:  Appt start time: 1200 end time:  1215.   SWL:  Primary concerns today:  Phuong returns today for her second SWL visit. Yehudit reports that she is ready for a lifelong change. She continues to work on her pre op goals and has lost 5 lbs since her last visit. She emailed Sherion today to inquire about SWL completion, as she has also been seeing her PCP. Has lost weight on low cholesterol diet: fruit, lean meat, brown rice, steamed non-starchy vegetables, 1 glass fat free milk, using wheat bread with heart healthy butter.     Preferred Learning Style:   No preference indicated   Learning Readiness:   Ready   MEDICATIONS: off of bladder meds and hormones   DIETARY INTAKE:  Usual physical activity: 3x a week; walking as tolerated and elliptical; back pain  Estimated energy needs: 1600 calories  Progress Towards Goal(s):  In progress.   Nutritional Diagnosis:  [place]-3.3 Overweight/obesity related to past poor dietary habits and physical inactivity as evidenced by patient in SWL for pending RYGB surgery following dietary guidelines for continued weight loss.     Intervention:  Nutrition counseling provided.  Teaching Method Utilized:  Auditory  Barriers to learning/adherence to lifestyle change: insurance issues  Demonstrated degree of understanding via:  Teach Back   Monitoring/Evaluation:  Dietary intake, exercise, and body weight in 4 week(s).

## 2013-06-23 ENCOUNTER — Ambulatory Visit: Payer: Managed Care, Other (non HMO) | Admitting: Internal Medicine

## 2013-07-03 ENCOUNTER — Other Ambulatory Visit: Payer: Self-pay | Admitting: Internal Medicine

## 2013-07-04 NOTE — Telephone Encounter (Signed)
Faxed hardcopy to [employer].

## 2013-07-04 NOTE — Telephone Encounter (Signed)
Done hardcopy to [facility]  

## 2013-07-13 ENCOUNTER — Encounter: Payer: Managed Care, Other (non HMO) | Attending: Surgery | Admitting: Dietician

## 2013-07-13 DIAGNOSIS — Z01818 Encounter for other preprocedural examination: Secondary | ICD-10-CM | POA: Insufficient documentation

## 2013-07-13 DIAGNOSIS — Z6836 Body mass index (BMI) 36.0-36.9, adult: Secondary | ICD-10-CM | POA: Insufficient documentation

## 2013-07-13 DIAGNOSIS — Z713 Dietary counseling and surveillance: Secondary | ICD-10-CM | POA: Insufficient documentation

## 2013-07-13 NOTE — Patient Instructions (Signed)
-  Continue to walk as tolerated -Continue working on pre op goals

## 2013-07-13 NOTE — Progress Notes (Signed)
  Medical Nutrition Therapy:  Appt start time: 200 end time:  215.   SWL:  Primary concerns today:  Kelli Bell returns today discouraged because she has gained 2 pounds since her last visit at [facility]. She reports she is still not having sugar and has cut back on caffeine significantly. She is eating lots of fruits and vegetables and avoiding bread. Kelli Bell continues to engage in light exercise despite back pain.  Preferred Learning Style:   No preference indicated   Learning Readiness:   Ready   MEDICATIONS: still off of bladder meds and hormones   DIETARY INTAKE:  Usual physical activity: 3x a week; walking as tolerated and elliptical; back pain  Estimated energy needs: 1600 calories  Progress Towards Goal(s):  In progress.   Nutritional Diagnosis:  [place]-3.3 Overweight/obesity related to past poor dietary habits and physical inactivity as evidenced by patient in SWL for pending RYGB surgery following dietary guidelines for continued weight loss.     Intervention:  Nutrition counseling provided.  Teaching Method Utilized:  Auditory  Barriers to learning/adherence to lifestyle change: insurance issues  Demonstrated degree of understanding via:  Teach Back   Monitoring/Evaluation:  Dietary intake, exercise, and body weight in 4 week(s).

## 2013-07-14 ENCOUNTER — Ambulatory Visit: Payer: Managed Care, Other (non HMO) | Admitting: Internal Medicine

## 2013-08-08 ENCOUNTER — Encounter: Payer: Managed Care, Other (non HMO) | Attending: Surgery | Admitting: Dietician

## 2013-08-08 ENCOUNTER — Telehealth: Payer: Self-pay | Admitting: *Deleted

## 2013-08-08 ENCOUNTER — Other Ambulatory Visit: Payer: Managed Care, Other (non HMO)

## 2013-08-08 DIAGNOSIS — Z6836 Body mass index (BMI) 36.0-36.9, adult: Secondary | ICD-10-CM | POA: Insufficient documentation

## 2013-08-08 DIAGNOSIS — E039 Hypothyroidism, unspecified: Secondary | ICD-10-CM

## 2013-08-08 DIAGNOSIS — Z713 Dietary counseling and surveillance: Secondary | ICD-10-CM | POA: Insufficient documentation

## 2013-08-08 DIAGNOSIS — Z01818 Encounter for other preprocedural examination: Secondary | ICD-10-CM | POA: Insufficient documentation

## 2013-08-08 LAB — TSH: TSH: 3.935 u[IU]/mL (ref 0.350–4.500)

## 2013-08-08 NOTE — Progress Notes (Signed)
  Medical Nutrition Therapy:  Appt start time: 1000 end time:  1015.   SWL:  Primary concerns today:  Kelli Bell returns today having gained 1 pound since her last visit. Her thyroid has been unstable and she is scheduled for follow up blood work today. [name] has also been on vacation with family for the last 11 days. She reports that she has been under a lot of stress and fears that her husband's cancer is back.     Wt Readings from Last 3 Encounters:  08/08/13 203 lb 6.4 oz (92.262 kg)  07/13/13 202 lb 3.2 oz (91.717 kg)  06/22/13 200 lb 9.6 oz (90.992 kg)   Ht Readings from Last 3 Encounters:  08/08/13 5\' 3"  (1.6 m)  07/13/13 5\' 3"  (1.6 m)  06/22/13 5\' 3"  (1.6 m)   Body mass index is 36.04 kg/(m^2). @BMIFA @ Normalized weight-for-age data available only for age 59 to 85 years. Normalized stature-for-age data available only for age 59 to 24 years.   Preferred Learning Style:   No preference indicated   Learning Readiness:   Ready  Handouts provided: Pre op diet  MEDICATIONS: still off of bladder meds and hormones   DIETARY INTAKE:  Usual physical activity: 3x a week; walking as tolerated and elliptical; back pain  Estimated energy needs: 1600 calories  Progress Towards Goal(s):  In progress.   Nutritional Diagnosis:  [place]-3.3 Overweight/obesity related to past poor dietary habits and physical inactivity as evidenced by patient in SWL for pending RYGB surgery following dietary guidelines for continued weight loss.     Intervention:  Nutrition counseling provided.  Teaching Method Utilized:  Auditory  Barriers to learning/adherence to lifestyle change: insurance issues  Demonstrated degree of understanding via:  Teach Back   Monitoring/Evaluation:  Patient to call [facility] when surgery is scheduled to enroll in preop class.

## 2013-08-08 NOTE — Telephone Encounter (Signed)
Pt called to find out when TSH level should be recheck, per telephone encounter on 05/16/13 it was 6 weeks. Pt will come in today at 10:30am to have lab.

## 2013-08-23 ENCOUNTER — Other Ambulatory Visit (INDEPENDENT_AMBULATORY_CARE_PROVIDER_SITE_OTHER): Payer: Self-pay | Admitting: Surgery

## 2013-08-28 ENCOUNTER — Encounter: Payer: Managed Care, Other (non HMO) | Attending: Surgery

## 2013-08-28 DIAGNOSIS — Z713 Dietary counseling and surveillance: Secondary | ICD-10-CM | POA: Diagnosis not present

## 2013-08-28 DIAGNOSIS — Z01818 Encounter for other preprocedural examination: Secondary | ICD-10-CM | POA: Diagnosis not present

## 2013-08-28 DIAGNOSIS — Z6836 Body mass index (BMI) 36.0-36.9, adult: Secondary | ICD-10-CM | POA: Insufficient documentation

## 2013-08-28 NOTE — Patient Instructions (Signed)
Follow:   Pre-Op Diet per MD 2 weeks prior to surgery  Phase 2- Liquids (clear/full) 2 weeks after surgery  Vitamin/Mineral/Calcium guidelines for purchasing bariatric supplements  Exercise guidelines pre and post-op per MD  Follow-up at [facility] in 2 weeks post-op for diet advancement.  

## 2013-08-28 NOTE — Progress Notes (Signed)
  Pre-Operative Nutrition Class:  Appt start time: 830   End time:  930.  Patient was seen on 08/28/2013 for Pre-Operative Bariatric Surgery Education at [facility].   Surgery date: 09/12/2013 Surgery type: RYGB Start weight at [facility]: 203 lbs on 08/08/2013 Weight today: 203.5  TANITA  BODY COMP RESULTS  08/28/13   BMI (kg/m^2) 36.1   Fat Mass (lbs) 95.0   Fat Free Mass (lbs) 108.5   Total Body Water (lbs) 79.5   Samples given per MNT protocol. Patient educated on appropriate usage: Bariatric Advantage Multivitamin Orange Complete Lot # [id number] Exp: 10/2013  BariActive Calcium Citrate +D3 Lot #[id number] Exp: 06/2014  Celebrate B12 Cherry Lot # [id number] Exp: 03/2014  Premier Protein Zonia Kief Lot #[id number] Exp: 06/08/2014  Renee Pain Protein Powder Chocolate Lot # [id number] Exp: 10/206   The following the learning objectives were met by the patient during this course:  Identify Pre-Op Dietary Goals and will begin 2 weeks pre-operatively  Identify appropriate sources of fluids and proteins   State protein recommendations and appropriate sources pre and post-operatively  Identify Post-Operative Dietary Goals and will follow for 2 weeks post-operatively  Identify appropriate multivitamin and calcium sources  Describe the need for physical activity post-operatively and will follow MD recommendations  State when to call healthcare provider regarding medication questions or post-operative complications  Handouts given during class include:  Pre-Op Bariatric Surgery Diet Handout  Protein Shake Handout  Post-Op Bariatric Surgery Nutrition Handout  BELT Program Information Flyer  Support Group Information Flyer  [facility] Bariatric Supplements Price List  Follow-Up Plan: Patient will follow-up at [facility] 2 weeks post operatively for diet advancement per MD.

## 2013-08-29 ENCOUNTER — Ambulatory Visit: Payer: Managed Care, Other (non HMO) | Admitting: Dietician

## 2013-08-31 ENCOUNTER — Encounter (HOSPITAL_COMMUNITY): Payer: Self-pay | Admitting: Pharmacy Technician

## 2013-08-31 NOTE — Patient Instructions (Addendum)
Maili P Reade  08/31/2013                           YOUR PROCEDURE IS SCHEDULED ON: 09/12/13               ENTER THRU [place] MAIN [facility]OSPITAL ENTRANCE AND                            FOLLOW  SIGNS TO SHORT STAY CENTER                 ARRIVE AT SHORT STAY AT: 5:15 AM               CALL THIS NUMBER IF ANY PROBLEMS THE DAY OF SURGERY :               [phone]                                REMEMBER:   Do not eat food or drink liquids AFTER MIDNIGHT                  Take these medicines the morning of surgery with               A SIPS OF WATER :   LEVOTHYROXINE/ MAY TAKE TRAMADOL AND ALPRAZOLAM  if needed    Do not wear jewelry, make-up   Do not wear lotions, powders, or perfumes.   Do not shave legs or underarms 12 hrs. before surgery (men may shave face)  Do not bring valuables to the hospital.  Contacts, dentures or bridgework may not be worn into surgery.  Leave suitcase in the car. After surgery it may be brought to your room.  For patients admitted to the hospital more than one night, checkout time is            11:00 AM                                                     ________________________________________________________________________                                                                        [place]  Before surgery, you can play an important role.  Because skin is not sterile, your skin needs to be as free of germs as possible.  You can reduce the number of germs on your skin by washing with CHG (chlorahexidine gluconate) soap before surgery.  CHG is an antiseptic cleaner which kills germs and bonds with the skin to continue killing germs even after washing. Please DO NOT use if you have an allergy to CHG or antibacterial soaps.  If your skin becomes reddened/irritated stop using the CHG and inform your nurse when you arrive at Short Stay. Do not shave (including legs and underarms) for at least 48 hours prior to  the first CHG shower.  You  may shave your face. Please follow these instructions carefully:   1.  Shower with CHG Soap the night before surgery and the  morning of Surgery.   2.  If you choose to wash your hair, wash your hair first as usual with your  normal  Shampoo.   3.  After you shampoo, rinse your hair and body thoroughly to remove the  shampoo.                                         4.  Use CHG as you would any other liquid soap.  You can apply chg directly  to the skin and wash . Gently wash with scrungie or clean wascloth    5.  Apply the CHG Soap to your body ONLY FROM THE NECK DOWN.   Do not use on open                           Wound or open sores. Avoid contact with eyes, ears mouth and genitals (private parts).                        Genitals (private parts) with your normal soap.              6.  Wash thoroughly, paying special attention to the area where your surgery  will be performed.   7.  Thoroughly rinse your body with warm water from the neck down.   8.  DO NOT shower/wash with your normal soap after using and rinsing off  the CHG Soap .                9.  Pat yourself dry with a clean towel.             10.  Wear clean pajamas.             11.  Place clean sheets on your bed the night of your first shower and do not  sleep with pets.  Day of Surgery : Do not apply any lotions/deodorants the morning of surgery.  Please wear clean clothes to the hospital/surgery center.  FAILURE TO FOLLOW THESE INSTRUCTIONS MAY RESULT IN THE CANCELLATION OF YOUR SURGERY    PATIENT SIGNATURE_________________________________  ______________________________________________________________________

## 2013-09-01 ENCOUNTER — Ambulatory Visit (HOSPITAL_COMMUNITY)
Admission: RE | Admit: 2013-09-01 | Discharge: 2013-09-01 | Disposition: A | Discharge status: Home / Self Care | Payer: Managed Care, Other (non HMO) | Source: Ambulatory Visit | Attending: Anesthesiology | Admitting: Anesthesiology

## 2013-09-01 ENCOUNTER — Encounter (HOSPITAL_COMMUNITY): Payer: Self-pay

## 2013-09-01 ENCOUNTER — Encounter (HOSPITAL_COMMUNITY)
Admission: RE | Admit: 2013-09-01 | Discharge: 2013-09-01 | Disposition: A | Discharge status: Home / Self Care | Payer: Managed Care, Other (non HMO) | Source: Ambulatory Visit | Attending: Surgery | Admitting: Surgery

## 2013-09-01 DIAGNOSIS — I10 Essential (primary) hypertension: Secondary | ICD-10-CM | POA: Insufficient documentation

## 2013-09-01 DIAGNOSIS — Z01812 Encounter for preprocedural laboratory examination: Secondary | ICD-10-CM | POA: Insufficient documentation

## 2013-09-01 DIAGNOSIS — Z01818 Encounter for other preprocedural examination: Secondary | ICD-10-CM | POA: Insufficient documentation

## 2013-09-01 HISTORY — DX: Nocturia: R35.1

## 2013-09-01 HISTORY — DX: Family history of other specified conditions: Z84.89

## 2013-09-01 HISTORY — DX: Personal history of other diseases of the digestive system: Z87.19

## 2013-09-01 LAB — COMPREHENSIVE METABOLIC PANEL
ALT: 24 U/L (ref 0–35)
AST: 27 U/L (ref 0–37)
Albumin: 4.1 g/dL (ref 3.5–5.2)
Alkaline Phosphatase: 60 U/L (ref 39–117)
Anion gap: 12 (ref 5–15)
BUN: 18 mg/dL (ref 6–23)
CO2: 28 mEq/L (ref 19–32)
Calcium: 9.6 mg/dL (ref 8.4–10.5)
Chloride: 101 mEq/L (ref 96–112)
Creatinine, Ser: 0.57 mg/dL (ref 0.50–1.10)
GFR calc Af Amer: >90 mL/min (ref >90)
GFR calc non Af Amer: >90 mL/min (ref >90)
Glucose, Bld: 91 mg/dL (ref 70–99)
Potassium: 4.5 mEq/L (ref 3.7–5.3)
Sodium: 141 mEq/L (ref 137–147)
Total Bilirubin: 0.2 mg/dL — ABNORMAL LOW (ref 0.3–1.2)
Total Protein: 7.5 g/dL (ref 6.0–8.3)

## 2013-09-01 LAB — CBC WITH DIFFERENTIAL/PLATELET
Basophils Absolute: 0 10*3/uL (ref 0.0–0.1)
Basophils Relative: 1 % (ref 0–1)
Eosinophils Absolute: 0.1 10*3/uL (ref 0.0–0.7)
Eosinophils Relative: 1 % (ref 0–5)
HCT: 37.5 % (ref 36.0–46.0)
Hemoglobin: 12.9 g/dL (ref 12.0–15.0)
Lymphocytes Relative: 37 % (ref 12–46)
Lymphs Abs: 2.2 10*3/uL (ref 0.7–4.0)
MCH: 30.3 pg (ref 26.0–34.0)
MCHC: 34.4 g/dL (ref 30.0–36.0)
MCV: 88 fL (ref 78.0–100.0)
Monocytes Absolute: 0.6 10*3/uL (ref 0.1–1.0)
Monocytes Relative: 10 % (ref 3–12)
Neutro Abs: 3 10*3/uL (ref 1.7–7.7)
Neutrophils Relative %: 51 % (ref 43–77)
Platelets: 333 10*3/uL (ref 150–400)
RBC: 4.26 MIL/uL (ref 3.87–5.11)
RDW: 12.5 % (ref 11.5–15.5)
WBC: 5.9 10*3/uL (ref 4.0–10.5)

## 2013-09-04 ENCOUNTER — Ambulatory Visit (INDEPENDENT_AMBULATORY_CARE_PROVIDER_SITE_OTHER): Payer: Managed Care, Other (non HMO) | Admitting: Surgery

## 2013-09-05 ENCOUNTER — Ambulatory Visit (INDEPENDENT_AMBULATORY_CARE_PROVIDER_SITE_OTHER): Payer: Managed Care, Other (non HMO) | Admitting: Surgery

## 2013-09-05 ENCOUNTER — Ambulatory Visit (INDEPENDENT_AMBULATORY_CARE_PROVIDER_SITE_OTHER): Payer: Managed Care, Other (non HMO) | Admitting: Internal Medicine

## 2013-09-05 ENCOUNTER — Encounter: Payer: Self-pay | Admitting: Internal Medicine

## 2013-09-05 VITALS — BP 120/72 | HR 64 | Temp 97.4°F | Wt 206.0 lb

## 2013-09-05 DIAGNOSIS — F3289 Other specified depressive episodes: Secondary | ICD-10-CM

## 2013-09-05 DIAGNOSIS — Z01818 Encounter for other preprocedural examination: Secondary | ICD-10-CM | POA: Insufficient documentation

## 2013-09-05 DIAGNOSIS — F329 Major depressive disorder, single episode, unspecified: Secondary | ICD-10-CM

## 2013-09-05 DIAGNOSIS — E039 Hypothyroidism, unspecified: Secondary | ICD-10-CM

## 2013-09-05 DIAGNOSIS — F411 Generalized anxiety disorder: Secondary | ICD-10-CM

## 2013-09-05 NOTE — Progress Notes (Signed)
Pre visit review using our clinic review tool, if applicable. No additional management support is needed unless otherwise documented below in the visit note. 

## 2013-09-05 NOTE — Progress Notes (Signed)
Subjective:    Patient ID: Kelli Bell, female    DOB: 29-Mar-1960, 53 y.o.   MRN: [id number]  HPI here to f/u - Here to f/u; overall doing ok,  Pt denies chest pain, increased sob or doe, wheezing, orthopnea, PND, increased LE swelling, palpitations, dizziness or syncope.  Pt denies polydipsia, polyuria, or low sugar symptoms such as weakness or confusion improved with po intake.  Pt denies new neurological symptoms such as new headache, or facial or extremity weakness or numbness.   Pt states overall good compliance with meds, has been trying to follow lower cholesterol diet, with wt overall not comign down.  Due for Roux-N-Y for Aug 18 , with Dr Newman/gen surg, did complete pre eval and ins will cover.  Having some increased stress, husband ill with esoph ca, due to start stereotactic xrt next Mon aug 10 for recurrent dz, but pt mother will stay with both to help with care and driving.  Denies worsening depressive symptoms, suicidal ideation, or panic.   Denies hyper or hypo thyroid symptoms such as voice, skin or hair change.  Next appt here sept 1 Past Medical History  Diagnosis Date  . ANXIETY 03/30/2007  . BACK PAIN 02/20/2009  . DEPRESSION 03/30/2007  . GERD 01/13/2007  . HYPERLIPIDEMIA 02/22/2008  . MENOPAUSAL DISORDER 02/23/2008  . SINUSITIS- ACUTE-NOS 03/30/2007  . THORACIC/LUMBOSACRAL NEURITIS/RADICULITIS UNSPEC 02/23/2008  . Wheezing 01/08/2009  . HYPOTHYROIDISM 03/30/2007  . Essential hypertension, benign 05/31/2012  . Family history of anesthesia complication     "mother has hard time waking up"  . OBSTRUCTIVE SLEEP APNEA 06/29/2007    has not used c pap x 8 months due to wt loss  . H/O hiatal hernia   . Nocturia    Past Surgical History  Procedure Laterality Date  . Cholecystectomy  1980  . Jaw bone surgury      53 yo  . Tonsillectomy    . Breath tek h pylori N/A 05/05/2013    Procedure: BREATH TEK H PYLORI;  Surgeon: Shann Medal, MD;  Location: [facility];  Service: General;   Laterality: N/A;  . Abdominal hysterectomy  2004    with BSO    reports that she has never smoked. She has never used smokeless tobacco. She reports that she does not drink alcohol or use illicit drugs. family history includes Arthritis in her other; Asthma in her brother; Breast cancer in her maternal aunt; Cancer in her mother; Colon polyps (age of onset: 17) in her father; Diabetes in her mother and paternal grandmother; Heart disease in her paternal grandmother; Hypertension in her mother; Lymphoma in her maternal grandmother; Osteoporosis in her other; Stroke in her mother; Thyroid disease in her maternal grandmother and mother. There is no history of Colon cancer. Allergies  Allergen Reactions  . Codeine Nausea Only   Current Outpatient Prescriptions on File Prior to Visit  Medication Sig Dispense Refill  . acetaminophen (TYLENOL) 500 MG tablet Take 1,000 mg by mouth every 6 (six) hours as needed (Pain).      Marland Kitchen ALPRAZolam (XANAX) 0.25 MG tablet Take 0.25 mg by mouth 3 (three) times daily as needed for anxiety.      Marland Kitchen levothyroxine (SYNTHROID, LEVOTHROID) 88 MCG tablet Take 88 mcg by mouth every morning.       Marland Kitchen losartan (COZAAR) 50 MG tablet Take 50 mg by mouth at bedtime.      Marland Kitchen omeprazole (PRILOSEC) 20 MG capsule Take 40 mg by mouth at  bedtime.       . traMADol (ULTRAM) 50 MG tablet Take 50 mg by mouth every 6 (six) hours as needed (Pain).      . fluticasone (FLONASE) 50 MCG/ACT nasal spray Place 1 spray into both nostrils 2 (two) times daily as needed for allergies or rhinitis.      Marland Kitchen nystatin-triamcinolone ointment (MYCOLOG) Apply 1 application topically 2 (two) times daily as needed (Rash underneath flap of stomach).       No current facility-administered medications on file prior to visit.    Review of Systems  Constitutional: Negative for unusual diaphoresis or other sweats  HENT: Negative for ringing in ear Eyes: Negative for double vision or worsening visual disturbance.    Respiratory: Negative for choking and stridor.   Gastrointestinal: Negative for vomiting or other signifcant bowel change Genitourinary: Negative for hematuria or decreased urine volume.  Musculoskeletal: Negative for other MSK pain or swelling Skin: Negative for color change and worsening wound.  Neurological: Negative for tremors and numbness other than noted  Psychiatric/Behavioral: Negative for decreased concentration or agitation other than above       Objective:   Physical Exam BP 120/72  Pulse 64  Temp(Src) 97.4 F (36.3 C) (Oral)  Wt 206 lb (93.441 kg)  SpO2 99% VS noted,  Constitutional: Pt appears well-developed, well-nourished.  HENT: Head: NCAT.  Right Ear: External ear normal.  Left Ear: External ear normal.  Eyes: . Pupils are equal, round, and reactive to light. Conjunctivae and EOM are normal Neck: Normal range of motion. Neck supple.  Cardiovascular: Normal rate and regular rhythm.   Pulmonary/Chest: Effort normal and breath sounds normal.  Abd:  Soft, NT, ND, + BS Neurological: Pt is alert. Not confused , motor grossly intact Skin: Skin is warm. No rash Psychiatric: Pt behavior is normal. No agitation.     Assessment & Plan:

## 2013-09-05 NOTE — Assessment & Plan Note (Signed)
stable overall by history and exam, and pt to continue medical treatment as before,  to f/u any worsening symptoms or concerns 

## 2013-09-05 NOTE — Assessment & Plan Note (Signed)
Ok for surgury as planned 

## 2013-09-05 NOTE — Assessment & Plan Note (Signed)
stable overall by history and exam, recent data reviewed with pt, and pt to continue medical treatment as before,  to f/u any worsening symptoms or concerns Lab Results  Component Value Date   WBC 5.9 09/01/2013   HGB 12.9 09/01/2013   HCT 37.5 09/01/2013   PLT 333 09/01/2013   GLUCOSE 91 09/01/2013   CHOL 224* 04/18/2013   TRIG 182.0* 04/18/2013   HDL 65.20 04/18/2013   LDLDIRECT 94.8 04/29/2012   LDLCALC 122* 04/18/2013   ALT 24 09/01/2013   AST 27 09/01/2013   NA 141 09/01/2013   K 4.5 09/01/2013   CL 101 09/01/2013   CREATININE 0.57 09/01/2013   BUN 18 09/01/2013   CO2 28 09/01/2013   TSH 3.935 08/08/2013

## 2013-09-05 NOTE — Assessment & Plan Note (Signed)
stable overall by history and exam, recent data reviewed with pt, and pt to continue medical treatment as before,  to f/u any worsening symptoms or concerns Lab Results  Component Value Date   TSH 3.935 08/08/2013

## 2013-09-05 NOTE — Patient Instructions (Signed)
Please continue all other medications as before, and refills have been done if requested.  Please have the pharmacy call with any other refills you may need.  Please continue your efforts at being more active, low cholesterol diet, and weight control.  Please keep your appointments with your specialists as you may have planned  No further lab work needed today

## 2013-09-11 ENCOUNTER — Encounter (INDEPENDENT_AMBULATORY_CARE_PROVIDER_SITE_OTHER): Payer: Self-pay | Admitting: Surgery

## 2013-09-11 ENCOUNTER — Ambulatory Visit (INDEPENDENT_AMBULATORY_CARE_PROVIDER_SITE_OTHER): Payer: Managed Care, Other (non HMO) | Admitting: Surgery

## 2013-09-11 ENCOUNTER — Other Ambulatory Visit (INDEPENDENT_AMBULATORY_CARE_PROVIDER_SITE_OTHER): Payer: Self-pay | Admitting: *Deleted

## 2013-09-11 NOTE — Progress Notes (Addendum)
Re:   Kelli Bell DOB:   [birth year] MRN:   [id number]  ASSESSMENT AND PLAN: 1.  Morbid obesity   Initial weight - 206, BMI - 36.6  Per the [place], the patient is a candidate for bariatric surgery.  The patient attended our initial information session and reviewed the types of bariatric surgery.    The patient is interested in the Roux en Y Gastric Bypass.  I discussed with the patient the indications and risks of bariatric surgery.  The potential risks of surgery include, but are not limited to, bleeding, infection, leak from the bowel, DVT and PE, open surgery, long term nutrition consequences, and death.  The patient understands the importance of compliance and long term follow-up with our group after surgery.  She saw Dr. Marshall Cork just last week.  I gave her a prescription for golytely and Roxicet.  2.  Back pain  Chronic - gets injections - sees Dr. Koleen Distance 3.  History of anxiety/depression 4.  OSA  Since 2009 - on CPAP 5.  HTN x 2 years 6.  Rare migraine 7.  Hypothyroid - on replacement  Chief Complaint  Patient presents with  . Bariatric Follow Up   REFERRING PHYSICIAN: Cathlean Cower, MD  HISTORY OF PRESENT ILLNESS: Kelli Bell is a 53 y.o. (DOB: [birth year])  white  female whose primary care physician is Cathlean Cower, MD and comes to me today for weight loss surgery. She comes with her mother, Dianna Limbo.  Her husband has had recurrence of his esophageal cancer.    We talked about delaying her surgery, but she was adamant about going in with surgery. She understands the significant risk of this operation and prolonged hospitalization or recovery could impede being with her husband. I again reviewed with her and her mother the operation, hospitalization, and the postop course and management. She said she had been to the support group, she had read a lot and she was ready to go ahead with surgery.  We talked about potential adhesions and how that effects  the operation.  UGI - 05/17/2013 - small HH Psych - 05/15/2013 - Dr. Ardath Sax  History of weight issues: The patient used our Neurosurgeon.  She has several friends who have had gastric bypass and has done well.  She has tried multiple diets:  Weight Watchers x 3, Physician Wt Loss x 2, low cholesterol diet, low cal, and Slim Fast.  She has had some success with these diets, none lasted.  One thing that she emphasized is that she is always tired.  She has really struggled since her husband was diagnosed with esophageal cancer in 2013.  But he husband is cancer free at this time.   Past Medical History  Diagnosis Date  . ANXIETY 03/30/2007  . BACK PAIN 02/20/2009  . DEPRESSION 03/30/2007  . GERD 01/13/2007  . HYPERLIPIDEMIA 02/22/2008  . MENOPAUSAL DISORDER 02/23/2008  . SINUSITIS- ACUTE-NOS 03/30/2007  . THORACIC/LUMBOSACRAL NEURITIS/RADICULITIS UNSPEC 02/23/2008  . Wheezing 01/08/2009  . HYPOTHYROIDISM 03/30/2007  . Essential hypertension, benign 05/31/2012  . Family history of anesthesia complication     "mother has hard time waking up"  . OBSTRUCTIVE SLEEP APNEA 06/29/2007    has not used c pap x 8 months due to wt loss  . H/O hiatal hernia   . Nocturia       Past Surgical History  Procedure Laterality Date  . Cholecystectomy  1980  . Jaw bone surgury  53 yo  . Tonsillectomy    . Breath tek h pylori N/A 05/05/2013    Procedure: BREATH TEK H PYLORI;  Surgeon: Shann Medal, MD;  Location: [facility];  Service: General;  Laterality: N/A;  . Abdominal hysterectomy  2004    with BSO      Current Outpatient Prescriptions  Medication Sig Dispense Refill  . acetaminophen (TYLENOL) 500 MG tablet Take 1,000 mg by mouth every 6 (six) hours as needed (Pain).      Marland Kitchen ALPRAZolam (XANAX) 0.25 MG tablet Take 0.25 mg by mouth 3 (three) times daily as needed for anxiety.      . fluticasone (FLONASE) 50 MCG/ACT nasal spray Place 1 spray into both nostrils 2 (two) times daily as needed for  allergies or rhinitis.      Marland Kitchen levothyroxine (SYNTHROID, LEVOTHROID) 88 MCG tablet Take 88 mcg by mouth every morning.       Marland Kitchen losartan (COZAAR) 50 MG tablet Take 50 mg by mouth at bedtime.      Marland Kitchen nystatin-triamcinolone ointment (MYCOLOG) Apply 1 application topically 2 (two) times daily as needed (Rash underneath flap of stomach).      Marland Kitchen omeprazole (PRILOSEC) 20 MG capsule Take 40 mg by mouth at bedtime.       . traMADol (ULTRAM) 50 MG tablet Take 50 mg by mouth every 6 (six) hours as needed (Pain).       No current facility-administered medications for this visit.      Allergies  Allergen Reactions  . Codeine Nausea Only    REVIEW OF SYSTEMS: Skin:  No history of rash.  No history of abnormal moles. Infection:  No history of hepatitis or HIV.  No history of MRSA. Neurologic:  Occasional migraine HA. Cardiac:  HTN x 2 years.  No history of seeing a cardiologist.  She said that she has been told an EKG suggested an old MI, but she has had no further eval for this. Pulmonary:   OSA/CPAP since 2009.  Endocrine:  No diabetes. Hypothyroid, on replacement. Gastrointestinal:  No history of stomach disease.  No history of liver disease. Open chole - 1980.  No history of pancreas disease.  She had a upper endo and colonoscopy by Dr. Olevia Perches in 2014. Urologic:  No history of kidney stones.  No history of bladder infections. GYN - Hysterectomy - 2004.  Sees N. Young, NP. Musculoskeletal:  Chronic pain issues with back. She is on chronic tramadol.  She sees Dr. Koleen Distance. Hematologic:  No bleeding disorder.  No history of anemia.  Not anticoagulated. Psycho-social:  The patient is oriented.   She has chronic anxiety and depression, but does not see a therapist.  Dr. Jenny Reichmann manages this.  SOCIAL and FAMILY HISTORY: Married. Husband, Liliane Channel, had esophageal cancer diagnosed in 2013 ([place]/ [place]).  His disease has recurred and he is getting Rad tx.  He works as Hydrologist at [employer]. She does not work. She has two children:  66 yo daughter and 46 yo son.  PHYSICAL EXAM: BP 122/76  Pulse 60  Ht 5\' 2"  (1.575 m)  Wt 202 lb (91.627 kg)  BMI 36.94 kg/m2  General: WN WF who is alert and generally healthy appearing.  HEENT: Normal. Pupils equal. Neck: Supple. No mass.  No thyroid mass. Lymph Nodes:  No supraclavicular or cervical nodes. Lungs: Clear to auscultation and symmetric breath sounds. Heart:  RRR. No murmur or rub. Breasts:  Right - no mass  Left - no  mass  Abdomen: Soft. No mass. No tenderness. No hernia. Normal bowel sounds.  RUQ transverse scar and pfannenstiel scar.  She is about 1/2 apple and 1/2 pear. Extremities:  Good strength and ROM  in upper and lower extremities. Neurologic:  Grossly intact to motor and sensory function. Psychiatric: Has normal mood and affect. Behavior is normal.   DATA REVIEWED: Epic and notes.  Alphonsa Overall, MD,  [facility], [address]  [place], [place]    [place] Phone:  [phone] FAX:  [phone]

## 2013-09-12 ENCOUNTER — Encounter (HOSPITAL_COMMUNITY): Payer: Managed Care, Other (non HMO) | Admitting: Anesthesiology

## 2013-09-12 ENCOUNTER — Encounter (HOSPITAL_COMMUNITY): Payer: Self-pay | Admitting: *Deleted

## 2013-09-12 ENCOUNTER — Inpatient Hospital Stay (HOSPITAL_COMMUNITY): Payer: Managed Care, Other (non HMO) | Admitting: Anesthesiology

## 2013-09-12 ENCOUNTER — Encounter (HOSPITAL_COMMUNITY)
Admission: RE | Disposition: A | Discharge status: Home / Self Care | Payer: Self-pay | Source: Ambulatory Visit | Attending: Surgery

## 2013-09-12 ENCOUNTER — Inpatient Hospital Stay (HOSPITAL_COMMUNITY)
Admission: RE | Admit: 2013-09-12 | Discharge: 2013-09-14 | DRG: 621 | Disposition: A | Discharge status: Home / Self Care | Payer: Managed Care, Other (non HMO) | Source: Ambulatory Visit | Attending: Surgery | Admitting: Surgery

## 2013-09-12 DIAGNOSIS — G4733 Obstructive sleep apnea (adult) (pediatric): Secondary | ICD-10-CM | POA: Diagnosis present

## 2013-09-12 DIAGNOSIS — E785 Hyperlipidemia, unspecified: Secondary | ICD-10-CM | POA: Diagnosis present

## 2013-09-12 DIAGNOSIS — I10 Essential (primary) hypertension: Secondary | ICD-10-CM

## 2013-09-12 DIAGNOSIS — F3289 Other specified depressive episodes: Secondary | ICD-10-CM | POA: Diagnosis present

## 2013-09-12 DIAGNOSIS — F329 Major depressive disorder, single episode, unspecified: Secondary | ICD-10-CM | POA: Diagnosis present

## 2013-09-12 DIAGNOSIS — Z6836 Body mass index (BMI) 36.0-36.9, adult: Secondary | ICD-10-CM

## 2013-09-12 DIAGNOSIS — F411 Generalized anxiety disorder: Secondary | ICD-10-CM | POA: Diagnosis present

## 2013-09-12 DIAGNOSIS — Z9089 Acquired absence of other organs: Secondary | ICD-10-CM

## 2013-09-12 DIAGNOSIS — K660 Peritoneal adhesions (postprocedural) (postinfection): Secondary | ICD-10-CM | POA: Diagnosis present

## 2013-09-12 DIAGNOSIS — K219 Gastro-esophageal reflux disease without esophagitis: Secondary | ICD-10-CM | POA: Diagnosis present

## 2013-09-12 DIAGNOSIS — E039 Hypothyroidism, unspecified: Secondary | ICD-10-CM | POA: Diagnosis present

## 2013-09-12 HISTORY — PX: GASTRIC ROUX-EN-Y: SHX5262

## 2013-09-12 LAB — HEMOGLOBIN AND HEMATOCRIT, BLOOD
HCT: 34.5 % — ABNORMAL LOW (ref 36.0–46.0)
Hemoglobin: 12 g/dL (ref 12.0–15.0)

## 2013-09-12 SURGERY — LAPAROSCOPIC ROUX-EN-Y GASTRIC BYPASS WITH UPPER ENDOSCOPY
Anesthesia: General | Site: Abdomen

## 2013-09-12 MED ORDER — FENTANYL CITRATE 0.05 MG/ML IJ SOLN
INTRAMUSCULAR | Status: DC | PRN
  Administered 2013-09-12: 50 ug via INTRAVENOUS
  Administered 2013-09-12: 100 ug via INTRAVENOUS
  Administered 2013-09-12: 50 ug via INTRAVENOUS
  Administered 2013-09-12 (×2): 100 ug via INTRAVENOUS
  Administered 2013-09-12: 50 ug via INTRAVENOUS

## 2013-09-12 MED ORDER — BUPIVACAINE HCL (PF) 0.25 % IJ SOLN
INTRAMUSCULAR | Status: DC | PRN
  Administered 2013-09-12: 30 mL

## 2013-09-12 MED ORDER — HYDROMORPHONE HCL PF 1 MG/ML IJ SOLN
INTRAMUSCULAR | Status: AC
  Filled 2013-09-12: qty 1

## 2013-09-12 MED ORDER — ONDANSETRON HCL 4 MG/2ML IJ SOLN
INTRAMUSCULAR | Status: AC
  Filled 2013-09-12: qty 2

## 2013-09-12 MED ORDER — ACETAMINOPHEN 10 MG/ML IV SOLN
1000.0000 mg | Freq: Once | INTRAVENOUS | Status: AC
  Administered 2013-09-12: 1000 mg via INTRAVENOUS
  Filled 2013-09-12: qty 100

## 2013-09-12 MED ORDER — UNJURY CHOCOLATE CLASSIC POWDER: 2.0000 [oz_av] | Freq: Four times a day (QID) | ORAL | Status: DC

## 2013-09-12 MED ORDER — UNJURY VANILLA POWDER
2.0000 [oz_av] | Freq: Four times a day (QID) | ORAL | Status: DC
  Administered 2013-09-14: 2 [oz_av] via ORAL

## 2013-09-12 MED ORDER — MORPHINE SULFATE 2 MG/ML IJ SOLN
2.0000 mg | INTRAMUSCULAR | Status: DC | PRN
  Administered 2013-09-12 (×4): 4 mg via INTRAVENOUS
  Administered 2013-09-12 (×2): 2 mg via INTRAVENOUS
  Administered 2013-09-13 (×4): 4 mg via INTRAVENOUS
  Administered 2013-09-13: 2 mg via INTRAVENOUS
  Administered 2013-09-13: 4 mg via INTRAVENOUS
  Filled 2013-09-12 (×2): qty 1
  Filled 2013-09-12 (×5): qty 2
  Filled 2013-09-12 (×2): qty 1
  Filled 2013-09-12 (×4): qty 2

## 2013-09-12 MED ORDER — LIDOCAINE HCL (PF) 2 % IJ SOLN
INTRAMUSCULAR | Status: DC | PRN
  Administered 2013-09-12: 75 mg via INTRADERMAL

## 2013-09-12 MED ORDER — ATROPINE SULFATE 0.4 MG/ML IJ SOLN
INTRAMUSCULAR | Status: AC
  Filled 2013-09-12: qty 1

## 2013-09-12 MED ORDER — MIDAZOLAM HCL 2 MG/2ML IJ SOLN
INTRAMUSCULAR | Status: AC
  Filled 2013-09-12: qty 2

## 2013-09-12 MED ORDER — ROCURONIUM BROMIDE 100 MG/10ML IV SOLN
INTRAVENOUS | Status: AC
  Filled 2013-09-12: qty 1

## 2013-09-12 MED ORDER — CHLORHEXIDINE GLUCONATE 4 % EX LIQD
60.0000 mL | Freq: Once | CUTANEOUS | Status: DC
Start: 2013-09-13 — End: 2013-09-12

## 2013-09-12 MED ORDER — PROMETHAZINE HCL 25 MG/ML IJ SOLN: 6.2500 mg | INTRAMUSCULAR | Status: DC | PRN

## 2013-09-12 MED ORDER — LIDOCAINE HCL (CARDIAC) 20 MG/ML IV SOLN
INTRAVENOUS | Status: AC
  Filled 2013-09-12: qty 5

## 2013-09-12 MED ORDER — EVICEL 5 ML EX KIT: PACK | CUTANEOUS | Status: DC | PRN

## 2013-09-12 MED ORDER — CHLORHEXIDINE GLUCONATE 0.12 % MT SOLN
15.0000 mL | Freq: Two times a day (BID) | OROMUCOSAL | Status: DC
  Administered 2013-09-12 – 2013-09-14 (×3): 15 mL via OROMUCOSAL
  Filled 2013-09-12 (×6): qty 15

## 2013-09-12 MED ORDER — FENTANYL CITRATE 0.05 MG/ML IJ SOLN
INTRAMUSCULAR | Status: AC
  Filled 2013-09-12: qty 2

## 2013-09-12 MED ORDER — PROPOFOL 10 MG/ML IV BOLUS
INTRAVENOUS | Status: DC | PRN
  Administered 2013-09-12: 200 mg via INTRAVENOUS

## 2013-09-12 MED ORDER — LACTATED RINGERS IV SOLN
INTRAVENOUS | Status: DC | PRN
  Administered 2013-09-12 (×4): via INTRAVENOUS

## 2013-09-12 MED ORDER — EPHEDRINE SULFATE 50 MG/ML IJ SOLN
INTRAMUSCULAR | Status: AC
  Filled 2013-09-12: qty 1

## 2013-09-12 MED ORDER — HYDROMORPHONE HCL PF 1 MG/ML IJ SOLN
0.2500 mg | INTRAMUSCULAR | Status: DC | PRN
  Administered 2013-09-12: 0.5 mg via INTRAVENOUS
  Administered 2013-09-12: 0.25 mg via INTRAVENOUS

## 2013-09-12 MED ORDER — UNJURY CHICKEN SOUP POWDER: 2.0000 [oz_av] | Freq: Four times a day (QID) | ORAL | Status: DC

## 2013-09-12 MED ORDER — LACTATED RINGERS IV SOLN: INTRAVENOUS | Status: DC

## 2013-09-12 MED ORDER — BUPIVACAINE HCL (PF) 0.25 % IJ SOLN
INTRAMUSCULAR | Status: AC
  Filled 2013-09-12: qty 30

## 2013-09-12 MED ORDER — SODIUM CHLORIDE 0.9 % IJ SOLN
INTRAMUSCULAR | Status: AC
  Filled 2013-09-12: qty 10

## 2013-09-12 MED ORDER — ACETAMINOPHEN 160 MG/5ML PO SOLN
650.0000 mg | ORAL | Status: DC | PRN
  Administered 2013-09-14: 650 mg via ORAL
  Filled 2013-09-12 (×3): qty 20.3

## 2013-09-12 MED ORDER — TISSEEL VH 10 ML EX KIT
PACK | CUTANEOUS | Status: AC
  Filled 2013-09-12: qty 2

## 2013-09-12 MED ORDER — ACETAMINOPHEN 160 MG/5ML PO SOLN
325.0000 mg | ORAL | Status: DC | PRN
  Administered 2013-09-13 (×2): 650 mg via ORAL
  Administered 2013-09-13: 325 mg via ORAL
  Administered 2013-09-14: 650 mg via ORAL
  Filled 2013-09-12 (×4): qty 20.3

## 2013-09-12 MED ORDER — PROPOFOL 10 MG/ML IV BOLUS
INTRAVENOUS | Status: AC
  Filled 2013-09-12: qty 20

## 2013-09-12 MED ORDER — DEXAMETHASONE SODIUM PHOSPHATE 10 MG/ML IJ SOLN
INTRAMUSCULAR | Status: DC | PRN
  Administered 2013-09-12: 10 mg via INTRAVENOUS

## 2013-09-12 MED ORDER — HYDROMORPHONE HCL PF 1 MG/ML IJ SOLN
INTRAMUSCULAR | Status: DC | PRN
  Administered 2013-09-12 (×2): 1 mg via INTRAVENOUS

## 2013-09-12 MED ORDER — HEPARIN SODIUM (PORCINE) 5000 UNIT/ML IJ SOLN
5000.0000 [IU] | INTRAMUSCULAR | Status: AC
  Administered 2013-09-12: 5000 [IU] via SUBCUTANEOUS
  Filled 2013-09-12: qty 1

## 2013-09-12 MED ORDER — TISSEEL VH 10 ML EX KIT
PACK | CUTANEOUS | Status: DC | PRN
  Administered 2013-09-12: 2

## 2013-09-12 MED ORDER — LACTATED RINGERS IR SOLN
Status: DC | PRN
  Administered 2013-09-12: 1000 mL

## 2013-09-12 MED ORDER — 0.9 % SODIUM CHLORIDE (POUR BTL) OPTIME
TOPICAL | Status: DC | PRN
  Administered 2013-09-12: 1000 mL

## 2013-09-12 MED ORDER — HYDROMORPHONE HCL PF 2 MG/ML IJ SOLN
INTRAMUSCULAR | Status: AC
  Filled 2013-09-12: qty 1

## 2013-09-12 MED ORDER — CETYLPYRIDINIUM CHLORIDE 0.05 % MT LIQD
7.0000 mL | Freq: Two times a day (BID) | OROMUCOSAL | Status: DC
  Administered 2013-09-13 (×2): 7 mL via OROMUCOSAL

## 2013-09-12 MED ORDER — ROCURONIUM BROMIDE 100 MG/10ML IV SOLN
INTRAVENOUS | Status: DC | PRN
  Administered 2013-09-12 (×2): 20 mg via INTRAVENOUS
  Administered 2013-09-12 (×2): 10 mg via INTRAVENOUS
  Administered 2013-09-12: 50 mg via INTRAVENOUS

## 2013-09-12 MED ORDER — LACTATED RINGERS IV SOLN
INTRAVENOUS | Status: DC | PRN
  Administered 2013-09-12: 07:00:00 via INTRAVENOUS

## 2013-09-12 MED ORDER — METOCLOPRAMIDE HCL 5 MG/ML IJ SOLN
INTRAMUSCULAR | Status: DC | PRN
  Administered 2013-09-12: 10 mg via INTRAVENOUS

## 2013-09-12 MED ORDER — DEXTROSE 5 % IV SOLN
INTRAVENOUS | Status: AC
  Filled 2013-09-12 (×2): qty 2

## 2013-09-12 MED ORDER — NEOSTIGMINE METHYLSULFATE 10 MG/10ML IV SOLN
INTRAVENOUS | Status: DC | PRN
  Administered 2013-09-12: 4 mg via INTRAVENOUS

## 2013-09-12 MED ORDER — FENTANYL CITRATE 0.05 MG/ML IJ SOLN
INTRAMUSCULAR | Status: AC
  Filled 2013-09-12: qty 5

## 2013-09-12 MED ORDER — SUCCINYLCHOLINE CHLORIDE 20 MG/ML IJ SOLN
INTRAMUSCULAR | Status: DC | PRN
  Administered 2013-09-12: 100 mg via INTRAVENOUS

## 2013-09-12 MED ORDER — DEXAMETHASONE SODIUM PHOSPHATE 10 MG/ML IJ SOLN
INTRAMUSCULAR | Status: AC
  Filled 2013-09-12: qty 1

## 2013-09-12 MED ORDER — CEFOXITIN SODIUM 2 G IV SOLR
2.0000 g | Freq: Once | INTRAVENOUS | Status: AC
  Administered 2013-09-12: 2 g via INTRAVENOUS
  Filled 2013-09-12: qty 2

## 2013-09-12 MED ORDER — POTASSIUM CHLORIDE IN NACL 20-0.45 MEQ/L-% IV SOLN
INTRAVENOUS | Status: DC
  Administered 2013-09-12: 150 mL via INTRAVENOUS
  Administered 2013-09-12: 14:00:00 via INTRAVENOUS
  Administered 2013-09-13: 150 mL via INTRAVENOUS
  Administered 2013-09-13: 17:00:00 via INTRAVENOUS
  Administered 2013-09-13: 150 mL via INTRAVENOUS
  Filled 2013-09-12 (×9): qty 1000

## 2013-09-12 MED ORDER — CHLORHEXIDINE GLUCONATE 4 % EX LIQD: 60.0000 mL | Freq: Once | CUTANEOUS | Status: DC

## 2013-09-12 MED ORDER — MIDAZOLAM HCL 5 MG/5ML IJ SOLN
INTRAMUSCULAR | Status: DC | PRN
  Administered 2013-09-12: 2 mg via INTRAVENOUS

## 2013-09-12 MED ORDER — ONDANSETRON HCL 4 MG/2ML IJ SOLN
4.0000 mg | INTRAMUSCULAR | Status: DC | PRN
  Administered 2013-09-13: 4 mg via INTRAVENOUS
  Filled 2013-09-12: qty 2

## 2013-09-12 MED ORDER — HEPARIN SODIUM (PORCINE) 5000 UNIT/ML IJ SOLN
5000.0000 [IU] | Freq: Three times a day (TID) | INTRAMUSCULAR | Status: DC
  Administered 2013-09-12 – 2013-09-14 (×6): 5000 [IU] via SUBCUTANEOUS
  Filled 2013-09-12 (×9): qty 1

## 2013-09-12 MED ORDER — KETOROLAC TROMETHAMINE 30 MG/ML IJ SOLN: 15.0000 mg | Freq: Once | INTRAMUSCULAR | Status: DC | PRN

## 2013-09-12 MED ORDER — ONDANSETRON HCL 4 MG/2ML IJ SOLN
INTRAMUSCULAR | Status: DC | PRN
  Administered 2013-09-12: 4 mg via INTRAVENOUS

## 2013-09-12 MED ORDER — OXYCODONE HCL 5 MG/5ML PO SOLN
5.0000 mg | ORAL | Status: DC | PRN
  Administered 2013-09-13: 10 mg via ORAL
  Administered 2013-09-13: 5 mg via ORAL
  Administered 2013-09-13 (×2): 10 mg via ORAL
  Administered 2013-09-14 (×2): 5 mg via ORAL
  Filled 2013-09-12: qty 10
  Filled 2013-09-12: qty 25
  Filled 2013-09-12: qty 10
  Filled 2013-09-12 (×3): qty 5
  Filled 2013-09-12: qty 10

## 2013-09-12 MED ORDER — GLYCOPYRROLATE 0.2 MG/ML IJ SOLN
INTRAMUSCULAR | Status: DC | PRN
  Administered 2013-09-12: 0.6 mg via INTRAVENOUS

## 2013-09-12 MED ORDER — CEFOXITIN SODIUM 2 G IV SOLR
2.0000 g | INTRAVENOUS | Status: AC
  Administered 2013-09-12: 2 g via INTRAVENOUS

## 2013-09-12 MED ORDER — GLYCOPYRROLATE 0.2 MG/ML IJ SOLN
INTRAMUSCULAR | Status: AC
  Filled 2013-09-12: qty 3

## 2013-09-12 SURGICAL SUPPLY — 73 items
ADH SKN CLS APL DERMABOND .7 (GAUZE/BANDAGES/DRESSINGS) ×2
APL SRG 32X5 SNPLK LF DISP (MISCELLANEOUS) ×1
APPLICATOR COTTON TIP 6IN STRL (MISCELLANEOUS) ×4 IMPLANT
BAG SPEC RTRVL LRG 6X4 10 (ENDOMECHANICALS)
BLADE SURG 15 STRL LF DISP TIS (BLADE) ×1 IMPLANT
BLADE SURG 15 STRL SS (BLADE) ×2
CABLE HIGH FREQUENCY MONO STRZ (ELECTRODE) IMPLANT
CHLORAPREP W/TINT 26ML (MISCELLANEOUS) ×4 IMPLANT
CLIP SUT LAPRA TY ABSORB (SUTURE) ×4 IMPLANT
CUTTER FLEX LINEAR 45M (STAPLE) ×1 IMPLANT
DECANTER SPIKE VIAL GLASS SM (MISCELLANEOUS) ×2 IMPLANT
DERMABOND ADVANCED (GAUZE/BANDAGES/DRESSINGS) ×2
DERMABOND ADVANCED .7 DNX12 (GAUZE/BANDAGES/DRESSINGS) ×1 IMPLANT
DEVICE SUTURE ENDOST 10MM (ENDOMECHANICALS) ×3 IMPLANT
DISSECTOR BLUNT TIP ENDO 5MM (MISCELLANEOUS) IMPLANT
DRAIN PENROSE 18X1/4 LTX STRL (WOUND CARE) ×2 IMPLANT
DRAPE CAMERA CLOSED 9X96 (DRAPES) ×2 IMPLANT
DUPLOJECT EASY PREP 4ML (MISCELLANEOUS) ×2 IMPLANT
GAUZE SPONGE 4X4 12PLY STRL (GAUZE/BANDAGES/DRESSINGS) ×2 IMPLANT
GAUZE SPONGE 4X4 16PLY XRAY LF (GAUZE/BANDAGES/DRESSINGS) ×2 IMPLANT
GLOVE SURG SIGNA 7.5 PF LTX (GLOVE) ×2 IMPLANT
GOWN SPEC L4 XLG W/TWL (GOWN DISPOSABLE) ×2 IMPLANT
GOWN STRL REUS W/TWL XL LVL3 (GOWN DISPOSABLE) ×6 IMPLANT
HOVERMATT SINGLE USE (MISCELLANEOUS) ×2 IMPLANT
KIT BASIN OR (CUSTOM PROCEDURE TRAY) ×2 IMPLANT
KIT GASTRIC LAVAGE 34FR ADT (SET/KITS/TRAYS/PACK) ×2 IMPLANT
NDL SPNL 22GX3.5 QUINCKE BK (NEEDLE) ×1 IMPLANT
NEEDLE SPNL 22GX3.5 QUINCKE BK (NEEDLE) ×2 IMPLANT
PACK CARDIOVASCULAR III (CUSTOM PROCEDURE TRAY) ×2 IMPLANT
PEN SKIN MARKING BROAD (MISCELLANEOUS) ×2 IMPLANT
POUCH SPECIMEN RETRIEVAL 10MM (ENDOMECHANICALS) IMPLANT
RELOAD 45 VASCULAR/THIN (ENDOMECHANICALS) ×4 IMPLANT
RELOAD ENDO STITCH 2.0 (ENDOMECHANICALS) ×22
RELOAD STAPLE 45 2.5 WHT GRN (ENDOMECHANICALS) ×2 IMPLANT
RELOAD STAPLE 45 3.5 BLU ETS (ENDOMECHANICALS) ×2 IMPLANT
RELOAD STAPLE 60 2.6 WHT THN (STAPLE) IMPLANT
RELOAD STAPLE 60 3.6 BLU REG (STAPLE) ×1 IMPLANT
RELOAD STAPLE 60 3.8 GOLD REG (STAPLE) IMPLANT
RELOAD STAPLE TA45 3.5 REG BLU (ENDOMECHANICALS) ×6 IMPLANT
RELOAD STAPLER BLUE 60MM (STAPLE) ×3 IMPLANT
RELOAD STAPLER GOLD 60MM (STAPLE) IMPLANT
RELOAD STAPLER WHITE 60MM (STAPLE) IMPLANT
RELOAD SUT SNGL STCH ABSRB 2-0 (ENDOMECHANICALS) ×6 IMPLANT
RELOAD SUT SNGL STCH BLK 2-0 (ENDOMECHANICALS) ×4 IMPLANT
SCISSORS LAP 5X35 DISP (ENDOMECHANICALS) ×2 IMPLANT
SEALANT SURGICAL APPL DUAL CAN (MISCELLANEOUS) ×2 IMPLANT
SET IRRIG TUBING LAPAROSCOPIC (IRRIGATION / IRRIGATOR) ×2 IMPLANT
SHEARS HARMONIC ACE PLUS 36CM (ENDOMECHANICALS) ×2 IMPLANT
SLEEVE ENDOPATH XCEL 5M (ENDOMECHANICALS) ×2 IMPLANT
SOLUTION ANTI FOG 6CC (MISCELLANEOUS) ×2 IMPLANT
STAPLE ECHEON FLEX 60 POW ENDO (STAPLE) ×2 IMPLANT
STAPLER RELOAD BLUE 60MM (STAPLE) ×6
STAPLER RELOAD GOLD 60MM (STAPLE)
STAPLER RELOAD WHITE 60MM (STAPLE)
STAPLER VISISTAT 35W (STAPLE) ×2 IMPLANT
SUT MON AB 5-0 PS2 18 (SUTURE) ×2 IMPLANT
SUT RELOAD ENDO STITCH 2 48X1 (ENDOMECHANICALS) ×7
SUT RELOAD ENDO STITCH 2.0 (ENDOMECHANICALS) ×4
SUT VIC AB 2-0 SH 27 (SUTURE) ×8
SUT VIC AB 2-0 SH 27X BRD (SUTURE) ×1 IMPLANT
SUTURE RELOAD END STTCH 2 48X1 (ENDOMECHANICALS) ×7 IMPLANT
SUTURE RELOAD ENDO STITCH 2.0 (ENDOMECHANICALS) ×4 IMPLANT
SYRINGE 20CC LL (MISCELLANEOUS) ×4 IMPLANT
SYRINGE 60CC LL (MISCELLANEOUS) ×2 IMPLANT
TOWEL OR 17X26 10 PK STRL BLUE (TOWEL DISPOSABLE) ×2 IMPLANT
TRAY FOLEY CATH 14FRSI W/METER (CATHETERS) ×2 IMPLANT
TROCAR ADV FIXATION 12X100MM (TROCAR) ×1 IMPLANT
TROCAR BLADELESS OPT 5 100 (ENDOMECHANICALS) ×2 IMPLANT
TROCAR UNIVERSAL OPT 12M 100M (ENDOMECHANICALS) ×6 IMPLANT
TROCAR XCEL 12X100 BLDLESS (ENDOMECHANICALS) ×2 IMPLANT
TROCAR XCEL NON-BLD 11X100MML (ENDOMECHANICALS) ×2 IMPLANT
TUBING ENDO SMARTCAP (MISCELLANEOUS) ×2 IMPLANT
TUBING FILTER THERMOFLATOR (ELECTROSURGICAL) ×2 IMPLANT

## 2013-09-12 NOTE — Progress Notes (Signed)
NOS  S - Hurting, but has been out of bed  Mother and brother at bedside.  I re-wrote prescription  O - BP 144/72  Pulse 61  Temp(Src) 98.8 F (37.1 C) (Oral)  Resp 16  Ht 5\' 2"  (1.575 m)  Wt 197 lb 8 oz (89.585 kg)  BMI 36.11 kg/m2  SpO2 99%  Hgb - 12.0  Abdomen - okay  A&P - Doing okay post op except for pain  Alphonsa Overall, MD, [facility] Surgery Pager: [phone] Office phone:  [phone]

## 2013-09-12 NOTE — Interval H&P Note (Signed)
History and Physical Interval Note:  09/12/2013 7:03 AM  Kelli Bell  has presented today for surgery, with the diagnosis of Morbid Obesity  The various methods of treatment have been discussed with the patient and family.  She was able to drink about 1/2 of the golytely.  Her husband and family are here.  After consideration of risks, benefits and other options for treatment, the patient has consented to  Procedure(s): LAPAROSCOPIC ROUX-EN-Y GASTRIC BYPASS WITH UPPER ENDOSCOPY (N/A) as a surgical intervention .  The patient's history has been reviewed, patient examined, no change in status, stable for surgery.  I have reviewed the patient's chart and labs.  Questions were answered to the patient's satisfaction.     [name]

## 2013-09-12 NOTE — Transfer of Care (Signed)
Immediate Anesthesia Transfer of Care Note  Patient: Kelli Bell  Procedure(s) Performed: Procedure(s): LAPAROSCOPIC ROUX-EN-Y GASTRIC BYPASS WITH UPPER ENDOSCOPY WITH ENTEROLYSIS OF ADHESIONS (N/A)  Patient Location: PACU  Anesthesia Type:General  Level of Consciousness: Patient easily awoken, sedated, comfortable, cooperative, following commands, responds to stimulation.   Airway & Oxygen Therapy: Patient spontaneously breathing, ventilating well, oxygen via simple oxygen mask.  Post-op Assessment: Report given to PACU RN, vital signs reviewed and stable, moving all extremities.   Post vital signs: Reviewed and stable.  Complications: No apparent anesthesia complications

## 2013-09-12 NOTE — Anesthesia Preprocedure Evaluation (Addendum)
Anesthesia Evaluation  Patient identified by MRN, date of birth, ID band Patient awake    Reviewed: Allergy & Precautions, H&P , NPO status , Patient's Chart, lab work & pertinent test results  Airway Mallampati: II TM Distance: <3 FB Neck ROM: Full    Dental no notable dental hx.    Pulmonary sleep apnea ,  breath sounds clear to auscultation  Pulmonary exam normal       Cardiovascular hypertension, Pt. on medications Rhythm:Regular Rate:Normal     Neuro/Psych negative neurological ROS  negative psych ROS   GI/Hepatic Neg liver ROS, GERD-  Medicated,  Endo/Other  Hypothyroidism   Renal/GU negative Renal ROS  negative genitourinary   Musculoskeletal negative musculoskeletal ROS (+)   Abdominal   Peds negative pediatric ROS (+)  Hematology negative hematology ROS (+)   Anesthesia Other Findings   Reproductive/Obstetrics negative OB ROS                           Anesthesia Physical Anesthesia Plan  ASA: II  Anesthesia Plan: General   Post-op Pain Management:    Induction: Intravenous  Airway Management Planned: Oral ETT  Additional Equipment:   Intra-op Plan:   Post-operative Plan: Extubation in OR  Informed Consent: I have reviewed the patients History and Physical, chart, labs and discussed the procedure including the risks, benefits and alternatives for the proposed anesthesia with the patient or authorized representative who has indicated his/her understanding and acceptance.   Dental advisory given  Plan Discussed with: CRNA and Surgeon  Anesthesia Plan Comments:        Anesthesia Quick Evaluation

## 2013-09-12 NOTE — Op Note (Signed)
PATIENT:   Kelli Bell DOB:   [birth year] MRN:   [id number]  DATE OF PROCEDURE: 09/12/2013                   FACILITY:  [facility]  OPERATIVE REPORT  PREOPERATIVE DIAGNOSIS:  Morbid obesity.  POSTOPERATIVE DIAGNOSIS:  Morbid obesity (weight 206, BMI of 36.6).  PROCEDURE:  Laparoscopic Roux-en-Y gastric bypass, antecolic, antegastric (intraoperative upper endoscopy by Rockne Coons), Enterolysis for 15 minutes.  SURGEON:  Fenton Malling. Lucia Gaskins, MD  FIRST ASSISTANT:  Rockne Coons, MD  ANESTHESIA:  General endotracheal.  Anesthesiologist: Myrtie Soman, MD CRNA: Sharlette Dense, CRNA; Heide Scales, CRNA; Lollie Sails, CRNA  General  ESTIMATED BLOOD LOSS:  Minimal.  LOCAL ANESTHESIA:  30 cc of 6/2% Marcaine  COMPLICATIONS:  None.  INDICATION FOR SURGERY:  Kelli Bell is a 53 y.o. white  female who sees Cathlean Cower, MD as her primary care doctor.  Kelli Bell has completed our preoperative bariatric program and now comes for a laparoscopic Roux-en-Y gastric bypass.  The indications, potential complications of surgery were explained to the patient.  Potential complications of the surgery include, but are not limited to, bleeding, infection, DVT, open surgery, and long-term nutritional consequences.  OPERATIVE NOTE:  The patient taken to room #1 at [facility] where Ms. Edison Pace underwent a general endotracheal anesthetic, supervised by Anesthesiologist: Myrtie Soman, MD CRNA: Sharlette Dense, CRNA; Heide Scales, CRNA; Lollie Sails, CRNA.  The patient was given 2 g of cefoxitin at the beginning of the procedure.  A time-out was held and surgical checklist run.  The abdomen was prepped with ChloraPrep and sterilely draped.  I accessed the abdominal cavity through the left upper quadrant using a 12 mm Optiview trocar.  I placed 6 additional trocars: 5 mm subxiphoid, 12 mm right subcostal, 12 mm right paramedian, 12 mm left paramedian, 5 mm lateral subcostal, and a 11 mm below to the right of the umbilicus.  The abdomen  was insufflated and abdominal exploration carried out.  Right and left lobes of liver unremarkable.  The stomach that I could see was unremarkable.  The patient had a moderate amount of greater omentum which draped over the bowel.  Kelli Bell also had adhesions of her omentum to the right upper peritoneal cavity at her prior gall bladder incision.  I spent about 15 minutes taking these down.  I was able to push the omentum and transverse colon up and identified the ligament of Treitz to start the operation.  I measured 40 cm of the jejunum, starting at the ligament of Tritz, and divided the jejunum with a white load of 45 mm Ethicon Endo-GIA stapler.  I divided a short length into the mesentery.  I measured 100 cm of jejunum for the future gastric limb.  I put a Penrose drain on the future gastric limb of the jejunum.  I then did a side-to-side jejunojejunostomy.  I used a 45 mm white load of the Ethicon Endo-GIA stapler.  I closed the enterotomy with 2 running 2-0 Vicryl sutures. I placed two additional sutures to reinforce the closure.  I tested the JJ anastomosis with an alligator forceps and then covered this with Tisseel.  I closed the mesenteric defect with a running 2-0 silk suture with a Laparo-tye on each end.  I then divided the omentum with a Harmonic Scalpel.  I positioned the patient in reverse Trendelenburg and placed the liver retractor, which was introduced into the peritoneal cavity through a subxiphoid  5 mm trocar puncture, under the left lobe of the liver.  I then identified the gastroesophageal junction.  I went to the left at the angle of His and made a window at the left side of the esophago-gastric junction for a target as my dissection.  I then went on the lesser curve of the stomach, measured 5 cm from the gastroesophageal junction down the lesser curve and dissected into the lesser sac from the lesser curvature side of the stomach.  I did the first firing of a 45 mm blue load  Ethicon Endo-GIA stapler and then did 3 firings of the 60 mm blue load Ethicon Eschelon stapler.  This created a gastric pouch approximately 5 cm in length and 3 cm in width.  There was no bleeding from either the pouch or the stomach remnant site.  I placed Tisseel on the pouch side along the new greater curvature.  I over sewed the gastric remnant with a locking 2-0 Vicryl suture with a Laparo-tye on each end..  I then brought the jejunum ante-colic, ante-gastric up to the new stomach pouch and placed a posterior running 2-0 Vicryl suture.  I then made an enterotomy into the stomach using the Ewald as a back stop and an enterotomy into the jejunum.  I did a stapled side-to-side gastrojejunal anastomosis using these two enterotomies with a 45 mm blue load of the Ethicon Endo GIA stapler.  I tried to create a 2.5 cm gastrojejunal anastomosis.  I closed the enterotomy with a 2 running 2-0 Vicryl sutures.  I passed the Ewald tube through the gastrojejunal anastomosis and then did an anterior Connell suture running of 2-0 Vicryl suture for the anterior layer of the gastrojejunostomy.  The Ewald tube was then removed without difficulty.  I then closed the Langford defect with a figure-of-eight 2-0 silk suture between the mesentery of the transverse colon and the mesentery of the distal jejunum.  Dr. Hassell Done then scrubbed out and did an intraoperative upper endoscopy.  He identified the esophagogastric junction about 40 cm, the gastrojejunal anastomosis about 45 cm.  I clamped off the small bowel.  He insufflated air and I flooded the abdomen with saline. There was no bubbling or evidence of air leak.  He then withdrew the scope and he will dictate that portion of the operation.    I then re-inspected the anastomoses, sucked out the saline, placed Tisseel over the stomach pouch and gastrojejunal anastomosis.   The liver retractor was removed.  The trocars were removed.  There was no bleeding at any trocar  site.  The skin at each trocar site was closed with a 5-0 Monocryl suture.  I infiltrated a total about 30 cc of 0.25% Marcaine at the trocar sites.    After the skin incisions were closed with sutures they were painted with Dermabond.  The sponge and needle count were correct at the end of the case.  The patient tolerated the procedure well, was transported to the recovery room in good condition.   Alphonsa Overall, MD, [facility] Surgery Pager: [phone] Office phone:  [phone]

## 2013-09-12 NOTE — H&P (View-Only) (Signed)
Re:   Kelli Bell DOB:   1960-02-02 MRN:   [id number]  ASSESSMENT AND PLAN: 1.  Morbid obesity   Initial weight - 206, BMI - 36.6  Per the [facility], the patient is a candidate for bariatric surgery.  The patient attended our initial information session and reviewed the types of bariatric surgery.    The patient is interested in the Roux en Y Gastric Bypass.  I discussed with the patient the indications and risks of bariatric surgery.  The potential risks of surgery include, but are not limited to, bleeding, infection, leak from the bowel, DVT and PE, open surgery, long term nutrition consequences, and death.  The patient understands the importance of compliance and long term follow-up with our group after surgery.  She saw Dr. Marshall Cork just last week.  I gave her a prescription for golytely and Roxicet.  2.  Back pain  Chronic - gets injections - sees Dr. Koleen Distance 3.  History of anxiety/depression 4.  OSA  Since 2009 - on CPAP 5.  HTN x 2 years 6.  Rare migraine 7.  Hypothyroid - on replacement  Chief Complaint  Patient presents with  . Bariatric Follow Up   REFERRING PHYSICIAN: Cathlean Cower, MD  HISTORY OF PRESENT ILLNESS: Kelli Bell is a 53 y.o. (DOB: Oct 17, 1960)  white  female whose primary care physician is Cathlean Cower, MD and comes to me today for weight loss surgery. She comes with her mother, Dianna Limbo.  Her husband has had recurrence of his esophageal cancer.    We talked about delaying her surgery, but she was adamant about going in with surgery. She understands the significant risk of this operation and prolonged hospitalization or recovery could impede being with her husband. I again reviewed with her and her mother the operation, hospitalization, and the postop course and management. She said she had been to the support group, she had read a lot and she was ready to go ahead with surgery.  We talked about potential adhesions and how that effects  the operation.  UGI - 05/17/2013 - small HH Psych - 05/15/2013 - Dr. Ardath Sax  History of weight issues: The patient used our Neurosurgeon.  She has several friends who have had gastric bypass and has done well.  She has tried multiple diets:  Weight Watchers x 3, Physician Wt Loss x 2, low cholesterol diet, low cal, and Slim Fast.  She has had some success with these diets, none lasted.  One thing that she emphasized is that she is always tired.  She has really struggled since her husband was diagnosed with esophageal cancer in 2013.  But he husband is cancer free at this time.   Past Medical History  Diagnosis Date  . ANXIETY 03/30/2007  . BACK PAIN 02/20/2009  . DEPRESSION 03/30/2007  . GERD 01/13/2007  . HYPERLIPIDEMIA 02/22/2008  . MENOPAUSAL DISORDER 02/23/2008  . SINUSITIS- ACUTE-NOS 03/30/2007  . THORACIC/LUMBOSACRAL NEURITIS/RADICULITIS UNSPEC 02/23/2008  . Wheezing 01/08/2009  . HYPOTHYROIDISM 03/30/2007  . Essential hypertension, benign 05/31/2012  . Family history of anesthesia complication     "mother has hard time waking up"  . OBSTRUCTIVE SLEEP APNEA 06/29/2007    has not used c pap x 8 months due to wt loss  . H/O hiatal hernia   . Nocturia       Past Surgical History  Procedure Laterality Date  . Cholecystectomy  1980  . Jaw bone surgury  53 yo  . Tonsillectomy    . Breath tek h pylori N/A 05/05/2013    Procedure: BREATH TEK H PYLORI;  Surgeon: Shann Medal, MD;  Location: [facility];  Service: General;  Laterality: N/A;  . Abdominal hysterectomy  2004    with BSO      Current Outpatient Prescriptions  Medication Sig Dispense Refill  . acetaminophen (TYLENOL) 500 MG tablet Take 1,000 mg by mouth every 6 (six) hours as needed (Pain).      Marland Kitchen ALPRAZolam (XANAX) 0.25 MG tablet Take 0.25 mg by mouth 3 (three) times daily as needed for anxiety.      . fluticasone (FLONASE) 50 MCG/ACT nasal spray Place 1 spray into both nostrils 2 (two) times daily as needed for  allergies or rhinitis.      Marland Kitchen levothyroxine (SYNTHROID, LEVOTHROID) 88 MCG tablet Take 88 mcg by mouth every morning.       Marland Kitchen losartan (COZAAR) 50 MG tablet Take 50 mg by mouth at bedtime.      Marland Kitchen nystatin-triamcinolone ointment (MYCOLOG) Apply 1 application topically 2 (two) times daily as needed (Rash underneath flap of stomach).      Marland Kitchen omeprazole (PRILOSEC) 20 MG capsule Take 40 mg by mouth at bedtime.       . traMADol (ULTRAM) 50 MG tablet Take 50 mg by mouth every 6 (six) hours as needed (Pain).       No current facility-administered medications for this visit.      Allergies  Allergen Reactions  . Codeine Nausea Only    REVIEW OF SYSTEMS: Skin:  No history of rash.  No history of abnormal moles. Infection:  No history of hepatitis or HIV.  No history of MRSA. Neurologic:  Occasional migraine HA. Cardiac:  HTN x 2 years.  No history of seeing a cardiologist.  She said that she has been told an EKG suggested an old MI, but she has had no further eval for this. Pulmonary:   OSA/CPAP since 2009.  Endocrine:  No diabetes. Hypothyroid, on replacement. Gastrointestinal:  No history of stomach disease.  No history of liver disease. Open chole - 1980.  No history of pancreas disease.  She had a upper endo and colonoscopy by Dr. Olevia Perches in 2014. Urologic:  No history of kidney stones.  No history of bladder infections. GYN - Hysterectomy - 2004.  Sees N. Young, NP. Musculoskeletal:  Chronic pain issues with back. She is on chronic tramadol.  She sees Dr. Koleen Distance. Hematologic:  No bleeding disorder.  No history of anemia.  Not anticoagulated. Psycho-social:  The patient is oriented.   She has chronic anxiety and depression, but does not see a therapist.  Dr. Jenny Reichmann manages this.  SOCIAL and FAMILY HISTORY: Married. Husband, Liliane Channel, had esophageal cancer diagnosed in 2013 ([place]ray/ [place]/Gerhardt).  His disease has recurred and he is getting Rad tx.  He works as Hydrologist at [employer]. She does not work. She has two children:  32 yo daughter and 49 yo son.  PHYSICAL EXAM: BP 122/76  Pulse 60  Ht 5\' 2"  (1.575 m)  Wt 202 lb (91.627 kg)  BMI 36.94 kg/m2  General: WN WF who is alert and generally healthy appearing.  HEENT: Normal. Pupils equal. Neck: Supple. No mass.  No thyroid mass. Lymph Nodes:  No supraclavicular or cervical nodes. Lungs: Clear to auscultation and symmetric breath sounds. Heart:  RRR. No murmur or rub. Breasts:  Right - no mass  Left - no  mass  Abdomen: Soft. No mass. No tenderness. No hernia. Normal bowel sounds.  RUQ transverse scar and pfannenstiel scar.  She is about 1/2 apple and 1/2 pear. Extremities:  Good strength and ROM  in upper and lower extremities. Neurologic:  Grossly intact to motor and sensory function. Psychiatric: Has normal mood and affect. Behavior is normal.   DATA REVIEWED: Epic and notes.  Alphonsa Overall, MD,  [facility] Surgery, [address]  [place], [place]    [place] Phone:  [phone] FAX:  [phone]

## 2013-09-12 NOTE — Anesthesia Postprocedure Evaluation (Signed)
  Anesthesia Post-op Note  Patient: Kelli Bell  Procedure(s) Performed: Procedure(s) (LRB): LAPAROSCOPIC ROUX-EN-Y GASTRIC BYPASS WITH UPPER ENDOSCOPY WITH ENTEROLYSIS OF ADHESIONS (N/A)  Patient Location: PACU  Anesthesia Type: General  Level of Consciousness: awake and alert   Airway and Oxygen Therapy: Patient Spontanous Breathing  Post-op Pain: mild  Post-op Assessment: Post-op Vital signs reviewed, Patient's Cardiovascular Status Stable, Respiratory Function Stable, Patent Airway and No signs of Nausea or vomiting  Last Vitals:  Filed Vitals:   09/12/13 1142  BP:   Pulse:   Temp: 36 C  Resp:     Post-op Vital Signs: stable   Complications: No apparent anesthesia complications

## 2013-09-13 ENCOUNTER — Inpatient Hospital Stay (HOSPITAL_COMMUNITY): Payer: Managed Care, Other (non HMO)

## 2013-09-13 ENCOUNTER — Encounter (HOSPITAL_COMMUNITY): Payer: Self-pay | Admitting: Surgery

## 2013-09-13 DIAGNOSIS — Z09 Encounter for follow-up examination after completed treatment for conditions other than malignant neoplasm: Secondary | ICD-10-CM

## 2013-09-13 LAB — HEMOGLOBIN AND HEMATOCRIT, BLOOD
HCT: 32 % — ABNORMAL LOW (ref 36.0–46.0)
Hemoglobin: 11 g/dL — ABNORMAL LOW (ref 12.0–15.0)

## 2013-09-13 LAB — CBC WITH DIFFERENTIAL/PLATELET
Basophils Absolute: 0 10*3/uL (ref 0.0–0.1)
Basophils Relative: 0 % (ref 0–1)
Eosinophils Absolute: 0 10*3/uL (ref 0.0–0.7)
Eosinophils Relative: 0 % (ref 0–5)
HCT: 33.6 % — ABNORMAL LOW (ref 36.0–46.0)
Hemoglobin: 11.6 g/dL — ABNORMAL LOW (ref 12.0–15.0)
Lymphocytes Relative: 17 % (ref 12–46)
Lymphs Abs: 1.5 10*3/uL (ref 0.7–4.0)
MCH: 30.4 pg (ref 26.0–34.0)
MCHC: 34.5 g/dL (ref 30.0–36.0)
MCV: 88 fL (ref 78.0–100.0)
Monocytes Absolute: 1.1 10*3/uL — ABNORMAL HIGH (ref 0.1–1.0)
Monocytes Relative: 12 % (ref 3–12)
Neutro Abs: 6.4 10*3/uL (ref 1.7–7.7)
Neutrophils Relative %: 71 % (ref 43–77)
Platelets: 299 10*3/uL (ref 150–400)
RBC: 3.82 MIL/uL — ABNORMAL LOW (ref 3.87–5.11)
RDW: 12.6 % (ref 11.5–15.5)
WBC: 9 10*3/uL (ref 4.0–10.5)

## 2013-09-13 MED ORDER — IOHEXOL 300 MG/ML  SOLN
50.0000 mL | Freq: Once | INTRAMUSCULAR | Status: AC | PRN
  Administered 2013-09-13: 50 mL via ORAL

## 2013-09-13 NOTE — Progress Notes (Signed)
Nutrition Education Note  Received consult for diet education per DROP protocol.   Discussed 2 week post op diet with pt. Emphasized that liquids must be non carbonated, non caffeinated, and sugar free. Fluid goals discussed. Pt to follow up with outpatient bariatric RD for further diet progression after 2 weeks. Multivitamins and minerals also reviewed. Teach back method used, pt expressed understanding, expect good compliance. Pt had questions about multivitamins which were answered.    Diet: First 2 Weeks  You will see the nutritionist about two (2) weeks after your surgery. The nutritionist will increase the types of foods you can eat if you are handling liquids well:  If you have severe vomiting or nausea and cannot handle clear liquids lasting longer than 1 day, call your surgeon  Protein Shake  Drink at least 2 ounces of shake 5-6 times per day  Each serving of protein shakes (usually 8 - 12 ounces) should have a minimum of:  15 grams of protein  And no more than 5 grams of carbohydrate  Goal for protein each day:  Men = 80 grams per day  Women = 60 grams per day  Protein powder may be added to fluids such as non-fat milk or Lactaid milk or Soy milk (limit to 35 grams added protein powder per serving)   Hydration  Slowly increase the amount of water and other clear liquids as tolerated (See Acceptable Fluids)  Slowly increase the amount of protein shake as tolerated  Sip fluids slowly and throughout the day  May use sugar substitutes in small amounts (no more than 6 - 8 packets per day; i.e. Splenda)   Fluid Goal  The first goal is to drink at least 8 ounces of protein shake/drink per day (or as directed by the nutritionist); some examples of protein shakes are [employer], [employer], EAS Edge HP, and Unjury. See handout from pre-op Bariatric Education Class:  Slowly increase the amount of protein shake you drink as tolerated  You may find it easier to slowly sip shakes  throughout the day  It is important to get your proteins in first  Your fluid goal is to drink 64 - 100 ounces of fluid daily  It may take a few weeks to build up to this  32 oz (or more) should be clear liquids  And  32 oz (or more) should be full liquids (see below for examples)  Liquids should not contain sugar, caffeine, or carbonation   Clear Liquids:  Water or Sugar-free flavored water (i.e. Fruit H2O, Propel)  Decaffeinated coffee or tea (sugar-free)  Crystal Lite, Wyler's Lite, Minute Maid Lite  Sugar-free Jell-O  Bouillon or broth  Sugar-free Popsicle: *Less than 20 calories each; Limit 1 per day   Full Liquids:  Protein Shakes/Drinks + 2 choices per day of other full liquids  Full liquids must be:  No More Than 12 grams of Carbs per serving  No More Than 3 grams of Fat per serving  Strained low-fat cream soup  Non-Fat milk  Fat-free Lactaid Milk  Sugar-free yogurt (Dannon Lite & Fit, [place] yogurt)     Zimmerman MS, RD, [place] [phone] Pager [phone] Weekend/After Hours Pager

## 2013-09-13 NOTE — Progress Notes (Signed)
Patient alert and oriented, Post op day 1.  Provided support and encouragement.  Encouraged pulmonary toilet, ambulation and small sips of liquids when swallow study returned satisfactorily.  All questions answered.  Will continue to monitor. 

## 2013-09-13 NOTE — Progress Notes (Signed)
General Surgery Note  LOS: 1 day  POD -  1 Day Post-Op  Assessment/Plan: 1.  LAPAROSCOPIC ROUX-EN-Y GASTRIC BYPASS WITH UPPER ENDOSCOPY WITH ENTEROLYSIS OF ADHESIONS - 09/12/2013 - D. Remington Highbaugh  For Morbid obesity - Initial weight - 206, BMI - 36.6  Doing well early.  For swallow today.  2. Back pain   Chronic - gets injections - sees Dr. Koleen Distance  3. History of anxiety/depression  4. OSA   Since 2009 - on CPAP  5. HTN x 2 years  6. Rare migraine  7. Hypothyroid - on replacement 8.  DVT prophylaxis - SQ Heparin   Active Problems:   Morbid obesity, weight - 206, BMI - 36.6   Subjective:  Had pain last night and was up much of last night.  Her mother is at the bedside. Objective:   Filed Vitals:   09/13/13 0554  BP: 114/63  Pulse: 55  Temp: 98.8 F (37.1 C)  Resp: 18     Intake/Output from previous day:  08/18 0701 - 08/19 0700 In: 6317.5 [I.V.:6317.5] Out: 6433 [Urine:3750]  Intake/Output this shift:      Physical Exam:   General: Obese WF who is alert and oriented.    HEENT: Normal. Pupils equal. .   Lungs: Clear    Abdomen: Soft    Wound: Okay     Lab Results:    Recent Labs  09/12/13 1646 09/13/13 0457  WBC  --  9.0  HGB 12.0 11.6*  HCT 34.5* 33.6*  PLT  --  299    BMET  No results found for this basename: NA, K, CL, CO2, GLUCOSE, BUN, CREATININE, CALCIUM,  in the last 72 hours  PT/INR  No results found for this basename: LABPROT, INR,  in the last 72 hours  ABG  No results found for this basename: PHART, PCO2, PO2, HCO3,  in the last 72 hours   Studies/Results:  No results found.   Anti-infectives:   Anti-infectives   Start     Dose/Rate Route Frequency Ordered Stop   09/12/13 0915  cefOXitin (MEFOXIN) 2 g in dextrose 5 % 50 mL IVPB     2 g 100 mL/hr over 30 Minutes Intravenous  Once 09/12/13 0901 09/12/13 0945   09/12/13 0537  cefOXitin (MEFOXIN) 2 g in dextrose 5 % 50 mL IVPB     2 g 100 mL/hr over 30 Minutes Intravenous On call to  O.R. 09/12/13 0537 09/12/13 0715      Alphonsa Overall, MD, FACS Pager: [facility] Office: [phone] 09/13/2013

## 2013-09-13 NOTE — Progress Notes (Signed)
VASCULAR LAB PRELIMINARY  PRELIMINARY  PRELIMINARY  PRELIMINARY  Bilateral lower extremity venous duplex  completed.    Preliminary report:  Bilateral:  No evidence of DVT, superficial thrombosis, or Baker's Cyst.    Makale Pindell, RVT 09/13/2013, 11:29 AM

## 2013-09-13 NOTE — Care Management Note (Signed)
    Page 1 of 1   09/13/2013     11:22:31 AM CARE MANAGEMENT NOTE 09/13/2013  Patient:  Kelli Bell, Kelli Bell   Account Number:  [id number]  Date Initiated:  09/13/2013  Documentation initiated by:  Sunday Spillers  Subjective/Objective Assessment:   53 yo female admitted s/p gastric bypass. PTA lived at home with spouse.     Action/Plan:   Home when stable   Anticipated DC Date:  09/15/2013   Anticipated DC Plan:  [place]  CM consult      Choice offered to / List presented to:             Status of service:  Completed, signed off Medicare Important Message given?   (If response is "NO", the following Medicare IM given date fields will be blank) Date Medicare IM given:   Medicare IM given by:   Date Additional Medicare IM given:   Additional Medicare IM given by:    Discharge Disposition:  HOME/SELF CARE  Per UR Regulation:  Reviewed for med. necessity/level of care/duration of stay  If discussed at [place] of Stay Meetings, dates discussed:    Comments:

## 2013-09-14 LAB — CBC WITH DIFFERENTIAL/PLATELET
Basophils Absolute: 0 10*3/uL (ref 0.0–0.1)
Basophils Relative: 0 % (ref 0–1)
Eosinophils Absolute: 0 10*3/uL (ref 0.0–0.7)
Eosinophils Relative: 1 % (ref 0–5)
HCT: 31.6 % — ABNORMAL LOW (ref 36.0–46.0)
Hemoglobin: 10.5 g/dL — ABNORMAL LOW (ref 12.0–15.0)
Lymphocytes Relative: 41 % (ref 12–46)
Lymphs Abs: 3.1 10*3/uL (ref 0.7–4.0)
MCH: 30.1 pg (ref 26.0–34.0)
MCHC: 33.2 g/dL (ref 30.0–36.0)
MCV: 90.5 fL (ref 78.0–100.0)
Monocytes Absolute: 0.9 10*3/uL (ref 0.1–1.0)
Monocytes Relative: 12 % (ref 3–12)
Neutro Abs: 3.5 10*3/uL (ref 1.7–7.7)
Neutrophils Relative %: 46 % (ref 43–77)
Platelets: 259 10*3/uL (ref 150–400)
RBC: 3.49 MIL/uL — ABNORMAL LOW (ref 3.87–5.11)
RDW: 13 % (ref 11.5–15.5)
WBC: 7.6 10*3/uL (ref 4.0–10.5)

## 2013-09-14 NOTE — Discharge Instructions (Signed)
CENTRAL  SURGERY - DISCHARGE INSTRUCTIONS TO PATIENT  Activity:  Driving - May drive in 3 or 4 days, if doing well.   Lifting - Take it easy for 7 days, then no limit  Wound Care:   May shower tonight  Diet:  Gastric bypass diet  Follow up appointment:  Call Dr. Pollie Friar office [facility]) at [phone] for an appointment in 2 weeks.  Medications and dosages:  Resume your home medications.  You have a prescription for:  Oxycodone Elixir  Call Dr. Lucia Gaskins or his office  [phone]) if you have:  Temperature greater than 100.4,  Persistent nausea and vomiting,  Severe uncontrolled pain,  Redness, tenderness, or signs of infection (pain, swelling, redness, odor or green/yellow discharge around the site),  Difficulty breathing, headache or visual disturbances,  Any other questions or concerns you may have after discharge.  In an emergency, call 911 or go to an Emergency Department at a nearby hospital.       GASTRIC BYPASS/SLEEVE  Home Care Instructions   These instructions are to help you care for yourself when you go home.  Call: If you have any problems. Call [phone] and ask for the surgeon on call If you need immediate assistance come to the ER at [facility]. Tell the ER staff you are a new post-op gastric bypass or gastric sleeve patient  Signs and symptoms to report: Severe  vomiting or nausea If you cannot handle clear liquids for longer than 1 day, call your surgeon Abdominal pain which does not get better after taking your pain medication Fever greater than 100.4  F and chills Heart rate over 100 beats a minute Trouble breathing Chest pain Redness,  swelling, drainage, or foul odor at incision (surgical) sites If your incisions open or pull apart Swelling or pain in calf (lower leg) Diarrhea (Loose bowel movements that happen often), frequent watery, uncontrolled bowel movements Constipation, (no bowel movements for 3 days) if this  happens: Take Milk of Magnesia, 2 tablespoons by mouth, 3 times a day for 2 days if needed Stop taking Milk of Magnesia once you have had a bowel movement Call your doctor if constipation continues Or Take Miralax  (instead of Milk of Magnesia) following the label instructions Stop taking Miralax once you have had a bowel movement Call your doctor if constipation continues Anything you think is abnormal for you   Normal side effects after surgery: Unable to sleep at night or unable to concentrate Irritability Being tearful (crying) or depressed  These are common complaints, possibly related to your anesthesia, stress of surgery, and change in lifestyle, that usually go away a few weeks after surgery. If these feelings continue, call your medical doctor.  Wound Care: You may have surgical glue, steri-strips, or staples over your incisions after surgery Surgical glue: Looks like clear film over your incisions and will wear off a little at a time Steri-strips: Adhesive strips of tape over your incisions. You may notice a yellowish color on skin under the steri-strips. This is used to make the steri-strips stick better. Do not pull the steri-strips off - let them fall off Staples: Staples may be removed before you leave the hospital If you go home with staples, call [facility] for an appointment with your surgeons nurse to have staples removed 10 days after surgery, [phone] Showering: You may shower two (2) days after your surgery unless your surgeon tells you differently Wash gently around incisions with warm soapy water,  rinse well, and gently pat dry If you have a drain (tube from your incision), you may need someone to hold this while you shower No tub baths until staples are removed and incisions are healed   Medications: Medications should be liquid or crushed if larger than the size of a dime Extended release pills (medication that releases a little bit at a time  through the  day) should not be crushed Depending on the size and number of medications you take, you may need to space (take a few throughout the day)/change the time you take your medications so that you do not over-fill your pouch (smaller stomach) Make sure you follow-up with you primary care physician to make medication changes needed during rapid weight loss and life -style changes If you have diabetes, follow up with your doctor that orders your diabetes medication(s) within one week after surgery and check your blood sugar regularly  Do not drive while taking narcotics (pain medications)  Do not take acetaminophen (Tylenol) and Roxicet or Lortab Elixir at the same time since these pain medications contain acetaminophen   Diet:  First 2 Weeks You will see the nutritionist about two (2) weeks after your surgery. The nutritionist will increase the types of foods you can eat if you are handling liquids well: If you have severe vomiting or nausea and cannot handle clear liquids lasting longer than 1 day call your surgeon Protein Shake Drink at least 2 ounces of shake 5-6 times per day Each serving of protein shakes (usually 8-12 ounces) should have a minimum of: 15 grams of protein And no more than 5 grams of carbohydrate Goal for protein each day: Men = 80 grams per day Women = 60 grams per day    Protein powder may be added to fluids such as non-fat milk or Lactaid milk or Soy milk (limit to 35 grams added protein powder per serving)  Hydration Slowly increase the amount of water and other clear liquids as tolerated (See Acceptable Fluids) Slowly increase the amount of protein shake as tolerated Sip fluids slowly and throughout the day May use sugar substitutes in small amounts (no more than 6-8 packets per day; i.e. Splenda)  Fluid Goal The first goal is to drink at least 8 ounces of protein shake/drink per day (or as directed by the nutritionist); some examples of protein shakes  are [employer], [employer], EAS Edge HP, and Unjury. - See handout from pre-op Bariatric Education Class: Slowly increase the amount of protein shake you drink as tolerated You may find it easier to slowly sip shakes throughout the day It is important to get your proteins in first Your fluid goal is to drink 64-100 ounces of fluid daily It may take a few weeks to build up to this  32 oz. (or more) should be clear liquids And 32 oz. (or more) should be full liquids (see below for examples) Liquids should not contain sugar, caffeine, or carbonation  Clear Liquids: Water of Sugar-free flavored water (i.e. Fruit HO, Propel) Decaffeinated coffee or tea (sugar-free) Crystal lite, Wylers Lite, Minute Maid Lite Sugar-free Jell-O Bouillon or broth Sugar-free Popsicle:    - Less than 20 calories each; Limit 1 per day  Full Liquids:                   Protein Shakes/Drinks + 2 choices per day of other full liquids Full liquids must be: No More Than 12 grams of Carbs per serving No More  Than 3 grams of Fat per serving Strained low-fat cream soup Non-Fat milk Fat-free Lactaid Milk Sugar-free yogurt (Dannon Lite & Fit, Greek yogurt)    Vitamins and Minerals Start 1 day after surgery unless otherwise directed by your surgeon 2 Chewable Multivitamin / Multimineral Supplement with iron (i.e. Centrum for Adults) Vitamin B-12, 350-500 micrograms sub-lingual (place tablet under the tongue) each day Chewable Calcium Citrate with Vitamin D-3 (Example: 3 Chewable Calcium  Plus 600 with Vitamin D-3) Take 500 mg three (3) times a day for a total of 1500 mg each day Do not take all 3 doses of calcium at one time as it may cause constipation, and you can only absorb 500 mg at a time Do not mix multivitamins containing iron with calcium supplements;  take 2 hours apart Do not substitute Tums (calcium carbonate) for your calcium Menstruating women and those at risk for anemia ( a blood disease  that causes weakness) may need extra iron Talk to your doctor to see if you need more iron If you need extra iron: Total daily Iron recommendation (including Vitamins) is 50 to 100 mg Iron/day Do not stop taking or change any vitamins or minerals until you talk to your nutritionist or surgeon Your nutritionist and/or surgeon must approve all vitamin and mineral supplements   Activity and Exercise: It is important to continue walking at home. Limit your physical activity as instructed by your doctor. During this time, use these guidelines: Do not lift anything greater than ten  (10) pounds for at least two (2) weeks Do not go back to work or drive until Engineer, production says you can You may have sex when you feel comfortable It is VERY important for female patients to use a reliable birth control method; fertility often increase after surgery Do not get pregnant for at least 18 months Start exercising as soon as your doctor tells you that you can Make sure your doctor approves any physical activity Start with a simple walking program Walk 5-15 minutes each day, 7 days per week Slowly increase until you are walking 30-45 minutes per day Consider joining our [employer] program. [phone] or email [email or web address]   Special Instructions Things to remember: Free counseling is available for you and your family through collaboration between [facility] and [place]. Please call [phone] and leave a message Use your CPAP when sleeping if this applies to you Consider buying a medical alert bracelet that says you had lap-band surgery     You will likely have your first fill (fluid added to your band) 6 - 8 weeks after surgery [facility] has a free Bariatric Surgery Support Group that meets monthly, the 3rd Thursday, [place]. You can see classes online at [email or web address] It is very important to keep all follow up appointments with your surgeon,  nutritionist, primary care physician, and behavioral health practitioner After the first year, please follow up with your bariatric surgeon and nutritionist at least once a year in order to maintain best weight loss results                    [facility]:  [place]: [phone]               Bariatric Nurse Coordinator: [phone]  Gastric Bypass/Sleeve Home Care Instructions  Rev. 02/2012  Reviewed and Endorsed                                                    by [facility] Patient Education Committee, Jan, 2014

## 2013-09-14 NOTE — Discharge Summary (Signed)
Physician Discharge Summary  Patient ID:  Kelli Bell  MRN: [id number]  DOB/AGE: [id number] 54 y.o.  Admit date: 09/12/2013 Discharge date: 09/14/2013  Discharge Diagnoses:  1.  Morbid obesity - Initial weight - 206, BMI - 36.6   2. Back pain   Chronic - gets injections - sees Dr. Koleen Distance  3. History of anxiety/depression  4. OSA   Since 2009 - on CPAP  5. HTN x 2 years  6. Rare migraine  7. Hypothyroid - on replacement   Active Problems:   Morbid obesity, weight - 206, BMI - 36.6  Operation: Procedure(s): LAPAROSCOPIC ROUX-EN-Y GASTRIC BYPASS WITH UPPER ENDOSCOPY WITH ENTEROLYSIS OF ADHESIONS on 09/12/2013 - D. [facility]  Discharged Condition: good  Hospital Course: Kelli Bell is an 53 y.o. female whose primary care physician is Cathlean Cower, MD and who was admitted 09/12/2013 with a chief complaint of Morbid obesity.   She was brought to the operating room on 09/12/2013 and underwent  LAPAROSCOPIC ROUX-EN-Y GASTRIC BYPASS WITH UPPER ENDOSCOPY WITH ENTEROLYSIS OF ADHESIONS.  On the first post op day, she had a UGI swallow which was negative.  She had dopplers of her LE which were negative.  She has tolerated water. She is not 2 days post op and ready to go home. Her mother is at the bedside.  The discharge instructions were reviewed with the patient.  Consults: None  Significant Diagnostic Studies: Results for orders placed during the hospital encounter of 09/12/13  HEMOGLOBIN AND HEMATOCRIT, BLOOD      Result Value Ref Range   Hemoglobin 12.0  12.0 - 15.0 g/dL   HCT 34.5 (*) 36.0 - 46.0 %  CBC WITH DIFFERENTIAL      Result Value Ref Range   WBC 9.0  4.0 - 10.5 K/uL   RBC 3.82 (*) 3.87 - 5.11 MIL/uL   Hemoglobin 11.6 (*) 12.0 - 15.0 g/dL   HCT 33.6 (*) 36.0 - 46.0 %   MCV 88.0  78.0 - 100.0 fL   MCH 30.4  26.0 - 34.0 pg   MCHC 34.5  30.0 - 36.0 g/dL   RDW 12.6  11.5 - 15.5 %   Platelets 299  150 - 400 K/uL   Neutrophils Relative % 71  43 - 77 %   Neutro Abs 6.4   1.7 - 7.7 K/uL   Lymphocytes Relative 17  12 - 46 %   Lymphs Abs 1.5  0.7 - 4.0 K/uL   Monocytes Relative 12  3 - 12 %   Monocytes Absolute 1.1 (*) 0.1 - 1.0 K/uL   Eosinophils Relative 0  0 - 5 %   Eosinophils Absolute 0.0  0.0 - 0.7 K/uL   Basophils Relative 0  0 - 1 %   Basophils Absolute 0.0  0.0 - 0.1 K/uL  HEMOGLOBIN AND HEMATOCRIT, BLOOD      Result Value Ref Range   Hemoglobin 11.0 (*) 12.0 - 15.0 g/dL   HCT 32.0 (*) 36.0 - 46.0 %  CBC WITH DIFFERENTIAL      Result Value Ref Range   WBC 7.6  4.0 - 10.5 K/uL   RBC 3.49 (*) 3.87 - 5.11 MIL/uL   Hemoglobin 10.5 (*) 12.0 - 15.0 g/dL   HCT 31.6 (*) 36.0 - 46.0 %   MCV 90.5  78.0 - 100.0 fL   MCH 30.1  26.0 - 34.0 pg   MCHC 33.2  30.0 - 36.0 g/dL   RDW 13.0  11.5 - 15.5 %  Platelets 259  150 - 400 K/uL   Neutrophils Relative % 46  43 - 77 %   Neutro Abs 3.5  1.7 - 7.7 K/uL   Lymphocytes Relative 41  12 - 46 %   Lymphs Abs 3.1  0.7 - 4.0 K/uL   Monocytes Relative 12  3 - 12 %   Monocytes Absolute 0.9  0.1 - 1.0 K/uL   Eosinophils Relative 1  0 - 5 %   Eosinophils Absolute 0.0  0.0 - 0.7 K/uL   Basophils Relative 0  0 - 1 %   Basophils Absolute 0.0  0.0 - 0.1 K/uL    Dg Ugi W/water Sol Cm  09/13/2013   CLINICAL DATA:  One day postop from gastric bypass surgery.  EXAM: WATER SOLUBLE UPPER GI SERIES  TECHNIQUE: Single-column upper GI series was performed using water soluble contrast.  CONTRAST:  48mL OMNIPAQUE IOHEXOL 300 MG/ML  SOLN  COMPARISON:  None.  FLUOROSCOPY TIME:  1 min 46 seconds  FINDINGS: Scout radiograph: Unremarkable bowel gas pattern. Surgical clips from prior cholecystectomy noted.  Esophagus:  No evidence of esophageal mass or stricture.  Stomach: Expected postoperative appearance of gastric pouch is seen. Prompt contrast emptying from the gastric pouch is demonstrated. There is no evidence of contrast leak or extravasation.  Small Bowel: Efferent small bowel shows no evidence of dilatation or obstruction. Jejunal  fold pattern is within normal limits. There is no evidence contrast leak or extravasation.  Other:  None.  IMPRESSION: Normal postop appearance status post gastric bypass surgery. No evidence of contrast leak, obstruction, or other complication.   Electronically Signed   By: Earle Gell M.D.   On: 09/13/2013 10:12    Discharge Exam:  Filed Vitals:   09/14/13 0530  BP: 106/56  Pulse: 66  Temp: 98.1 F (36.7 C)  Resp: 18    General: Obese WN WF who is alert and generally healthy appearing.  Lungs: Clear to auscultation and symmetric breath sounds. Heart:  RRR. No murmur or rub. Abdomen: Soft. No mass.  Normal bowel sounds. Her incision look good.   Discharge Medications:     Medication List         acetaminophen 500 MG tablet  Commonly known as:  TYLENOL  Take 1,000 mg by mouth every 6 (six) hours as needed (Pain).     ALPRAZolam 0.25 MG tablet  Commonly known as:  XANAX  Take 0.25 mg by mouth 3 (three) times daily as needed for anxiety.     fluticasone 50 MCG/ACT nasal spray  Commonly known as:  FLONASE  Place 1 spray into both nostrils 2 (two) times daily as needed for allergies or rhinitis.     levothyroxine 88 MCG tablet  Commonly known as:  SYNTHROID, LEVOTHROID  Take 88 mcg by mouth every morning.     losartan 50 MG tablet  Commonly known as:  COZAAR  Take 50 mg by mouth at bedtime.     nystatin-triamcinolone ointment  Commonly known as:  MYCOLOG  Apply 1 application topically 2 (two) times daily as needed (Rash underneath flap of stomach).     omeprazole 20 MG capsule  Commonly known as:  PRILOSEC  Take 40 mg by mouth at bedtime.     traMADol 50 MG tablet  Commonly known as:  ULTRAM  Take 50 mg by mouth every 6 (six) hours as needed (Pain).        Disposition: 01-Home or Self Care  Discharge Instructions   Increase activity slowly    Complete by:  As directed           Activity:  Driving - May drive in 3 or 4 days, if doing  well.   Lifting - Take it easy for 7 days, then no limit  Wound Care:   May shower tonight  Diet:  Gastric bypass diet  Follow up appointment:  Call Dr. Pollie Friar office [facility]) at [phone] for an appointment in 2 weeks.  Medications and dosages:  Resume your home medications.  You have a prescription for:  Oxycodone Elixir   Signed: Alphonsa Overall, M.D., [facility] Office:  [phone]  09/14/2013, 7:07 AM

## 2013-09-14 NOTE — Progress Notes (Signed)
Patient alert and oriented, pain is controlled. Patient is tolerating fluids, advanced to protein shake today. Reviewed Gastric Bypass discharge instructions with patient and patient is able to articulate understanding. Provided information on BELT program, Support Group and [facility] outpatient pharmacy. All questions answered, will continue to monitor.    

## 2013-09-14 NOTE — Progress Notes (Signed)
Nurse reviewed discharge instructions with pt.  Pt verbalized understanding of discharge instructions, new medications and follow up appointments. No concerns at time of discharge. 

## 2013-09-18 ENCOUNTER — Other Ambulatory Visit: Payer: Self-pay | Admitting: Internal Medicine

## 2013-09-19 NOTE — Telephone Encounter (Signed)
Done hardcopy to [facility]  

## 2013-09-19 NOTE — Telephone Encounter (Signed)
Faxed hardcopy to [facility]

## 2013-09-21 ENCOUNTER — Telehealth (HOSPITAL_COMMUNITY): Payer: Self-pay

## 2013-09-21 NOTE — Telephone Encounter (Signed)
Made discharge phone call to patient per DROP protocol. Asking the following questions.    1. Do you have someone to care for you now that you are home?  y 2. Are you having pain now that is not relieved by your pain medication? n   3. Are you able to drink the recommended daily amount of fluids (48 ounces minimum/day) and protein (60-80 grams/day) as prescribed by the dietitian or nutritional counselor?  y 48. Are you taking the vitamins and minerals as prescribed?  y 5. Do you have the "on call" number to contact your surgeon if you have a problem or question?  y 66. Are your incisions free of redness, swelling or drainage? (If steri strips, address that these can fall off, shower as tolerated) y 7. Have your bowels moved since your surgery?  If not, are you passing gas?  y 8. Are you up and walking 3-4 times per day?  y    1. Do you have an appointment made to see your surgeon in the next month?  y 2. Were you provided your discharge medications before your surgery or before you were discharged from the hospital and are you taking them without problem?  y 3. Were you provided phone numbers to the clinic/surgeon's office?  y 4. Did you watch the patient education video module in the (clinic, surgeon's office, etc.) before your surgery? y 51. Do you have a discharge checklist that was provided to you in the hospital to reference with instructions on how to take care of yourself after surgery?  y 6. Did you see a dietitian or nutritional counselor while you were in the hospital?  y 35. Do you have an appointment to see a dietitian or nutritional counselor in the next month?  y

## 2013-09-25 ENCOUNTER — Encounter: Payer: Managed Care, Other (non HMO) | Admitting: Dietician

## 2013-09-25 ENCOUNTER — Ambulatory Visit: Payer: Managed Care, Other (non HMO) | Admitting: Dietician

## 2013-09-25 DIAGNOSIS — Z6836 Body mass index (BMI) 36.0-36.9, adult: Secondary | ICD-10-CM | POA: Diagnosis not present

## 2013-09-25 DIAGNOSIS — Z713 Dietary counseling and surveillance: Secondary | ICD-10-CM | POA: Diagnosis not present

## 2013-09-25 DIAGNOSIS — Z01818 Encounter for other preprocedural examination: Secondary | ICD-10-CM | POA: Diagnosis not present

## 2013-09-25 NOTE — Progress Notes (Signed)
Appt start time: 515 end time:  545.  2 Week Post-Operative Nutrition Appointment  Patient was seen on 09/25/2013 for Post[facility]ative Nutrition [facility]  Kelli Bell returns today stating that she has been able to tolerate protien shakes and other recommended liquids. She had some nausea yesterday. Changed vitamins to liquid form, as the chewables have been causing nausea.  Surgery date: 09/12/2013 Surgery type: RYGB Start weight at [facility]: 203 lbs on 08/08/2013 Weight today: 189.5 lbs Weight change: 14 lbs  TANITA  BODY COMP RESULTS  08/28/13 09/25/13   BMI (kg/m^2) 36.1 33.6   Fat Mass (lbs) 95.0 83.5   Fat Free Mass (lbs) 108.5 106   Total Body Water (lbs) 79.5 77.5   The following the learning objectives were met by the patient during this course:  Identifies Phase 3A (Soft, High Proteins) Dietary Goals and will begin from 2 weeks post-operatively to 2 months post-operatively  Identifies appropriate sources of fluids and proteins   States protein recommendations and appropriate sources post-operatively  Identifies the need for appropriate texture modifications, mastication, and bite sizes when consuming solids  Identifies appropriate multivitamin and calcium sources post-operatively  Describes the need for physical activity post-operatively and will follow MD recommendations  States when to call healthcare provider regarding medication questions or post-operative complications  Handouts given during class include:  Phase 3A: Soft, High Protein Diet Handout  Follow-Up Plan: Patient will follow-up at [facility] in 4 weeks for 6 week post-op nutrition visit for diet advancement per MD.

## 2013-09-25 NOTE — Patient Instructions (Addendum)
Goals:  Follow Phase 3A: Soft High Protein Phase  Eat 3-6 small meals/snacks, every 3-5 hrs  Increase lean protein foods to meet 60g goal  Increase fluid intake to 64oz +  Avoid drinking 15 minutes before, during and 30 minutes after eating  Aim for >30 min of physical activity daily per MD  -Try gasX strips -Try ginger tea for nausea   Start weight at [facility]: 203 lbs on 08/08/2013 Weight today: 189.5 lbs Weight change: 14 lbs  TANITA  BODY COMP RESULTS  08/28/13 09/25/13   BMI (kg/m^2) 36.1 33.6   Fat Mass (lbs) 95.0 83.5   Fat Free Mass (lbs) 108.5 106   Total Body Water (lbs) 79.5 77.5

## 2013-09-26 ENCOUNTER — Ambulatory Visit: Payer: Managed Care, Other (non HMO)

## 2013-09-26 ENCOUNTER — Ambulatory Visit: Payer: Managed Care, Other (non HMO) | Admitting: Dietician

## 2013-09-26 ENCOUNTER — Encounter: Payer: Self-pay | Admitting: Internal Medicine

## 2013-09-26 ENCOUNTER — Ambulatory Visit (INDEPENDENT_AMBULATORY_CARE_PROVIDER_SITE_OTHER): Payer: Managed Care, Other (non HMO) | Admitting: Internal Medicine

## 2013-09-26 VITALS — BP 112/72 | HR 62 | Temp 98.0°F | Wt 189.2 lb

## 2013-09-26 DIAGNOSIS — M545 Low back pain, unspecified: Secondary | ICD-10-CM

## 2013-09-26 DIAGNOSIS — R110 Nausea: Secondary | ICD-10-CM

## 2013-09-26 DIAGNOSIS — G8929 Other chronic pain: Secondary | ICD-10-CM

## 2013-09-26 DIAGNOSIS — I10 Essential (primary) hypertension: Secondary | ICD-10-CM

## 2013-09-26 DIAGNOSIS — F411 Generalized anxiety disorder: Secondary | ICD-10-CM

## 2013-09-26 DIAGNOSIS — K219 Gastro-esophageal reflux disease without esophagitis: Secondary | ICD-10-CM

## 2013-09-26 MED ORDER — ALPRAZOLAM 0.25 MG PO TABS: 0.2500 mg | ORAL_TABLET | Freq: Three times a day (TID) | ORAL | Status: DC | PRN

## 2013-09-26 MED ORDER — OMEPRAZOLE 20 MG PO CPDR: 40.0000 mg | DELAYED_RELEASE_CAPSULE | Freq: Every day | ORAL | Status: DC

## 2013-09-26 MED ORDER — TRAMADOL HCL 50 MG PO TABS: ORAL_TABLET | ORAL | Status: DC

## 2013-09-26 MED ORDER — ONDANSETRON 4 MG PO TBDP: 4.0000 mg | ORAL_TABLET | Freq: Three times a day (TID) | ORAL | Status: DC | PRN

## 2013-09-26 NOTE — Progress Notes (Signed)
Subjective:    Patient ID: Kelli Bell, female    DOB: [birth year], 53 y.o.   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  Post surgury has had epigastric pain post op with some radiation to the back between the shoulder blades.  Also occas nausea, no vomiting, has been on liquid diet x 2 wks, just started soft foods/soup last night for first time , with plan for soft diet x 6 wks, then regular diet after.  Saw nutritionist yesterday, to start BELT program soon for further diet and exercise training. On liquid vitamins regimen bid (could not take the chewable), b12 daily and calcium daily. To see Dr Lucia Gaskins tomorrow in f/u as well.    Does not currently check BP at home, Pt denies chest pain, increased sob or doe, wheezing, orthopnea, PND, increased LE swelling, palpitations, dizziness or syncope. Pt denies new neurological symptoms such as new headache, or facial or extremity weakness or numbness   Pt denies polydipsia, polyuria.  Pt states overall good compliance with meds,   Denies worsening depressive symptoms, suicidal ideation, or panic; has ongoing anxiety.   Pt continues to have recurring LBP without change in severity, bowel or bladder change, fever, wt loss,  worsening LE pain/numbness/weakness, gait change or falls - asks for pain med refill.  Needs hardcopy controlled meds as is changing pharmacies Past Medical History  Diagnosis Date  . ANXIETY 03/30/2007  . BACK PAIN 02/20/2009  . DEPRESSION 03/30/2007  . GERD 01/13/2007  . HYPERLIPIDEMIA 02/22/2008  . MENOPAUSAL DISORDER 02/23/2008  . SINUSITIS- ACUTE-NOS 03/30/2007  . THORACIC/LUMBOSACRAL NEURITIS/RADICULITIS UNSPEC 02/23/2008  . Wheezing 01/08/2009  . HYPOTHYROIDISM 03/30/2007  . Essential hypertension, benign 05/31/2012  . Family history of anesthesia complication     "mother has hard time waking up"  . OBSTRUCTIVE SLEEP APNEA  06/29/2007    has not used c pap x 8 months due to wt loss  . H/O hiatal hernia   . Nocturia    Past Surgical History  Procedure Laterality Date  . Cholecystectomy  1980  . Jaw bone surgury      53 yo  . Tonsillectomy    . Breath tek h pylori N/A 05/05/2013    Procedure: BREATH TEK H PYLORI;  Surgeon: Shann Medal, MD;  Location: [facility];  Service: General;  Laterality: N/A;  . Abdominal hysterectomy  2004    with BSO  . 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 She has never used smokeless tobacco. She reports that she does not drink alcohol or use illicit drugs. family history includes Arthritis in her other; Asthma in her brother; Breast cancer in her maternal aunt; Cancer in her mother; Colon polyps (age of onset: 7) in her father; Diabetes in her mother and paternal grandmother; Heart disease in her paternal grandmother; Hypertension in her mother; Lymphoma in her maternal grandmother; Osteoporosis in her other; Stroke in her mother; Thyroid disease in her maternal grandmother and mother. There is no history of Colon cancer. Allergies  Allergen Reactions  . Codeine Nausea Only   Current Outpatient Prescriptions on File Prior to Visit  Medication Sig Dispense  Refill  . fluticasone (FLONASE) 50 MCG/ACT nasal spray Place 1 spray into both nostrils 2 (two) times daily as needed for allergies or rhinitis.      Marland Kitchen nystatin-triamcinolone ointment (MYCOLOG) Apply 1 application topically 2 (two) times daily as needed (Rash underneath flap of stomach).      Marland Kitchen acetaminophen (TYLENOL) 500 MG tablet Take 1,000 mg by mouth every 6 (six) hours as needed (Pain).       No current facility-administered medications on file prior to visit.   Review of Systems  Constitutional:  Negative for unusual diaphoresis or other sweats  HENT: Negative for ringing in ear Eyes: Negative for double vision or worsening visual disturbance.  Respiratory: Negative for choking and stridor.   Gastrointestinal: Negative for vomiting or other signifcant bowel change Genitourinary: Negative for hematuria or decreased urine volume.  Musculoskeletal: Negative for other MSK pain or swelling Skin: Negative for color change and worsening wound.  Neurological: Negative for tremors and numbness other than noted  Psychiatric/Behavioral: Negative for decreased concentration or agitation other than above       Objective:   Physical Exam BP 112/72  Pulse 62  Temp(Src) 98 F (36.7 C) (Oral)  Wt 189 lb 4 oz (85.843 kg)  SpO2 96% VS noted,  Constitutional: Pt appears well-developed, well-nourished.  HENT: Head: NCAT.  Right Ear: External ear normal.  Left Ear: External ear normal.  Eyes: . Pupils are equal, round, and reactive to light. Conjunctivae and EOM are normal Neck: Normal range of motion. Neck supple.  Cardiovascular: Normal rate and regular rhythm.   Pulmonary/Chest: Effort normal and breath sounds normal.  Abd:  Soft, NT, ND, + BS Neurological: Pt is alert. Not confused , motor grossly intact Skin: Skin is warm. No rash Spine nontender  Psychiatric: Pt behavior is normal. No agitation. 1+ nervous    Assessment & Plan:   Wt Readings from Last 3 Encounters:  09/26/13 189 lb 4 oz (85.843 kg)  09/12/13 197 lb 8 oz (89.585 kg)  09/12/13 197 lb 8 oz (89.585 kg)

## 2013-09-26 NOTE — Progress Notes (Signed)
Pre visit review using our clinic review tool, if applicable. No additional management support is needed unless otherwise documented below in the visit note. 

## 2013-09-26 NOTE — Patient Instructions (Signed)
Please take all new medication as prescribed - the zofran Melt as needed  OK to stop the losartan  OK to decrease the prilosec to 20 mg daily  Please continue all other medications as before, and refills have been done if requested - the tramadol, and the xanax  Please have the pharmacy call with any other refills you may need.  Please continue your efforts at being more active, low cholesterol diet, and weight control.  Please keep your appointments with your specialists as you may have planned - Dr Lucia Gaskins tomorrow  Please return in 3 months, or sooner if needed

## 2013-09-27 ENCOUNTER — Other Ambulatory Visit: Payer: Self-pay | Admitting: *Deleted

## 2013-09-27 ENCOUNTER — Ambulatory Visit (INDEPENDENT_AMBULATORY_CARE_PROVIDER_SITE_OTHER): Payer: Managed Care, Other (non HMO) | Admitting: Surgery

## 2013-09-27 ENCOUNTER — Telehealth: Payer: Self-pay | Admitting: *Deleted

## 2013-09-27 ENCOUNTER — Other Ambulatory Visit (INDEPENDENT_AMBULATORY_CARE_PROVIDER_SITE_OTHER): Payer: Self-pay

## 2013-09-27 DIAGNOSIS — Z0000 Encounter for general adult medical examination without abnormal findings: Secondary | ICD-10-CM

## 2013-09-27 MED ORDER — LEVOTHYROXINE SODIUM 88 MCG PO TABS: 88.0000 ug | ORAL_TABLET | Freq: Every morning | ORAL | Status: DC

## 2013-09-27 NOTE — Telephone Encounter (Signed)
Pt walked into the office and said the [facility] will be her new pharmacy and they will need a written Rx to fill. So her gen. Synthroid 88 mcg will printed and signed for pt to take to [facility] outpatient pharmacy.

## 2013-10-02 DIAGNOSIS — R110 Nausea: Secondary | ICD-10-CM | POA: Insufficient documentation

## 2013-10-02 NOTE — Assessment & Plan Note (Signed)
[place] for d/c losartan, f/u next visit 3 mom, and home BP

## 2013-10-02 NOTE — Assessment & Plan Note (Signed)
Overall stable recurrent, for tramadol prn,  to f/u any worsening symptoms or concerns

## 2013-10-02 NOTE — Assessment & Plan Note (Signed)
[place] for trial decrease PPI to qd from BID,  to f/u any worsening symptoms or concerns

## 2013-10-02 NOTE — Assessment & Plan Note (Addendum)
Most likely related to recent surgury, For zofran melt prn,  to f/u any worsening symptoms or concerns  Note:  Total time for pt hx, exam, review of record with pt in the room, determination of diagnoses and plan for further eval and tx is > 40 min, with over 50% spent in coordination and counseling of patient

## 2013-10-02 NOTE — Assessment & Plan Note (Signed)
Stable, for xanax refill,  to f/u any worsening symptoms or concerns 

## 2013-10-12 ENCOUNTER — Other Ambulatory Visit: Payer: Self-pay | Admitting: Internal Medicine

## 2013-10-16 ENCOUNTER — Other Ambulatory Visit: Payer: Self-pay | Admitting: Internal Medicine

## 2013-10-17 NOTE — Telephone Encounter (Signed)
Faxed hardcopy for Alprazolam to [facility]

## 2013-10-17 NOTE — Telephone Encounter (Signed)
Done hardcopy to [facility]  

## 2013-10-19 ENCOUNTER — Other Ambulatory Visit: Payer: Self-pay

## 2013-10-19 MED ORDER — OMEPRAZOLE 20 MG PO CPDR: 40.0000 mg | DELAYED_RELEASE_CAPSULE | Freq: Every day | ORAL | Status: DC

## 2013-11-06 ENCOUNTER — Encounter: Payer: Managed Care, Other (non HMO) | Attending: Surgery | Admitting: Dietician

## 2013-11-06 DIAGNOSIS — Z6828 Body mass index (BMI) 28.0-28.9, adult: Secondary | ICD-10-CM | POA: Insufficient documentation

## 2013-11-06 DIAGNOSIS — Z713 Dietary counseling and surveillance: Secondary | ICD-10-CM | POA: Insufficient documentation

## 2013-11-06 NOTE — Patient Instructions (Signed)
Goals:  Follow Phase 3B: High Protein + Non-Starchy Vegetables  Eat 3-6 small meals/snacks, every 3-5 hrs  Increase lean protein foods to meet 60g goal  Increase fluid intake to 64oz +  Avoid drinking 15 minutes before, during and 30 minutes after eating  Aim for >30 min of physical activity daily   TANITA  BODY COMP RESULTS  08/28/13 09/25/13 11/06/13   BMI (kg/m^2) 36.1 33.6 29.9   Fat Mass (lbs) 95.0 83.5 68.5   Fat Free Mass (lbs) 108.5 106 100.5   Total Body Water (lbs) 79.5 77.5 73.5

## 2013-11-06 NOTE — Progress Notes (Signed)
  Follow-up visit:  8 Weeks Post-Operative RYGB Surgery  Appt start time: 1600 end time:  1635.  Primary concerns today: Post-operative Bariatric Surgery Nutrition Management.  Kelli Bell returns having lost 20 pounds. She plans to begin BELT program but currently has too much family stress as her husband was recently diagnosed with cancer. She reports being very tired from stress and not sleeping well but otherwise feeling good. Feels adjusted to medication changes. She is still having back pain that radiates around her left side. Had "dumping sensation" with steak and has trouble tolerating many meats. Prefers to drink rather than eat. Having nausea, likely due to stress.  Surgery date: 09/12/2013 Surgery type: RYGB Start weight at [facility]: 203 lbs on 08/08/2013 Weight today: 169 lbs Weight change: 20.5 lbs Total weight lost: 34 lbs  TANITA  BODY COMP RESULTS  08/28/13 09/25/13 11/06/13   BMI (kg/m^2) 36.1 33.6 29.9   Fat Mass (lbs) 95.0 83.5 68.5   Fat Free Mass (lbs) 108.5 106 100.5   Total Body Water (lbs) 79.5 77.5 73.5    Preferred Learning Style:   No preference indicated   Learning Readiness:   Ready  Dietary recall: Kelli Bell reports having something to eat every few hours. Cream soups, deli [place] with cottage cheese, tuna with low fat mayonnaise, 2 oz [place], beef, chicken, or salmon with green beans. At least 1 Premier protein shake per day.  Fluid intake: water, skim milk, Premier protein shakes (11-22oz), sugar free jello, broth Estimated total protein intake: 60+ grams per day  Medications: no longer on BP meds and bladder meds Supplementation: taking (liquid form)  Using straws: no Drinking while eating: no Hair loss: none Carbonated beverages: no N/V/D/C: nausea; soft bowel movements Dumping syndrome: "dumping sensation" with steak  Recent physical activity:  Plans to start BELT and elliptical at home  Progress Towards Goal(s):  In progress.  Handouts given  during visit include:  Phase 3B lean protein + non-starchy vegetables   Nutritional Diagnosis:  [place]-3.3 Overweight/obesity related to past poor dietary habits and physical inactivity as evidenced by patient w/ recent RYGB surgery following dietary guidelines for continued weight loss.     Intervention:  Nutrition counseling provided. Encouraged patient to begin relying more on real foods rather than protein shakes as she feels comfortable.   Teaching Method Utilized:  Visual Auditory  Barriers to learning/adherence to lifestyle change: family stress  Demonstrated degree of understanding via:  Teach Back   Monitoring/Evaluation:  Dietary intake, exercise, and body weight. Follow up in 6 weeks for 3.5 month post-op visit.

## 2013-11-10 ENCOUNTER — Other Ambulatory Visit: Payer: Self-pay

## 2013-11-13 ENCOUNTER — Encounter: Payer: Self-pay | Admitting: Internal Medicine

## 2013-11-14 ENCOUNTER — Telehealth: Payer: Self-pay

## 2013-11-14 NOTE — Telephone Encounter (Signed)
Update chart per email request.  Documented recent flu shot 11/02/2013 and d/c'd requested meds. From list per patient email.

## 2013-11-27 ENCOUNTER — Encounter: Payer: Self-pay | Admitting: Internal Medicine

## 2013-11-30 ENCOUNTER — Other Ambulatory Visit (INDEPENDENT_AMBULATORY_CARE_PROVIDER_SITE_OTHER): Payer: Self-pay

## 2013-12-07 ENCOUNTER — Telehealth: Payer: Self-pay | Admitting: *Deleted

## 2013-12-07 DIAGNOSIS — E038 Other specified hypothyroidism: Secondary | ICD-10-CM

## 2013-12-07 NOTE — Telephone Encounter (Signed)
Pt called requesting to have TSH level checked c/o hot flashes and night sweats believes thyroid off would like to have checked.

## 2013-12-07 NOTE — Telephone Encounter (Signed)
Ok, please have her schedule lab appt for TSH.

## 2013-12-07 NOTE — Telephone Encounter (Signed)
Pt informed with the below note, order placed, pt will be in on 12/08/13 @ 11:00 am

## 2013-12-08 ENCOUNTER — Other Ambulatory Visit: Payer: Managed Care, Other (non HMO)

## 2013-12-08 DIAGNOSIS — E038 Other specified hypothyroidism: Secondary | ICD-10-CM

## 2013-12-09 LAB — TSH: TSH: 0.941 u[IU]/mL (ref 0.350–4.500)

## 2013-12-19 ENCOUNTER — Other Ambulatory Visit: Payer: Self-pay

## 2013-12-19 MED ORDER — TRAMADOL HCL 50 MG PO TABS: ORAL_TABLET | ORAL | Status: DC

## 2013-12-19 MED ORDER — ALPRAZOLAM 0.25 MG PO TABS: ORAL_TABLET | ORAL | Status: DC

## 2013-12-19 NOTE — Telephone Encounter (Signed)
Both Done hardcopy to robin

## 2013-12-20 ENCOUNTER — Encounter: Payer: Managed Care, Other (non HMO) | Attending: Surgery | Admitting: Dietician

## 2013-12-20 DIAGNOSIS — Z713 Dietary counseling and surveillance: Secondary | ICD-10-CM | POA: Diagnosis not present

## 2013-12-20 DIAGNOSIS — Z6828 Body mass index (BMI) 28.0-28.9, adult: Secondary | ICD-10-CM | POA: Diagnosis not present

## 2013-12-20 NOTE — Progress Notes (Signed)
  Follow-up visit:  3 months Post-Operative RYGB Surgery  Appt start time: 445 end time:  520  Primary concerns today: Post-operative Bariatric Surgery Nutrition Management.  Kelli Bell returns having lost 8 pounds. Has had to buy all new clothes. She reports that she often feels tired. Having a hard time tolerating salads and beef. Not feeling hungry due to stress, having to make herself eat.   Surgery date: 09/12/2013 Surgery type: RYGB Start weight at [facility]: 203 lbs on 08/08/2013 (211 lbs per patient) Weight today: 161 lbs Weight change: 8 lbs Total weight lost: 50 lbs Goal weight: 135-140 lbs  TANITA  BODY COMP RESULTS  08/28/13 09/25/13 11/06/13 12/20/13   BMI (kg/m^2) 36.1 33.6 29.9 28.5   Fat Mass (lbs) 95.0 83.5 68.5 59.5   Fat Free Mass (lbs) 108.5 106 100.5 101.5   Total Body Water (lbs) 79.5 77.5 73.5 74.5    Preferred Learning Style:   No preference indicated   Learning Readiness:   Ready  Dietary recall: [name] reports having something to eat every few hours. Cream soups, string cheese, beans deli [place] with cottage cheese, tuna with low fat mayonnaise, 1-2 oz [place], pork, chicken, or salmon with green beans.  Having 1 Premier protein shake per day.  Fluid intake: water, decaf coffee, skim milk, Premier protein shakes (11oz), sugar free jello Estimated total protein intake: 60+ grams per day  Medications: no longer on BP meds and bladder meds Supplementation: taking  Using straws: no Drinking while eating: no Hair loss: none Carbonated beverages: no N/V/D/C: nausea Dumping syndrome: "dumping sensation" with steak  Recent physical activity:  Elliptical 2-3 days a week  Progress Towards Goal(s):  In progress.    Nutritional Diagnosis:  [place]-3.3 Overweight/obesity related to past poor dietary habits and physical inactivity as evidenced by patient w/ recent RYGB surgery following dietary guidelines for continued weight loss.     Intervention:  Nutrition  counseling provided.   Teaching Method Utilized:  Visual Auditory  Barriers to learning/adherence to lifestyle change: family stress  Demonstrated degree of understanding via:  Teach Back   Monitoring/Evaluation:  Dietary intake, exercise, and body weight. Follow up in 2 months for 5 month post-op visit.

## 2013-12-20 NOTE — Patient Instructions (Addendum)
-  15 grams of carbs at meals  TANITA  BODY COMP RESULTS  08/28/13 09/25/13 11/06/13 12/20/13   BMI (kg/m^2) 36.1 33.6 29.9 28.5   Fat Mass (lbs) 95.0 83.5 68.5 59.5   Fat Free Mass (lbs) 108.5 106 100.5 101.5   Total Body Water (lbs) 79.5 77.5 73.5 74.5

## 2013-12-20 NOTE — Telephone Encounter (Signed)
Faxed hardcopy for Alprazolam and tramadol to [facility]

## 2013-12-26 ENCOUNTER — Other Ambulatory Visit: Payer: Self-pay | Admitting: Women's Health

## 2013-12-27 ENCOUNTER — Ambulatory Visit (INDEPENDENT_AMBULATORY_CARE_PROVIDER_SITE_OTHER): Payer: Managed Care, Other (non HMO) | Admitting: Internal Medicine

## 2013-12-27 ENCOUNTER — Encounter: Payer: Self-pay | Admitting: Internal Medicine

## 2013-12-27 VITALS — BP 110/78 | HR 82 | Temp 98.5°F | Ht 63.0 in | Wt 158.5 lb

## 2013-12-27 DIAGNOSIS — Z0189 Encounter for other specified special examinations: Secondary | ICD-10-CM

## 2013-12-27 DIAGNOSIS — J3089 Other allergic rhinitis: Secondary | ICD-10-CM

## 2013-12-27 DIAGNOSIS — E785 Hyperlipidemia, unspecified: Secondary | ICD-10-CM

## 2013-12-27 DIAGNOSIS — Z0000 Encounter for general adult medical examination without abnormal findings: Secondary | ICD-10-CM

## 2013-12-27 DIAGNOSIS — I10 Essential (primary) hypertension: Secondary | ICD-10-CM

## 2013-12-27 DIAGNOSIS — J309 Allergic rhinitis, unspecified: Secondary | ICD-10-CM | POA: Insufficient documentation

## 2013-12-27 DIAGNOSIS — J019 Acute sinusitis, unspecified: Secondary | ICD-10-CM | POA: Insufficient documentation

## 2013-12-27 DIAGNOSIS — J018 Other acute sinusitis: Secondary | ICD-10-CM

## 2013-12-27 MED ORDER — FLUTICASONE PROPIONATE 50 MCG/ACT NA SUSP: 2.0000 | Freq: Every day | NASAL | Status: DC

## 2013-12-27 MED ORDER — LEVOFLOXACIN 250 MG PO TABS: 250.0000 mg | ORAL_TABLET | Freq: Every day | ORAL | Status: DC

## 2013-12-27 MED ORDER — FLUCONAZOLE 150 MG PO TABS: ORAL_TABLET | ORAL | Status: DC

## 2013-12-27 NOTE — Progress Notes (Signed)
Subjective:    Patient ID: Kelli Bell, female    DOB: Feb 17, 1960, 53 y.o.   MRN: [id number]  HPI   Here with 2-3 days acute onset fever, facial pain, pressure, headache, general weakness and malaise, and greenish d/c, with mild ST and cough, but pt denies chest pain, wheezing, increased sob or doe, orthopnea, PND, increased LE swelling, palpitations, dizziness or syncope. Does have several wks ongoing nasal allergy symptoms with clearish congestion, itch and sneezing, without fever, pain, ST, cough, swelling or wheezing.  Denies worsening depressive symptoms, suicidal ideation, or panic; has ongoing anxiety, not increased recently, husband doing better with met cancer after chemo, when xrt did not seem to help. Overall lost 55 lbs per pt since bariatrifc surgury aug 2015. Had recent labs, done at [facility], with improved cholesterol.  Recent tsh normal at GYN per pt after levothyroxine adjusted.  Past Medical History  Diagnosis Date  . ANXIETY 03/30/2007  . BACK PAIN 02/20/2009  . DEPRESSION 03/30/2007  . GERD 01/13/2007  . HYPERLIPIDEMIA 02/22/2008  . MENOPAUSAL DISORDER 02/23/2008  . SINUSITIS- ACUTE-NOS 03/30/2007  . THORACIC/LUMBOSACRAL NEURITIS/RADICULITIS UNSPEC 02/23/2008  . Wheezing 01/08/2009  . HYPOTHYROIDISM 03/30/2007  . Essential hypertension, benign 05/31/2012  . Family history of anesthesia complication     "mother has hard time waking up"  . OBSTRUCTIVE SLEEP APNEA 06/29/2007    has not used c pap x 8 months due to wt loss  . H/O hiatal hernia   . Nocturia    Past Surgical History  Procedure Laterality Date  . Cholecystectomy  1980  . Jaw bone surgury      53 yo  . Tonsillectomy    . Breath tek h pylori N/A 05/05/2013    Procedure: BREATH TEK H PYLORI;  Surgeon: Shann Medal, MD;  Location: [facility];  Service: General;  Laterality: N/A;  . Abdominal hysterectomy  2004    with BSO  . 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 She has never used smokeless tobacco. She reports that she does not drink alcohol or use illicit drugs. family history includes Arthritis in her other; Asthma in her brother; Breast cancer in her maternal aunt; Cancer in her mother; Colon polyps (age of onset: 44) in her father; Diabetes in her mother and paternal grandmother; Heart disease in her paternal grandmother; Hypertension in her mother; Lymphoma in her maternal grandmother; Osteoporosis in her other; Stroke in her mother; Thyroid disease in her maternal grandmother and mother. There is no history of Colon cancer. Allergies  Allergen Reactions  . Codeine Nausea Only   Current Outpatient Prescriptions on File Prior to Visit  Medication Sig Dispense Refill  . ALPRAZolam (XANAX) 0.25 MG tablet TAKE 1 TABLET BY MOUTH 3 TIMES DAILY AS NEEDED FOR ANXIETY 90 tablet 1  . levothyroxine (SYNTHROID, LEVOTHROID) 88 MCG tablet TAKE 1 TABLET BY MOUTH EVERY MORNING 30 tablet 2  . omeprazole (PRILOSEC) 20 MG capsule Take 2 capsules (40 mg total) by mouth daily. 90 capsule 3  . ondansetron (ZOFRAN-ODT) 4 MG disintegrating tablet DISSOLVE 1 TABLET ON TONGUE EVERY 8 HOURS AS NEEDED FOR NAUSEA OR VOMITING. 25 tablet 3  . traMADol (ULTRAM) 50 MG tablet TAKE ONE TABLET BY MOUTH EVERY SIX HOURS AS NEEDED 60 tablet 2  No current facility-administered medications on file prior to visit.    Review of Systems  Constitutional: Negative for unusual diaphoresis or other sweats  HENT: Negative for ringing in ear Eyes: Negative for double vision or worsening visual disturbance.  Respiratory: Negative for choking and stridor.   Gastrointestinal: Negative for vomiting or other signifcant bowel change Genitourinary: Negative for hematuria or decreased urine volume.  Musculoskeletal: Negative for  other MSK pain or swelling Skin: Negative for color change and worsening wound.  Neurological: Negative for tremors and numbness other than noted  Psychiatric/Behavioral: Negative for decreased concentration or agitation other than above       Objective:   Physical Exam BP 110/78 mmHg  Pulse 82  Temp(Src) 98.5 F (36.9 C) (Oral)  Ht '5\' 3"'  (1.6 m)  Wt 158 lb 8 oz (71.895 kg)  BMI 28.08 kg/m2  SpO2 97% VS noted, mild ill Constitutional: Pt appears well-developed, well-nourished.  HENT: Head: NCAT.  Right Ear: External ear normal.  Left Ear: External ear normal.  Eyes: . Pupils are equal, round, and reactive to light. Conjunctivae and EOM are normal Neck: Normal range of motion. Neck supple.  Bilat tm's with mild erythema.  Max sinus areas mod tender.  Pharynx with mild erythema, no exudate Cardiovascular: Normal rate and regular rhythm.   Pulmonary/Chest: Effort normal and breath sounds normal.  Neurological: Pt is alert. Not confused , motor grossly intact Skin: Skin is warm. No rash Psychiatric: Pt behavior is normal. No agitation.     Assessment & Plan:

## 2013-12-27 NOTE — Assessment & Plan Note (Signed)
Mild to mod, for flonase asd, otc allegra prn,  to f/u any worsening symptoms or concerns

## 2013-12-27 NOTE — Assessment & Plan Note (Signed)
Mild to mod, for antibx course,  to f/u any worsening symptoms or concerns 

## 2013-12-27 NOTE — Progress Notes (Signed)
Pre visit review using our clinic review tool, if applicable. No additional management support is needed unless otherwise documented below in the visit note. 

## 2013-12-27 NOTE — Assessment & Plan Note (Signed)
stable overall by history and exam, recent data reviewed with pt, and pt to continue medical treatment as before,  to f/u any worsening symptoms or concerns BP Readings from Last 3 Encounters:  12/27/13 110/78  09/26/13 112/72  09/14/13 138/68

## 2013-12-27 NOTE — Assessment & Plan Note (Signed)
Improved by report, will ask for labs to be forwarded

## 2013-12-27 NOTE — Patient Instructions (Signed)
Please take all new medication as prescribed - the antibiotic, flonase, and diflucan if needed  Please continue all other medications as before, including the xanax  Please have the pharmacy call with any other refills you may need.  Please continue your efforts at being more active, low cholesterol diet, and weight control.  We will call for your recent lab results.  Please keep your appointments with your specialists as you may have planned  Please return in 1 year for your yearly visit, or sooner if needed, with Lab testing done 3-5 days before

## 2014-01-11 ENCOUNTER — Other Ambulatory Visit (INDEPENDENT_AMBULATORY_CARE_PROVIDER_SITE_OTHER): Payer: Self-pay

## 2014-01-11 DIAGNOSIS — Z9884 Bariatric surgery status: Secondary | ICD-10-CM

## 2014-01-11 DIAGNOSIS — K909 Intestinal malabsorption, unspecified: Secondary | ICD-10-CM

## 2014-01-11 DIAGNOSIS — Z0000 Encounter for general adult medical examination without abnormal findings: Secondary | ICD-10-CM

## 2014-02-07 ENCOUNTER — Other Ambulatory Visit: Payer: Self-pay | Admitting: Internal Medicine

## 2014-02-08 NOTE — Telephone Encounter (Signed)
Faxed hardcopy for Tramadol to [facility] 

## 2014-02-08 NOTE — Telephone Encounter (Signed)
Done hardcopy to [facility]  

## 2014-02-20 ENCOUNTER — Ambulatory Visit: Payer: Managed Care, Other (non HMO) | Admitting: Dietician

## 2014-02-20 ENCOUNTER — Encounter: Payer: Managed Care, Other (non HMO) | Attending: Surgery | Admitting: Dietician

## 2014-02-20 DIAGNOSIS — Z713 Dietary counseling and surveillance: Secondary | ICD-10-CM | POA: Diagnosis not present

## 2014-02-20 DIAGNOSIS — Z6828 Body mass index (BMI) 28.0-28.9, adult: Secondary | ICD-10-CM | POA: Insufficient documentation

## 2014-02-20 NOTE — Progress Notes (Signed)
  Follow-up visit:  5 months Post-Operative RYGB Surgery  Appt start time: 400 end time: 430   Primary concerns today: Post-operative Bariatric Surgery Nutrition Management.  Brycelyn returns having reached her weight loss goal. She reports she is still having trouble tolerating beef and pork. Her HTN, hyperlipidemia, and OSA have resolved. She continues to eat every few hours. She does not feel hungry as she is under severe family stress.  Surgery date: 09/12/2013 Surgery type: RYGB Start weight at [facility]: 203 lbs on 08/08/2013 (211 lbs per patient) Weight today: 146.5 lbs Weight change: 15 lbs Total weight lost: 65 lbs Goal weight: 135-140 lbs  TANITA  BODY COMP RESULTS  08/28/13 09/25/13 11/06/13 12/20/13 02/20/14   BMI (kg/m^2) 36.1 33.6 29.9 28.5 26   Fat Mass (lbs) 95.0 83.5 68.5 59.5 48.5   Fat Free Mass (lbs) 108.5 106 100.5 101.5 98   Total Body Water (lbs) 79.5 77.5 73.5 74.5 71.5    Preferred Learning Style:   No preference indicated   Learning Readiness:   Ready  Dietary recall: Levita reports having something to eat every few hours. B: cottage cheese with fruit, coffee, 4oz orange juice Cream soups, string cheese, beans deli [place] with cottage cheese, tuna with low fat mayonnaise, 1-2 oz [place], pork, chicken, or salmon with green beans, cooked vegetables  Having 1-2 Premier protein shakes per day.  Fluid intake: water, decaf coffee, skim milk, Premier protein shakes (11oz), sugar free jello Estimated total protein intake: 60+ grams per day  Medications: no longer on BP meds and bladder meds Supplementation: taking  Using straws: no Drinking while eating: no Hair loss: none Carbonated beverages: no N/V/D/C: nausea Dumping syndrome: "dumping sensation" with steak  Recent physical activity:  Elliptical 2-3 days a week  Progress Towards Goal(s):  In progress.    Nutritional Diagnosis:  [place]-3.3 Overweight/obesity related to past poor dietary habits and  physical inactivity as evidenced by patient w/ recent RYGB surgery following dietary guidelines for continued weight loss.     Intervention:  Nutrition counseling provided.   Teaching Method Utilized:  Visual Auditory  Barriers to learning/adherence to lifestyle change: family stress  Demonstrated degree of understanding via:  Teach Back   Monitoring/Evaluation:  Dietary intake, exercise, and body weight. Follow up in 3 months for 8 month post-op visit.

## 2014-02-20 NOTE — Patient Instructions (Addendum)
TANITA  BODY COMP RESULTS  08/28/13 09/25/13 11/06/13 12/20/13 02/20/14   BMI (kg/m^2) 36.1 33.6 29.9 28.5 26   Fat Mass (lbs) 95.0 83.5 68.5 59.5 48.5   Fat Free Mass (lbs) 108.5 106 100.5 101.5 98   Total Body Water (lbs) 79.5 77.5 73.5 74.5 71.5

## 2014-03-15 ENCOUNTER — Other Ambulatory Visit: Payer: Self-pay | Admitting: Internal Medicine

## 2014-03-15 NOTE — Telephone Encounter (Signed)
Pt called in said that pharmacy would only give her 30 pills for some reason.  She normally gets 90 of the xanex. She called in wondering why they will only give her 17?

## 2014-03-15 NOTE — Telephone Encounter (Signed)
Faxed script back to [place],.,,,/lmb

## 2014-03-15 NOTE — Telephone Encounter (Signed)
Done hardcopy to rachel  

## 2014-03-30 ENCOUNTER — Other Ambulatory Visit: Payer: Self-pay

## 2014-03-30 DIAGNOSIS — Z1231 Encounter for screening mammogram for malignant neoplasm of breast: Secondary | ICD-10-CM

## 2014-04-13 ENCOUNTER — Other Ambulatory Visit: Payer: Self-pay | Admitting: Women's Health

## 2014-05-02 ENCOUNTER — Other Ambulatory Visit: Payer: Self-pay | Admitting: Internal Medicine

## 2014-05-09 ENCOUNTER — Telehealth: Payer: Self-pay | Admitting: Internal Medicine

## 2014-05-09 ENCOUNTER — Emergency Department (HOSPITAL_COMMUNITY)
Admission: EM | Admit: 2014-05-09 | Discharge: 2014-05-09 | Disposition: A | Discharge status: Home / Self Care | Payer: Managed Care, Other (non HMO) | Attending: Emergency Medicine | Admitting: Emergency Medicine

## 2014-05-09 ENCOUNTER — Encounter (HOSPITAL_COMMUNITY): Payer: Self-pay

## 2014-05-09 ENCOUNTER — Emergency Department (HOSPITAL_COMMUNITY): Payer: Managed Care, Other (non HMO)

## 2014-05-09 DIAGNOSIS — M542 Cervicalgia: Secondary | ICD-10-CM | POA: Insufficient documentation

## 2014-05-09 DIAGNOSIS — Z792 Long term (current) use of antibiotics: Secondary | ICD-10-CM | POA: Diagnosis not present

## 2014-05-09 DIAGNOSIS — F419 Anxiety disorder, unspecified: Secondary | ICD-10-CM | POA: Insufficient documentation

## 2014-05-09 DIAGNOSIS — Z79899 Other long term (current) drug therapy: Secondary | ICD-10-CM | POA: Insufficient documentation

## 2014-05-09 DIAGNOSIS — Z7951 Long term (current) use of inhaled steroids: Secondary | ICD-10-CM | POA: Insufficient documentation

## 2014-05-09 DIAGNOSIS — F329 Major depressive disorder, single episode, unspecified: Secondary | ICD-10-CM | POA: Diagnosis not present

## 2014-05-09 DIAGNOSIS — E039 Hypothyroidism, unspecified: Secondary | ICD-10-CM | POA: Diagnosis not present

## 2014-05-09 DIAGNOSIS — Z8669 Personal history of other diseases of the nervous system and sense organs: Secondary | ICD-10-CM | POA: Diagnosis not present

## 2014-05-09 DIAGNOSIS — K219 Gastro-esophageal reflux disease without esophagitis: Secondary | ICD-10-CM | POA: Insufficient documentation

## 2014-05-09 DIAGNOSIS — R51 Headache: Secondary | ICD-10-CM | POA: Insufficient documentation

## 2014-05-09 DIAGNOSIS — I10 Essential (primary) hypertension: Secondary | ICD-10-CM | POA: Insufficient documentation

## 2014-05-09 DIAGNOSIS — R519 Headache, unspecified: Secondary | ICD-10-CM

## 2014-05-09 DIAGNOSIS — Z8742 Personal history of other diseases of the female genital tract: Secondary | ICD-10-CM | POA: Insufficient documentation

## 2014-05-09 LAB — CBC
HCT: 39.8 % (ref 36.0–46.0)
Hemoglobin: 13 g/dL (ref 12.0–15.0)
MCH: 29.6 pg (ref 26.0–34.0)
MCHC: 32.7 g/dL (ref 30.0–36.0)
MCV: 90.7 fL (ref 78.0–100.0)
Platelets: 286 10*3/uL (ref 150–400)
RBC: 4.39 MIL/uL (ref 3.87–5.11)
RDW: 12.4 % (ref 11.5–15.5)
WBC: 5.6 10*3/uL (ref 4.0–10.5)

## 2014-05-09 LAB — TROPONIN I: Troponin I: <0.03 ng/mL (ref <0.031)

## 2014-05-09 LAB — BASIC METABOLIC PANEL
Anion gap: 8 (ref 5–15)
BUN: 9 mg/dL (ref 6–23)
CO2: 28 mmol/L (ref 19–32)
Calcium: 9.3 mg/dL (ref 8.4–10.5)
Chloride: 102 mmol/L (ref 96–112)
Creatinine, Ser: 0.57 mg/dL (ref 0.50–1.10)
GFR calc Af Amer: >90 mL/min (ref >90)
GFR calc non Af Amer: >90 mL/min (ref >90)
Glucose, Bld: 89 mg/dL (ref 70–99)
Potassium: 3.6 mmol/L (ref 3.5–5.1)
Sodium: 138 mmol/L (ref 135–145)

## 2014-05-09 MED ORDER — DIPHENHYDRAMINE HCL 50 MG/ML IJ SOLN
25.0000 mg | Freq: Once | INTRAMUSCULAR | Status: AC
  Administered 2014-05-09: 25 mg via INTRAVENOUS
  Filled 2014-05-09: qty 1

## 2014-05-09 MED ORDER — METOCLOPRAMIDE HCL 5 MG/ML IJ SOLN
10.0000 mg | Freq: Once | INTRAMUSCULAR | Status: AC
  Administered 2014-05-09: 10 mg via INTRAVENOUS
  Filled 2014-05-09: qty 2

## 2014-05-09 MED ORDER — SODIUM CHLORIDE 0.9 % IV BOLUS (SEPSIS)
500.0000 mL | Freq: Once | INTRAVENOUS | Status: AC
  Administered 2014-05-09: 500 mL via INTRAVENOUS

## 2014-05-09 MED ORDER — HYDROCODONE-ACETAMINOPHEN 5-325 MG PO TABS: 1.0000 | ORAL_TABLET | ORAL | Status: DC | PRN

## 2014-05-09 MED ORDER — KETOROLAC TROMETHAMINE 30 MG/ML IJ SOLN
30.0000 mg | Freq: Once | INTRAMUSCULAR | Status: AC
  Administered 2014-05-09: 30 mg via INTRAVENOUS
  Filled 2014-05-09: qty 1

## 2014-05-09 NOTE — ED Notes (Addendum)
Pt woke up this morning with a left frontal headache.  Pt states pain went to jaw.  Thought it might be a toothache.  No difficulty chewing.  Then pt started having tingling in left fingertips with nausea.  Pt told she had "slight MI" in 2013.  Pt denies chest pain.

## 2014-05-09 NOTE — ED Notes (Signed)
Pt alert and oriented x4. Respirations even and unlabored, bilateral symmetrical rise and fall of chest. Skin warm and dry. In no acute distress. Denies needs.   

## 2014-05-09 NOTE — Telephone Encounter (Signed)
[facility]     Patient Name: LEELOO SILVERTHORNE Initial Comment caller states she has a headache and numbness on left side of her face and neck  DOB: 08-14-1960      Nurse Assessment  Nurse: Mechele Dawley, RN, Amy Date/Time Eilene Ghazi Time): 05/09/2014 2:38:18 PM  Confirm and document reason for call. If symptomatic, describe symptoms. ---CALLER STATES THAT SHE STARTED OUT WITH A HEADACHE ON THE LEFT SIDE OF HER FACE. SHE IS HAVING SOME NUMBNESS ON THE LEFT SIDE OF HER FACE AND NECK. SHE STATES IT HAS GRADUALLY GOTTEN WORSE. SHE IS HAVING SOME NAUSEA. SHE USED TO HAVE MIGRAINES BEFORE SHE HAD A HYSTERECTOMY IN 2002 AND NONE SINCE. SHE IS FINDING IT HARD TO TALK, SHE IS HAVING A HARD TIME TALKING. IT IS INTO THE NECK ONLY.  Has the patient traveled out of the country within the last 30 days? ---Not Applicable  Does the patient require triage? ---Yes  Related visit to physician within the last 2 weeks? ---No  Does the PT have any chronic conditions? (i.e. diabetes, asthma, etc.) ---No  Did the patient indicate they were pregnant? ---No    Guidelines     Guideline Title Affirmed Question Affirmed Notes   Neurologic Deficit Headache (and neurologic deficit)    Final Disposition User   Go to ED Now [place], Therapist, sports, Amy

## 2014-05-09 NOTE — Discharge Instructions (Signed)
Take Vicodin for severe pain only. No driving or operating heavy machinery while taking vicodin. This medication may cause drowsiness. Trigeminal Neuralgia Trigeminal neuralgia is a nerve disorder that causes sudden attacks of severe facial pain. It is caused by damage to the trigeminal nerve, a major nerve in the face. It is more common in women and in the elderly, although it can also happen in younger patients. Attacks last from a few seconds to several minutes and can occur from a couple of times per year to several times per day. Trigeminal neuralgia can be a very distressing and disabling condition. Surgery may be needed in very severe cases if medical treatment does not give relief. HOME CARE INSTRUCTIONS   If your caregiver prescribed medication to help prevent attacks, take as directed.  To help prevent attacks:  Chew on the unaffected side of the mouth.  Avoid touching your face.  Avoid blasts of hot or cold air.  Men may wish to grow a beard to avoid having to shave. SEEK IMMEDIATE MEDICAL CARE IF:  Pain is unbearable and your medicine does not help.  You develop new, unexplained symptoms (problems).  You have problems that may be related to a medication you are taking. Document Released: 01/10/2000 Document Revised: 04/06/2011 Document Reviewed: 11/09/2008 [facility] Patient Information 2015 [place], [place]. This information is not intended to replace advice given to you by your health care provider. Make sure you discuss any questions you have with your health care provider.  Headaches, Frequently Asked Questions MIGRAINE HEADACHES Q: What is migraine? What causes it? How can I treat it? A: Generally, migraine headaches begin as a dull ache. Then they develop into a constant, throbbing, and pulsating pain. You may experience pain at the temples. You may experience pain at the front or back of one or both sides of the head. The pain is usually accompanied by a combination  of:  Nausea.  Vomiting.  Sensitivity to light and noise. Some people (about 15%) experience an aura (see below) before an attack. The cause of migraine is believed to be chemical reactions in the brain. Treatment for migraine may include over-the-counter or prescription medications. It may also include self-help techniques. These include relaxation training and biofeedback.  Q: What is an aura? A: About 15% of people with migraine get an "aura". This is a sign of neurological symptoms that occur before a migraine headache. You may see wavy or jagged lines, dots, or flashing lights. You might experience tunnel vision or blind spots in one or both eyes. The aura can include visual or auditory hallucinations (something imagined). It may include disruptions in smell (such as strange odors), taste or touch. Other symptoms include:  Numbness.  A "pins and needles" sensation.  Difficulty in recalling or speaking the correct word. These neurological events may last as long as 60 minutes. These symptoms will fade as the headache begins. Q: What is a trigger? A: Certain physical or environmental factors can lead to or "trigger" a migraine. These include:  Foods.  Hormonal changes.  Weather.  Stress. It is important to remember that triggers are different for everyone. To help prevent migraine attacks, you need to figure out which triggers affect you. Keep a headache diary. This is a good way to track triggers. The diary will help you talk to your healthcare professional about your condition. Q: Does weather affect migraines? A: Bright sunshine, hot, humid conditions, and drastic changes in barometric pressure may lead to, or "trigger," a migraine  attack in some people. But studies have shown that weather does not act as a trigger for everyone with migraines. Q: What is the link between migraine and hormones? A: Hormones start and regulate many of your body's functions. Hormones keep your body in  balance within a constantly changing environment. The levels of hormones in your body are unbalanced at times. Examples are during menstruation, pregnancy, or menopause. That can lead to a migraine attack. In fact, about three quarters of all women with migraine report that their attacks are related to the menstrual cycle.  Q: Is there an increased risk of stroke for migraine sufferers? A: The likelihood of a migraine attack causing a stroke is very remote. That is not to say that migraine sufferers cannot have a stroke associated with their migraines. In persons under age 34, the most common associated factor for stroke is migraine headache. But over the course of a person's normal life span, the occurrence of migraine headache may actually be associated with a reduced risk of dying from cerebrovascular disease due to stroke.  Q: What are acute medications for migraine? A: Acute medications are used to treat the pain of the headache after it has started. Examples over-the-counter medications, NSAIDs, ergots, and triptans.  Q: What are the triptans? A: Triptans are the newest class of abortive medications. They are specifically targeted to treat migraine. Triptans are vasoconstrictors. They moderate some chemical reactions in the brain. The triptans work on receptors in your brain. Triptans help to restore the balance of a neurotransmitter called serotonin. Fluctuations in levels of serotonin are thought to be a main cause of migraine.  Q: Are over-the-counter medications for migraine effective? A: Over-the-counter, or "OTC," medications may be effective in relieving mild to moderate pain and associated symptoms of migraine. But you should see your caregiver before beginning any treatment regimen for migraine.  Q: What are preventive medications for migraine? A: Preventive medications for migraine are sometimes referred to as "prophylactic" treatments. They are used to reduce the frequency, severity, and  length of migraine attacks. Examples of preventive medications include antiepileptic medications, antidepressants, beta-blockers, calcium channel blockers, and NSAIDs (nonsteroidal anti-inflammatory drugs). Q: Why are anticonvulsants used to treat migraine? A: During the past few years, there has been an increased interest in antiepileptic drugs for the prevention of migraine. They are sometimes referred to as "anticonvulsants". Both epilepsy and migraine may be caused by similar reactions in the brain.  Q: Why are antidepressants used to treat migraine? A: Antidepressants are typically used to treat people with depression. They may reduce migraine frequency by regulating chemical levels, such as serotonin, in the brain.  Q: What alternative therapies are used to treat migraine? A: The term "alternative therapies" is often used to describe treatments considered outside the scope of conventional Western medicine. Examples of alternative therapy include acupuncture, acupressure, and yoga. Another common alternative treatment is herbal therapy. Some herbs are believed to relieve headache pain. Always discuss alternative therapies with your caregiver before proceeding. Some herbal products contain arsenic and other toxins. TENSION HEADACHES Q: What is a tension-type headache? What causes it? How can I treat it? A: Tension-type headaches occur randomly. They are often the result of temporary stress, anxiety, fatigue, or anger. Symptoms include soreness in your temples, a tightening band-like sensation around your head (a "vice-like" ache). Symptoms can also include a pulling feeling, pressure sensations, and contracting head and neck muscles. The headache begins in your forehead, temples, or the back of your head  and neck. Treatment for tension-type headache may include over-the-counter or prescription medications. Treatment may also include self-help techniques such as relaxation training and  biofeedback. CLUSTER HEADACHES Q: What is a cluster headache? What causes it? How can I treat it? A: Cluster headache gets its name because the attacks come in groups. The pain arrives with little, if any, warning. It is usually on one side of the head. A tearing or bloodshot eye and a runny nose on the same side of the headache may also accompany the pain. Cluster headaches are believed to be caused by chemical reactions in the brain. They have been described as the most severe and intense of any headache type. Treatment for cluster headache includes prescription medication and oxygen. SINUS HEADACHES Q: What is a sinus headache? What causes it? How can I treat it? A: When a cavity in the bones of the face and skull (a sinus) becomes inflamed, the inflammation will cause localized pain. This condition is usually the result of an allergic reaction, a tumor, or an infection. If your headache is caused by a sinus blockage, such as an infection, you will probably have a fever. An x-ray will confirm a sinus blockage. Your caregiver's treatment might include antibiotics for the infection, as well as antihistamines or decongestants.  REBOUND HEADACHES Q: What is a rebound headache? What causes it? How can I treat it? A: A pattern of taking acute headache medications too often can lead to a condition known as "rebound headache." A pattern of taking too much headache medication includes taking it more than 2 days per week or in excessive amounts. That means more than the label or a caregiver advises. With rebound headaches, your medications not only stop relieving pain, they actually begin to cause headaches. Doctors treat rebound headache by tapering the medication that is being overused. Sometimes your caregiver will gradually substitute a different type of treatment or medication. Stopping may be a challenge. Regularly overusing a medication increases the potential for serious side effects. Consult a caregiver  if you regularly use headache medications more than 2 days per week or more than the label advises. ADDITIONAL QUESTIONS AND ANSWERS Q: What is biofeedback? A: Biofeedback is a self-help treatment. Biofeedback uses special equipment to monitor your body's involuntary physical responses. Biofeedback monitors:  Breathing.  Pulse.  Heart rate.  Temperature.  Muscle tension.  Brain activity. Biofeedback helps you refine and perfect your relaxation exercises. You learn to control the physical responses that are related to stress. Once the technique has been mastered, you do not need the equipment any more. Q: Are headaches hereditary? A: Four out of five (80%) of people that suffer report a family history of migraine. Scientists are not sure if this is genetic or a family predisposition. Despite the uncertainty, a child has a 50% chance of having migraine if one parent suffers. The child has a 75% chance if both parents suffer.  Q: Can children get headaches? A: By the time they reach high school, most young people have experienced some type of headache. Many safe and effective approaches or medications can prevent a headache from occurring or stop it after it has begun.  Q: What type of doctor should I see to diagnose and treat my headache? A: Start with your primary caregiver. Discuss his or her experience and approach to headaches. Discuss methods of classification, diagnosis, and treatment. Your caregiver may decide to recommend you to a headache specialist, depending upon your symptoms or other physical  conditions. Having diabetes, allergies, etc., may require a more comprehensive and inclusive approach to your headache. The National Headache Foundation will provide, upon request, a list of [facility] physician members in your state. Document Released: 04/04/2003 Document Revised: 04/06/2011 Document Reviewed: 09/12/2007 [facility] Patient Information 2015 [place], [place]. This information is not  intended to replace advice given to you by your health care provider. Make sure you discuss any questions you have with your health care provider.

## 2014-05-09 NOTE — ED Provider Notes (Signed)
CSN: [id number]     Arrival date & time 05/09/14  1524 History   First MD Initiated Contact with Patient 05/09/14 1623     Chief Complaint  Patient presents with  . Neck Pain  . Jaw Pain     (Consider location/radiation/quality/duration/timing/severity/associated sxs/prior Treatment) HPI Comments: 54 year old female presents with left sided headache that was present when she woke up this morning. Headache began in the left frontal area and has radiated down the left side of her face to her left jaw, causing pain into her teeth. Took 2 Tylenol and hydrocodone this morning, unknown doses, at 1330 with no relief. Pain is constant at a 5/10. Also experiencing nausea, dizziness, and some tinging in her fingertips of her left hand. Has not had a headache like this in the past and has not fallen or hit her head. Denies recent illness, history of stroke, vomiting, fever, chills, difficulty breathing, shortness of breath, chest pains and weakness. Had bariatric surgery is August 2015 and has not taken blood pressure or cholesterol medications since that time. No recent dental work. States her PCP saw some EKG changes in 2013 and told her she had a "mild heart attack" at that time, but she did not have surgery or hospitalization.   Patient is a 54 y.o. female presenting with neck pain. The history is provided by the patient.  Neck Pain Associated symptoms: headaches     Past Medical History  Diagnosis Date  . ANXIETY 03/30/2007  . BACK PAIN 02/20/2009  . DEPRESSION 03/30/2007  . GERD 01/13/2007  . HYPERLIPIDEMIA 02/22/2008  . MENOPAUSAL DISORDER 02/23/2008  . SINUSITIS- ACUTE-NOS 03/30/2007  . THORACIC/LUMBOSACRAL NEURITIS/RADICULITIS UNSPEC 02/23/2008  . Wheezing 01/08/2009  . HYPOTHYROIDISM 03/30/2007  . Essential hypertension, benign 05/31/2012  . Family history of anesthesia complication     "mother has hard time waking up"  . OBSTRUCTIVE SLEEP APNEA 06/29/2007    has not used c pap x 8 months due to wt  loss  . H/O hiatal hernia   . Nocturia    Past Surgical History  Procedure Laterality Date  . Cholecystectomy  1980  . Jaw bone surgury      54 yo  . Tonsillectomy    . Breath tek h pylori N/A 05/05/2013    Procedure: BREATH TEK H PYLORI;  Surgeon: Shann Medal, MD;  Location: [facility];  Service: General;  Laterality: N/A;  . Abdominal hysterectomy  2004    with BSO  . 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 Stroke Mother   . Hypertension Mother   . Diabetes Mother   . Cancer Mother     basel cell cancer  . Thyroid disease Mother   . Asthma Brother   . Thyroid disease Maternal Grandmother   . Lymphoma Maternal Grandmother   . Osteoporosis Other   . Arthritis Other   . Colon cancer Neg Hx   . Colon polyps Father 56    "CANCEROUS POLYPS REMOVED THIS YEAR"  . Breast cancer Maternal Aunt     mat great aunt  . Diabetes Paternal Grandmother   . Heart disease Paternal Grandmother    History  Substance Use Topics  . Smoking status: Never Smoker   . Smokeless tobacco: Never Used  .  Alcohol Use: No     Comment: SOCAILLY,MAYBE MONTHLY WINE   OB History    Gravida Para Term Preterm AB TAB SAB Ectopic Multiple Living   2 2   0     2     Review of Systems  Neurological: Positive for headaches.  All other systems reviewed and are negative.     Allergies  Codeine  Home Medications   Prior to Admission medications   Medication Sig Start Date End Date Taking? Authorizing Provider  acetaminophen (TYLENOL) 325 MG tablet Take 650 mg by mouth every 6 (six) hours as needed for mild pain or headache.   Yes Historical Provider, MD  ALPRAZolam (XANAX) 0.25 MG tablet TAKE 1 TABLET BY MOUTH 3 TIMES DAILY AS NEEDED FOR ANXIETY Patient taking  differently: TAKE 0.5 TABLET BY MOUTH TWICE DAILY 03/15/14  Yes Biagio Borg, MD  CALCIUM PO Take 5 mLs by mouth daily.   Yes Historical Provider, MD  Cyanocobalamin (B-12 PO) Take 5 mLs by mouth as needed (low b12).   Yes Historical Provider, MD  HYDROcodone-acetaminophen (NORCO/VICODIN) 5-325 MG per tablet Take 1 tablet by mouth every 6 (six) hours as needed for moderate pain or severe pain.   Yes Historical Provider, MD  levothyroxine (SYNTHROID, LEVOTHROID) 88 MCG tablet TAKE 1 TABLET BY MOUTH EVERY MORNING 04/13/14  Yes Huel Cote, NP  Multiple Vitamins-Minerals (MULTIVITAMIN ADULT PO) Take 1 tablet by mouth daily.   Yes Historical Provider, MD  omeprazole (PRILOSEC) 20 MG capsule TAKE 2 CAPSULES BY MOUTH DAILY Patient taking differently: TAKE 1 CAPSULE BY MOUTH DAILY 05/02/14  Yes Janith Lima, MD  fluconazole (DIFLUCAN) 150 MG tablet 1 tab by mouth every 3 days as needed Patient not taking: Reported on 02/20/2014 12/27/13   Biagio Borg, MD  fluticasone [facility]) 50 MCG/ACT nasal spray Place 2 sprays into both nostrils daily. Patient not taking: Reported on 02/20/2014 12/27/13   Biagio Borg, MD  HYDROcodone-acetaminophen (NORCO/VICODIN) 5-325 MG per tablet Take 1-2 tablets by mouth every 4 (four) hours as needed. 05/09/14   Yezenia Fredrick M Indya Oliveria, PA-C  levofloxacin (LEVAQUIN) 250 MG tablet Take 1 tablet (250 mg total) by mouth daily. Patient not taking: Reported on 02/20/2014 12/27/13   Biagio Borg, MD  ondansetron (ZOFRAN-ODT) 4 MG disintegrating tablet DISSOLVE 1 TABLET ON TONGUE EVERY 8 HOURS AS NEEDED FOR NAUSEA OR VOMITING. Patient not taking: Reported on 02/20/2014 10/12/13   Biagio Borg, MD  traMADol (ULTRAM) 50 MG tablet TAKE 1 TABLET BY MOUTH EVERY 6 HOURS AS NEEDED Patient not taking: Reported on 02/20/2014 02/08/14   Biagio Borg, MD   BP 110/65 mmHg  Pulse 48  Temp(Src) 98.2 F (36.8 C) (Oral)  Resp 16  Ht 5' 2.5" (1.588 m)  Wt 135 lb (61.236 kg)  BMI 24.28 kg/m2  SpO2 100% Physical  Exam  Constitutional: She is oriented to person, place, and time. She appears well-developed and well-nourished. No distress.  HENT:  Head: Normocephalic and atraumatic.    Mouth/Throat: Uvula is midline and oropharynx is clear and moist. Normal dentition.  No temporal artery tenderness.  Eyes: Conjunctivae and EOM are normal. Pupils are equal, round, and reactive to light.  Neck: Normal range of motion. Neck supple.  No meningeal signs.  Cardiovascular: Normal rate, regular rhythm, normal heart sounds and intact distal pulses.   Pulmonary/Chest: Effort normal and breath sounds normal. No respiratory distress.  Abdominal: Soft. Bowel sounds are normal. There is no tenderness.  Musculoskeletal: Normal range of motion. She exhibits no edema.  Neurological: She is alert and oriented to person, place, and time. She has normal strength. No cranial nerve deficit or sensory deficit. Coordination and gait normal.  Speech fluent, goal oriented. Equal grip strength bilateral. Moves limbs without ataxia.  Skin: Skin is warm and dry. No rash noted. She is not diaphoretic.  Psychiatric: She has a normal mood and affect. Her behavior is normal.  Nursing note and vitals reviewed.   ED Course  Procedures (including critical care time) Labs Review Labs Reviewed  CBC  BASIC METABOLIC PANEL  TROPONIN I    Imaging Review Dg Chest 2 View  05/09/2014   CLINICAL DATA:  Nausea and pain in mandible region  EXAM: CHEST  2 VIEW  COMPARISON:  September 01, 2013  FINDINGS: Lungs are clear. Heart size and pulmonary vascularity are normal. No adenopathy. No pneumothorax. No bone lesions.  IMPRESSION: No edema or consolidation.   Electronically Signed   By: Lowella Grip III M.D.   On: 05/09/2014 18:05   Ct Head Wo Contrast  05/09/2014   CLINICAL DATA:  Left frontal headache for 1 day. Tingling in fingers of left hand  EXAM: CT HEAD WITHOUT CONTRAST  TECHNIQUE: Contiguous axial images were obtained from the  base of the skull through the vertex without intravenous contrast.  COMPARISON:  None.  FINDINGS: The ventricles are normal in size and configuration. There is no mass, hemorrhage, extra-axial fluid collection, or midline shift. Gray-white compartments are normal. No acute infarct apparent. The bony calvarium appears intact. The mastoid air cells are clear. There is postoperative change in the mid face region.  IMPRESSION: No intracranial mass, hemorrhage, or acute appearing infarct. Gray-white compartments appear normal. Postoperative change mid face.   Electronically Signed   By: Lowella Grip III M.D.   On: 05/09/2014 18:52     EKG Interpretation   Date/Time:  Wednesday May 09 2014 15:33:28 EDT Ventricular Rate:  56 PR Interval:  57 QRS Duration: 99 QT Interval:  456 QTC Calculation: 440 R Axis:   -68 Text Interpretation:  Sinus rhythm Short PR interval Left anterior  fascicular block Abnormal R-wave progression, late transition Baseline  wander in lead(s) V2 V4 No significant change since last tracing Confirmed  by HARRISON  MD, FORREST ([id number]) on 05/09/2014 5:08:45 PM      MDM   Final diagnoses:  Left-sided headache   Nontoxic appearing, NAD. AF VSS. No focal neurologic deficits. No thunder clap appearance of headache. Pain 5/10. Low suspicion for [facility]. Given that this is a headache that the patient has never had before, head CT obtained. Head CT negative for any acute findings. Symptoms seem consistent with trigeminal neuralgia. Pain reproducible on exam. Workup negative. Symptoms significantly resolved with Toradol, Reglan and Benadryl. Doubt CVA/TIA/meningitis. Stable for discharge. Advised PCP follow-up within the next 2-3 days. Return precautions given. Patient states understanding of treatment care plan and is agreeable.  Carman Ching, PA-C 05/10/14 0009  Pamella Pert, MD 05/10/14 [phone]

## 2014-05-09 NOTE — ED Notes (Signed)
Pt made aware no pain meds till CT results come back.

## 2014-05-09 NOTE — ED Notes (Signed)
Pt to CT

## 2014-05-10 ENCOUNTER — Encounter: Payer: Self-pay | Admitting: Internal Medicine

## 2014-05-10 ENCOUNTER — Ambulatory Visit (INDEPENDENT_AMBULATORY_CARE_PROVIDER_SITE_OTHER): Payer: Managed Care, Other (non HMO) | Admitting: Internal Medicine

## 2014-05-10 VITALS — BP 116/78 | HR 48 | Temp 98.4°F | Resp 18 | Ht 63.0 in | Wt 144.1 lb

## 2014-05-10 DIAGNOSIS — G500 Trigeminal neuralgia: Secondary | ICD-10-CM | POA: Insufficient documentation

## 2014-05-10 DIAGNOSIS — F329 Major depressive disorder, single episode, unspecified: Secondary | ICD-10-CM | POA: Diagnosis not present

## 2014-05-10 DIAGNOSIS — I10 Essential (primary) hypertension: Secondary | ICD-10-CM | POA: Diagnosis not present

## 2014-05-10 DIAGNOSIS — F32A Depression, unspecified: Secondary | ICD-10-CM

## 2014-05-10 MED ORDER — CARBAMAZEPINE 100 MG PO CHEW: 100.0000 mg | CHEWABLE_TABLET | Freq: Two times a day (BID) | ORAL | Status: DC

## 2014-05-10 NOTE — Assessment & Plan Note (Signed)
stable overall by history and exam, recent data reviewed with pt, and pt to continue medical treatment as before,  to f/u any worsening symptoms or concerns Lab Results  Component Value Date   WBC 5.6 05/09/2014   HGB 13.0 05/09/2014   HCT 39.8 05/09/2014   PLT 286 05/09/2014   GLUCOSE 89 05/09/2014   CHOL 224* 04/18/2013   TRIG 182.0* 04/18/2013   HDL 65.20 04/18/2013   LDLDIRECT 94.8 04/29/2012   LDLCALC 122* 04/18/2013   ALT 24 09/01/2013   AST 27 09/01/2013   NA 138 05/09/2014   K 3.6 05/09/2014   CL 102 05/09/2014   CREATININE 0.57 05/09/2014   BUN 9 05/09/2014   CO2 28 05/09/2014   TSH 0.941 12/08/2013

## 2014-05-10 NOTE — Assessment & Plan Note (Signed)
Severe last pm, better today, ok for hydrocodone any worsening pain, also to start tegretol low dose, warned of sedation, to consider increased dose for lack of efficacy, also refer NS, will hold on Head MRI at this time

## 2014-05-10 NOTE — Patient Instructions (Addendum)
Please take all new medication as prescribed - the carbamazepime (tegretol) at 100 mg twice per day (sent to [facility])  This medication can be increased to 200 mg twice per day if the lower dose is not effective.  Just watch for sleepiness with taking.  Please continue all other medications as before, including the pain medication if needed  Please have the pharmacy call with any other refills you may need.  You will be contacted regarding the referral for: neurosurgury  Please keep your appointments with your specialists as you may have planned

## 2014-05-10 NOTE — Progress Notes (Signed)
Subjective:    Patient ID: Kelli Bell, female    DOB: [birth year], 54 y.o.   MRN: [id number]  HPI  Here with acute onset severe  ? Lancinating type pain to left face - primarily temporal and maxillary areas, also some numbness to lower third left face and neck.  No other HA - + hx of migraine but none since BSO TAH 2002. But this pain was different  No fever, sinus congestion, earache, dental issues or mouth pain.  No prior hx of this type of pain.  Seen in ED - CT head neg, suggested had TN - tx with hydrocodone prn, pain better today so has not had med filled  Pt denies chest pain, increased sob or doe, wheezing, orthopnea, PND, increased LE swelling, palpitations, dizziness or syncope. Denies worsening depressive symptoms, suicidal ideation, or panic Past Medical History  Diagnosis Date  . ANXIETY 03/30/2007  . BACK PAIN 02/20/2009  . DEPRESSION 03/30/2007  . GERD 01/13/2007  . HYPERLIPIDEMIA 02/22/2008  . MENOPAUSAL DISORDER 02/23/2008  . SINUSITIS- ACUTE-NOS 03/30/2007  . THORACIC/LUMBOSACRAL NEURITIS/RADICULITIS UNSPEC 02/23/2008  . Wheezing 01/08/2009  . HYPOTHYROIDISM 03/30/2007  . Essential hypertension, benign 05/31/2012  . Family history of anesthesia complication     "mother has hard time waking up"  . OBSTRUCTIVE SLEEP APNEA 06/29/2007    has not used c pap x 8 months due to wt loss  . H/O hiatal hernia   . Nocturia    Past Surgical History  Procedure Laterality Date  . Cholecystectomy  1980  . Jaw bone surgury      53 yo  . Tonsillectomy    . Breath tek h pylori N/A 05/05/2013    Procedure: BREATH TEK H PYLORI;  Surgeon: Shann Medal, MD;  Location: [facility];  Service: General;  Laterality: N/A;  . Abdominal hysterectomy  2004    with BSO  . 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 She has never used smokeless tobacco. She reports that she does not drink alcohol or use illicit drugs. family history includes Arthritis in her other; Asthma in her brother; Breast cancer in her maternal aunt; Cancer in her mother; Colon polyps (age of onset: 36) in her father; Diabetes in her mother and paternal grandmother; Heart disease in her paternal grandmother; Hypertension in her mother; Lymphoma in her maternal grandmother; Osteoporosis in her other; Stroke in her mother; Thyroid disease in her maternal grandmother and mother. There is no history of Colon cancer. Allergies  Allergen Reactions  . Codeine Nausea Only   Current Outpatient Prescriptions on File Prior to Visit  Medication Sig Dispense Refill  . acetaminophen (TYLENOL) 325 MG tablet Take 650 mg by mouth every 6 (six) hours as needed for mild pain or headache.    . ALPRAZolam (XANAX) 0.25 MG tablet TAKE 1 TABLET BY MOUTH 3 TIMES DAILY AS NEEDED FOR ANXIETY (Patient taking differently: TAKE 0.5 TABLET BY MOUTH TWICE DAILY) 90 tablet 2  . CALCIUM PO Take 5 mLs by mouth daily.    . Cyanocobalamin (B-12 PO) Take 5 mLs by mouth as needed (low b12).    . fluconazole (DIFLUCAN) 150 MG tablet 1 tab by mouth every 3 days as needed 2 tablet 1  . fluticasone (FLONASE) 50 MCG/ACT nasal  spray Place 2 sprays into both nostrils daily. 16 g 2  . HYDROcodone-acetaminophen (NORCO/VICODIN) 5-325 MG per tablet Take 1 tablet by mouth every 6 (six) hours as needed for moderate pain or severe pain.    Marland Kitchen levofloxacin (LEVAQUIN) 250 MG tablet Take 1 tablet (250 mg total) by mouth daily. 10 tablet 0  . levothyroxine (SYNTHROID, LEVOTHROID) 88 MCG tablet TAKE 1 TABLET BY MOUTH EVERY MORNING 30 tablet 2  . Multiple Vitamins-Minerals (MULTIVITAMIN ADULT PO) Take 1 tablet by mouth daily.    Marland Kitchen omeprazole (PRILOSEC) 20 MG capsule TAKE 2 CAPSULES BY MOUTH DAILY (Patient taking differently: TAKE 1 CAPSULE BY MOUTH DAILY) 90 capsule 3    . ondansetron (ZOFRAN-ODT) 4 MG disintegrating tablet DISSOLVE 1 TABLET ON TONGUE EVERY 8 HOURS AS NEEDED FOR NAUSEA OR VOMITING. 25 tablet 3  . traMADol (ULTRAM) 50 MG tablet TAKE 1 TABLET BY MOUTH EVERY 6 HOURS AS NEEDED 60 tablet 2  . HYDROcodone-acetaminophen (NORCO/VICODIN) 5-325 MG per tablet Take 1-2 tablets by mouth every 4 (four) hours as needed. (Patient not taking: Reported on 05/10/2014) 10 tablet 0   No current facility-administered medications on file prior to visit.     Review of Systems  Constitutional: Negative for unusual diaphoresis or night sweats HENT: Negative for ringing in ear or discharge Eyes: Negative for double vision or worsening visual disturbance.  Respiratory: Negative for choking and stridor.   Gastrointestinal: Negative for vomiting or other signifcant bowel change Genitourinary: Negative for hematuria or change in urine volume.  Musculoskeletal: Negative for other MSK pain or swelling Skin: Negative for color change and worsening wound.  Neurological: Negative for tremors and numbness other than noted  Psychiatric/Behavioral: Negative for decreased concentration or agitation other than above       Objective:   Physical Exam BP 116/78 mmHg  Pulse 48  Temp(Src) 98.4 F (36.9 C) (Oral)  Resp 18  Ht 5\' 3"  (1.6 m)  Wt 144 lb 1.3 oz (65.354 kg)  BMI 25.53 kg/m2  SpO2 97% VS noted,  Constitutional: Pt appears in no significant distress HENT: Head: NCAT.  Right Ear: External ear normal.  Left Ear: External ear normal.  Eyes: . Pupils are equal, round, and reactive to light. Conjunctivae and EOM are normal Neck: Normal range of motion. Neck supple.  Cardiovascular: Normal rate and regular rhythm.   Pulmonary/Chest: Effort normal and breath sounds without rales or wheezing.  Abd:  Soft, NT, ND, + BS Neurological: Pt is alert. Not confused , motor grossly intact, cn 2-12 intact Skin: Skin is warm. No rash, no LE edema Psychiatric: Pt behavior is  normal. No agitation. not depressed affect     Assessment & Plan:

## 2014-05-10 NOTE — Progress Notes (Signed)
Pre visit review using our clinic review tool, if applicable. No additional management support is needed unless otherwise documented below in the visit note. 

## 2014-05-10 NOTE — Assessment & Plan Note (Signed)
stable overall by history and exam, recent data reviewed with pt, and pt to continue medical treatment as before,  to f/u any worsening symptoms or concerns BP Readings from Last 3 Encounters:  05/10/14 116/78  05/09/14 110/65  12/27/13 110/78

## 2014-05-11 ENCOUNTER — Telehealth: Payer: Self-pay | Admitting: *Deleted

## 2014-05-11 NOTE — Telephone Encounter (Signed)
[facility] - Client [facility] Patient Name: OMAYRA TULLOCH Gender: Female DOB: Mar 21, 1960 Age: 54 Y 53 M 5 D Return Phone Number: [phone] (Primary) Address: [address] City/State/Zip: [place] [address] Client [facility] - Client Client Site [facility] - Day Physician Cathlean Cower Contact Type Call Call Type Triage / Clinical Relationship To Patient Self Appointment Disposition EMR Appointment Not Necessary Info pasted into Epic Yes Return Phone Number [phone] (Primary) Chief Complaint NUMBNESS - sudden on one side of face or body Initial Comment caller states she has a headache and numbness on left side of her face and neck PreDisposition Did not know what to do Nurse Assessment Nurse: Mechele Dawley, RN, Amy Date/Time Eilene Ghazi Time): 05/09/2014 2:38:18 PM Confirm and document reason for call. If symptomatic, describe symptoms. ---CALLER STATES THAT SHE STARTED OUT WITH A HEADACHE ON THE LEFT SIDE OF HER FACE. SHE IS HAVING SOME NUMBNESS ON THE LEFT SIDE OF HER FACE AND NECK. SHE STATES IT HAS GRADUALLY GOTTEN WORSE. SHE IS HAVING SOME NAUSEA. SHE USED TO HAVE MIGRAINES BEFORE SHE HAD A HYSTERECTOMY IN 2002 AND NONE SINCE. SHE IS FINDING IT HARD TO TALK, SHE IS HAVING A HARD TIME TALKING. IT IS INTO THE NECK ONLY. Has the patient traveled out of the country within the last 30 days? ---Not Applicable Does the patient require triage? ---Yes Related visit to physician within the last 2 weeks? ---No Does the PT have any chronic conditions? (i.e. diabetes, asthma, etc.) ---No Did the patient indicate they were pregnant? ---No Guidelines Guideline Title Affirmed Question Affirmed Notes Nurse Date/Time Eilene Ghazi Time) Neurologic Deficit Headache (and neurologic deficit) [place], RN, Amy 05/09/2014 2:39:26 PM Disp. Time Eilene Ghazi Time) Disposition Final User 05/09/2014 2:35:02 PM  Send to Urgent Queue [facility] 05/09/2014 2:40:41 PM Go to ED Now Yes Mechele Dawley, RN, Amy PLEASE NOTE: All timestamps contained within this report are represented as [place] Standard Time. CONFIDENTIALTY NOTICE: This fax transmission is intended only for the addressee. It contains information that is legally privileged, confidential or otherwise protected from use or disclosure. If you are not the intended recipient, you are strictly prohibited from reviewing, disclosing, copying using or disseminating any of this information or taking any action in reliance on or regarding this information. If you have received this fax in error, please notify us immediately by telephone so that we can arrange for its return to [place]. Phone: [phone], Toll-Free: [phone], Fax: [phone] Page: 2 of 2 Call Id: [id number] Caller Understands: Yes Disagree/Comply: Comply Care Advice Given Per Guideline GO TO ED NOW: You need to be seen in the Emergency Department. Go to the ER at ___________ [place] now. Drive carefully. CARE ADVICE given per Neurologic Deficit (Adult) guideline. After Care Instructions Given Call Event Type User Date / Time Description Referrals GO TO FACILITY UNDECIDED

## 2014-05-14 ENCOUNTER — Telehealth: Payer: Self-pay | Admitting: Internal Medicine

## 2014-05-14 NOTE — Telephone Encounter (Signed)
Patient is requesting refill for   HYDROcodone-acetaminophen (NORCO/VICODIN) 5-325 MG per tablet [[id number]]      Sent to [facility]. She was not aware that the original script from the ER was only for 10 pills. She needs refill until neuro surgeon referral is completed.

## 2014-05-14 NOTE — Telephone Encounter (Signed)
MD is out of office will hold until he return tomorrow...Kelli Bell

## 2014-05-15 MED ORDER — HYDROCODONE-ACETAMINOPHEN 5-325 MG PO TABS: 1.0000 | ORAL_TABLET | Freq: Four times a day (QID) | ORAL | Status: DC | PRN

## 2014-05-15 NOTE — Telephone Encounter (Signed)
Done hardcopy to Cherina  

## 2014-05-15 NOTE — Telephone Encounter (Signed)
Notified pt rx ready for pick-up.../lmb 

## 2014-05-15 NOTE — Telephone Encounter (Signed)
Pt came in to check up on this request, pt was wondering if Dr. Jenny Reichmann can help her with this because she can not see the Neurology surgeon until June 2016. Please call her

## 2014-05-29 ENCOUNTER — Ambulatory Visit (INDEPENDENT_AMBULATORY_CARE_PROVIDER_SITE_OTHER): Payer: Managed Care, Other (non HMO) | Admitting: Women's Health

## 2014-05-29 ENCOUNTER — Encounter: Payer: Self-pay | Admitting: Women's Health

## 2014-05-29 ENCOUNTER — Ambulatory Visit: Payer: Managed Care, Other (non HMO)

## 2014-05-29 VITALS — BP 120/70 | Ht 62.0 in | Wt 144.0 lb

## 2014-05-29 DIAGNOSIS — Z01419 Encounter for gynecological examination (general) (routine) without abnormal findings: Secondary | ICD-10-CM | POA: Diagnosis not present

## 2014-05-29 DIAGNOSIS — Z79890 Hormone replacement therapy (postmenopausal): Secondary | ICD-10-CM

## 2014-05-29 NOTE — Patient Instructions (Signed)
Health Recommendations for Postmenopausal Women Respected and ongoing research has looked at the most common causes of death, disability, and poor quality of life in postmenopausal women. The causes include heart disease, diseases of blood vessels, diabetes, depression, cancer, and bone loss (osteoporosis). Many things can be done to help lower the chances of developing these and other common problems. CARDIOVASCULAR DISEASE Heart Disease: A heart attack is a medical emergency. Know the signs and symptoms of a heart attack. Below are things women can do to reduce their risk for heart disease.   Do not smoke. If you smoke, quit.  Aim for a healthy weight. Being overweight causes many preventable deaths. Eat a healthy and balanced diet and drink an adequate amount of liquids.  Get moving. Make a commitment to be more physically active. Aim for 30 minutes of activity on most, if not all days of the week.  Eat for heart health. Choose a diet that is low in saturated fat and cholesterol and eliminate trans fat. Include whole grains, vegetables, and fruits. Read and understand the labels on food containers before buying.  Know your numbers. Ask your caregiver to check your blood pressure, cholesterol (total, HDL, LDL, triglycerides) and blood glucose. Work with your caregiver on improving your entire clinical picture.  High blood pressure. Limit or stop your table salt intake (try salt substitute and food seasonings). Avoid salty foods and drinks. Read labels on food containers before buying. Eating well and exercising can help control high blood pressure. STROKE  Stroke is a medical emergency. Stroke may be the result of a blood clot in a blood vessel in the brain or by a brain hemorrhage (bleeding). Know the signs and symptoms of a stroke. To lower the risk of developing a stroke:  Avoid fatty foods.  Quit smoking.  Control your diabetes, blood pressure, and irregular heart  rate. THROMBOPHLEBITIS (BLOOD CLOT) OF THE LEG  Becoming overweight and leading a stationary lifestyle may also contribute to developing blood clots. Controlling your diet and exercising will help lower the risk of developing blood clots. CANCER SCREENING  Breast Cancer: Take steps to reduce your risk of breast cancer.  You should practice "breast self-awareness." This means understanding the normal appearance and feel of your breasts and should include breast self-examination. Any changes detected, no matter how small, should be reported to your caregiver.  After age 40, you should have a clinical breast exam (CBE) every year.  Starting at age 40, you should consider having a mammogram (breast X-ray) every year.  If you have a family history of breast cancer, talk to your caregiver about genetic screening.  If you are at high risk for breast cancer, talk to your caregiver about having an MRI and a mammogram every year.  Intestinal or Stomach Cancer: Tests to consider are a rectal exam, fecal occult blood, sigmoidoscopy, and colonoscopy. Women who are high risk may need to be screened at an earlier age and more often.  Cervical Cancer:  Beginning at age 30, you should have a Pap test every 3 years as long as the past 3 Pap tests have been normal.  If you have had past treatment for cervical cancer or a condition that could lead to cancer, you need Pap tests and screening for cancer for at least 20 years after your treatment.  If you had a hysterectomy for a problem that was not cancer or a condition that could lead to cancer, then you no longer need Pap tests.    If you are between ages 46 and 58, and you have had normal Pap tests going back 10 years, you no longer need Pap tests.  If Pap tests have been discontinued, risk factors (such as a new sexual partner) need to be reassessed to determine if screening should be resumed.  Some medical problems can increase the chance of getting  cervical cancer. In these cases, your caregiver may recommend more frequent screening and Pap tests.  Uterine Cancer: If you have vaginal bleeding after reaching menopause, you should notify your caregiver.  Ovarian Cancer: Other than yearly pelvic exams, there are no reliable tests available to screen for ovarian cancer at this time except for yearly pelvic exams.  Lung Cancer: Yearly chest X-rays can detect lung cancer and should be done on high risk women, such as cigarette smokers and women with chronic lung disease (emphysema).  Skin Cancer: A complete body skin exam should be done at your yearly examination. Avoid overexposure to the sun and ultraviolet light lamps. Use a strong sun block cream when in the sun. All of these things are important for lowering the risk of skin cancer. MENOPAUSE Menopause Symptoms: Hormone therapy products are effective for treating symptoms associated with menopause:  Moderate to severe hot flashes.  Night sweats.  Mood swings.  Headaches.  Tiredness.  Loss of sex drive.  Insomnia.  Other symptoms. Hormone replacement carries certain risks, especially in older women. Women who use or are thinking about using estrogen or estrogen with progestin treatments should discuss that with their caregiver. Your caregiver will help you understand the benefits and risks. The ideal dose of hormone replacement therapy is not known. The Food and Drug Administration (FDA) has concluded that hormone therapy should be used only at the lowest doses and for the shortest amount of time to reach treatment goals.  OSTEOPOROSIS Protecting Against Bone Loss and Preventing Fracture If you use hormone therapy for prevention of bone loss (osteoporosis), the risks for bone loss must outweigh the risk of the therapy. Ask your caregiver about other medications known to be safe and effective for preventing bone loss and fractures. To guard against bone loss or fractures, the  following is recommended:  If you are younger than age 33, take 1000 mg of calcium and at least 600 mg of Vitamin D per day.  If you are older than age 67 but younger than age 30, take 1200 mg of calcium and at least 600 mg of Vitamin D per day.  If you are older than age 47, take 1200 mg of calcium and at least 800 mg of Vitamin D per day. Smoking and excessive alcohol intake increases the risk of osteoporosis. Eat foods rich in calcium and vitamin D and do weight bearing exercises several times a week as your caregiver suggests. DIABETES Diabetes Mellitus: If you have type I or type 2 diabetes, you should keep your blood sugar under control with diet, exercise, and recommended medication. Avoid starchy and fatty foods, and too many sweets. Being overweight can make diabetes control more difficult. COGNITION AND MEMORY Cognition and Memory: Menopausal hormone therapy is not recommended for the prevention of cognitive disorders such as Alzheimer's disease or memory loss.  DEPRESSION  Depression may occur at any age, but it is common in elderly women. This may be because of physical, medical, social (loneliness), or financial problems and needs. If you are experiencing depression because of medical problems and control of symptoms, talk to your caregiver about this. Physical  activity and exercise may help with mood and sleep. Community and volunteer involvement may improve your sense of value and worth. If you have depression and you feel that the problem is getting worse or becoming severe, talk to your caregiver about which treatment options are best for you. ACCIDENTS  Accidents are common and can be serious in elderly woman. Prepare your house to prevent accidents. Eliminate throw rugs, place hand bars in bath, shower, and toilet areas. Avoid wearing high heeled shoes or walking on wet, snowy, and icy areas. Limit or stop driving if you have vision or hearing problems, or if you feel you are  unsteady with your movements and reflexes. HEPATITIS C Hepatitis C is a type of viral infection affecting the liver. It is spread mainly through contact with blood from an infected person. It can be treated, but if left untreated, it can lead to severe liver damage over the years. Many people who are infected do not know that the virus is in their blood. If you are a "baby-boomer", it is recommended that you have one screening test for Hepatitis C. IMMUNIZATIONS  Several immunizations are important to consider having during your senior years, including:   Tetanus, diphtheria, and pertussis booster shot.  Influenza every year before the flu season begins.  Pneumonia vaccine.  Shingles vaccine.  Others, as indicated based on your specific needs. Talk to your caregiver about these. Document Released: 03/06/2005 Document Revised: 05/29/2013 Document Reviewed: 10/31/2007 ExitCare Patient Information 2015 [employer]. This information is not intended to replace advice given to you by your health care provider. Make sure you discuss any questions you have with your health care provider. Exercise to Stay Healthy Exercise helps you become and stay healthy. EXERCISE IDEAS AND TIPS Choose exercises that:  You enjoy.  Fit into your day. You do not need to exercise really hard to be healthy. You can do exercises at a slow or medium level and stay healthy. You can:  Stretch before and after working out.  Try yoga, Pilates, or tai chi.  Lift weights.  Walk fast, swim, jog, run, climb stairs, bicycle, dance, or rollerskate.  Take aerobic classes. Exercises that burn about 150 calories:  Running 1  miles in 15 minutes.  Playing volleyball for 45 to 60 minutes.  Washing and waxing a car for 45 to 60 minutes.  Playing touch football for 45 minutes.  Walking 1  miles in 35 minutes.  Pushing a stroller 1  miles in 30 minutes.  Playing basketball for 30 minutes.  Raking leaves  for 30 minutes.  Bicycling 5 miles in 30 minutes.  Walking 2 miles in 30 minutes.  Dancing for 30 minutes.  Shoveling snow for 15 minutes.  Swimming laps for 20 minutes.  Walking up stairs for 15 minutes.  Bicycling 4 miles in 15 minutes.  Gardening for 30 to 45 minutes.  Jumping rope for 15 minutes.  Washing windows or floors for 45 to 60 minutes. Document Released: 02/14/2010 Document Revised: 04/06/2011 Document Reviewed: 02/14/2010 ExitCare Patient Information 2015 [employer]. This information is not intended to replace advice given to you by your health care provider. Make sure you discuss any questions you have with your health care provider.  

## 2014-05-29 NOTE — Progress Notes (Signed)
Kelli Bell 10-09-1960 [id number]    History:    Presents for annual exam.   2004 TAH with BSO for menorrhagia, had been on estradiol has now stopped. Normal Pap and mammogram history. Gastric bypass surgery 2015 has had a 70 pound weight loss. DEXA primary care manages. Negative colonoscopy at 54.  Past medical history, past surgical history, family history and social history were all reviewed and documented in the EPIC chart. Retired. 2013 husband had esophageal cancer, metastasis to his lung currently in remission. Not sexually active/husbands health.  ROS:  A ROS was performed and pertinent positives and negatives are included.  Exam:  Filed Vitals:   05/29/14 1049  BP: 120/70    General appearance:  Normal Thyroid:  Symmetrical, normal in size, without palpable masses or nodularity. Respiratory  Auscultation:  Clear without wheezing or rhonchi Cardiovascular  Auscultation:  Regular rate, without rubs, murmurs or gallops  Edema/varicosities:  Not grossly evident Abdominal  Soft,nontender, without masses, guarding or rebound.  Liver/spleen:  No organomegaly noted  Hernia:  None appreciated  Skin  Inspection:  Grossly normal   Breasts: Examined lying and sitting.     Right: Without masses, retractions, discharge or axillary adenopathy.     Left: Without masses, retractions, discharge or axillary adenopathy. Gentitourinary   Inguinal/mons:  Normal without inguinal adenopathy  External genitalia:  Normal  BUS/Urethra/Skene's glands:  Normal  Vagina:  Atrophic  Cervix: Absent  Uterus:  Absent normal in size, shape and contour.  Midline and mobile  Adnexa/parametria:     Rt: Without masses or tenderness.   Lt: Without masses or tenderness.  Anus and perineum: Normal  Digital rectal exam: Normal sphincter tone without palpated masses or tenderness  Assessment/Plan:  54 y.o.MWF G2P2  for annual exam.     2004 TAH with BSO for menorrhagia stopped estradiol 2015 70 pound  weight loss after  bariatric surgery 2015 Hypothyroid-primary care manages labs and meds  Plan: Continue DEXA management through primary care. SBE's, continue annual screening mammogram, keep scheduled appointment in 2 days. Regular exercise, calcium rich diet, vitamin D 1000 daily encouraged. Home safety, fall prevention discussed. UA. Encouraged to discuss pneumonia vaccine with primary care due to husbands poor health.   Huel Cote WHNP, 1:09 PM 05/29/2014

## 2014-05-30 LAB — URINALYSIS W MICROSCOPIC + REFLEX CULTURE
Bacteria, UA: NONE SEEN
Bilirubin Urine: NEGATIVE
Casts: NONE SEEN
Crystals: NONE SEEN
Glucose, UA: NEGATIVE mg/dL
Hgb urine dipstick: NEGATIVE
Ketones, ur: NEGATIVE mg/dL
Leukocytes, UA: NEGATIVE
Nitrite: NEGATIVE
Protein, ur: NEGATIVE mg/dL
Specific Gravity, Urine: 1.016 (ref 1.005–1.030)
Squamous Epithelial / LPF: NONE SEEN
Urobilinogen, UA: 0.2 mg/dL (ref 0.0–1.0)
pH: 7 (ref 5.0–8.0)

## 2014-05-31 ENCOUNTER — Ambulatory Visit
Admission: RE | Admit: 2014-05-31 | Discharge: 2014-05-31 | Disposition: A | Discharge status: Home / Self Care | Payer: Managed Care, Other (non HMO) | Source: Ambulatory Visit

## 2014-05-31 DIAGNOSIS — Z1231 Encounter for screening mammogram for malignant neoplasm of breast: Secondary | ICD-10-CM

## 2014-06-15 ENCOUNTER — Encounter: Payer: Self-pay | Admitting: Dietician

## 2014-06-15 ENCOUNTER — Encounter: Payer: Managed Care, Other (non HMO) | Attending: Surgery | Admitting: Dietician

## 2014-06-15 DIAGNOSIS — Z6825 Body mass index (BMI) 25.0-25.9, adult: Secondary | ICD-10-CM | POA: Insufficient documentation

## 2014-06-15 DIAGNOSIS — Z713 Dietary counseling and surveillance: Secondary | ICD-10-CM | POA: Insufficient documentation

## 2014-06-15 NOTE — Progress Notes (Signed)
  Follow-up visit:  9 months Post-Operative RYGB Surgery  Appt start time: 1020 end time: 1110  Primary concerns today: Post-operative Bariatric Surgery Nutrition Management.  Khrystal returns having reached her weight loss goal. Feeling healthy and has more energy. Doesn't want to lose any more. Still struggles to tolerate beef and pork. She likes to eat [place] and chicken. Still drinking protein shakes to supplement protein intake. Eating a lot of fruit and vegetables but no bread.  Surgery date: 09/12/2013 Surgery type: RYGB Start weight at [facility]: 203 lbs on 08/08/2013 (211 lbs per patient) Weight today: 141 Weight change: 5 lbs Total weight lost: 70 lbs  TANITA  BODY COMP RESULTS  08/28/13 09/25/13 11/06/13 12/20/13 02/20/14 06/15/14   BMI (kg/m^2) 36.1 33.6 29.9 28.5 26 25.8   Fat Mass (lbs) 95.0 83.5 68.5 59.5 48.5 48   Fat Free Mass (lbs) 108.5 106 100.5 101.5 98 93   Total Body Water (lbs) 79.5 77.5 73.5 74.5 71.5 68    Preferred Learning Style:   No preference indicated   Learning Readiness:   Ready  Dietary recall: Anwyn reports having something to eat every few hours. B: cottage cheese with fruit, coffee, 4oz orange juice Cream soups, string cheese, beans deli [place] with cottage cheese, tuna with low fat mayonnaise, 1-2 oz [place], pork, chicken, or salmon with green beans, cooked vegetables  Having 1-2 Premier protein shakes per day.  Fluid intake: water, decaf coffee, skim milk, Premier protein shakes (11oz), sugar free jello Estimated total protein intake: 60+ grams per day  Medications: no longer on BP meds and bladder meds Supplementation: taking  Using straws: no Drinking while eating: no Hair loss: none Carbonated beverages: no N/V/D/C: nausea Dumping syndrome: "dumping sensation" with steak  Recent physical activity:  Elliptical 2-3 days a week  Progress Towards Goal(s):  In progress.    Nutritional Diagnosis:  [place]-3.3 Overweight/obesity related to  past poor dietary habits and physical inactivity as evidenced by patient w/ recent RYGB surgery following dietary guidelines for continued weight loss.     Intervention:  Nutrition counseling provided.   Teaching Method Utilized:  Visual Auditory  Barriers to learning/adherence to lifestyle change: family stress  Demonstrated degree of understanding via:  Teach Back   Monitoring/Evaluation:  Dietary intake, exercise, and body weight. Follow up in 3 months for 12 month post-op visit.

## 2014-06-27 ENCOUNTER — Other Ambulatory Visit: Payer: Self-pay | Admitting: Internal Medicine

## 2014-06-27 DIAGNOSIS — M316 Other giant cell arteritis: Secondary | ICD-10-CM | POA: Insufficient documentation

## 2014-06-27 NOTE — Telephone Encounter (Signed)
OK X1 

## 2014-06-27 NOTE — Telephone Encounter (Signed)
Md out of office pls advise on refill...Johny Chess

## 2014-06-27 NOTE — Telephone Encounter (Signed)
Called refill into pharmacy spoke with Aaron Edelman gave md approval.../lmb

## 2014-07-06 ENCOUNTER — Other Ambulatory Visit: Payer: Self-pay | Admitting: Neurological Surgery

## 2014-07-06 DIAGNOSIS — G500 Trigeminal neuralgia: Secondary | ICD-10-CM

## 2014-07-09 ENCOUNTER — Other Ambulatory Visit: Payer: Self-pay | Admitting: Internal Medicine

## 2014-07-10 ENCOUNTER — Inpatient Hospital Stay: Admission: RE | Admit: 2014-07-10 | Payer: Managed Care, Other (non HMO) | Source: Ambulatory Visit

## 2014-07-10 NOTE — Telephone Encounter (Signed)
Done hardcopy to Dahlia  

## 2014-07-10 NOTE — Telephone Encounter (Signed)
Rx faxed to pharmacy  

## 2014-07-17 ENCOUNTER — Other Ambulatory Visit: Payer: Self-pay | Admitting: Women's Health

## 2014-07-17 ENCOUNTER — Other Ambulatory Visit: Payer: Self-pay | Admitting: Neurological Surgery

## 2014-07-17 DIAGNOSIS — G500 Trigeminal neuralgia: Secondary | ICD-10-CM

## 2014-07-18 ENCOUNTER — Telehealth: Payer: Self-pay | Admitting: Internal Medicine

## 2014-07-18 NOTE — Telephone Encounter (Signed)
Patient has jury duty  Monday 27, and need a letter stating she is the sole care giver for her husband with cancer.she need the letter today. Please advise

## 2014-07-18 NOTE — Telephone Encounter (Signed)
[facility] for letter, dahlia to help please

## 2014-07-18 NOTE — Telephone Encounter (Signed)
Letter generated and placed in cabinet for pt pick up

## 2014-07-25 ENCOUNTER — Other Ambulatory Visit: Payer: Managed Care, Other (non HMO)

## 2014-08-13 ENCOUNTER — Other Ambulatory Visit: Payer: Self-pay | Admitting: Internal Medicine

## 2014-08-13 NOTE — Telephone Encounter (Signed)
Please advise, pt has not seen [facility].

## 2014-08-14 NOTE — Telephone Encounter (Signed)
Rx faxed to pharmacy  

## 2014-08-14 NOTE — Telephone Encounter (Signed)
Done hardcopy to Dahlia  

## 2014-10-04 ENCOUNTER — Other Ambulatory Visit: Payer: Self-pay | Admitting: Internal Medicine

## 2014-10-04 ENCOUNTER — Other Ambulatory Visit: Payer: Self-pay | Admitting: Women's Health

## 2014-10-04 NOTE — Telephone Encounter (Signed)
Done Done hardcopy to Dahlia  

## 2014-10-05 ENCOUNTER — Other Ambulatory Visit: Payer: Self-pay | Admitting: Internal Medicine

## 2014-10-05 ENCOUNTER — Other Ambulatory Visit: Payer: Self-pay | Admitting: Women's Health

## 2014-10-05 NOTE — Telephone Encounter (Signed)
Per note 05/29/14 "Hypothyroid-primary care manages labs and meds"

## 2014-10-05 NOTE — Telephone Encounter (Signed)
Rx faxed to pharmacy  

## 2014-10-07 NOTE — Telephone Encounter (Signed)
Please call and have her come to get TSH checked, she has lost over 75 pounds.  Ok to refill synthroid

## 2014-10-10 ENCOUNTER — Other Ambulatory Visit: Payer: Self-pay

## 2014-10-10 MED ORDER — OMEPRAZOLE 20 MG PO CPDR: 40.0000 mg | DELAYED_RELEASE_CAPSULE | Freq: Every day | ORAL | Status: DC

## 2014-10-23 ENCOUNTER — Ambulatory Visit
Admission: RE | Admit: 2014-10-23 | Discharge: 2014-10-23 | Disposition: A | Discharge status: Home / Self Care | Payer: Managed Care, Other (non HMO) | Source: Ambulatory Visit | Attending: Neurological Surgery | Admitting: Neurological Surgery

## 2014-10-23 ENCOUNTER — Other Ambulatory Visit: Payer: Managed Care, Other (non HMO)

## 2014-10-23 DIAGNOSIS — G500 Trigeminal neuralgia: Secondary | ICD-10-CM

## 2014-10-23 MED ORDER — GADOBENATE DIMEGLUMINE 529 MG/ML IV SOLN
12.0000 mL | Freq: Once | INTRAVENOUS | Status: AC | PRN
  Administered 2014-10-23: 12 mL via INTRAVENOUS

## 2014-11-02 ENCOUNTER — Other Ambulatory Visit: Payer: Self-pay | Admitting: Internal Medicine

## 2014-11-28 ENCOUNTER — Other Ambulatory Visit: Payer: Self-pay | Admitting: Internal Medicine

## 2014-11-28 NOTE — Telephone Encounter (Signed)
Done hardcopy to Dahlia  

## 2014-11-29 NOTE — Telephone Encounter (Signed)
Rx faxed to pharmacy  

## 2014-12-24 ENCOUNTER — Other Ambulatory Visit: Payer: Self-pay | Admitting: Internal Medicine

## 2014-12-24 NOTE — Telephone Encounter (Signed)
Ok to refill 

## 2014-12-25 NOTE — Telephone Encounter (Signed)
Done hardcopy to Dahlia  

## 2014-12-25 NOTE — Telephone Encounter (Signed)
Rx faxed to pharmacy  

## 2015-01-31 MED FILL — ALPRAZolam 0.25 MG TABS: 0.25 | 30 days supply | Qty: 90 | Fill #1

## 2015-02-01 ENCOUNTER — Other Ambulatory Visit: Payer: Self-pay | Admitting: Internal Medicine

## 2015-02-08 ENCOUNTER — Other Ambulatory Visit: Payer: Self-pay | Admitting: Internal Medicine

## 2015-02-08 MED FILL — carBAMazepine 100 MG CHEW: 100 | 30 days supply | Qty: 60 | Fill #0

## 2015-02-08 MED FILL — TOPIRAMATE 50 MG TABLET: 50 | 30 days supply | Qty: 30 | Fill #3

## 2015-02-26 MED FILL — carBAMazepine 100 MG CHEW: 100 | 30 days supply | Qty: 120 | Fill #0

## 2015-02-28 MED FILL — ALPRAZolam 0.25 MG TABS: 0.25 | 30 days supply | Qty: 90 | Fill #2

## 2015-03-06 MED FILL — traMADol HCL 50 MG TABS: 50 | 15 days supply | Qty: 60 | Fill #2

## 2015-03-06 MED FILL — LEVOTHYROXINE 88 MCG TABLET: 88 | 30 days supply | Qty: 30 | Fill #3

## 2015-03-09 MED FILL — OMEPRAZOLE DR 20 MG CAPSULE: 20 | 90 days supply | Qty: 180 | Fill #0

## 2015-03-13 MED FILL — TOPIRAMATE 50 MG TABLET: 50 | 30 days supply | Qty: 30 | Fill #4

## 2015-03-27 ENCOUNTER — Other Ambulatory Visit: Payer: Self-pay | Admitting: Internal Medicine

## 2015-03-27 MED FILL — carBAMazepine 100 MG CHEW: 100 | 30 days supply | Qty: 120 | Fill #1

## 2015-03-28 NOTE — Telephone Encounter (Signed)
Needs to pick up in person and get UDS please. Also needs appointment as no future apt in the computer and almost 1 year since visit.

## 2015-03-28 NOTE — Telephone Encounter (Signed)
Please advise patient is requesting refill, Dr. Jenny Reichmann is out of the office

## 2015-03-29 ENCOUNTER — Other Ambulatory Visit: Payer: Self-pay | Admitting: Internal Medicine

## 2015-03-29 MED FILL — ALPRAZolam 0.25 MG TABS: 0.25 | 30 days supply | Qty: 90 | Fill #0

## 2015-03-29 NOTE — Telephone Encounter (Signed)
Rx faxed back to [place]...Kelli Bell

## 2015-03-29 NOTE — Telephone Encounter (Signed)
MD out of office pls advise on refill.../lmb 

## 2015-04-05 ENCOUNTER — Other Ambulatory Visit: Payer: Self-pay | Admitting: Women's Health

## 2015-04-05 MED FILL — LEVOTHYROXINE 88 MCG TABLET: 88 | 30 days supply | Qty: 30 | Fill #0

## 2015-04-05 MED FILL — TOPIRAMATE 50 MG TABLET: 50 | 30 days supply | Qty: 30 | Fill #5

## 2015-04-05 MED FILL — traMADol HCL 50 MG TABS: 50 | 15 days supply | Qty: 60 | Fill #3

## 2015-04-05 NOTE — Telephone Encounter (Signed)
Please call patient, best to get a repeat TSH, she had weight loss surgery last year. If she  is out of medication please call in 1 month supply until she can get TSH checked.

## 2015-04-11 ENCOUNTER — Other Ambulatory Visit: Payer: Self-pay

## 2015-04-11 DIAGNOSIS — Z1231 Encounter for screening mammogram for malignant neoplasm of breast: Secondary | ICD-10-CM

## 2015-05-06 ENCOUNTER — Other Ambulatory Visit: Payer: Self-pay | Admitting: Women's Health

## 2015-05-06 ENCOUNTER — Other Ambulatory Visit: Payer: Self-pay | Admitting: Family

## 2015-05-06 MED FILL — TOPIRAMATE 50 MG TABLET: 50 | 30 days supply | Qty: 30 | Fill #6

## 2015-05-07 MED FILL — ALPRAZolam 0.25 MG TABS: 0.25 | 30 days supply | Qty: 90 | Fill #0

## 2015-05-07 NOTE — Telephone Encounter (Signed)
Done hardcopy to [name]  

## 2015-05-07 NOTE — Telephone Encounter (Signed)
Medication has been sent to pharmacy.  °

## 2015-05-09 ENCOUNTER — Other Ambulatory Visit: Payer: Self-pay | Admitting: Women's Health

## 2015-05-14 ENCOUNTER — Telehealth: Payer: Self-pay | Admitting: *Deleted

## 2015-05-14 DIAGNOSIS — E039 Hypothyroidism, unspecified: Secondary | ICD-10-CM

## 2015-05-14 MED ORDER — LEVOTHYROXINE SODIUM 88 MCG PO TABS: 88.0000 ug | ORAL_TABLET | Freq: Every morning | ORAL | Status: DC

## 2015-05-14 MED FILL — LEVOTHYROXINE 88 MCG TABLET: 88 | 30 days supply | Qty: 30 | Fill #0

## 2015-05-14 NOTE — Telephone Encounter (Signed)
Pt aware, not able to come in today or tomorrow, I told her only 1 month Rx sent to pharmacy, will need to come in after her 1 week vacation and has lab drawn. i advised her to schedule lab appointment.

## 2015-05-14 NOTE — Telephone Encounter (Signed)
Pt called requesting refill on synthroid 88 mcg, Rx has been denied several times due to office note states PCP Dr.John manages labs and medication. Pt said that you have been refilling medication for her and Dr.John has never managed her thyroid level. Pt has annual scheduled on 07/02/15, leaving out of town for 7 days on Thursday. Please advise

## 2015-05-14 NOTE — Telephone Encounter (Signed)
Okay for refill, last TSH has been over a year, best to get it rechecked now, she has had weight loss and may need to have Synthroid dosage changed. Ask her if she can get it checked prior to her going out of town.

## 2015-05-28 MED FILL — OMEPRAZOLE DR 20 MG CAPSULE: 20 | 90 days supply | Qty: 180 | Fill #1

## 2015-06-04 ENCOUNTER — Encounter: Payer: Managed Care, Other (non HMO) | Admitting: Women's Health

## 2015-06-06 ENCOUNTER — Ambulatory Visit: Payer: Managed Care, Other (non HMO)

## 2015-06-25 MED FILL — TOPIRAMATE 50 MG TABLET: 50 | 30 days supply | Qty: 30 | Fill #7

## 2015-06-26 ENCOUNTER — Other Ambulatory Visit: Payer: Self-pay | Admitting: Internal Medicine

## 2015-06-26 ENCOUNTER — Other Ambulatory Visit: Payer: Self-pay | Admitting: Women's Health

## 2015-06-26 MED FILL — carBAMazepine 100 MG CHEW: 100 | 30 days supply | Qty: 120 | Fill #2

## 2015-06-26 MED FILL — ALPRAZolam 0.25 MG TABS: 0.25 | 30 days supply | Qty: 90 | Fill #0

## 2015-06-26 NOTE — Telephone Encounter (Signed)
Please advise 

## 2015-06-26 NOTE — Telephone Encounter (Signed)
Done hardcopy to [name]  

## 2015-06-26 NOTE — Telephone Encounter (Signed)
Medication refill sent to pharmacy  

## 2015-06-28 ENCOUNTER — Other Ambulatory Visit: Payer: Self-pay | Admitting: Internal Medicine

## 2015-06-28 ENCOUNTER — Other Ambulatory Visit: Payer: Self-pay | Admitting: Women's Health

## 2015-07-02 ENCOUNTER — Telehealth: Payer: Self-pay | Admitting: *Deleted

## 2015-07-02 ENCOUNTER — Encounter: Payer: Self-pay | Admitting: Women's Health

## 2015-07-02 ENCOUNTER — Ambulatory Visit (INDEPENDENT_AMBULATORY_CARE_PROVIDER_SITE_OTHER): Payer: Managed Care, Other (non HMO) | Admitting: Women's Health

## 2015-07-02 VITALS — BP 122/80 | Ht 62.0 in | Wt 145.0 lb

## 2015-07-02 DIAGNOSIS — Z658 Other specified problems related to psychosocial circumstances: Secondary | ICD-10-CM

## 2015-07-02 DIAGNOSIS — E039 Hypothyroidism, unspecified: Secondary | ICD-10-CM

## 2015-07-02 DIAGNOSIS — Z01419 Encounter for gynecological examination (general) (routine) without abnormal findings: Secondary | ICD-10-CM

## 2015-07-02 DIAGNOSIS — K2100 Gastro-esophageal reflux disease with esophagitis, without bleeding: Secondary | ICD-10-CM

## 2015-07-02 DIAGNOSIS — Z13220 Encounter for screening for lipoid disorders: Secondary | ICD-10-CM | POA: Diagnosis not present

## 2015-07-02 DIAGNOSIS — F439 Reaction to severe stress, unspecified: Secondary | ICD-10-CM

## 2015-07-02 LAB — COMPREHENSIVE METABOLIC PANEL
ALT: 21 U/L (ref 6–29)
AST: 20 U/L (ref 10–35)
Albumin: 4.3 g/dL (ref 3.6–5.1)
Alkaline Phosphatase: 79 U/L (ref 33–130)
BUN: 12 mg/dL (ref 7–25)
CO2: 27 mmol/L (ref 20–31)
Calcium: 9.3 mg/dL (ref 8.6–10.4)
Chloride: 105 mmol/L (ref 98–110)
Creat: 0.61 mg/dL (ref 0.50–1.05)
Glucose, Bld: 74 mg/dL (ref 65–99)
Potassium: 4.1 mmol/L (ref 3.5–5.3)
Sodium: 141 mmol/L (ref 135–146)
Total Bilirubin: 0.3 mg/dL (ref 0.2–1.2)
Total Protein: 6.3 g/dL (ref 6.1–8.1)

## 2015-07-02 LAB — LIPID PANEL
Cholesterol: 229 mg/dL — ABNORMAL HIGH (ref 125–200)
HDL: 153 mg/dL (ref >=46)
LDL Cholesterol: 61 mg/dL (ref <130)
Total CHOL/HDL Ratio: 1.5 Ratio (ref <=5.0)
Triglycerides: 77 mg/dL (ref <150)
VLDL: 15 mg/dL (ref <30)

## 2015-07-02 LAB — CBC WITH DIFFERENTIAL/PLATELET
Basophils Absolute: 46 cells/uL (ref 0–200)
Basophils Relative: 1 %
Eosinophils Absolute: 92 cells/uL (ref 15–500)
Eosinophils Relative: 2 %
HCT: 39 % (ref 35.0–45.0)
Hemoglobin: 13 g/dL (ref 11.7–15.5)
Lymphocytes Relative: 42 %
Lymphs Abs: 1932 cells/uL (ref 850–3900)
MCH: 30.7 pg (ref 27.0–33.0)
MCHC: 33.3 g/dL (ref 32.0–36.0)
MCV: 92.2 fL (ref 80.0–100.0)
MPV: 9 fL (ref 7.5–12.5)
Monocytes Absolute: 506 cells/uL (ref 200–950)
Monocytes Relative: 11 %
Neutro Abs: 2024 cells/uL (ref 1500–7800)
Neutrophils Relative %: 44 %
Platelets: 307 10*3/uL (ref 140–400)
RBC: 4.23 MIL/uL (ref 3.80–5.10)
RDW: 13.2 % (ref 11.0–15.0)
WBC: 4.6 10*3/uL (ref 3.8–10.8)

## 2015-07-02 LAB — T3 UPTAKE: T3 Uptake: 27 % (ref 22–35)

## 2015-07-02 LAB — T4: T4, Total: 7.5 ug/dL (ref 4.5–12.0)

## 2015-07-02 LAB — TSH: TSH: 2.26 mIU/L

## 2015-07-02 MED ORDER — LEVOTHYROXINE SODIUM 88 MCG PO TABS: 88.0000 ug | ORAL_TABLET | Freq: Every morning | ORAL | Status: DC

## 2015-07-02 MED ORDER — OMEPRAZOLE 20 MG PO CPDR: DELAYED_RELEASE_CAPSULE | ORAL | Status: DC

## 2015-07-02 MED ORDER — ALPRAZOLAM 0.25 MG PO TABS: ORAL_TABLET | ORAL | Status: DC

## 2015-07-02 MED FILL — OMEPRAZOLE DR 20 MG CAPSULE: 20 | 90 days supply | Qty: 180 | Fill #0

## 2015-07-02 MED FILL — LEVOTHYROXINE 88 MCG TABLET: 88 | 90 days supply | Qty: 90 | Fill #0

## 2015-07-02 MED FILL — ALPRAZolam 0.25 MG TABS: 0.25 | 30 days supply | Qty: 90 | Fill #0

## 2015-07-02 NOTE — Telephone Encounter (Signed)
Pharmacy called with questions about Rx for Xanax 0.25 mg with the # tablets

## 2015-07-02 NOTE — Patient Instructions (Signed)

## 2015-07-02 NOTE — Progress Notes (Signed)
Kelli Bell 1960-04-21 [id number]    History:    Presents for annual exam.  2004 TAH with BSO for benign purposes. Normal Pap and mammogram history. Hypothyroid has been off her thyroid medicine for 2 weeks was away and unable to get prescription refilled. 70 pound weight loss since bypass surgery in 2015.  Negative colonoscopy at age 55. Reports normal DEXA at primary care. Tearful, husband struggling with esophageal cancer with metastasis, doing poorly hospice involved, has lost 100 pounds. Caring for her husband at home.  Past medical history, past surgical history, family history and social history were all reviewed and documented in the EPIC chart. Homemaker.   ROS:  A ROS was performed and pertinent positives and negatives are included.  Exam:Caring for her husband at home.  Filed Vitals:   07/02/15 0913  BP: 122/80    General appearance:  Normal Thyroid:  Symmetrical, normal in size, without palpable masses or nodularity. Respiratory  Auscultation:  Clear without wheezing or rhonchi Cardiovascular  Auscultation:  Regular rate, without rubs, murmurs or gallops  Edema/varicosities:  Not grossly evident Abdominal  Soft,nontender, without masses, guarding or rebound.  Liver/spleen:  No organomegaly noted  Hernia:  None appreciated  Skin  Inspection:  Grossly normal   Breasts: Examined lying and sitting.     Right: Without masses, retractions, discharge or axillary adenopathy.     Left: Without masses, retractions, discharge or axillary adenopathy. Gentitourinary   Inguinal/mons:  Normal without inguinal adenopathy  External genitalia:  Normal  BUS/Urethra/Skene's glands:  Normal  Vagina:  Normal  Cervix:  And uterus absent Adnexa/parametria:     Rt: Without masses or tenderness.   Lt: Without masses or tenderness.  Anus and perineum: Normal  Digital rectal exam: Normal sphincter tone without palpated masses or tenderness  Assessment/Plan:  55 y.o. MWF G2 P2  for annual  exam.     Postmenopausal TAH with BSO on no HRT. Hypothyroid Situational stress/grief Migraines-neurologist manages meds  Plan: SBE's, continue annual screening mammogram, has scheduled for tomorrow. Exercise, leisure activities as able. Condolences given regarding husbands poor health. CBC, lipid panel, CMP, vitamin D, TSH, UA.   Xanax 25mg   by mouth when necessary every 8 hours, aware of addictive properties and to use sparingly. Prescriptions for Prilosec given,  Synthroid 88 g by mouth daily proper administration reviewed.   Huel Cote [facility], 9:53 AM 07/02/2015

## 2015-07-03 ENCOUNTER — Ambulatory Visit
Admission: RE | Admit: 2015-07-03 | Discharge: 2015-07-03 | Disposition: A | Discharge status: Home / Self Care | Payer: Managed Care, Other (non HMO) | Source: Ambulatory Visit

## 2015-07-03 DIAGNOSIS — Z1231 Encounter for screening mammogram for malignant neoplasm of breast: Secondary | ICD-10-CM

## 2015-07-03 LAB — URINALYSIS W MICROSCOPIC + REFLEX CULTURE
Bacteria, UA: NONE SEEN [HPF]
Bilirubin Urine: NEGATIVE
Casts: NONE SEEN [LPF]
Glucose, UA: NEGATIVE
Hgb urine dipstick: NEGATIVE
Ketones, ur: NEGATIVE
Leukocytes, UA: NEGATIVE
Nitrite: NEGATIVE
Protein, ur: NEGATIVE
Specific Gravity, Urine: 1.017 (ref 1.001–1.035)
Squamous Epithelial / LPF: NONE SEEN [HPF] (ref <=5)
Yeast: NONE SEEN [HPF]
pH: 7.5 (ref 5.0–8.0)

## 2015-07-03 LAB — VITAMIN D 25 HYDROXY (VIT D DEFICIENCY, FRACTURES): Vit D, 25-Hydroxy: 37 ng/mL (ref 30–100)

## 2015-07-03 MED FILL — TOPIRAMATE 50 MG TABLET: 50 | 30 days supply | Qty: 30 | Fill #0

## 2015-07-05 ENCOUNTER — Encounter: Payer: Self-pay | Admitting: Women's Health

## 2015-07-05 LAB — URINE CULTURE: Colony Count: 60000

## 2015-08-26 MED FILL — ALPRAZolam 0.25 MG TABS: 0.25 | 30 days supply | Qty: 90 | Fill #1

## 2015-08-26 MED FILL — carBAMazepine 100 MG CHEW: 100 | 30 days supply | Qty: 120 | Fill #3

## 2015-08-26 MED FILL — TOPIRAMATE 50 MG TABLET: 50 | 30 days supply | Qty: 30 | Fill #1

## 2015-09-09 ENCOUNTER — Other Ambulatory Visit: Payer: Self-pay | Admitting: Internal Medicine

## 2015-09-10 NOTE — Telephone Encounter (Signed)
Very sorry, unable to refill tramadol  Pt is due for ROV, last visit was April 2016

## 2015-09-10 NOTE — Telephone Encounter (Signed)
Spoke to patient, she is aware.

## 2015-09-25 MED FILL — ALPRAZolam 0.25 MG TABS: 0.25 | 30 days supply | Qty: 90 | Fill #1

## 2015-09-25 MED FILL — OMEPRAZOLE DR 20 MG CAPSULE: 20 | 90 days supply | Qty: 180 | Fill #1

## 2015-09-25 MED FILL — LEVOTHYROXINE 88 MCG TABLET: 88 | 90 days supply | Qty: 90 | Fill #1

## 2015-09-25 MED FILL — TOPIRAMATE 50 MG TABLET: 50 | 30 days supply | Qty: 30 | Fill #2

## 2015-10-21 ENCOUNTER — Other Ambulatory Visit: Payer: Self-pay | Admitting: Internal Medicine

## 2015-10-21 DIAGNOSIS — F439 Reaction to severe stress, unspecified: Secondary | ICD-10-CM

## 2015-10-22 MED FILL — traMADol HCL 50 MG TABS: 50 | 15 days supply | Qty: 60 | Fill #0

## 2015-10-22 NOTE — Telephone Encounter (Signed)
Done hardcopy to [name]  Please ask pt to make yearly ROV for further refills

## 2015-10-22 NOTE — Telephone Encounter (Signed)
faxed

## 2015-10-23 MED FILL — ALPRAZolam 0.25 MG TABS: 0.25 | 30 days supply | Qty: 90 | Fill #0

## 2015-10-31 ENCOUNTER — Ambulatory Visit (INDEPENDENT_AMBULATORY_CARE_PROVIDER_SITE_OTHER): Payer: Managed Care, Other (non HMO) | Admitting: Internal Medicine

## 2015-10-31 ENCOUNTER — Encounter: Payer: Self-pay | Admitting: Internal Medicine

## 2015-10-31 ENCOUNTER — Other Ambulatory Visit (INDEPENDENT_AMBULATORY_CARE_PROVIDER_SITE_OTHER): Payer: Managed Care, Other (non HMO)

## 2015-10-31 VITALS — BP 128/68 | HR 58 | Temp 98.0°F | Resp 20 | Wt 138.0 lb

## 2015-10-31 DIAGNOSIS — Z0001 Encounter for general adult medical examination with abnormal findings: Secondary | ICD-10-CM

## 2015-10-31 DIAGNOSIS — F32A Depression, unspecified: Secondary | ICD-10-CM

## 2015-10-31 DIAGNOSIS — F329 Major depressive disorder, single episode, unspecified: Secondary | ICD-10-CM

## 2015-10-31 DIAGNOSIS — Z1159 Encounter for screening for other viral diseases: Secondary | ICD-10-CM

## 2015-10-31 DIAGNOSIS — F411 Generalized anxiety disorder: Secondary | ICD-10-CM | POA: Diagnosis not present

## 2015-10-31 DIAGNOSIS — F4321 Adjustment disorder with depressed mood: Secondary | ICD-10-CM

## 2015-10-31 DIAGNOSIS — I10 Essential (primary) hypertension: Secondary | ICD-10-CM

## 2015-10-31 DIAGNOSIS — F4320 Adjustment disorder, unspecified: Secondary | ICD-10-CM | POA: Diagnosis not present

## 2015-10-31 DIAGNOSIS — F439 Reaction to severe stress, unspecified: Secondary | ICD-10-CM

## 2015-10-31 DIAGNOSIS — E039 Hypothyroidism, unspecified: Secondary | ICD-10-CM

## 2015-10-31 LAB — TSH: TSH: 0.62 u[IU]/mL (ref 0.35–4.50)

## 2015-10-31 LAB — T4, FREE: Free T4: 0.77 ng/dL (ref 0.60–1.60)

## 2015-10-31 MED ORDER — ALPRAZOLAM 0.25 MG PO TABS
ORAL_TABLET | ORAL | 3 refills | Status: DC
Start: 2015-10-31 — End: 2016-03-30

## 2015-10-31 NOTE — Assessment & Plan Note (Signed)
Ok for prn xanax refill

## 2015-10-31 NOTE — Assessment & Plan Note (Signed)
Ok for repeat thyroid testing with pt symtpoms including wt loss, but suspect her symptoms are related to grieving

## 2015-10-31 NOTE — Assessment & Plan Note (Signed)
To cont counseling as planned

## 2015-10-31 NOTE — Progress Notes (Signed)
Subjective:    Patient ID: Kelli Bell, female    DOB: May 03, 1960, 55 y.o.   MRN: [id number]  HPI  Here for wellness and f/u;  Overall doing ok;  Pt denies Chest pain, worsening SOB, DOE, wheezing, orthopnea, PND, worsening LE edema, palpitations, dizziness or syncope.  Pt denies neurological change such as new headache, facial or extremity weakness.  Pt denies polydipsia, polyuria, or low sugar symptoms. Pt states overall good compliance with treatment and medications, good tolerability, and has been trying to follow appropriate diet.  Pt denies significant  worsening depressive symptoms, suicidal ideation or panic, even though grievin for husband died 08-03-2015 with esoph cancer, now witih hospice counseling.  Has hx of depression but denies nee for tx at this time. . No fever, night sweats,  loss of appetite, or other constitutional symptoms.  Pt states good ability with ADL's, has low fall risk, home safety reviewed and adequate, no other significant changes in hearing or vision, and only occasionally active with exercise. S/p gastric bypass 2015 with significant wt loss.  Peak wt in past has been > 235. Wt Readings from Last 3 Encounters:  10/31/15 138 lb (62.6 kg)  07/02/15 145 lb (65.8 kg)  06/15/14 141 lb (64 kg)  Has had more stress recently with mother with c-spine surgury recently. 7 grandkids keep her busy as well.  Just had recent pap and mammogram.  Will have flu shot done later this month at work.  Denies hyper or hypo thyroid symptoms such as voice, skin or hair change.  Had normal TSH with last GYN exam but has felt hot and cold, hot and cold and occas night sweats, asks for re-check.  Still sees Dr Elsner/NS for trigeminal neuralgia, doing well on tegretol and topamax. Pt continues to have recurring LBP without change in severity, bowel or bladder change, fever, wt loss,  worsening LE pain/numbness/weakness, gait change or falls, sees Dr Ellene Route for this as well, only takes tramadol  occasionally for flares. Past Medical History:  Diagnosis Date  . ANXIETY 03/30/2007  . BACK PAIN 02/20/2009  . Essential hypertension, benign 05/31/2012  . Family history of anesthesia complication    "mother has hard time waking up"  . GERD 01/13/2007  . H/O hiatal hernia   . HYPERLIPIDEMIA 02/22/2008  . HYPOTHYROIDISM 03/30/2007  . MENOPAUSAL DISORDER 02/23/2008  . Nocturia   . OBSTRUCTIVE SLEEP APNEA 06/29/2007   has not used c pap x 8 months due to wt loss  . SINUSITIS- ACUTE-NOS 03/30/2007  . THORACIC/LUMBOSACRAL NEURITIS/RADICULITIS UNSPEC 02/23/2008  . Wheezing 01/08/2009   Past Surgical History:  Procedure Laterality Date  . ABDOMINAL HYSTERECTOMY  2004   with BSO  . BREATH TEK H PYLORI N/A 05/05/2013   Procedure: BREATH TEK H PYLORI;  Surgeon: Shann Medal, MD;  Location: [facility];  Service: General;  Laterality: N/A;  . CHOLECYSTECTOMY  1980  . GASTRIC ROUX-EN-Y N/A 09/12/2013   Procedure: LAPAROSCOPIC ROUX-EN-Y GASTRIC BYPASS WITH UPPER ENDOSCOPY WITH ENTEROLYSIS OF ADHESIONS;  Surgeon: Shann Medal, MD;  Location: [facility];  Service: General;  Laterality: N/A;  . jaw bone surgury     55 yo  . TONSILLECTOMY      reports that she has never smoked. She has never used smokeless tobacco. She reports that she does not drink alcohol or use drugs. family history includes Arthritis in her other; Asthma in her brother; Breast cancer in her maternal aunt; Cancer in her mother; Colon polyps (age  of onset: 33) in her father; Diabetes in her mother and paternal grandmother; Heart disease in her paternal grandmother; Hypertension in her mother; Lymphoma in her maternal grandmother; Osteoporosis in her other; Stroke in her mother; Thyroid disease in her maternal grandmother and mother. Allergies  Allergen Reactions  . Codeine Nausea Only   Current Outpatient Prescriptions on File Prior to Visit  Medication Sig Dispense Refill  . acetaminophen (TYLENOL) 325 MG tablet Take 650 mg by mouth  every 6 (six) hours as needed for mild pain or headache.    Marland Kitchen CALCIUM PO Take 5 mLs by mouth daily.    . carbamazepine (TEGRETOL) 100 MG chewable tablet CHEW 1 TABLET BY MOUTH 2 TIMES DAILY. 60 tablet 2  . Cyanocobalamin (B-12 PO) Take 5 mLs by mouth as needed (low b12).    . fluticasone (FLONASE) 50 MCG/ACT nasal spray Place 2 sprays into both nostrils daily. 16 g 2  . HYDROcodone-acetaminophen (NORCO/VICODIN) 5-325 MG per tablet Take 1 tablet by mouth every 6 (six) hours as needed. 60 tablet 0  . levothyroxine (SYNTHROID, LEVOTHROID) 88 MCG tablet Take 1 tablet (88 mcg total) by mouth every morning. 90 tablet 4  . Multiple Vitamins-Minerals (MULTIVITAMIN ADULT PO) Take 1 tablet by mouth daily.    Marland Kitchen omeprazole (PRILOSEC) 20 MG capsule TAKE 2 CAPSULES BY MOUTH DAILY. 180 capsule 4  . topiramate (TOPAMAX) 50 MG tablet Take 1 tablet by mouth daily.    . traMADol (ULTRAM) 50 MG tablet TAKE 1 TABLET BY MOUTH EVERY 6 HOURS AS NEEDED 60 tablet 0   No current facility-administered medications on file prior to visit.    Review of Systems Constitutional: Negative for increased diaphoresis, or other activity, appetite or siginficant weight change other than noted HENT: Negative for worsening hearing loss, ear pain, facial swelling, mouth sores and neck stiffness.   Eyes: Negative for other worsening pain, redness or visual disturbance.  Respiratory: Negative for choking or stridor Cardiovascular: Negative for other chest pain and palpitations.  Gastrointestinal: Negative for worsening diarrhea, blood in stool, or abdominal distention Genitourinary: Negative for hematuria, flank pain or change in urine volume.  Musculoskeletal: Negative for myalgias or other joint complaints.  Skin: Negative for other color change and wound or drainage.  Neurological: Negative for syncope and numbness. other than noted Hematological: Negative for adenopathy. or other swelling Psychiatric/Behavioral: Negative for  hallucinations, SI, self-injury, decreased concentration or other worsening agitation.      Objective:   Physical Exam BP 128/68   Pulse (!) 58   Temp 98 F (36.7 C) (Oral)   Resp 20   Wt 138 lb (62.6 kg)   SpO2 98%   BMI 25.24 kg/m  VS noted, has visibly lost wt Constitutional: Pt is oriented to person, place, and time. Appears well-developed and well-nourished, in no significant distress Head: Normocephalic and atraumatic  Eyes: Conjunctivae and EOM are normal. Pupils are equal, round, and reactive to light Right Ear: External ear normal.  Left Ear: External ear normal Nose: Nose normal.  Mouth/Throat: Oropharynx is clear and moist  Neck: Normal range of motion. Neck supple. No JVD present. No tracheal deviation present or significant neck LA or mass Cardiovascular: Normal rate, regular rhythm, normal heart sounds and intact distal pulses.   Pulmonary/Chest: Effort normal and breath sounds without rales or wheezing  Abdominal: Soft. Bowel sounds are normal. NT. No HSM  Musculoskeletal: Normal range of motion. Exhibits no edema Lymphadenopathy: Has no cervical adenopathy.  Neurological: Pt is alert and  oriented to person, place, and time. Pt has normal reflexes. No cranial nerve deficit. Motor grossly intact Skin: Skin is warm and dry. No rash noted or new ulcers Psychiatric:  Has mild saddened mood and affect. Behavior is normal.  Lab Results  Component Value Date   WBC 4.6 07/02/2015   HGB 13.0 07/02/2015   HCT 39.0 07/02/2015   PLT 307 07/02/2015   GLUCOSE 74 07/02/2015   CHOL 229 (H) 07/02/2015   TRIG 77 07/02/2015   HDL 153 07/02/2015   LDLDIRECT 94.8 04/29/2012   LDLCALC 61 07/02/2015   ALT 21 07/02/2015   AST 20 07/02/2015   NA 141 07/02/2015   K 4.1 07/02/2015   CL 105 07/02/2015   CREATININE 0.61 07/02/2015   BUN 12 07/02/2015   CO2 27 07/02/2015   TSH 2.26 07/02/2015         Assessment & Plan:

## 2015-10-31 NOTE — Assessment & Plan Note (Signed)
Overall doing well, age appropriate education and counseling updated, referrals for preventative services and immunizations addressed, dietary and smoking counseling addressed, most recent labs reviewed.  I have personally reviewed and have noted:  1) the patient's medical and social history 2) The pt's use of alcohol, tobacco, and illicit drugs 3) The patient's current medications and supplements 4) Functional ability including ADL's, fall risk, home safety risk, hearing and visual impairment 5) Diet and physical activities 6) Evidence for depression or mood disorder 7) The patient's height, weight, and BMI have been recorded in the chart  I have made referrals, and provided counseling and education based on review of the above Recent labs reviewed and do not need repeated at this time

## 2015-10-31 NOTE — Patient Instructions (Addendum)
Please continue all other medications as before, and refills have been done if requested - the xanax  Please have the pharmacy call with any other refills you may need.  Please continue your efforts at being more active, low cholesterol diet, and weight control.  You are otherwise up to date with prevention measures today.  Please keep your appointments with your specialists as you may have planned  Please go to the LAB in the Basement (turn left off the elevator) for the tests to be done today  You will be contacted by phone if any changes need to be made immediately.  Otherwise, you will receive a letter about your results with an explanation, but please check with MyChart first.  Please remember to sign up for MyChart if you have not done so, as this will be important to you in the future with finding out test results, communicating by private email, and scheduling acute appointments online when needed.  Please return in 1 year for your yearly visit, or sooner if needed, with Lab testing done 3-5 days before

## 2015-10-31 NOTE — Progress Notes (Signed)
Pre visit review using our clinic review tool, if applicable. No additional management support is needed unless otherwise documented below in the visit note. 

## 2015-10-31 NOTE — Assessment & Plan Note (Signed)
stable overall by history and exam, recent data reviewed with pt, and pt to continue medical treatment as before,  to f/u any worsening symptoms or concerns BP Readings from Last 3 Encounters:  10/31/15 128/68  07/02/15 122/80  05/29/14 120/70

## 2015-10-31 NOTE — Assessment & Plan Note (Signed)
Possible mild situational depression, declines zoloft for now,  to f/u any worsening symptoms or concerns

## 2015-11-01 LAB — HEPATITIS C ANTIBODY: HCV Ab: NEGATIVE

## 2015-11-05 MED FILL — carBAMazepine 100 MG CHEW: 100 | 30 days supply | Qty: 120 | Fill #4

## 2015-11-05 MED FILL — TOPIRAMATE 50 MG TABLET: 50 | 30 days supply | Qty: 30 | Fill #3

## 2015-11-20 MED FILL — ALPRAZolam 0.25 MG TABS: 0.25 | 30 days supply | Qty: 90 | Fill #0

## 2015-12-10 ENCOUNTER — Other Ambulatory Visit: Payer: Self-pay | Admitting: Internal Medicine

## 2015-12-10 MED FILL — LEVOTHYROXINE 88 MCG TABLET: 88 | 90 days supply | Qty: 90 | Fill #2

## 2015-12-10 MED FILL — TOPIRAMATE 50 MG TABLET: 50 | 30 days supply | Qty: 30 | Fill #4

## 2015-12-12 MED FILL — OMEPRAZOLE 20 MG CAPSULE DR: 20 | 90 days supply | Qty: 180 | Fill #0

## 2015-12-23 MED FILL — ALPRAZolam 0.25 MG TABS: 0.25 | 30 days supply | Qty: 90 | Fill #1

## 2015-12-23 MED FILL — carBAMazepine 100 MG CHEW: 100 | 30 days supply | Qty: 120 | Fill #5

## 2016-01-15 MED FILL — TOPIRAMATE 50 MG TABLET: 50 | 30 days supply | Qty: 30 | Fill #5

## 2016-01-22 MED FILL — ALPRAZolam 0.25 MG TABS: 0.25 | 30 days supply | Qty: 90 | Fill #2

## 2016-02-06 MED FILL — carBAMazepine 100 MG CHEW: 100 | 30 days supply | Qty: 120 | Fill #6

## 2016-02-20 MED FILL — ALPRAZolam 0.25 MG TABS: 0.25 | 30 days supply | Qty: 90 | Fill #3

## 2016-02-20 MED FILL — TOPIRAMATE 50 MG TABLET: 50 | 30 days supply | Qty: 30 | Fill #6

## 2016-02-26 ENCOUNTER — Other Ambulatory Visit: Payer: Self-pay | Admitting: Women's Health

## 2016-02-26 DIAGNOSIS — Z1231 Encounter for screening mammogram for malignant neoplasm of breast: Secondary | ICD-10-CM

## 2016-03-06 MED FILL — OMEPRAZOLE 20 MG CAPSULE DR: 20 | 90 days supply | Qty: 180 | Fill #1

## 2016-03-06 MED FILL — carBAMazepine 100 MG CHEW: 100 | 30 days supply | Qty: 120 | Fill #0

## 2016-03-29 MED FILL — TOPIRAMATE 50 MG TABLET: 50 | 30 days supply | Qty: 30 | Fill #7

## 2016-03-30 ENCOUNTER — Other Ambulatory Visit: Payer: Self-pay | Admitting: Internal Medicine

## 2016-03-30 DIAGNOSIS — F439 Reaction to severe stress, unspecified: Secondary | ICD-10-CM

## 2016-03-31 MED FILL — ALPRAZolam 0.25 MG TABS: 0.25 | 30 days supply | Qty: 90 | Fill #0

## 2016-03-31 NOTE — Telephone Encounter (Signed)
Done hardcopy to Anna  

## 2016-03-31 NOTE — Telephone Encounter (Signed)
Faxed script back to [facility]...Johny Chess

## 2016-04-01 IMAGING — CR DG CHEST 2V
2 series · 2 of 2 positions shown · non-contrast
Comparison: September 01, 2013

CLINICAL DATA: Nausea and pain in mandible region

EXAM:
CHEST  2 VIEW

[w chest pa]
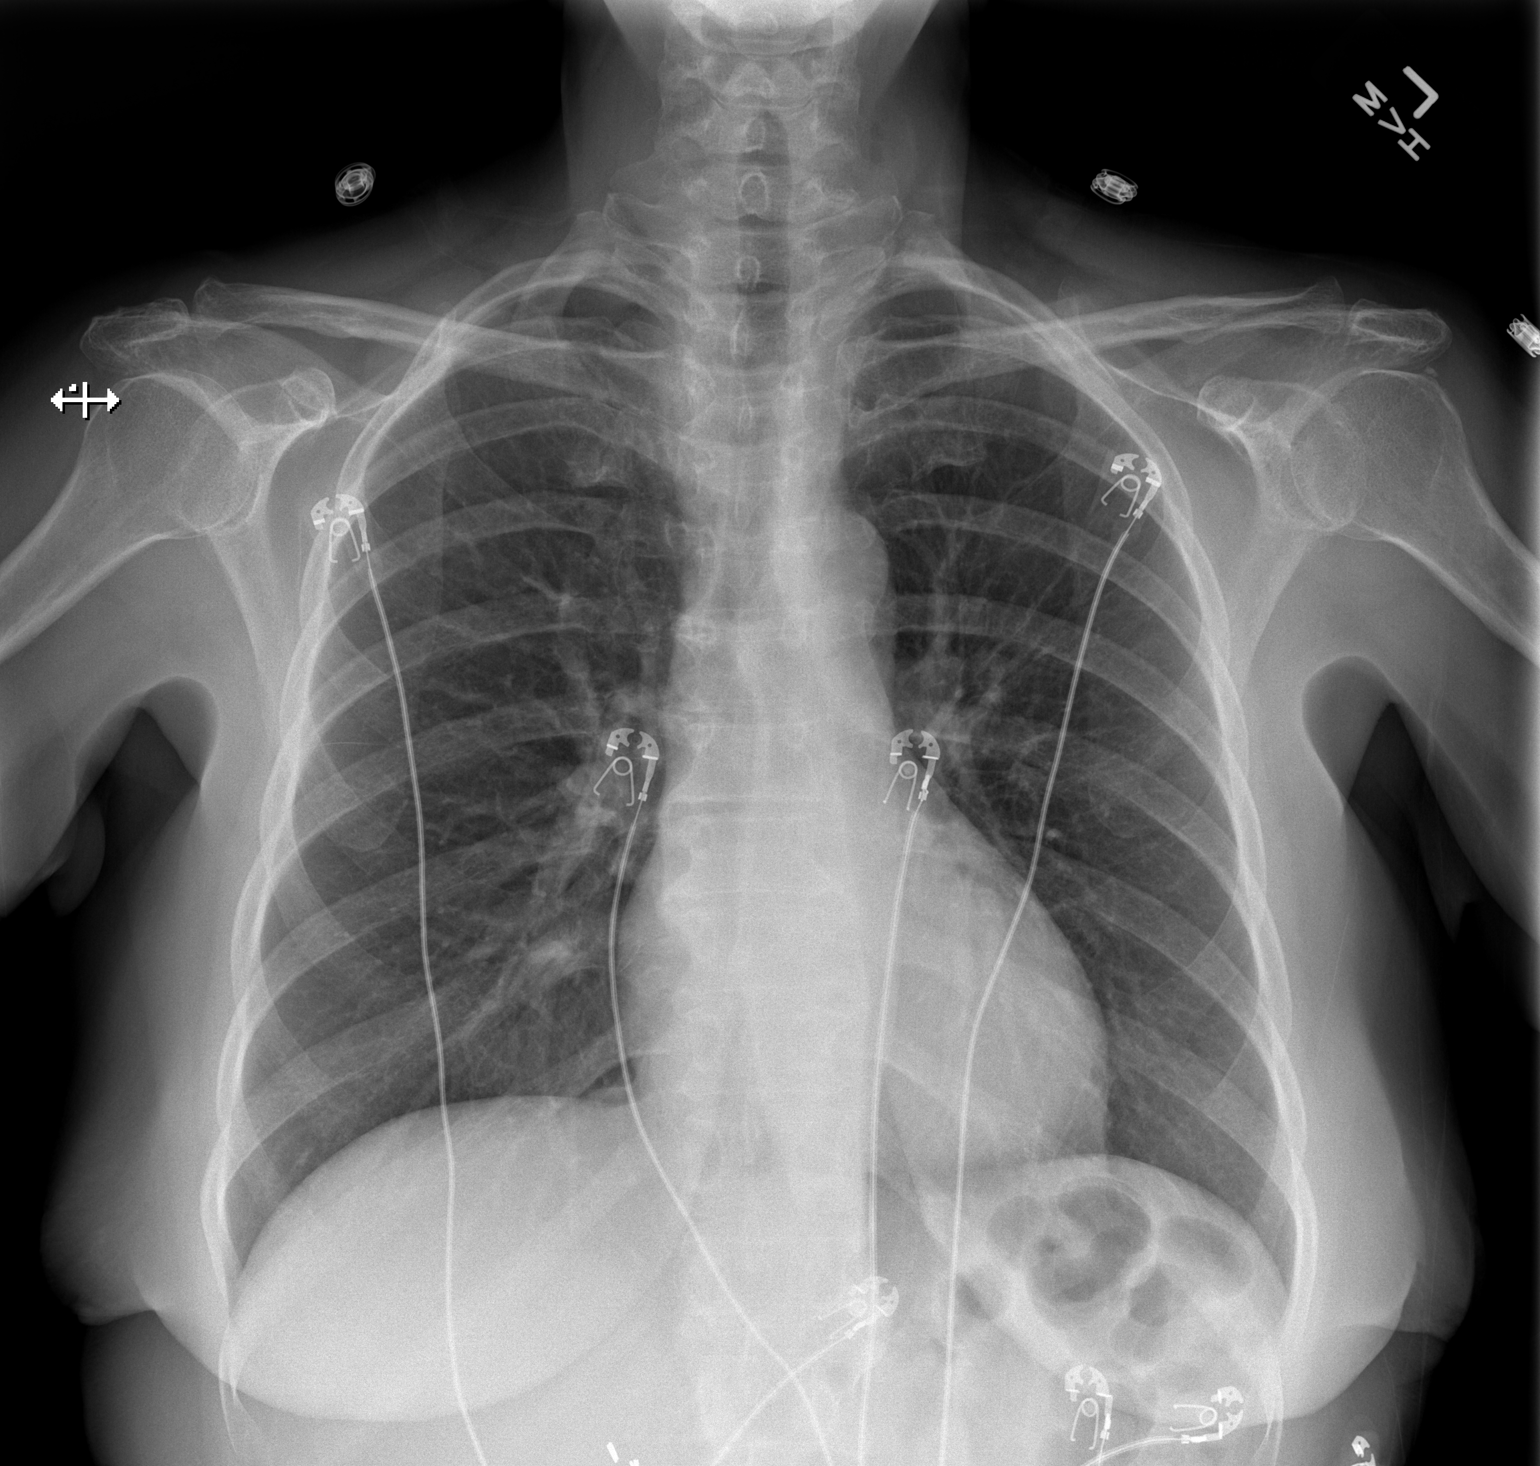

[w chest lat]
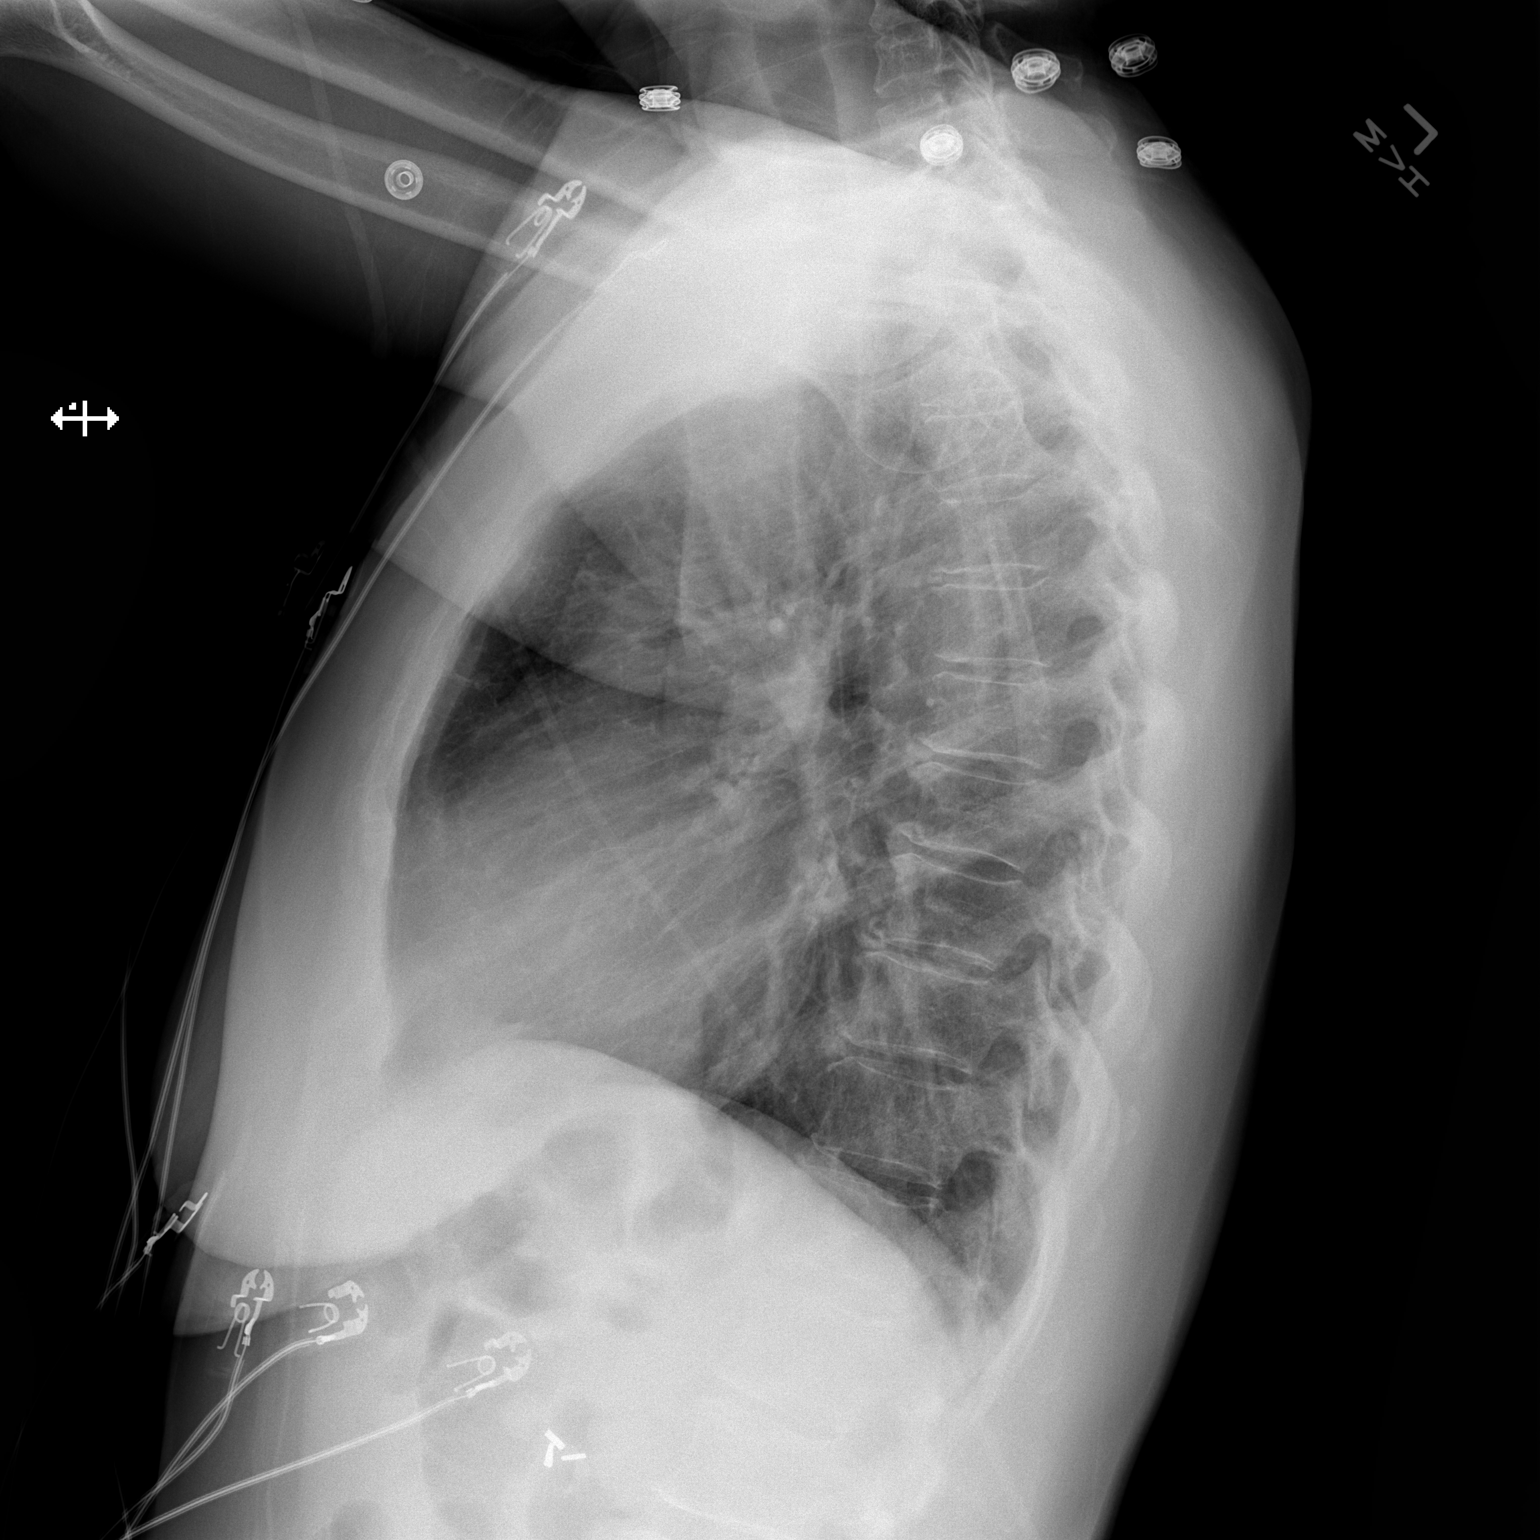

[2 of 2 positions shown; findings below may reference images not displayed]

FINDINGS: Lungs are clear. Heart size and pulmonary vascularity are normal. No
adenopathy. No pneumothorax. No bone lesions.
IMPRESSION: No edema or consolidation.

## 2016-04-01 MED FILL — carBAMazepine 100 MG CHEW: 100 | 30 days supply | Qty: 120 | Fill #1

## 2016-04-20 MED FILL — LEVOTHYROXINE 88 MCG TABLET: 88 | 90 days supply | Qty: 90 | Fill #3

## 2016-05-03 MED FILL — TOPIRAMATE 50 MG TABLET: 50 | 30 days supply | Qty: 30 | Fill #8

## 2016-05-03 MED FILL — ALPRAZolam 0.25 MG TABS: 0.25 | 30 days supply | Qty: 90 | Fill #1

## 2016-05-11 MED FILL — carBAMazepine 100 MG CHEW: 100 | 30 days supply | Qty: 120 | Fill #2

## 2016-06-03 MED FILL — ALPRAZolam 0.25 MG TABS: 0.25 | 30 days supply | Qty: 90 | Fill #2

## 2016-06-03 MED FILL — OMEPRAZOLE 20 MG CAPSULE DR: 20 | 90 days supply | Qty: 180 | Fill #0

## 2016-06-03 MED FILL — TOPIRAMATE 50 MG TABLET: 50 | 30 days supply | Qty: 30 | Fill #9

## 2016-06-11 MED FILL — carBAMazepine 100 MG CHEW: 100 | 30 days supply | Qty: 120 | Fill #3

## 2016-07-02 MED FILL — TOPIRAMATE 50 MG TABLET: 50 | 30 days supply | Qty: 30 | Fill #0

## 2016-07-03 ENCOUNTER — Ambulatory Visit: Payer: Managed Care, Other (non HMO)

## 2016-07-06 ENCOUNTER — Encounter: Payer: Self-pay | Admitting: Women's Health

## 2016-07-06 ENCOUNTER — Ambulatory Visit (INDEPENDENT_AMBULATORY_CARE_PROVIDER_SITE_OTHER): Payer: Managed Care, Other (non HMO) | Admitting: Women's Health

## 2016-07-06 VITALS — BP 138/86 | Ht 63.0 in | Wt 144.0 lb

## 2016-07-06 DIAGNOSIS — Z01419 Encounter for gynecological examination (general) (routine) without abnormal findings: Secondary | ICD-10-CM

## 2016-07-06 MED FILL — traMADol HCL 50 MG TABS: 50 | 23 days supply | Qty: 90 | Fill #0

## 2016-07-06 NOTE — Patient Instructions (Signed)
Health Maintenance for Postmenopausal Women Menopause is a normal process in which your reproductive ability comes to an end. This process happens gradually over a span of months to years, usually between the ages of 22 and 9. Menopause is complete when you have missed 12 consecutive menstrual periods. It is important to talk with your health care provider about some of the most common conditions that affect postmenopausal women, such as heart disease, cancer, and bone loss (osteoporosis). Adopting a healthy lifestyle and getting preventive care can help to promote your health and wellness. Those actions can also lower your chances of developing some of these common conditions. What should I know about menopause? During menopause, you may experience a number of symptoms, such as:  Moderate-to-severe hot flashes.  Night sweats.  Decrease in sex drive.  Mood swings.  Headaches.  Tiredness.  Irritability.  Memory problems.  Insomnia.  Choosing to treat or not to treat menopausal changes is an individual decision that you make with your health care provider. What should I know about hormone replacement therapy and supplements? Hormone therapy products are effective for treating symptoms that are associated with menopause, such as hot flashes and night sweats. Hormone replacement carries certain risks, especially as you become older. If you are thinking about using estrogen or estrogen with progestin treatments, discuss the benefits and risks with your health care provider. What should I know about heart disease and stroke? Heart disease, heart attack, and stroke become more likely as you age. This may be due, in part, to the hormonal changes that your body experiences during menopause. These can affect how your body processes dietary fats, triglycerides, and cholesterol. Heart attack and stroke are both medical emergencies. There are many things that you can do to help prevent heart disease  and stroke:  Have your blood pressure checked at least every 1-2 years. High blood pressure causes heart disease and increases the risk of stroke.  If you are 53-22 years old, ask your health care provider if you should take aspirin to prevent a heart attack or a stroke.  Do not use any tobacco products, including cigarettes, chewing tobacco, or electronic cigarettes. If you need help quitting, ask your health care provider.  It is important to eat a healthy diet and maintain a healthy weight. ? Be sure to include plenty of vegetables, fruits, low-fat dairy products, and lean protein. ? Avoid eating foods that are high in solid fats, added sugars, or salt (sodium).  Get regular exercise. This is one of the most important things that you can do for your health. ? Try to exercise for at least 150 minutes each week. The type of exercise that you do should increase your heart rate and make you sweat. This is known as moderate-intensity exercise. ? Try to do strengthening exercises at least twice each week. Do these in addition to the moderate-intensity exercise.  Know your numbers.Ask your health care provider to check your cholesterol and your blood glucose. Continue to have your blood tested as directed by your health care provider.  What should I know about cancer screening? There are several types of cancer. Take the following steps to reduce your risk and to catch any cancer development as early as possible. Breast Cancer  Practice breast self-awareness. ? This means understanding how your breasts normally appear and feel. ? It also means doing regular breast self-exams. Let your health care provider know about any changes, no matter how small.  If you are 40  or older, have a clinician do a breast exam (clinical breast exam or CBE) every year. Depending on your age, family history, and medical history, it may be recommended that you also have a yearly breast X-ray (mammogram).  If you  have a family history of breast cancer, talk with your health care provider about genetic screening.  If you are at high risk for breast cancer, talk with your health care provider about having an MRI and a mammogram every year.  Breast cancer (BRCA) gene test is recommended for women who have family members with BRCA-related cancers. Results of the assessment will determine the need for genetic counseling and BRCA1 and for BRCA2 testing. BRCA-related cancers include these types: ? Breast. This occurs in males or females. ? Ovarian. ? Tubal. This may also be called fallopian tube cancer. ? Cancer of the abdominal or pelvic lining (peritoneal cancer). ? Prostate. ? Pancreatic.  Cervical, Uterine, and Ovarian Cancer Your health care provider may recommend that you be screened regularly for cancer of the pelvic organs. These include your ovaries, uterus, and vagina. This screening involves a pelvic exam, which includes checking for microscopic changes to the surface of your cervix (Pap test).  For women ages 21-65, health care providers may recommend a pelvic exam and a Pap test every three years. For women ages 79-65, they may recommend the Pap test and pelvic exam, combined with testing for human papilloma virus (HPV), every five years. Some types of HPV increase your risk of cervical cancer. Testing for HPV may also be done on women of any age who have unclear Pap test results.  Other health care providers may not recommend any screening for nonpregnant women who are considered low risk for pelvic cancer and have no symptoms. Ask your health care provider if a screening pelvic exam is right for you.  If you have had past treatment for cervical cancer or a condition that could lead to cancer, you need Pap tests and screening for cancer for at least 20 years after your treatment. If Pap tests have been discontinued for you, your risk factors (such as having a new sexual partner) need to be  reassessed to determine if you should start having screenings again. Some women have medical problems that increase the chance of getting cervical cancer. In these cases, your health care provider may recommend that you have screening and Pap tests more often.  If you have a family history of uterine cancer or ovarian cancer, talk with your health care provider about genetic screening.  If you have vaginal bleeding after reaching menopause, tell your health care provider.  There are currently no reliable tests available to screen for ovarian cancer.  Lung Cancer Lung cancer screening is recommended for adults 69-62 years old who are at high risk for lung cancer because of a history of smoking. A yearly low-dose CT scan of the lungs is recommended if you:  Currently smoke.  Have a history of at least 30 pack-years of smoking and you currently smoke or have quit within the past 15 years. A pack-year is smoking an average of one pack of cigarettes per day for one year.  Yearly screening should:  Continue until it has been 15 years since you quit.  Stop if you develop a health problem that would prevent you from having lung cancer treatment.  Colorectal Cancer  This type of cancer can be detected and can often be prevented.  Routine colorectal cancer screening usually begins at  age 42 and continues through age 45.  If you have risk factors for colon cancer, your health care provider may recommend that you be screened at an earlier age.  If you have a family history of colorectal cancer, talk with your health care provider about genetic screening.  Your health care provider may also recommend using home test kits to check for hidden blood in your stool.  A small camera at the end of a tube can be used to examine your colon directly (sigmoidoscopy or colonoscopy). This is done to check for the earliest forms of colorectal cancer.  Direct examination of the colon should be repeated every  5-10 years until age 71. However, if early forms of precancerous polyps or small growths are found or if you have a family history or genetic risk for colorectal cancer, you may need to be screened more often.  Skin Cancer  Check your skin from head to toe regularly.  Monitor any moles. Be sure to tell your health care provider: ? About any new moles or changes in moles, especially if there is a change in a mole's shape or color. ? If you have a mole that is larger than the size of a pencil eraser.  If any of your family members has a history of skin cancer, especially at a [place] age, talk with your health care provider about genetic screening.  Always use sunscreen. Apply sunscreen liberally and repeatedly throughout the day.  Whenever you are outside, protect yourself by wearing long sleeves, pants, a wide-brimmed hat, and sunglasses.  What should I know about osteoporosis? Osteoporosis is a condition in which bone destruction happens more quickly than new bone creation. After menopause, you may be at an increased risk for osteoporosis. To help prevent osteoporosis or the bone fractures that can happen because of osteoporosis, the following is recommended:  If you are 46-71 years old, get at least 1,000 mg of calcium and at least 600 mg of vitamin D per day.  If you are older than age 55 but younger than age 65, get at least 1,200 mg of calcium and at least 600 mg of vitamin D per day.  If you are older than age 54, get at least 1,200 mg of calcium and at least 800 mg of vitamin D per day.  Smoking and excessive alcohol intake increase the risk of osteoporosis. Eat foods that are rich in calcium and vitamin D, and do weight-bearing exercises several times each week as directed by your health care provider. What should I know about how menopause affects my mental health? Depression may occur at any age, but it is more common as you become older. Common symptoms of depression  include:  Low or sad mood.  Changes in sleep patterns.  Changes in appetite or eating patterns.  Feeling an overall lack of motivation or enjoyment of activities that you previously enjoyed.  Frequent crying spells.  Talk with your health care provider if you think that you are experiencing depression. What should I know about immunizations? It is important that you get and maintain your immunizations. These include:  Tetanus, diphtheria, and pertussis (Tdap) booster vaccine.  Influenza every year before the flu season begins.  Pneumonia vaccine.  Shingles vaccine.  Your health care provider may also recommend other immunizations. This information is not intended to replace advice given to you by your health care provider. Make sure you discuss any questions you have with your health care provider. Document Released: 03/06/2005  Document Revised: 08/02/2015 Document Reviewed: 10/16/2014 Elsevier Interactive Patient Education  2018 [employer]  

## 2016-07-06 NOTE — Progress Notes (Signed)
Kelli Bell 02-10-1960 [id number]    History:    Presents for annual exam.  2004 TAH with BSO for endometriosis on no HRT. Normal Pap and mammogram history. 2014 reports normal DEXA at primary care. 2012 negative colonoscopy. 2015 gastric sleeve, 60 pound wt loss, has maintained.  Past medical history, past surgical history, family history and social history were all reviewed and documented in the EPIC chart. Husband died 10-Sep-2015.  ROS:  A ROS was performed and pertinent positives and negatives are included.  Exam:  Vitals:   07/06/16 0912  BP: 138/86  Weight: 144 lb (65.3 kg)  Height: 5\' 3"  (1.6 m)   Body mass index is 25.51 kg/m.   General appearance:  Normal Thyroid:  Symmetrical, normal in size, without palpable masses or nodularity. Respiratory  Auscultation:  Clear without wheezing or rhonchi Cardiovascular  Auscultation:  Regular rate, without rubs, murmurs or gallops  Edema/varicosities:  Not grossly evident Abdominal  Soft,nontender, without masses, guarding or rebound.  Liver/spleen:  No organomegaly noted  Hernia:  None appreciated  Skin  Inspection:  Grossly normal   Breasts: Examined lying and sitting.     Right: Without masses, retractions, discharge or axillary adenopathy.     Left: Without masses, retractions, discharge or axillary adenopathy. Gentitourinary   Inguinal/mons:  Normal without inguinal adenopathy  External genitalia:  Normal  BUS/Urethra/Skene's glands:  Normal  Vagina:  +1 rectocele/asymptomatic  Cervix:  And uterus absent  Adnexa/parametria:     Rt: Without masses or tenderness.   Lt: Without masses or tenderness.  Anus and perineum: Normal  Digital rectal exam: Normal sphincter tone without palpated masses or tenderness  Assessment/Plan:  56 y.o. W WF G2 P2 for annual exam with no complaints.  2004 TAH with BSO on no HRT 2015 gastric sleeve with maintained weight loss Hypothyroid - Primary care manages labs and meds  Plan:  SBE's, continue annual screening mammogram has scheduled for tomorrow. Exercise, calcium rich diet, vitamin D 2000 daily encouraged. Instructed to repeat DEXA. Home safety, fall prevention and importance of weightbearing exercise reviewed. Encouraged leisure activities. Condolences regarding husband's death.     Huel Cote [facility], 11:03 AM 07/06/2016

## 2016-07-07 ENCOUNTER — Ambulatory Visit: Payer: Managed Care, Other (non HMO)

## 2016-07-07 ENCOUNTER — Ambulatory Visit
Admission: RE | Admit: 2016-07-07 | Discharge: 2016-07-07 | Disposition: A | Discharge status: Home / Self Care | Payer: Managed Care, Other (non HMO) | Source: Ambulatory Visit | Attending: Women's Health | Admitting: Women's Health

## 2016-07-07 ENCOUNTER — Encounter: Payer: Self-pay | Admitting: Women's Health

## 2016-07-07 DIAGNOSIS — Z1231 Encounter for screening mammogram for malignant neoplasm of breast: Secondary | ICD-10-CM

## 2016-07-08 MED FILL — CARBAMAZEPINE ER 100 MG TAB: 100 | 30 days supply | Qty: 120 | Fill #0

## 2016-07-16 ENCOUNTER — Other Ambulatory Visit: Payer: Self-pay | Admitting: Women's Health

## 2016-07-16 DIAGNOSIS — E039 Hypothyroidism, unspecified: Secondary | ICD-10-CM

## 2016-07-16 DIAGNOSIS — K2100 Gastro-esophageal reflux disease with esophagitis, without bleeding: Secondary | ICD-10-CM

## 2016-07-16 MED FILL — ALPRAZolam 0.25 MG TABS: 0.25 | 30 days supply | Qty: 90 | Fill #3

## 2016-07-16 NOTE — Telephone Encounter (Signed)
Appointment on 07/14/16

## 2016-07-17 MED FILL — LEVOTHYROXINE 88 MCG TABLET: 88 | 90 days supply | Qty: 90 | Fill #0

## 2016-08-10 ENCOUNTER — Other Ambulatory Visit: Payer: Self-pay | Admitting: Internal Medicine

## 2016-08-10 DIAGNOSIS — F439 Reaction to severe stress, unspecified: Secondary | ICD-10-CM

## 2016-08-10 MED FILL — TOPIRAMATE 50 MG TABLET: 50 | 30 days supply | Qty: 30 | Fill #1

## 2016-08-10 MED FILL — OMEPRAZOLE 20 MG CAPSULE DR: 20 | 90 days supply | Qty: 180 | Fill #0

## 2016-08-10 MED FILL — CARBAMAZEPINE ER 100 MG TAB: 100 | 30 days supply | Qty: 120 | Fill #1

## 2016-08-11 MED FILL — ALPRAZolam 0.25 MG TABS: 0.25 | 30 days supply | Qty: 90 | Fill #0

## 2016-08-11 NOTE — Telephone Encounter (Signed)
Done hardcopy to Shirron  

## 2016-08-11 NOTE — Telephone Encounter (Signed)
faxed

## 2016-08-19 ENCOUNTER — Ambulatory Visit (INDEPENDENT_AMBULATORY_CARE_PROVIDER_SITE_OTHER): Payer: Managed Care, Other (non HMO) | Admitting: Internal Medicine

## 2016-08-19 ENCOUNTER — Encounter: Payer: Self-pay | Admitting: Internal Medicine

## 2016-08-19 VITALS — BP 124/76 | HR 71 | Ht 63.0 in | Wt 145.0 lb

## 2016-08-19 DIAGNOSIS — R42 Dizziness and giddiness: Secondary | ICD-10-CM | POA: Diagnosis not present

## 2016-08-19 DIAGNOSIS — Z114 Encounter for screening for human immunodeficiency virus [HIV]: Secondary | ICD-10-CM | POA: Diagnosis not present

## 2016-08-19 DIAGNOSIS — F32A Depression, unspecified: Secondary | ICD-10-CM

## 2016-08-19 DIAGNOSIS — K2100 Gastro-esophageal reflux disease with esophagitis, without bleeding: Secondary | ICD-10-CM

## 2016-08-19 DIAGNOSIS — F329 Major depressive disorder, single episode, unspecified: Secondary | ICD-10-CM

## 2016-08-19 MED ORDER — OMEPRAZOLE 20 MG PO CPDR: 40.0000 mg | DELAYED_RELEASE_CAPSULE | Freq: Every day | ORAL | 3 refills | Status: DC

## 2016-08-19 NOTE — Progress Notes (Signed)
Subjective:    Patient ID: Kelli Bell, female    DOB: 06-02-1960, 56 y.o.   MRN: [id number]  HPI  Here to f/u with c/o recurrent dizziness and weak spells over the last 2 wks about every other day, the last with blood sugar 62, gets clammy, chills and lightheaded and passes within 10 minutes.  S/p gastric bypass with gradual wt loss x 2 yrs. Husband died 1 yr ago.  Has 7 grandchildren.  Has had much stress related to husband, wonders if anxiety could be related. Pt denies chest pain, increased sob or doe, wheezing, orthopnea, PND, increased LE swelling, palpitations, dizziness or syncope.  Pt denies new neurological symptoms such as new headache, or facial weakness.   Pt denies polydipsia, polyuria, Past Medical History:  Diagnosis Date  . ANXIETY 03/30/2007  . BACK PAIN 02/20/2009  . Essential hypertension, benign 05/31/2012  . Family history of anesthesia complication    "mother has hard time waking up"  . GERD 01/13/2007  . H/O hiatal hernia   . HYPERLIPIDEMIA 02/22/2008  . HYPOTHYROIDISM 03/30/2007  . MENOPAUSAL DISORDER 02/23/2008  . Nocturia   . OBSTRUCTIVE SLEEP APNEA 06/29/2007   has not used c pap x 8 months due to wt loss  . SINUSITIS- ACUTE-NOS 03/30/2007  . THORACIC/LUMBOSACRAL NEURITIS/RADICULITIS UNSPEC 02/23/2008  . Wheezing 01/08/2009   Past Surgical History:  Procedure Laterality Date  . ABDOMINAL HYSTERECTOMY  2004   with BSO  . BREATH TEK H PYLORI N/A 05/05/2013   Procedure: BREATH TEK H PYLORI;  Surgeon: Shann Medal, MD;  Location: [facility];  Service: General;  Laterality: N/A;  . CHOLECYSTECTOMY  1980  . GASTRIC ROUX-EN-Y N/A 09/12/2013   Procedure: LAPAROSCOPIC ROUX-EN-Y GASTRIC BYPASS WITH UPPER ENDOSCOPY WITH ENTEROLYSIS OF ADHESIONS;  Surgeon: Shann Medal, MD;  Location: [facility];  Service: General;  Laterality: N/A;  . jaw bone surgury     56 yo  . TONSILLECTOMY      reports that she quit smoking about 19 years ago. She has never used smokeless tobacco. She  reports that she does not drink alcohol or use drugs. family history includes Arthritis in her other; Asthma in her brother; Breast cancer in her maternal aunt; Cancer in her mother; Colon polyps (age of onset: 69) in her father; Diabetes in her mother and paternal grandmother; Heart disease in her paternal grandmother; Hypertension in her mother; Lymphoma in her maternal grandmother; Osteoporosis in her other; Stroke in her mother; Thyroid disease in her maternal grandmother and mother. Allergies  Allergen Reactions  . Codeine Nausea Only   Current Outpatient Prescriptions on File Prior to Visit  Medication Sig Dispense Refill  . acetaminophen (TYLENOL) 325 MG tablet Take 650 mg by mouth every 6 (six) hours as needed for mild pain or headache.    . ALPRAZolam (XANAX) 0.25 MG tablet TAKE 1 TABLET BY MOUTH 3 TIMES DAILY AS NEEDED FOR ANXIETY 90 tablet 3  . CALCIUM PO Take 5 mLs by mouth daily.    . carbamazepine (TEGRETOL) 100 MG chewable tablet CHEW 1 TABLET BY MOUTH 2 TIMES DAILY. 60 tablet 2  . Cyanocobalamin (B-12 PO) Take 5 mLs by mouth as needed (low b12).    . Echinacea 500 MG CAPS Take by mouth.    . levothyroxine (SYNTHROID, LEVOTHROID) 88 MCG tablet TAKE 1 TABLET BY MOUTH EVERY MORNING. 90 tablet 0  . Multiple Vitamins-Minerals (MULTIVITAMIN ADULT PO) Take 1 tablet by mouth daily.    Marland Kitchen topiramate (TOPAMAX)  50 MG tablet Take 1 tablet by mouth daily.    . traMADol (ULTRAM) 50 MG tablet TAKE 1 TABLET BY MOUTH EVERY 6 HOURS AS NEEDED 60 tablet 0   No current facility-administered medications on file prior to visit.    Review of Systems  Constitutional: Negative for other unusual diaphoresis or sweats HENT: Negative for ear discharge or swelling Eyes: Negative for other worsening visual disturbances Respiratory: Negative for stridor or other swelling  Gastrointestinal: Negative for worsening distension or other blood Genitourinary: Negative for retention or other urinary  change Musculoskeletal: Negative for other MSK pain or swelling Skin: Negative for color change or other new lesions Neurological: Negative for worsening tremors and other numbness  Psychiatric/Behavioral: Negative for worsening agitation or other fatigue All other system neg per pt    Objective:   Physical Exam BP 124/76   Pulse 71   Ht 5\' 3"  (1.6 m)   Wt 145 lb (65.8 kg)   SpO2 99%   BMI 25.69 kg/m  VS noted,  Constitutional: Pt appears in NAD HENT: Head: NCAT.  Right Ear: External ear normal.  Left Ear: External ear normal.  Eyes: . Pupils are equal, round, and reactive to light. Conjunctivae and EOM are normal Nose: without d/c or deformity Neck: Neck supple. Gross normal ROM Cardiovascular: Normal rate and regular rhythm.   Pulmonary/Chest: Effort normal and breath sounds without rales or wheezing.  Abd:  Soft, NT, ND, + BS, no organomegaly Neurological: Pt is alert. At baseline orientation, motor grossly intact Skin: Skin is warm. No rashes, other new lesions, no LE edema Psychiatric: Pt behavior is normal without agitation , nervous, not depressed affect No other exam findings       Assessment & Plan:

## 2016-08-19 NOTE — Patient Instructions (Addendum)
Please continue all other medications as before, and refills have been done if requested.  Please have the pharmacy call with any other refills you may need.  Please continue your efforts at being more active, low cholesterol diet, and weight control.  You are otherwise up to date with prevention measures today.  Please keep your appointments with your specialists as you may have planned  Please go to the LAB in the Basement (turn left off the elevator) for the tests to be done tomorrow  You will be contacted by phone if any changes need to be made immediately.  Otherwise, you will receive a letter about your results with an explanation, but please check with MyChart first.  Please remember to sign up for MyChart if you have not done so, as this will be important to you in the future with finding out test results, communicating by private email, and scheduling acute appointments online when needed.  Please return in October for your Physical

## 2016-08-20 ENCOUNTER — Other Ambulatory Visit (INDEPENDENT_AMBULATORY_CARE_PROVIDER_SITE_OTHER): Payer: Managed Care, Other (non HMO)

## 2016-08-20 DIAGNOSIS — Z0001 Encounter for general adult medical examination with abnormal findings: Secondary | ICD-10-CM | POA: Diagnosis not present

## 2016-08-20 DIAGNOSIS — Z114 Encounter for screening for human immunodeficiency virus [HIV]: Secondary | ICD-10-CM

## 2016-08-20 LAB — CBC WITH DIFFERENTIAL/PLATELET
Basophils Absolute: 0.1 10*3/uL (ref 0.0–0.1)
Basophils Relative: 1.3 % (ref 0.0–3.0)
Eosinophils Absolute: 0.1 10*3/uL (ref 0.0–0.7)
Eosinophils Relative: 1.3 % (ref 0.0–5.0)
HCT: 38.5 % (ref 36.0–46.0)
Hemoglobin: 12.8 g/dL (ref 12.0–15.0)
Lymphocytes Relative: 38.7 % (ref 12.0–46.0)
Lymphs Abs: 2 10*3/uL (ref 0.7–4.0)
MCHC: 33.3 g/dL (ref 30.0–36.0)
MCV: 94 fl (ref 78.0–100.0)
Monocytes Absolute: 0.6 10*3/uL (ref 0.1–1.0)
Monocytes Relative: 11.1 % (ref 3.0–12.0)
Neutro Abs: 2.4 10*3/uL (ref 1.4–7.7)
Neutrophils Relative %: 47.6 % (ref 43.0–77.0)
Platelets: 307 10*3/uL (ref 150.0–400.0)
RBC: 4.1 Mil/uL (ref 3.87–5.11)
RDW: 12.5 % (ref 11.5–15.5)
WBC: 5 10*3/uL (ref 4.0–10.5)

## 2016-08-20 LAB — HEPATIC FUNCTION PANEL
ALT: 16 U/L (ref 0–35)
AST: 17 U/L (ref 0–37)
Albumin: 4 g/dL (ref 3.5–5.2)
Alkaline Phosphatase: 66 U/L (ref 39–117)
Bilirubin, Direct: 0.1 mg/dL (ref 0.0–0.3)
Total Bilirubin: 0.3 mg/dL (ref 0.2–1.2)
Total Protein: 6.4 g/dL (ref 6.0–8.3)

## 2016-08-20 LAB — BASIC METABOLIC PANEL
BUN: 11 mg/dL (ref 6–23)
CO2: 28 mEq/L (ref 19–32)
Calcium: 9.1 mg/dL (ref 8.4–10.5)
Chloride: 107 mEq/L (ref 96–112)
Creatinine, Ser: 0.58 mg/dL (ref 0.40–1.20)
GFR: 114.01 mL/min (ref >60.00)
Glucose, Bld: 99 mg/dL (ref 70–99)
Potassium: 4.2 mEq/L (ref 3.5–5.1)
Sodium: 141 mEq/L (ref 135–145)

## 2016-08-20 LAB — URINALYSIS, ROUTINE W REFLEX MICROSCOPIC
Bilirubin Urine: NEGATIVE
Hgb urine dipstick: NEGATIVE
Ketones, ur: NEGATIVE
Leukocytes, UA: NEGATIVE
Nitrite: NEGATIVE
RBC / HPF: NONE SEEN (ref 0–?)
Specific Gravity, Urine: 1.025 (ref 1.000–1.030)
Total Protein, Urine: NEGATIVE
Urine Glucose: NEGATIVE
Urobilinogen, UA: 0.2 (ref 0.0–1.0)
pH: 6 (ref 5.0–8.0)

## 2016-08-20 LAB — LIPID PANEL
Cholesterol: 214 mg/dL — ABNORMAL HIGH (ref 0–200)
HDL: 110.2 mg/dL (ref >39.00)
LDL Cholesterol: 90 mg/dL (ref 0–99)
NonHDL: 103.83
Total CHOL/HDL Ratio: 2
Triglycerides: 71 mg/dL (ref 0.0–149.0)
VLDL: 14.2 mg/dL (ref 0.0–40.0)

## 2016-08-20 LAB — HIV ANTIBODY (ROUTINE TESTING W REFLEX): HIV 1&2 Ab, 4th Generation: NONREACTIVE

## 2016-08-20 LAB — TSH: TSH: 0.81 u[IU]/mL (ref 0.35–4.50)

## 2016-08-22 DIAGNOSIS — R42 Dizziness and giddiness: Secondary | ICD-10-CM | POA: Insufficient documentation

## 2016-08-22 NOTE — Assessment & Plan Note (Signed)
stable overall by history and exam, and pt to continue medical treatment as before,  to f/u any worsening symptoms or concerns 

## 2016-08-22 NOTE — Assessment & Plan Note (Signed)
Etiology unclear, for labs as requested,  to f/u any worsening symptoms or concerns

## 2016-09-08 MED FILL — TOPIRAMATE 50 MG TABLET: 50 | 30 days supply | Qty: 30 | Fill #2

## 2016-09-08 MED FILL — CARBAMAZEPINE ER 100 MG TAB: 100 | 30 days supply | Qty: 120 | Fill #2

## 2016-09-08 MED FILL — ALPRAZolam 0.25 MG TABS: 0.25 | 30 days supply | Qty: 90 | Fill #1

## 2016-09-29 ENCOUNTER — Ambulatory Visit (INDEPENDENT_AMBULATORY_CARE_PROVIDER_SITE_OTHER): Payer: Managed Care, Other (non HMO) | Admitting: Internal Medicine

## 2016-09-29 ENCOUNTER — Encounter: Payer: Self-pay | Admitting: Internal Medicine

## 2016-09-29 VITALS — BP 116/72 | HR 62 | Ht 63.0 in | Wt 145.0 lb

## 2016-09-29 DIAGNOSIS — F329 Major depressive disorder, single episode, unspecified: Secondary | ICD-10-CM

## 2016-09-29 DIAGNOSIS — J018 Other acute sinusitis: Secondary | ICD-10-CM | POA: Diagnosis not present

## 2016-09-29 DIAGNOSIS — F32A Depression, unspecified: Secondary | ICD-10-CM

## 2016-09-29 DIAGNOSIS — J309 Allergic rhinitis, unspecified: Secondary | ICD-10-CM | POA: Diagnosis not present

## 2016-09-29 MED ORDER — METHYLPREDNISOLONE ACETATE 80 MG/ML IJ SUSP
80.0000 mg | Freq: Once | INTRAMUSCULAR | Status: AC
  Administered 2016-09-29: 80 mg via INTRAMUSCULAR

## 2016-09-29 MED ORDER — FLUCONAZOLE 150 MG PO TABS: ORAL_TABLET | ORAL | 1 refills | Status: DC

## 2016-09-29 MED ORDER — AZITHROMYCIN 250 MG PO TABS: ORAL_TABLET | ORAL | 1 refills | Status: DC

## 2016-09-29 MED FILL — AZITHROMYCIN 250 MG TAB: 250 | 5 days supply | Qty: 6 | Fill #0

## 2016-09-29 NOTE — Patient Instructions (Signed)
You had the steroid shot today  Please take all new medication as prescribed - the antibiotic  Please continue all other medications as before, and refills have been done if requested.  Please have the pharmacy call with any other refills you may need.  Please keep your appointments with your specialists as you may have planned   

## 2016-09-29 NOTE — Progress Notes (Signed)
Subjective:    Patient ID: Kelli Bell, female    DOB: 1960-09-30, 56 y.o.   MRN: [id number]  HPI   Here with 2-3 days acute onset fever, facial pain, pressure, headache, general weakness and malaise, and greenish d/c, with mild ST and cough, but pt denies chest pain, wheezing, increased sob or doe, orthopnea, PND, increased LE swelling, palpitations, dizziness or syncope.  Does have several wks ongoing nasal allergy symptoms with clearish congestion, itch and sneezing, without fever, pain, ST, cough, swelling or wheezing. Husband died less than 1 yr,  Denies worsening depressive symptoms, suicidal ideation, or panic; has ongoing anxiety. Past Medical History:  Diagnosis Date  . ANXIETY 03/30/2007  . BACK PAIN 02/20/2009  . Essential hypertension, benign 05/31/2012  . Family history of anesthesia complication    "mother has hard time waking up"  . GERD 01/13/2007  . H/O hiatal hernia   . HYPERLIPIDEMIA 02/22/2008  . HYPOTHYROIDISM 03/30/2007  . MENOPAUSAL DISORDER 02/23/2008  . Nocturia   . OBSTRUCTIVE SLEEP APNEA 06/29/2007   has not used c pap x 8 months due to wt loss  . SINUSITIS- ACUTE-NOS 03/30/2007  . THORACIC/LUMBOSACRAL NEURITIS/RADICULITIS UNSPEC 02/23/2008  . Wheezing 01/08/2009   Past Surgical History:  Procedure Laterality Date  . ABDOMINAL HYSTERECTOMY  2004   with BSO  . BREATH TEK H PYLORI N/A 05/05/2013   Procedure: BREATH TEK H PYLORI;  Surgeon: Shann Medal, MD;  Location: [facility];  Service: General;  Laterality: N/A;  . CHOLECYSTECTOMY  1980  . GASTRIC ROUX-EN-Y N/A 09/12/2013   Procedure: LAPAROSCOPIC ROUX-EN-Y GASTRIC BYPASS WITH UPPER ENDOSCOPY WITH ENTEROLYSIS OF ADHESIONS;  Surgeon: Shann Medal, MD;  Location: [facility];  Service: General;  Laterality: N/A;  . jaw bone surgury     56 yo  . TONSILLECTOMY      reports that she quit smoking about 19 years ago. She has never used smokeless tobacco. She reports that she does not drink alcohol or use drugs. family  history includes Arthritis in her other; Asthma in her brother; Breast cancer in her maternal aunt; Cancer in her mother; Colon polyps (age of onset: 76) in her father; Diabetes in her mother and paternal grandmother; Heart disease in her paternal grandmother; Hypertension in her mother; Lymphoma in her maternal grandmother; Osteoporosis in her other; Stroke in her mother; Thyroid disease in her maternal grandmother and mother. Allergies  Allergen Reactions  . Codeine Nausea Only   Current Outpatient Prescriptions on File Prior to Visit  Medication Sig Dispense Refill  . acetaminophen (TYLENOL) 325 MG tablet Take 650 mg by mouth every 6 (six) hours as needed for mild pain or headache.    . ALPRAZolam (XANAX) 0.25 MG tablet TAKE 1 TABLET BY MOUTH 3 TIMES DAILY AS NEEDED FOR ANXIETY 90 tablet 3  . CALCIUM PO Take 5 mLs by mouth daily.    . carbamazepine (TEGRETOL) 100 MG chewable tablet CHEW 1 TABLET BY MOUTH 2 TIMES DAILY. 60 tablet 2  . Cyanocobalamin (B-12 PO) Take 5 mLs by mouth as needed (low b12).    . Echinacea 500 MG CAPS Take by mouth.    . levothyroxine (SYNTHROID, LEVOTHROID) 88 MCG tablet TAKE 1 TABLET BY MOUTH EVERY MORNING. 90 tablet 0  . Multiple Vitamins-Minerals (MULTIVITAMIN ADULT PO) Take 1 tablet by mouth daily.    Marland Kitchen omeprazole (PRILOSEC) 20 MG capsule Take 2 capsules (40 mg total) by mouth daily. 180 capsule 3  . topiramate (TOPAMAX) 50 MG tablet  Take 1 tablet by mouth daily.    . traMADol (ULTRAM) 50 MG tablet TAKE 1 TABLET BY MOUTH EVERY 6 HOURS AS NEEDED 60 tablet 0   No current facility-administered medications on file prior to visit.    Review of Systems  Constitutional: Negative for other unusual diaphoresis or sweats HENT: Negative for ear discharge or swelling Eyes: Negative for other worsening visual disturbances Respiratory: Negative for stridor or other swelling  Gastrointestinal: Negative for worsening distension or other blood Genitourinary: Negative for  retention or other urinary change Musculoskeletal: Negative for other MSK pain or swelling Skin: Negative for color change or other new lesions Neurological: Negative for worsening tremors and other numbness  Psychiatric/Behavioral: Negative for worsening agitation or other fatigue All other system neg per pt    Objective:   Physical Exam BP 116/72   Pulse 62   Ht 5\' 3"  (1.6 m)   Wt 145 lb (65.8 kg)   SpO2 98%   BMI 25.69 kg/m  VS noted, mild ill Constitutional: Pt appears in NAD HENT: Head: NCAT.  Right Ear: External ear normal.  Left Ear: External ear normal.  Eyes: . Pupils are equal, round, and reactive to light. Conjunctivae and EOM are normal Nose: without d/c or deformity Bilat tm's with mild erythema.  Max sinus areas mild tender.  Pharynx with mild erythema, no exudate Neck: Neck supple. Gross normal ROM Cardiovascular: Normal rate and regular rhythm.   Pulmonary/Chest: Effort normal and breath sounds without rales or wheezing.  Neurological: Pt is alert. At baseline orientation, motor grossly intact Skin: Skin is warm. No rashes, other new lesions, no LE edema Psychiatric: Pt behavior is normal without agitation , not depressed affect No other exam findings        Assessment & Plan:

## 2016-09-29 NOTE — Assessment & Plan Note (Signed)
Mild to mod, for antibx course,  to f/u any worsening symptoms or concerns 

## 2016-09-29 NOTE — Assessment & Plan Note (Signed)
stable overall by history and exam, and pt to continue medical treatment as before,  to f/u any worsening symptoms or concerns 

## 2016-09-29 NOTE — Assessment & Plan Note (Signed)
Mild to mod, for depomedrol IM 80,  to f/u any worsening symptoms or concerns 

## 2016-09-30 MED FILL — FLUCONAZOLE 150 MG TABLET: 150 | 3 days supply | Qty: 2 | Fill #0

## 2016-10-05 MED FILL — AZITHROMYCIN 250 MG TAB: 250 | 5 days supply | Qty: 6 | Fill #1

## 2016-10-05 MED FILL — TOPIRAMATE 50 MG TABLET: 50 | 30 days supply | Qty: 30 | Fill #3

## 2016-10-08 MED FILL — traMADol HCL 50 MG TABS: 50 | 22 days supply | Qty: 90 | Fill #0

## 2016-10-13 ENCOUNTER — Other Ambulatory Visit: Payer: Self-pay | Admitting: Women's Health

## 2016-10-13 DIAGNOSIS — E039 Hypothyroidism, unspecified: Secondary | ICD-10-CM

## 2016-10-13 MED FILL — CARBAMAZEPINE ER 100 MG TAB: 100 | 30 days supply | Qty: 120 | Fill #3

## 2016-10-13 MED FILL — LEVOTHYROXINE 88 MCG TABLET: 88 | 90 days supply | Qty: 90 | Fill #0

## 2016-10-26 MED FILL — ALPRAZolam 0.25 MG TABS: 0.25 | 30 days supply | Qty: 90 | Fill #2

## 2016-11-02 MED FILL — TOPIRAMATE 50 MG TABLET: 50 | 30 days supply | Qty: 30 | Fill #4

## 2016-11-02 MED FILL — OMEPRAZOLE 20 MG CAP: 20 | 90 days supply | Qty: 180 | Fill #0

## 2016-11-03 ENCOUNTER — Encounter: Payer: Self-pay | Admitting: Internal Medicine

## 2016-11-03 ENCOUNTER — Ambulatory Visit (INDEPENDENT_AMBULATORY_CARE_PROVIDER_SITE_OTHER): Payer: Managed Care, Other (non HMO) | Admitting: Internal Medicine

## 2016-11-03 VITALS — BP 124/86 | HR 67 | Temp 97.9°F | Ht 63.0 in | Wt 142.0 lb

## 2016-11-03 DIAGNOSIS — Z23 Encounter for immunization: Secondary | ICD-10-CM

## 2016-11-03 DIAGNOSIS — Z0000 Encounter for general adult medical examination without abnormal findings: Secondary | ICD-10-CM

## 2016-11-03 MED ORDER — TRAMADOL HCL 50 MG PO TABS: 50.0000 mg | ORAL_TABLET | Freq: Four times a day (QID) | ORAL | 3 refills | Status: AC | PRN

## 2016-11-03 NOTE — Patient Instructions (Addendum)

## 2016-11-03 NOTE — Assessment & Plan Note (Signed)

## 2016-11-03 NOTE — Progress Notes (Signed)
Subjective:    Patient ID: Kelli Bell, female    DOB: 18-Dec-1960, 56 y.o.   MRN: [id number]  HPI  Here for wellness and f/u;  Overall doing ok;  Pt denies Chest pain, worsening SOB, DOE, wheezing, orthopnea, PND, worsening LE edema, palpitations, dizziness or syncope.  Pt denies neurological change such as new headache, facial or extremity weakness.  Pt denies polydipsia, polyuria, or low sugar symptoms. Pt states overall good compliance with treatment and medications, good tolerability, and has been trying to follow appropriate diet.  Pt denies worsening depressive symptoms, suicidal ideation or panic. No fever, night sweats, wt loss, loss of appetite, or other constitutional symptoms.  Pt states good ability with ADL's, has low fall risk, home safety reviewed and adequate, no other significant changes in hearing or vision, and only occasionally active with exercise.  Pt continues to have recurring LBP without change in severity, bowel or bladder change, fever, wt loss,  worsening LE pain/numbness/weakness, gait change or falls, asks for tramadol refill.  No other complaints or interval hx Past Medical History:  Diagnosis Date  . ANXIETY 03/30/2007  . BACK PAIN 02/20/2009  . Essential hypertension, benign 05/31/2012  . Family history of anesthesia complication    "mother has hard time waking up"  . GERD 01/13/2007  . H/O hiatal hernia   . HYPERLIPIDEMIA 02/22/2008  . HYPOTHYROIDISM 03/30/2007  . MENOPAUSAL DISORDER 02/23/2008  . Nocturia   . OBSTRUCTIVE SLEEP APNEA 06/29/2007   has not used c pap x 8 months due to wt loss  . SINUSITIS- ACUTE-NOS 03/30/2007  . THORACIC/LUMBOSACRAL NEURITIS/RADICULITIS UNSPEC 02/23/2008  . Wheezing 01/08/2009   Past Surgical History:  Procedure Laterality Date  . ABDOMINAL HYSTERECTOMY  2004   with BSO  . BREATH TEK H PYLORI N/A 05/05/2013   Procedure: BREATH TEK H PYLORI;  Surgeon: Shann Medal, MD;  Location: [facility];  Service: General;  Laterality: N/A;  .  CHOLECYSTECTOMY  1980  . GASTRIC ROUX-EN-Y N/A 09/12/2013   Procedure: LAPAROSCOPIC ROUX-EN-Y GASTRIC BYPASS WITH UPPER ENDOSCOPY WITH ENTEROLYSIS OF ADHESIONS;  Surgeon: Shann Medal, MD;  Location: [facility];  Service: General;  Laterality: N/A;  . jaw bone surgury     56 yo  . TONSILLECTOMY      reports that she quit smoking about 19 years ago. She has never used smokeless tobacco. She reports that she does not drink alcohol or use drugs. family history includes Arthritis in her other; Asthma in her brother; Breast cancer in her maternal aunt; Cancer in her mother; Colon polyps (age of onset: 50) in her father; Diabetes in her mother and paternal grandmother; Heart disease in her paternal grandmother; Hypertension in her mother; Lymphoma in her maternal grandmother; Osteoporosis in her other; Stroke in her mother; Thyroid disease in her maternal grandmother and mother. Allergies  Allergen Reactions  . Codeine Nausea Only   Current Outpatient Prescriptions on File Prior to Visit  Medication Sig Dispense Refill  . acetaminophen (TYLENOL) 325 MG tablet Take 650 mg by mouth every 6 (six) hours as needed for mild pain or headache.    . ALPRAZolam (XANAX) 0.25 MG tablet TAKE 1 TABLET BY MOUTH 3 TIMES DAILY AS NEEDED FOR ANXIETY 90 tablet 3  . CALCIUM PO Take 5 mLs by mouth daily.    . carbamazepine (TEGRETOL) 100 MG chewable tablet CHEW 1 TABLET BY MOUTH 2 TIMES DAILY. 60 tablet 2  . Cyanocobalamin (B-12 PO) Take 5 mLs by mouth as needed (  low b12).    . Echinacea 500 MG CAPS Take by mouth.    . fluconazole (DIFLUCAN) 150 MG tablet 1 tab by mouth every 3 days as needed 2 tablet 1  . levothyroxine (SYNTHROID, LEVOTHROID) 88 MCG tablet TAKE 1 TABLET BY MOUTH EVERY MORNING. 90 tablet 3  . Multiple Vitamins-Minerals (MULTIVITAMIN ADULT PO) Take 1 tablet by mouth daily.    Marland Kitchen omeprazole (PRILOSEC) 20 MG capsule Take 2 capsules (40 mg total) by mouth daily. 180 capsule 3  . topiramate (TOPAMAX) 50 MG  tablet Take 1 tablet by mouth daily.     No current facility-administered medications on file prior to visit.    Review of Systems Constitutional: Negative for other unusual diaphoresis, sweats, appetite or weight changes HENT: Negative for other worsening hearing loss, ear pain, facial swelling, mouth sores or neck stiffness.   Eyes: Negative for other worsening pain, redness or other visual disturbance.  Respiratory: Negative for other stridor or swelling Cardiovascular: Negative for other palpitations or other chest pain  Gastrointestinal: Negative for worsening diarrhea or loose stools, blood in stool, distention or other pain Genitourinary: Negative for hematuria, flank pain or other change in urine volume.  Musculoskeletal: Negative for myalgias or other joint swelling.  Skin: Negative for other color change, or other wound or worsening drainage.  Neurological: Negative for other syncope or numbness. Hematological: Negative for other adenopathy or swelling Psychiatric/Behavioral: Negative for hallucinations, other worsening agitation, SI, self-injury, or new decreased concentration All other system neg per pt    Objective:   Physical Exam BP 124/86   Pulse 67   Temp 97.9 F (36.6 C) (Oral)   Ht 5\' 3"  (1.6 m)   Wt 142 lb (64.4 kg)   SpO2 99%   BMI 25.15 kg/m  VS noted,  Constitutional: Pt is oriented to person, place, and time. Appears well-developed and well-nourished, in no significant distress and comfortable Head: Normocephalic and atraumatic  Eyes: Conjunctivae and EOM are normal. Pupils are equal, round, and reactive to light Right Ear: External ear normal without discharge Left Ear: External ear normal without discharge Nose: Nose without discharge or deformity Mouth/Throat: Oropharynx is without other ulcerations and moist  Neck: Normal range of motion. Neck supple. No JVD present. No tracheal deviation present or significant neck LA or mass Cardiovascular: Normal  rate, regular rhythm, normal heart sounds and intact distal pulses.   Pulmonary/Chest: WOB normal and breath sounds without rales or wheezing  Abdominal: Soft. Bowel sounds are normal. NT. No HSM  Musculoskeletal: Normal range of motion. Exhibits no edema Lymphadenopathy: Has no other cervical adenopathy.  Neurological: Pt is alert and oriented to person, place, and time. Pt has normal reflexes. No cranial nerve deficit. Motor grossly intact, Gait intact Skin: Skin is warm and dry. No rash noted or new ulcerations Psychiatric:  Has normal mood and affect. Behavior is normal without agitation No other exam findings Lab Results  Component Value Date   WBC 5.0 08/20/2016   HGB 12.8 08/20/2016   HCT 38.5 08/20/2016   PLT 307.0 08/20/2016   GLUCOSE 99 08/20/2016   CHOL 214 (H) 08/20/2016   TRIG 71.0 08/20/2016   HDL 110.20 08/20/2016   LDLDIRECT 94.8 04/29/2012   LDLCALC 90 08/20/2016   ALT 16 08/20/2016   AST 17 08/20/2016   NA 141 08/20/2016   K 4.2 08/20/2016   CL 107 08/20/2016   CREATININE 0.58 08/20/2016   BUN 11 08/20/2016   CO2 28 08/20/2016   TSH 0.81  08/20/2016       Assessment & Plan:

## 2016-11-16 MED FILL — carBAMazepine ER 100 MG TB1: 100 | 30 days supply | Qty: 120 | Fill #4

## 2016-12-03 MED FILL — ALPRAZolam 0.25 MG TABS: 0.25 | 30 days supply | Qty: 90 | Fill #3

## 2016-12-13 MED FILL — carBAMazepine ER 100 MG TB1: 100 | 30 days supply | Qty: 120 | Fill #5

## 2016-12-13 MED FILL — TOPIRAMATE 50 MG TABLET: 50 | 30 days supply | Qty: 30 | Fill #5

## 2016-12-28 ENCOUNTER — Other Ambulatory Visit: Payer: Self-pay | Admitting: Internal Medicine

## 2016-12-28 DIAGNOSIS — F439 Reaction to severe stress, unspecified: Secondary | ICD-10-CM

## 2016-12-28 NOTE — Telephone Encounter (Signed)
Done erx 

## 2016-12-31 MED FILL — ALPRAZolam 0.25 MG TABS: 0.25 | 30 days supply | Qty: 90 | Fill #0

## 2017-01-13 MED FILL — TOPIRAMATE 50 MG TABLET: 50 | 30 days supply | Qty: 30 | Fill #6

## 2017-01-13 MED FILL — LEVOTHYROXINE 88 MCG TABLET: 88 | 90 days supply | Qty: 90 | Fill #1

## 2017-01-14 MED FILL — carBAMazepine ER 100 MG TB1: 100 | 30 days supply | Qty: 120 | Fill #6

## 2017-01-27 MED FILL — ALPRAZolam 0.25 MG TABS: 0.25 | 30 days supply | Qty: 90 | Fill #1

## 2017-01-27 MED FILL — OMEPRAZOLE 20 MG CAP: 20 | 90 days supply | Qty: 180 | Fill #1

## 2017-01-27 MED FILL — traMADol HCL 50 MG TABS: 50 | 22 days supply | Qty: 90 | Fill #0

## 2017-02-08 MED FILL — carBAMazepine ER 100 MG TB1: 100 | 30 days supply | Qty: 120 | Fill #7

## 2017-02-08 MED FILL — TOPIRAMATE 50 MG TABLET: 50 | 30 days supply | Qty: 30 | Fill #7

## 2017-03-04 ENCOUNTER — Other Ambulatory Visit: Payer: Self-pay | Admitting: Women's Health

## 2017-03-04 DIAGNOSIS — Z1231 Encounter for screening mammogram for malignant neoplasm of breast: Secondary | ICD-10-CM

## 2017-03-13 MED FILL — TOPIRAMATE 50 MG TABLET: 50 | 30 days supply | Qty: 30 | Fill #8

## 2017-03-13 MED FILL — ALPRAZolam 0.25 MG TABS: 0.25 | 30 days supply | Qty: 90 | Fill #2

## 2017-03-13 MED FILL — carBAMazepine ER 100 MG TB1: 100 | 30 days supply | Qty: 120 | Fill #8

## 2017-04-09 MED FILL — carBAMazepine ER 100 MG TB1: 100 | 30 days supply | Qty: 120 | Fill #9

## 2017-04-09 MED FILL — TOPIRAMATE 50 MG TABLET: 50 | 30 days supply | Qty: 30 | Fill #9

## 2017-04-09 MED FILL — ALPRAZolam 0.25 MG TABS: 0.25 | 30 days supply | Qty: 90 | Fill #3

## 2017-04-09 MED FILL — LEVOTHYROXINE 88 MCG TABLET: 88 | 90 days supply | Qty: 90 | Fill #2

## 2017-05-10 ENCOUNTER — Other Ambulatory Visit: Payer: Self-pay | Admitting: Internal Medicine

## 2017-05-10 DIAGNOSIS — F439 Reaction to severe stress, unspecified: Secondary | ICD-10-CM

## 2017-05-10 MED FILL — TOPIRAMATE 50 MG TABLET: 50 | 30 days supply | Qty: 30 | Fill #10

## 2017-05-10 MED FILL — ALPRAZolam 0.25 MG TABS: 0.25 | 30 days supply | Qty: 90 | Fill #0

## 2017-05-10 NOTE — Telephone Encounter (Signed)
Done erx 

## 2017-05-10 NOTE — Telephone Encounter (Signed)
04/09/2017 90# 

## 2017-05-18 MED FILL — carBAMazepine ER 100 MG TB1: 100 | 30 days supply | Qty: 120 | Fill #10

## 2017-05-21 MED FILL — OMEPRAZOLE 20 MG CAP: 20 | 90 days supply | Qty: 180 | Fill #2

## 2017-06-11 MED FILL — carBAMazepine ER 100 MG TB1: 100 | 30 days supply | Qty: 120 | Fill #11

## 2017-06-11 MED FILL — TOPIRAMATE 50 MG TABLET: 50 | 30 days supply | Qty: 30 | Fill #11

## 2017-06-11 MED FILL — ALPRAZolam 0.25 MG TABS: 0.25 | 30 days supply | Qty: 90 | Fill #1

## 2017-07-07 MED FILL — TOPIRAMATE 50 MG TABLET: 50 | 30 days supply | Qty: 30 | Fill #0

## 2017-07-07 MED FILL — carBAMazepine ER 100 MG TB1: 100 | 30 days supply | Qty: 120 | Fill #0

## 2017-07-08 ENCOUNTER — Ambulatory Visit
Admission: RE | Admit: 2017-07-08 | Discharge: 2017-07-08 | Disposition: A | Discharge status: Home / Self Care | Payer: Managed Care, Other (non HMO) | Source: Ambulatory Visit | Attending: Women's Health | Admitting: Women's Health

## 2017-07-08 DIAGNOSIS — Z1231 Encounter for screening mammogram for malignant neoplasm of breast: Secondary | ICD-10-CM

## 2017-07-11 ENCOUNTER — Encounter (INDEPENDENT_AMBULATORY_CARE_PROVIDER_SITE_OTHER): Payer: Self-pay

## 2017-07-13 MED FILL — LEVOTHYROXINE 88 MCG TABLET: 88 | 90 days supply | Qty: 90 | Fill #3

## 2017-07-13 MED FILL — ALPRAZolam 0.25 MG TABS: 0.25 | 30 days supply | Qty: 90 | Fill #2

## 2017-07-14 ENCOUNTER — Encounter: Payer: Self-pay | Admitting: Women's Health

## 2017-07-14 ENCOUNTER — Ambulatory Visit: Payer: Managed Care, Other (non HMO) | Admitting: Women's Health

## 2017-07-14 VITALS — BP 110/80 | Ht 63.0 in | Wt 152.0 lb

## 2017-07-14 DIAGNOSIS — E038 Other specified hypothyroidism: Secondary | ICD-10-CM

## 2017-07-14 DIAGNOSIS — F3289 Other specified depressive episodes: Secondary | ICD-10-CM

## 2017-07-14 DIAGNOSIS — Z01419 Encounter for gynecological examination (general) (routine) without abnormal findings: Secondary | ICD-10-CM

## 2017-07-14 MED ORDER — ESCITALOPRAM OXALATE 10 MG PO TABS: 10.0000 mg | ORAL_TABLET | Freq: Every day | ORAL | 1 refills | Status: DC

## 2017-07-14 MED FILL — ESCITALOPRAM 10 MG TABLET: 10 | 90 days supply | Qty: 90 | Fill #0

## 2017-07-14 MED FILL — traMADol HCL 50 MG TABS: 50 | 22 days supply | Qty: 90 | Fill #0

## 2017-07-14 NOTE — Patient Instructions (Addendum)
Health Maintenance for Postmenopausal Women Menopause is a normal process in which your reproductive ability comes to an end. This process happens gradually over a span of months to years, usually between the ages of 22 and 9. Menopause is complete when you have missed 12 consecutive menstrual periods. It is important to talk with your health care provider about some of the most common conditions that affect postmenopausal women, such as heart disease, cancer, and bone loss (osteoporosis). Adopting a healthy lifestyle and getting preventive care can help to promote your health and wellness. Those actions can also lower your chances of developing some of these common conditions. What should I know about menopause? During menopause, you may experience a number of symptoms, such as:  Moderate-to-severe hot flashes.  Night sweats.  Decrease in sex drive.  Mood swings.  Headaches.  Tiredness.  Irritability.  Memory problems.  Insomnia.  Choosing to treat or not to treat menopausal changes is an individual decision that you make with your health care provider. What should I know about hormone replacement therapy and supplements? Hormone therapy products are effective for treating symptoms that are associated with menopause, such as hot flashes and night sweats. Hormone replacement carries certain risks, especially as you become older. If you are thinking about using estrogen or estrogen with progestin treatments, discuss the benefits and risks with your health care provider. What should I know about heart disease and stroke? Heart disease, heart attack, and stroke become more likely as you age. This may be due, in part, to the hormonal changes that your body experiences during menopause. These can affect how your body processes dietary fats, triglycerides, and cholesterol. Heart attack and stroke are both medical emergencies. There are many things that you can do to help prevent heart disease  and stroke:  Have your blood pressure checked at least every 1-2 years. High blood pressure causes heart disease and increases the risk of stroke.  If you are 53-22 years old, ask your health care provider if you should take aspirin to prevent a heart attack or a stroke.  Do not use any tobacco products, including cigarettes, chewing tobacco, or electronic cigarettes. If you need help quitting, ask your health care provider.  It is important to eat a healthy diet and maintain a healthy weight. ? Be sure to include plenty of vegetables, fruits, low-fat dairy products, and lean protein. ? Avoid eating foods that are high in solid fats, added sugars, or salt (sodium).  Get regular exercise. This is one of the most important things that you can do for your health. ? Try to exercise for at least 150 minutes each week. The type of exercise that you do should increase your heart rate and make you sweat. This is known as moderate-intensity exercise. ? Try to do strengthening exercises at least twice each week. Do these in addition to the moderate-intensity exercise.  Know your numbers.Ask your health care provider to check your cholesterol and your blood glucose. Continue to have your blood tested as directed by your health care provider.  What should I know about cancer screening? There are several types of cancer. Take the following steps to reduce your risk and to catch any cancer development as early as possible. Breast Cancer  Practice breast self-awareness. ? This means understanding how your breasts normally appear and feel. ? It also means doing regular breast self-exams. Let your health care provider know about any changes, no matter how small.  If you are 40  or older, have a clinician do a breast exam (clinical breast exam or CBE) every year. Depending on your age, family history, and medical history, it may be recommended that you also have a yearly breast X-ray (mammogram).  If you  have a family history of breast cancer, talk with your health care provider about genetic screening.  If you are at high risk for breast cancer, talk with your health care provider about having an MRI and a mammogram every year.  Breast cancer (BRCA) gene test is recommended for women who have family members with BRCA-related cancers. Results of the assessment will determine the need for genetic counseling and BRCA1 and for BRCA2 testing. BRCA-related cancers include these types: ? Breast. This occurs in males or females. ? Ovarian. ? Tubal. This may also be called fallopian tube cancer. ? Cancer of the abdominal or pelvic lining (peritoneal cancer). ? Prostate. ? Pancreatic.  Cervical, Uterine, and Ovarian Cancer Your health care provider may recommend that you be screened regularly for cancer of the pelvic organs. These include your ovaries, uterus, and vagina. This screening involves a pelvic exam, which includes checking for microscopic changes to the surface of your cervix (Pap test).  For women ages 21-65, health care providers may recommend a pelvic exam and a Pap test every three years. For women ages 79-65, they may recommend the Pap test and pelvic exam, combined with testing for human papilloma virus (HPV), every five years. Some types of HPV increase your risk of cervical cancer. Testing for HPV may also be done on women of any age who have unclear Pap test results.  Other health care providers may not recommend any screening for nonpregnant women who are considered low risk for pelvic cancer and have no symptoms. Ask your health care provider if a screening pelvic exam is right for you.  If you have had past treatment for cervical cancer or a condition that could lead to cancer, you need Pap tests and screening for cancer for at least 20 years after your treatment. If Pap tests have been discontinued for you, your risk factors (such as having a new sexual partner) need to be  reassessed to determine if you should start having screenings again. Some women have medical problems that increase the chance of getting cervical cancer. In these cases, your health care provider may recommend that you have screening and Pap tests more often.  If you have a family history of uterine cancer or ovarian cancer, talk with your health care provider about genetic screening.  If you have vaginal bleeding after reaching menopause, tell your health care provider.  There are currently no reliable tests available to screen for ovarian cancer.  Lung Cancer Lung cancer screening is recommended for adults 69-62 years old who are at high risk for lung cancer because of a history of smoking. A yearly low-dose CT scan of the lungs is recommended if you:  Currently smoke.  Have a history of at least 30 pack-years of smoking and you currently smoke or have quit within the past 15 years. A pack-year is smoking an average of one pack of cigarettes per day for one year.  Yearly screening should:  Continue until it has been 15 years since you quit.  Stop if you develop a health problem that would prevent you from having lung cancer treatment.  Colorectal Cancer  This type of cancer can be detected and can often be prevented.  Routine colorectal cancer screening usually begins at  age 42 and continues through age 45.  If you have risk factors for colon cancer, your health care provider may recommend that you be screened at an earlier age.  If you have a family history of colorectal cancer, talk with your health care provider about genetic screening.  Your health care provider may also recommend using home test kits to check for hidden blood in your stool.  A small camera at the end of a tube can be used to examine your colon directly (sigmoidoscopy or colonoscopy). This is done to check for the earliest forms of colorectal cancer.  Direct examination of the colon should be repeated every  5-10 years until age 71. However, if early forms of precancerous polyps or small growths are found or if you have a family history or genetic risk for colorectal cancer, you may need to be screened more often.  Skin Cancer  Check your skin from head to toe regularly.  Monitor any moles. Be sure to tell your health care provider: ? About any new moles or changes in moles, especially if there is a change in a mole's shape or color. ? If you have a mole that is larger than the size of a pencil eraser.  If any of your family members has a history of skin cancer, especially at a young age, talk with your health care provider about genetic screening.  Always use sunscreen. Apply sunscreen liberally and repeatedly throughout the day.  Whenever you are outside, protect yourself by wearing long sleeves, pants, a wide-brimmed hat, and sunglasses.  What should I know about osteoporosis? Osteoporosis is a condition in which bone destruction happens more quickly than new bone creation. After menopause, you may be at an increased risk for osteoporosis. To help prevent osteoporosis or the bone fractures that can happen because of osteoporosis, the following is recommended:  If you are 46-71 years old, get at least 1,000 mg of calcium and at least 600 mg of vitamin D per day.  If you are older than age 55 but younger than age 65, get at least 1,200 mg of calcium and at least 600 mg of vitamin D per day.  If you are older than age 54, get at least 1,200 mg of calcium and at least 800 mg of vitamin D per day.  Smoking and excessive alcohol intake increase the risk of osteoporosis. Eat foods that are rich in calcium and vitamin D, and do weight-bearing exercises several times each week as directed by your health care provider. What should I know about how menopause affects my mental health? Depression may occur at any age, but it is more common as you become older. Common symptoms of depression  include:  Low or sad mood.  Changes in sleep patterns.  Changes in appetite or eating patterns.  Feeling an overall lack of motivation or enjoyment of activities that you previously enjoyed.  Frequent crying spells.  Talk with your health care provider if you think that you are experiencing depression. What should I know about immunizations? It is important that you get and maintain your immunizations. These include:  Tetanus, diphtheria, and pertussis (Tdap) booster vaccine.  Influenza every year before the flu season begins.  Pneumonia vaccine.  Shingles vaccine.  Your health care provider may also recommend other immunizations. This information is not intended to replace advice given to you by your health care provider. Make sure you discuss any questions you have with your health care provider. Document Released: 03/06/2005  Document Revised: 08/02/2015 Document Reviewed: 10/16/2014 Elsevier Interactive Patient Education  2018 [employer].  Persistent Depressive Disorder Persistent depressive disorder (PDD) is a mental health condition. PDD causes symptoms of low-level depression for 2 years or longer. It may also be called long-term (chronic) depression or dysthymia. PDD may include episodes of more severe depression that last for about 2 weeks (major depressive disorder or MDD). PDD can affect the way you think, feel, and sleep. This condition may also affect your relationships. You may be more likely to get sick if you have PDD. Symptoms of PDD occur for most of the day and may include:  Feeling tired (fatigue).  Low energy.  Eating too much or too little.  Sleeping too much or too little.  Feeling restless or agitated.  Feeling hopeless.  Feeling worthless or guilty.  Feeling worried or nervous (anxiety).  Trouble concentrating or making decisions.  Low self-esteem.  A negative way of looking at things (outlook).  Not being able to have fun or feel  pleasure.  Avoiding interacting with people.  Getting angry or annoyed easily (irritability).  Acting aggressive or angry.  Follow these instructions at home: Activity  Go back to your normal activities as told by your doctor.  Exercise regularly as told by your doctor. General instructions  Take over-the-counter and prescription medicines only as told by your doctor.  Do not drink alcohol. Or, limit how much alcohol you drink to no more than 1 drink a day for nonpregnant women and 2 drinks a day for men. One drink equals 12 oz of beer, 5 oz of wine, or 1 oz of hard liquor. Alcohol can affect any antidepressant medicines you are taking. Talk with your doctor about your alcohol use.  Eat a healthy diet and get plenty of sleep.  Find activities that you enjoy each day.  Consider joining a support group. Your doctor may be able to suggest a support group.  Keep all follow-up visits as told by your doctor. This is important. Where to find more information: [employer] on Mental Illness  www.nami.org  [place] National Institute of Mental Health  https://carter.com/  National Suicide Prevention Lifeline  630-111-2851 904-827-1223). This is free, 24-hour help.  Contact a doctor if:  Your symptoms get worse.  You have new symptoms.  You have trouble sleeping or doing your daily activities. Get help right away if:  You self-harm.  You have serious thoughts about hurting yourself or others.  You see, hear, taste, smell, or feel things that are not there (hallucinate). This information is not intended to replace advice given to you by your health care provider. Make sure you discuss any questions you have with your health care provider. Document Released: 12/24/2014 Document Revised: 09/06/2015 Document Reviewed: 09/06/2015 Elsevier Interactive Patient Education  2017 [employer].

## 2017-07-14 NOTE — Progress Notes (Signed)
Kelli Bell 12-03-1960 [id number]    History:    Presents for annual exam.  2004 TAH with BSO for endometriosis on no HRT.  Normal Pap and mammogram history.   2012- colonoscopy.  2015 gastric bypass 60 pound weight loss maintained.  2014 normal DEXA at [facility].  Continues to be tearful, husband died Sep 07, 2015 and is struggling.  In the process of moving, and selling her large home.  Has continued in grief counseling. Primary care manages hypothyroidism.  Past medical history, past surgical history, family history and social history were all reviewed and documented in the EPIC chart.  2 children both doing well, good support system with parents and family.  ROS:  A ROS was performed and pertinent positives and negatives are included.  Exam:  Vitals:   07/14/17 0929  BP: 110/80  Weight: 152 lb (68.9 kg)  Height: 5\' 3"  (1.6 m)   Body mass index is 26.93 kg/m.   General appearance:  Normal Thyroid:  Symmetrical, normal in size, without palpable masses or nodularity. Respiratory  Auscultation:  Clear without wheezing or rhonchi Cardiovascular  Auscultation:  Regular rate, without rubs, murmurs or gallops  Edema/varicosities:  Not grossly evident Abdominal  Soft,nontender, without masses, guarding or rebound.  Liver/spleen:  No organomegaly noted  Hernia:  None appreciated  Skin  Inspection:  Grossly normal   Breasts: Examined lying and sitting.     Right: Without masses, retractions, discharge or axillary adenopathy.     Left: Without masses, retractions, discharge or axillary adenopathy. Gentitourinary   Inguinal/mons:  Normal without inguinal adenopathy  External genitalia:  Normal  BUS/Urethra/Skene's glands:  Normal  Vagina:  Normal  Cervix: And uterus absent  Adnexa/parametria:     Rt: Without masses or tenderness.   Lt: Without masses or tenderness.  Anus and perineum: Normal  Digital rectal exam: Normal sphincter tone without palpated masses or  tenderness  Assessment/Plan:  57 y.o. WWF G2, P2 for annual exam with situational depression/grief.  2004 TAH with BSO no HRT Hypothyroid-primary care manages labs and meds 2015 gastric bypass Depression/grief  Plan: Reviewed importance of continuing with therapy/counseling, leisure activities.  Options reviewed, would like to try medication, will try Lexapro 10 mg start with half tablet increase to full tablet in 2 weeks.  Reviewed importance of self-care, counseling, call if no relief or continued daily sadness.  Encouraged to discuss medicine with therapist and with primary care at annual exam in October.  SBE's, continue annual screening mammogram, calcium rich foods, vitamin D 1000 daily encouraged.  Home safety, fall prevention and importance of weightbearing exercise reviewed.  Turner, 11:41 AM 07/14/2017

## 2017-07-15 ENCOUNTER — Encounter (INDEPENDENT_AMBULATORY_CARE_PROVIDER_SITE_OTHER): Payer: Self-pay

## 2017-07-15 LAB — TSH: TSH: 1.14 mIU/L (ref 0.40–4.50)

## 2017-07-16 LAB — URINALYSIS, COMPLETE W/RFL CULTURE
Bacteria, UA: NONE SEEN /HPF
Bilirubin Urine: NEGATIVE
Glucose, UA: NEGATIVE
Hgb urine dipstick: NEGATIVE
Hyaline Cast: NONE SEEN /LPF
Ketones, ur: NEGATIVE
Leukocyte Esterase: NEGATIVE
Nitrites, Initial: NEGATIVE
Protein, ur: NEGATIVE
Specific Gravity, Urine: 1.021 (ref 1.001–1.03)
WBC, UA: NONE SEEN /HPF (ref 0–5)
pH: <=5 (ref 5.0–8.0)

## 2017-07-16 LAB — URINE CULTURE
MICRO NUMBER:: 90739105
SPECIMEN QUALITY:: ADEQUATE

## 2017-07-16 LAB — CULTURE INDICATED

## 2017-07-30 MED FILL — OMEPRAZOLE 20 MG CAP: 20 | 90 days supply | Qty: 180 | Fill #3

## 2017-08-13 ENCOUNTER — Other Ambulatory Visit (INDEPENDENT_AMBULATORY_CARE_PROVIDER_SITE_OTHER): Payer: Managed Care, Other (non HMO)

## 2017-08-13 ENCOUNTER — Ambulatory Visit: Payer: Managed Care, Other (non HMO) | Admitting: Family

## 2017-08-13 ENCOUNTER — Encounter: Payer: Self-pay | Admitting: Family

## 2017-08-13 ENCOUNTER — Ambulatory Visit: Payer: Self-pay | Admitting: Internal Medicine

## 2017-08-13 VITALS — BP 106/60 | HR 55 | Temp 98.3°F | Ht 63.0 in | Wt 146.0 lb

## 2017-08-13 DIAGNOSIS — E162 Hypoglycemia, unspecified: Secondary | ICD-10-CM

## 2017-08-13 DIAGNOSIS — R739 Hyperglycemia, unspecified: Secondary | ICD-10-CM

## 2017-08-13 LAB — URINALYSIS, ROUTINE W REFLEX MICROSCOPIC
Bilirubin Urine: NEGATIVE
Hgb urine dipstick: NEGATIVE
Nitrite: NEGATIVE
Specific Gravity, Urine: 1.025 (ref 1.000–1.030)
Total Protein, Urine: NEGATIVE
Urine Glucose: NEGATIVE
Urobilinogen, UA: 0.2 (ref 0.0–1.0)
pH: 5.5 (ref 5.0–8.0)

## 2017-08-13 LAB — COMPREHENSIVE METABOLIC PANEL
ALT: 9 U/L (ref 0–35)
AST: 10 U/L (ref 0–37)
Albumin: 4.2 g/dL (ref 3.5–5.2)
Alkaline Phosphatase: 97 U/L (ref 39–117)
BUN: 11 mg/dL (ref 6–23)
CO2: 30 mEq/L (ref 19–32)
Calcium: 8.7 mg/dL (ref 8.4–10.5)
Chloride: 106 mEq/L (ref 96–112)
Creatinine, Ser: 0.7 mg/dL (ref 0.40–1.20)
GFR: 91.45 mL/min (ref >60.00)
Glucose, Bld: 91 mg/dL (ref 70–99)
Potassium: 4.3 mEq/L (ref 3.5–5.1)
Sodium: 141 mEq/L (ref 135–145)
Total Bilirubin: 0.3 mg/dL (ref 0.2–1.2)
Total Protein: 6.5 g/dL (ref 6.0–8.3)

## 2017-08-13 LAB — CBC WITH DIFFERENTIAL/PLATELET
Basophils Absolute: 0.1 10*3/uL (ref 0.0–0.1)
Basophils Relative: 2.1 % (ref 0.0–3.0)
Eosinophils Absolute: 0.1 10*3/uL (ref 0.0–0.7)
Eosinophils Relative: 1.6 % (ref 0.0–5.0)
HCT: 39 % (ref 36.0–46.0)
Hemoglobin: 13.2 g/dL (ref 12.0–15.0)
Lymphocytes Relative: 52.9 % — ABNORMAL HIGH (ref 12.0–46.0)
Lymphs Abs: 1.8 10*3/uL (ref 0.7–4.0)
MCHC: 33.7 g/dL (ref 30.0–36.0)
MCV: 93.1 fl (ref 78.0–100.0)
Monocytes Absolute: 0.4 10*3/uL (ref 0.1–1.0)
Monocytes Relative: 10.8 % (ref 3.0–12.0)
Neutro Abs: 1.1 10*3/uL — ABNORMAL LOW (ref 1.4–7.7)
Neutrophils Relative %: 32.6 % — ABNORMAL LOW (ref 43.0–77.0)
Platelets: 320 10*3/uL (ref 150.0–400.0)
RBC: 4.19 Mil/uL (ref 3.87–5.11)
RDW: 12.7 % (ref 11.5–15.5)
WBC: 3.4 10*3/uL — ABNORMAL LOW (ref 4.0–10.5)

## 2017-08-13 LAB — POCT GLUCOSE (DEVICE FOR HOME USE): POC Glucose: 116 mg/dl — AB (ref 70–99)

## 2017-08-13 LAB — HEMOGLOBIN A1C: Hgb A1c MFr Bld: 5.4 % (ref 4.6–6.5)

## 2017-08-13 NOTE — Telephone Encounter (Signed)
Patient seen in office today. 

## 2017-08-13 NOTE — Telephone Encounter (Signed)
Called in c/o glucose 32 yesterday with sweating and weakness.   Ate some candy and felt better.   See triage notes.  I made her an appt with Jodi Mourning, NP for today at 2:20.   Reason for Disposition . [1] Blood glucose < 70  mg/dL (3.9 mmol/L) or symptomatic, now improved with Care Advice AND [2] cause unknown  Answer Assessment - Initial Assessment Questions 1. SYMPTOMS: "What symptoms are you concerned about?"     I'm moving.  Packing boxes.   I started sweating and felt light headed.   My mother is a diabetic.    She checked my sugar and it was 32.   2 hours later it was 64 after I ate some candy.   It came up to 96 after that.   Last night it was 185. 2. ONSET:  "When did the symptoms start?"     Yesterday.   I had gastric bypass in 2007.   I have problems with my glucose going low.   This moving has put me under stress.  I know what some of the symptoms are.   I'm not dizzy or sweating this morning.   I have a physical planned in Oct.    I forget to eat sometimes so I get dizzy when I don't eat. 3. BLOOD GLUCOSE: "What is your blood glucose level?"      Not checked 4. USUAL RANGE: "What is your blood glucose level usually?" (e.g., usual fasting morning value, usual evening value)     I don't usually check it.   I'm not diabetic.   I used my mother's meter. 5. TYPE 1 or 2:  "Do you know what type of diabetes you have?"  (e.g., Type 1, Type 2, Gestational; doesn't know)      Not diabetic.   Had gastric bypass and have problems with low glucose if I don't eat often. 6. INSULIN: "Do you take insulin?" "What type of insulin(s) do you use? What is the mode of delivery? (syringe, pen (e.g., injection or  pump)      No 7. DIABETES PILLS: "Do you take any pills for your diabetes?"     No 8. OTHER SYMPTOMS: "Do you have any symptoms?" (e.g., fever, frequent urination, difficulty breathing, vomiting)     No 9. LOW BLOOD GLUCOSE TREATMENT: "What have you done so far to treat the low blood  glucose level?"     It happened yesterday.   I ate some candy. 10. FOOD: "When did you last eat or drink?"       It happened yesterday. 11. ALONE: "Are you alone right now or is someone with you?"        N/A 12. PREGNANCY: "Is there any chance you are pregnant?" "When was your last menstrual period?"       No hysterectomy  Protocols used: DIABETES - LOW BLOOD SUGAR-A-AH

## 2017-08-13 NOTE — Progress Notes (Signed)
Kelli Bell is a 57 y.o. female with the following history as recorded in [employer]:  Patient Active Problem List   Diagnosis Date Noted  . Dizziness 08/22/2016  . Grief reaction 10/31/2015  . Trigeminal neuralgia of left side of face 05/10/2014  . Acute sinus infection 12/27/2013  . Allergic rhinitis 12/27/2013  . Nausea 10/02/2013  . Preop exam for internal medicine 09/05/2013  . Morbid obesity, weight - 206, BMI - 36.6 04/12/2013  . Postmenopausal hormone replacement therapy 05/22/2011  . Preventative health care 07/21/2010  . BACK PAIN 02/20/2009  . THORACIC/LUMBOSACRAL NEURITIS/RADICULITIS UNSPEC 02/23/2008  . Hyperlipidemia 02/22/2008  . VERTIGO 11/23/2007  . OBSTRUCTIVE SLEEP APNEA 06/29/2007  . FATIGUE 06/29/2007  . Hypothyroidism 03/30/2007  . Anxiety state 03/30/2007  . Depression 03/30/2007  . GERD 01/13/2007    Current Outpatient Medications  Medication Sig Dispense Refill  . acetaminophen (TYLENOL) 325 MG tablet Take 650 mg by mouth every 6 (six) hours as needed for mild pain or headache.    . ALPRAZolam (XANAX) 0.25 MG tablet TAKE 1 TABLET BY MOUTH 3 TIMES DAILY AS NEEDED FOR ANXIETY 90 tablet 3  . CALCIUM PO Take 5 mLs by mouth daily.    . carbamazepine (TEGRETOL) 100 MG chewable tablet CHEW 1 TABLET BY MOUTH 2 TIMES DAILY. 60 tablet 2  . Cyanocobalamin (B-12 PO) Take 5 mLs by mouth as needed (low b12).    . escitalopram (LEXAPRO) 10 MG tablet Take 1 tablet (10 mg total) by mouth daily. 90 tablet 1  . levothyroxine (SYNTHROID, LEVOTHROID) 88 MCG tablet TAKE 1 TABLET BY MOUTH EVERY MORNING. 90 tablet 3  . Multiple Vitamins-Minerals (MULTIVITAMIN ADULT PO) Take 1 tablet by mouth daily.    Marland Kitchen omeprazole (PRILOSEC) 20 MG capsule Take 2 capsules (40 mg total) by mouth daily. 180 capsule 3  . topiramate (TOPAMAX) 50 MG tablet Take 1 tablet by mouth daily.    . traMADol (ULTRAM) 50 MG tablet Take 1 tablet (50 mg total) by mouth every 6 (six) hours as needed. 60 tablet 3    No current facility-administered medications for this visit.     Allergies: Codeine  Past Medical History:  Diagnosis Date  . ANXIETY 03/30/2007  . BACK PAIN 02/20/2009  . Essential hypertension, benign 05/31/2012  . Family history of anesthesia complication    "mother has hard time waking up"  . GERD 01/13/2007  . H/O hiatal hernia   . HYPERLIPIDEMIA 02/22/2008  . HYPOTHYROIDISM 03/30/2007  . MENOPAUSAL DISORDER 02/23/2008  . Nocturia   . OBSTRUCTIVE SLEEP APNEA 06/29/2007   has not used c pap x 8 months due to wt loss  . SINUSITIS- ACUTE-NOS 03/30/2007  . THORACIC/LUMBOSACRAL NEURITIS/RADICULITIS UNSPEC 02/23/2008  . Wheezing 01/08/2009    Past Surgical History:  Procedure Laterality Date  . ABDOMINAL HYSTERECTOMY  2004   with BSO  . BREATH TEK H PYLORI N/A 05/05/2013   Procedure: BREATH TEK H PYLORI;  Surgeon: Shann Medal, MD;  Location: [facility];  Service: General;  Laterality: N/A;  . CHOLECYSTECTOMY  1980  . GASTRIC ROUX-EN-Y N/A 09/12/2013   Procedure: LAPAROSCOPIC ROUX-EN-Y GASTRIC BYPASS WITH UPPER ENDOSCOPY WITH ENTEROLYSIS OF ADHESIONS;  Surgeon: Shann Medal, MD;  Location: [facility];  Service: General;  Laterality: N/A;  . jaw bone surgury     57 yo  . TONSILLECTOMY      Family History  Problem Relation Age of Onset  . Stroke Mother   . Hypertension Mother   . Diabetes  Mother   . Cancer Mother        basel cell cancer  . Thyroid disease Mother   . Arthritis Other   . Colon polyps Father 39       "CANCEROUS POLYPS REMOVED THIS YEAR"  . Asthma Brother   . Thyroid disease Maternal Grandmother   . Lymphoma Maternal Grandmother   . Osteoporosis Other   . Breast cancer Maternal Aunt        mat great aunt  . Diabetes Paternal Grandmother   . Heart disease Paternal Grandmother   . Colon cancer Neg Hx     Social History   Tobacco Use  . Smoking status: Former Smoker    Last attempt to quit: 07/06/1997    Years since quitting: 20.1  . Smokeless tobacco: Never  Used  Substance Use Topics  . Alcohol use: Yes    Comment: SOCAILLY,MAYBE MONTHLY WINE    Subjective:  Patient had an episode of hypoglycemia yesterday about an hour after eating dinner- ate chicken sandwich, french fries, sweet tea; blood sugar was at 32; started eating chocolate candy and peanut butter cracker- sugar had gone up to 64; by the time, she went to bed last night, blood sugar was up to 185; admits that yesterday, she had gone all day without eating; in the process of moving/ down-sizing, admits that stress level is very high; Denies any chest pain, shortness of breath, blurred vision.  Today in the office- not fasting- glucose is at 116; TSH was recently checked by GYN- normal;   Objective:  Vitals:   08/13/17 1433  BP: 106/60  Pulse: (!) 55  Temp: 98.3 F (36.8 C)  TempSrc: Oral  SpO2: 97%  Weight: 146 lb (66.2 kg)  Height: 5' 3" (1.6 m)    General: Well developed, well nourished, in no acute distress  Skin : Warm and dry.  Head: Normocephalic and atraumatic  Eyes: Sclera and conjunctiva clear; pupils round and reactive to light; extraocular movements intact  Ears: External normal; canals clear; tympanic membranes normal  Oropharynx: Pink, supple. No suspicious lesions  Neck: Supple without thyromegaly, adenopathy  Lungs: Respirations unlabored; clear to auscultation bilaterally without wheeze, rales, rhonchi  CVS exam: normal rate and regular rhythm.  Abdomen: Soft; nontender; nondistended; normoactive bowel sounds; no masses or hepatosplenomegaly  Musculoskeletal: No deformities; no active joint inflammation  Extremities: No edema, cyanosis, clubbing  Vessels: Symmetric bilaterally  Neurologic: Alert and oriented; speech intact; face symmetrical; moves all extremities well; CNII-XII intact without focal deficit   Assessment:  1. Hypoglycemia   2. Hyperglycemia     Plan:  Suspect episode is isolated even as she had not eaten much yesterday; will update labs  today; encouraged to eat small meals throughout the day/ make sure she is getting enough protein; follow-up to be determined.  No follow-ups on file.  Orders Placed This Encounter  Procedures  . CBC w/Diff    Standing Status:   Future    Number of Occurrences:   1    Standing Expiration Date:   08/13/2018  . Comp Met (CMET)    Standing Status:   Future    Number of Occurrences:   1    Standing Expiration Date:   08/13/2018  . HgB A1c    Standing Status:   Future    Number of Occurrences:   1    Standing Expiration Date:   08/13/2018  . Insulin and C-Peptide    Standing Status:  Future    Number of Occurrences:   1    Standing Expiration Date:   08/14/2018  . Urinalysis    Standing Status:   Future    Number of Occurrences:   1    Standing Expiration Date:   08/13/2018  . POCT Glucose (Device for Home Use)    Requested Prescriptions    No prescriptions requested or ordered in this encounter

## 2017-08-14 LAB — INSULIN AND C-PEPTIDE, SERUM
C-Peptide: 1.9 ng/mL (ref 1.1–4.4)
INSULIN: 3.1 u[IU]/mL (ref 2.6–24.9)

## 2017-08-17 MED FILL — carBAMazepine ER 100 MG TB1: 100 | 30 days supply | Qty: 120 | Fill #1

## 2017-08-17 MED FILL — TOPIRAMATE 50 MG TABLET: 50 | 30 days supply | Qty: 30 | Fill #1

## 2017-08-25 ENCOUNTER — Other Ambulatory Visit: Payer: Self-pay | Admitting: Women's Health

## 2017-08-25 DIAGNOSIS — F3289 Other specified depressive episodes: Secondary | ICD-10-CM

## 2017-08-31 ENCOUNTER — Telehealth: Payer: Self-pay | Admitting: *Deleted

## 2017-08-31 NOTE — Telephone Encounter (Signed)
Patient takes Lexapro 10 mg tablet 1 po daily, states she started taking 2 twice daily and is working great for her anxiety, she asked if new Rx could be written for twice daily? Please advise

## 2017-09-01 ENCOUNTER — Other Ambulatory Visit: Payer: Self-pay | Admitting: Women's Health

## 2017-09-01 DIAGNOSIS — F3289 Other specified depressive episodes: Secondary | ICD-10-CM

## 2017-09-01 MED ORDER — ESCITALOPRAM OXALATE 10 MG PO TABS: ORAL_TABLET | ORAL | 3 refills | Status: DC

## 2017-09-01 NOTE — Telephone Encounter (Signed)
TC reviewed lexapro usually taken only once daily, states is doing much better with handling life with med, loss of husband. rx called in

## 2017-09-02 MED FILL — ESCITALOPRAM 10 MG TABLET: 10 | 90 days supply | Qty: 180 | Fill #0

## 2017-09-20 MED FILL — TOPIRAMATE 50 MG TABLET: 50 | 30 days supply | Qty: 30 | Fill #2

## 2017-09-28 MED FILL — carBAMazepine ER 100 MG TB1: 100 | 30 days supply | Qty: 120 | Fill #2

## 2017-10-24 MED FILL — TOPIRAMATE 50 MG TABLET: 50 | 30 days supply | Qty: 30 | Fill #3

## 2017-11-01 ENCOUNTER — Other Ambulatory Visit (INDEPENDENT_AMBULATORY_CARE_PROVIDER_SITE_OTHER): Payer: Managed Care, Other (non HMO)

## 2017-11-01 ENCOUNTER — Other Ambulatory Visit: Payer: Self-pay | Admitting: Women's Health

## 2017-11-01 DIAGNOSIS — E039 Hypothyroidism, unspecified: Secondary | ICD-10-CM

## 2017-11-01 DIAGNOSIS — Z0000 Encounter for general adult medical examination without abnormal findings: Secondary | ICD-10-CM | POA: Diagnosis not present

## 2017-11-01 LAB — BASIC METABOLIC PANEL
BUN: 13 mg/dL (ref 6–23)
CO2: 33 mEq/L — ABNORMAL HIGH (ref 19–32)
Calcium: 8.8 mg/dL (ref 8.4–10.5)
Chloride: 103 mEq/L (ref 96–112)
Creatinine, Ser: 0.6 mg/dL (ref 0.40–1.20)
GFR: 109.17 mL/min (ref >60.00)
Glucose, Bld: 96 mg/dL (ref 70–99)
Potassium: 4.6 mEq/L (ref 3.5–5.1)
Sodium: 140 mEq/L (ref 135–145)

## 2017-11-01 LAB — URINALYSIS, ROUTINE W REFLEX MICROSCOPIC
Bilirubin Urine: NEGATIVE
Hgb urine dipstick: NEGATIVE
Ketones, ur: NEGATIVE
Leukocytes, UA: NEGATIVE
Nitrite: NEGATIVE
RBC / HPF: NONE SEEN (ref 0–?)
Specific Gravity, Urine: 1.025 (ref 1.000–1.030)
Total Protein, Urine: NEGATIVE
Urine Glucose: NEGATIVE
Urobilinogen, UA: 0.2 (ref 0.0–1.0)
pH: 5.5 (ref 5.0–8.0)

## 2017-11-01 LAB — HEPATIC FUNCTION PANEL
ALT: 13 U/L (ref 0–35)
AST: 16 U/L (ref 0–37)
Albumin: 4 g/dL (ref 3.5–5.2)
Alkaline Phosphatase: 98 U/L (ref 39–117)
Bilirubin, Direct: 0 mg/dL (ref 0.0–0.3)
Total Bilirubin: 0.3 mg/dL (ref 0.2–1.2)
Total Protein: 6.4 g/dL (ref 6.0–8.3)

## 2017-11-01 LAB — CBC WITH DIFFERENTIAL/PLATELET
Basophils Absolute: 0.1 10*3/uL (ref 0.0–0.1)
Basophils Relative: 1.3 % (ref 0.0–3.0)
Eosinophils Absolute: 0.1 10*3/uL (ref 0.0–0.7)
Eosinophils Relative: 1.1 % (ref 0.0–5.0)
HCT: 37.8 % (ref 36.0–46.0)
Hemoglobin: 12.7 g/dL (ref 12.0–15.0)
Lymphocytes Relative: 34.4 % (ref 12.0–46.0)
Lymphs Abs: 1.7 10*3/uL (ref 0.7–4.0)
MCHC: 33.7 g/dL (ref 30.0–36.0)
MCV: 92.5 fl (ref 78.0–100.0)
Monocytes Absolute: 0.6 10*3/uL (ref 0.1–1.0)
Monocytes Relative: 11.4 % (ref 3.0–12.0)
Neutro Abs: 2.6 10*3/uL (ref 1.4–7.7)
Neutrophils Relative %: 51.8 % (ref 43.0–77.0)
Platelets: 291 10*3/uL (ref 150.0–400.0)
RBC: 4.08 Mil/uL (ref 3.87–5.11)
RDW: 12.7 % (ref 11.5–15.5)
WBC: 5.1 10*3/uL (ref 4.0–10.5)

## 2017-11-01 LAB — LIPID PANEL
Cholesterol: 216 mg/dL — ABNORMAL HIGH (ref 0–200)
HDL: 101.5 mg/dL (ref >39.00)
LDL Cholesterol: 100 mg/dL — ABNORMAL HIGH (ref 0–99)
NonHDL: 114.09
Total CHOL/HDL Ratio: 2
Triglycerides: 72 mg/dL (ref 0.0–149.0)
VLDL: 14.4 mg/dL (ref 0.0–40.0)

## 2017-11-01 MED FILL — LEVOTHYROXINE 88 MCG TABLET: 88 | 90 days supply | Qty: 90 | Fill #0

## 2017-11-01 MED FILL — carBAMazepine ER 100 MG TB1: 100 | 30 days supply | Qty: 120 | Fill #3

## 2017-11-01 MED FILL — traMADol HCL 50 MG TABS: 50 | 22 days supply | Qty: 90 | Fill #1

## 2017-11-01 NOTE — Telephone Encounter (Signed)
Ok for refill? 

## 2017-11-09 ENCOUNTER — Ambulatory Visit (INDEPENDENT_AMBULATORY_CARE_PROVIDER_SITE_OTHER): Payer: Managed Care, Other (non HMO) | Admitting: Internal Medicine

## 2017-11-09 ENCOUNTER — Encounter: Payer: Self-pay | Admitting: Internal Medicine

## 2017-11-09 VITALS — BP 102/64 | HR 57 | Temp 98.3°F | Ht 63.0 in | Wt 154.0 lb

## 2017-11-09 DIAGNOSIS — Z0000 Encounter for general adult medical examination without abnormal findings: Secondary | ICD-10-CM | POA: Diagnosis not present

## 2017-11-09 DIAGNOSIS — Z23 Encounter for immunization: Secondary | ICD-10-CM

## 2017-11-09 MED ORDER — ZOSTER VAC RECOMB ADJUVANTED 50 MCG/0.5ML IM SUSR: 0.5000 mL | Freq: Once | INTRAMUSCULAR | 1 refills | Status: AC

## 2017-11-09 NOTE — Patient Instructions (Addendum)
Your shingles shot was sent to the pharmacy  Please continue all other medications as before, and refills have been done if requested.  Please have the pharmacy call with any other refills you may need.  Please continue your efforts at being more active, low cholesterol diet, and weight control.  You are otherwise up to date with prevention measures today.  Please keep your appointments with your specialists as you may have planned  Please go to the LAB in the Basement (turn left off the elevator) for the tests to be done today  You will be contacted by phone if any changes need to be made immediately.  Otherwise, you will receive a letter about your results with an explanation, but please check with MyChart first.  Please remember to sign up for MyChart if you have not done so, as this will be important to you in the future with finding out test results, communicating by private email, and scheduling acute appointments online when needed.  Please return in 1 year for your yearly visit, or sooner if needed, with Lab testing done 3-5 days before

## 2017-11-09 NOTE — Progress Notes (Signed)
Subjective:    Patient ID: Kelli Bell, female    DOB: 29-Aug-1960, 57 y.o.   MRN: [id number]  HPI   Here for wellness and f/u;  Overall doing ok;  Pt denies Chest pain, worsening SOB, DOE, wheezing, orthopnea, PND, worsening LE edema, palpitations, dizziness or syncope.  Pt denies neurological change such as new headache, facial or extremity weakness.  Pt denies polydipsia, polyuria, or low sugar symptoms. Pt states overall good compliance with treatment and medications, good tolerability, and has been trying to follow appropriate diet.  Pt denies worsening depressive symptoms, suicidal ideation or panic. No fever, night sweats, wt loss, loss of appetite, or other constitutional symptoms.  Pt states good ability with ADL's, has low fall risk, home safety reviewed and adequate, no other significant changes in hearing or vision, and only occasionally active with exercise.  Moved recently with some stress, but lexapro working better than the xanax.     Past Medical History:  Diagnosis Date  . ANXIETY 03/30/2007  . BACK PAIN 02/20/2009  . Essential hypertension, benign 05/31/2012  . Family history of anesthesia complication    "mother has hard time waking up"  . GERD 01/13/2007  . H/O hiatal hernia   . HYPERLIPIDEMIA 02/22/2008  . HYPOTHYROIDISM 03/30/2007  . MENOPAUSAL DISORDER 02/23/2008  . Nocturia   . OBSTRUCTIVE SLEEP APNEA 06/29/2007   has not used c pap x 8 months due to wt loss  . SINUSITIS- ACUTE-NOS 03/30/2007  . THORACIC/LUMBOSACRAL NEURITIS/RADICULITIS UNSPEC 02/23/2008  . Wheezing 01/08/2009   Past Surgical History:  Procedure Laterality Date  . ABDOMINAL HYSTERECTOMY  2004   with BSO  . BREATH TEK H PYLORI N/A 05/05/2013   Procedure: BREATH TEK H PYLORI;  Surgeon: Shann Medal, MD;  Location: [facility];  Service: General;  Laterality: N/A;  . CHOLECYSTECTOMY  1980  . GASTRIC ROUX-EN-Y N/A 09/12/2013   Procedure: LAPAROSCOPIC ROUX-EN-Y GASTRIC BYPASS WITH UPPER ENDOSCOPY WITH  ENTEROLYSIS OF ADHESIONS;  Surgeon: Shann Medal, MD;  Location: [facility];  Service: General;  Laterality: N/A;  . jaw bone surgury     57 yo  . TONSILLECTOMY      reports that she quit smoking about 20 years ago. She has never used smokeless tobacco. She reports that she drinks alcohol. She reports that she does not use drugs. family history includes Arthritis in her other; Asthma in her brother; Breast cancer in her maternal aunt; Cancer in her mother; Colon polyps (age of onset: 48) in her father; Diabetes in her mother and paternal grandmother; Heart disease in her paternal grandmother; Hypertension in her mother; Lymphoma in her maternal grandmother; Osteoporosis in her other; Stroke in her mother; Thyroid disease in her maternal grandmother and mother. Allergies  Allergen Reactions  . Codeine Nausea Only   Current Outpatient Medications on File Prior to Visit  Medication Sig Dispense Refill  . acetaminophen (TYLENOL) 325 MG tablet Take 650 mg by mouth every 6 (six) hours as needed for mild pain or headache.    Marland Kitchen CALCIUM PO Take 5 mLs by mouth daily.    . carBAMazepine (TEGRETOL PO) Take 100 mg by mouth 2 (two) times daily.    . Cyanocobalamin (B-12 PO) Take 5 mLs by mouth as needed (low b12).    . escitalopram (LEXAPRO) 10 MG tablet Take 1 tablet twice daily (Patient taking differently: 2 (two) times daily. ) 180 tablet 3  . levothyroxine (SYNTHROID, LEVOTHROID) 88 MCG tablet TAKE 1 TABLET BY MOUTH EVERY  MORNING. 90 tablet 3  . Multiple Vitamins-Minerals (MULTIVITAMIN ADULT PO) Take 1 tablet by mouth daily.    Marland Kitchen omeprazole (PRILOSEC) 20 MG capsule Take 2 capsules (40 mg total) by mouth daily. 180 capsule 3  . topiramate (TOPAMAX) 50 MG tablet Take 1 tablet by mouth daily.    . traMADol (ULTRAM) 50 MG tablet Take 1 tablet (50 mg total) by mouth every 6 (six) hours as needed. 60 tablet 3   No current facility-administered medications on file prior to visit.    Review of  Systems Constitutional: Negative for other unusual diaphoresis, sweats, appetite or weight changes HENT: Negative for other worsening hearing loss, ear pain, facial swelling, mouth sores or neck stiffness.   Eyes: Negative for other worsening pain, redness or other visual disturbance.  Respiratory: Negative for other stridor or swelling Cardiovascular: Negative for other palpitations or other chest pain  Gastrointestinal: Negative for worsening diarrhea or loose stools, blood in stool, distention or other pain Genitourinary: Negative for hematuria, flank pain or other change in urine volume.  Musculoskeletal: Negative for myalgias or other joint swelling.  Skin: Negative for other color change, or other wound or worsening drainage.  Neurological: Negative for other syncope or numbness. Hematological: Negative for other adenopathy or swelling Psychiatric/Behavioral: Negative for hallucinations, other worsening agitation, SI, self-injury, or new decreased concentration All other system neg per pt    Objective:   Physical Exam BP 102/64   Pulse (!) 57   Temp 98.3 F (36.8 C) (Oral)   Ht 5\' 3"  (1.6 m)   Wt 154 lb (69.9 kg)   SpO2 96%   BMI 27.28 kg/m  VS noted,  Constitutional: Pt appears in NAD HENT: Head: NCAT.  Right Ear: External ear normal.  Left Ear: External ear normal.  Eyes: . Pupils are equal, round, and reactive to light. Conjunctivae and EOM are normal Nose: without d/c or deformity Neck: Neck supple. Gross normal ROM Cardiovascular: Normal rate and regular rhythm.   Pulmonary/Chest: Effort normal and breath sounds without rales or wheezing.  Abd:  Soft, NT, ND, + BS, no organomegaly Neurological: Pt is alert. At baseline orientation, motor grossly intact Skin: Skin is warm. No rashes, other new lesions, no LE edema Psychiatric: Pt behavior is normal without agitation  No other exam findings Lab Results  Component Value Date   WBC 5.1 11/01/2017   HGB 12.7  11/01/2017   HCT 37.8 11/01/2017   PLT 291.0 11/01/2017   GLUCOSE 96 11/01/2017   CHOL 216 (H) 11/01/2017   TRIG 72.0 11/01/2017   HDL 101.50 11/01/2017   LDLDIRECT 94.8 04/29/2012   LDLCALC 100 (H) 11/01/2017   ALT 13 11/01/2017   AST 16 11/01/2017   NA 140 11/01/2017   K 4.6 11/01/2017   CL 103 11/01/2017   CREATININE 0.60 11/01/2017   BUN 13 11/01/2017   CO2 33 (H) 11/01/2017   TSH 1.14 07/14/2017   HGBA1C 5.4 08/13/2017        Assessment & Plan:

## 2017-11-09 NOTE — Assessment & Plan Note (Signed)

## 2017-11-28 MED FILL — TOPIRAMATE 50 MG TABLET: 50 | 30 days supply | Qty: 30 | Fill #4

## 2017-11-28 MED FILL — ESCITALOPRAM 10 MG TABLET: 10 | 90 days supply | Qty: 180 | Fill #1

## 2017-11-29 ENCOUNTER — Other Ambulatory Visit: Payer: Self-pay | Admitting: Internal Medicine

## 2017-11-29 DIAGNOSIS — K2100 Gastro-esophageal reflux disease with esophagitis, without bleeding: Secondary | ICD-10-CM

## 2017-11-29 MED FILL — OMEPRAZOLE 20 MG CPDR: 20 | 90 days supply | Qty: 180 | Fill #0

## 2017-11-29 MED FILL — carBAMazepine ER 100 MG TB1: 100 | 30 days supply | Qty: 120 | Fill #4

## 2017-12-21 ENCOUNTER — Other Ambulatory Visit (INDEPENDENT_AMBULATORY_CARE_PROVIDER_SITE_OTHER): Payer: Managed Care, Other (non HMO)

## 2017-12-21 ENCOUNTER — Encounter: Payer: Self-pay | Admitting: Internal Medicine

## 2017-12-21 ENCOUNTER — Ambulatory Visit: Payer: Managed Care, Other (non HMO) | Admitting: Internal Medicine

## 2017-12-21 VITALS — BP 118/76 | HR 61 | Temp 98.1°F | Ht 63.0 in | Wt 157.0 lb

## 2017-12-21 DIAGNOSIS — M659 Synovitis and tenosynovitis, unspecified: Secondary | ICD-10-CM

## 2017-12-21 DIAGNOSIS — M791 Myalgia, unspecified site: Secondary | ICD-10-CM

## 2017-12-21 DIAGNOSIS — B349 Viral infection, unspecified: Secondary | ICD-10-CM | POA: Diagnosis not present

## 2017-12-21 LAB — BASIC METABOLIC PANEL
BUN: 12 mg/dL (ref 6–23)
CO2: 29 mEq/L (ref 19–32)
Calcium: 9 mg/dL (ref 8.4–10.5)
Chloride: 102 mEq/L (ref 96–112)
Creatinine, Ser: 0.55 mg/dL (ref 0.40–1.20)
GFR: 120.64 mL/min (ref >60.00)
Glucose, Bld: 89 mg/dL (ref 70–99)
Potassium: 4.3 mEq/L (ref 3.5–5.1)
Sodium: 138 mEq/L (ref 135–145)

## 2017-12-21 LAB — URINALYSIS, ROUTINE W REFLEX MICROSCOPIC
Bilirubin Urine: NEGATIVE
Hgb urine dipstick: NEGATIVE
Ketones, ur: NEGATIVE
Leukocytes, UA: NEGATIVE
Nitrite: NEGATIVE
RBC / HPF: NONE SEEN (ref 0–?)
Specific Gravity, Urine: 1.01 (ref 1.000–1.030)
Total Protein, Urine: NEGATIVE
Urine Glucose: NEGATIVE
Urobilinogen, UA: 0.2 (ref 0.0–1.0)
pH: 6.5 (ref 5.0–8.0)

## 2017-12-21 LAB — CBC WITH DIFFERENTIAL/PLATELET
Basophils Absolute: 0.1 10*3/uL (ref 0.0–0.1)
Basophils Relative: 1.4 % (ref 0.0–3.0)
Eosinophils Absolute: 0.1 10*3/uL (ref 0.0–0.7)
Eosinophils Relative: 1.6 % (ref 0.0–5.0)
HCT: 39.1 % (ref 36.0–46.0)
Hemoglobin: 13.1 g/dL (ref 12.0–15.0)
Lymphocytes Relative: 43.4 % (ref 12.0–46.0)
Lymphs Abs: 2.2 10*3/uL (ref 0.7–4.0)
MCHC: 33.4 g/dL (ref 30.0–36.0)
MCV: 92.7 fl (ref 78.0–100.0)
Monocytes Absolute: 0.6 10*3/uL (ref 0.1–1.0)
Monocytes Relative: 11.3 % (ref 3.0–12.0)
Neutro Abs: 2.1 10*3/uL (ref 1.4–7.7)
Neutrophils Relative %: 42.3 % — ABNORMAL LOW (ref 43.0–77.0)
Platelets: 310 10*3/uL (ref 150.0–400.0)
RBC: 4.22 Mil/uL (ref 3.87–5.11)
RDW: 12.9 % (ref 11.5–15.5)
WBC: 5 10*3/uL (ref 4.0–10.5)

## 2017-12-21 LAB — CK: Total CK: 70 U/L (ref 7–177)

## 2017-12-21 LAB — SEDIMENTATION RATE: Sed Rate: 3 mm/hr (ref 0–30)

## 2017-12-21 NOTE — Progress Notes (Signed)
Subjective:    Patient ID: Kelli Bell, female    DOB: 1960/08/20, 57 y.o.   MRN: [id number]  HPI   Here to f/u with c/o acute onset persistent mild to mod illness starting Nov 10, after just had flu shot nov 8 prior, has had low grade temp, nausea, achiness and myalgias and lower appetite, hard and worse to keep up with the grandkids.  Worried about ear trouble since she has muffled hearing, but didn't really start until last few days.  Son in Sports coach and daughter and grandkids recently with ? Viral illness but did not last.  Denies urinary symptoms such as dysuria, frequency, urgency, flank pain, hematuria or n/v, fever, chills.   Also has worsening several wks right wrist pain radiates to the hand, better to keep the hand on her chest, but worse to try to use it. Pt denies chest pain, increased sob or doe, wheezing, orthopnea, PND, increased LE swelling, palpitations, dizziness or syncope.  Pt denies new neurological symptoms such as new headache, or facial or extremity weakness or numbness   Pt denies polydipsia, polyuria  No other new complaints  Denies worsening depressive symptoms, suicidal ideation, or panic; has ongoing anxiety Past Medical History:  Diagnosis Date  . ANXIETY 03/30/2007  . BACK PAIN 02/20/2009  . Essential hypertension, benign 05/31/2012  . Family history of anesthesia complication    "mother has hard time waking up"  . GERD 01/13/2007  . H/O hiatal hernia   . HYPERLIPIDEMIA 02/22/2008  . HYPOTHYROIDISM 03/30/2007  . MENOPAUSAL DISORDER 02/23/2008  . Nocturia   . OBSTRUCTIVE SLEEP APNEA 06/29/2007   has not used c pap x 8 months due to wt loss  . SINUSITIS- ACUTE-NOS 03/30/2007  . THORACIC/LUMBOSACRAL NEURITIS/RADICULITIS UNSPEC 02/23/2008  . Wheezing 01/08/2009   Past Surgical History:  Procedure Laterality Date  . ABDOMINAL HYSTERECTOMY  2004   with BSO  . BREATH TEK H PYLORI N/A 05/05/2013   Procedure: BREATH TEK H PYLORI;  Surgeon: Shann Medal, MD;  Location: [facility];  Service: General;  Laterality: N/A;  . CHOLECYSTECTOMY  1980  . GASTRIC ROUX-EN-Y N/A 09/12/2013   Procedure: LAPAROSCOPIC ROUX-EN-Y GASTRIC BYPASS WITH UPPER ENDOSCOPY WITH ENTEROLYSIS OF ADHESIONS;  Surgeon: Shann Medal, MD;  Location: [facility];  Service: General;  Laterality: N/A;  . jaw bone surgury     57 yo  . TONSILLECTOMY      reports that she quit smoking about 20 years ago. She has never used smokeless tobacco. She reports that she drinks alcohol. She reports that she does not use drugs. family history includes Arthritis in her other; Asthma in her brother; Breast cancer in her maternal aunt; Cancer in her mother; Colon polyps (age of onset: 34) in her father; Diabetes in her mother and paternal grandmother; Heart disease in her paternal grandmother; Hypertension in her mother; Lymphoma in her maternal grandmother; Osteoporosis in her other; Stroke in her mother; Thyroid disease in her maternal grandmother and mother. Allergies  Allergen Reactions  . Codeine Nausea Only   Current Outpatient Medications on File Prior to Visit  Medication Sig Dispense Refill  . acetaminophen (TYLENOL) 325 MG tablet Take 650 mg by mouth every 6 (six) hours as needed for mild pain or headache.    Marland Kitchen CALCIUM PO Take 5 mLs by mouth daily.    . carBAMazepine (TEGRETOL PO) Take 100 mg by mouth 2 (two) times daily.    . Cyanocobalamin (B-12 PO) Take 5 mLs by  mouth as needed (low b12).    . escitalopram (LEXAPRO) 10 MG tablet Take 1 tablet twice daily (Patient taking differently: 2 (two) times daily. ) 180 tablet 3  . levothyroxine (SYNTHROID, LEVOTHROID) 88 MCG tablet TAKE 1 TABLET BY MOUTH EVERY MORNING. 90 tablet 3  . Multiple Vitamins-Minerals (MULTIVITAMIN ADULT PO) Take 1 tablet by mouth daily.    Marland Kitchen omeprazole (PRILOSEC) 20 MG capsule TAKE 2 CAPSULES (40 MG TOTAL) BY MOUTH DAILY. 180 capsule 3  . topiramate (TOPAMAX) 50 MG tablet Take 1 tablet by mouth daily.    . traMADol (ULTRAM) 50 MG  tablet Take 1 tablet (50 mg total) by mouth every 6 (six) hours as needed. 60 tablet 3   No current facility-administered medications on file prior to visit.    Review of Systems  Constitutional: Negative for other unusual diaphoresis or sweats HENT: Negative for ear discharge or swelling Eyes: Negative for other worsening visual disturbances Respiratory: Negative for stridor or other swelling  Gastrointestinal: Negative for worsening distension or other blood Genitourinary: Negative for retention or other urinary change Musculoskeletal: Negative for other MSK pain or swelling Skin: Negative for color change or other new lesions Neurological: Negative for worsening tremors and other numbness  Psychiatric/Behavioral: Negative for worsening agitation or other fatigue All other system neg per pt    Objective:   Physical Exam BP 118/76   Pulse 61   Temp 98.1 F (36.7 C) (Oral)   Ht 5\' 3"  (1.6 m)   Wt 157 lb (71.2 kg)   SpO2 97%   BMI 27.81 kg/m  VS noted, mild ill Constitutional: Pt appears in NAD HENT: Head: NCAT.  Right Ear: External ear normal.  Left Ear: External ear normal.  Eyes: . Pupils are equal, round, and reactive to light. Conjunctivae and EOM are normal Bilat tm's with mild erythema.  Max sinus areas non tender.  Pharynx with mild erythema, no exudate Nose: without d/c or deformity Neck: Neck supple. Gross normal ROM Cardiovascular: Normal rate and regular rhythm.   Pulmonary/Chest: Effort normal and breath sounds without rales or wheezing.  Abd:  Soft, NT, ND, + BS, no organomegaly Neurological: Pt is alert. At baseline orientation, motor grossly intact Right arm, wrist and hand without swelling or tenderness except for tender over first dorsal compartment right thumb and wrist Skin: Skin is warm. No rashes, other new lesions, no LE edema Psychiatric: Pt behavior is normal without agitation  No other exam findings Lab Results  Component Value Date   WBC 5.0  12/21/2017   HGB 13.1 12/21/2017   HCT 39.1 12/21/2017   PLT 310.0 12/21/2017   GLUCOSE 89 12/21/2017   CHOL 216 (H) 11/01/2017   TRIG 72.0 11/01/2017   HDL 101.50 11/01/2017   LDLDIRECT 94.8 04/29/2012   LDLCALC 100 (H) 11/01/2017   ALT 13 11/01/2017   AST 16 11/01/2017   NA 138 12/21/2017   K 4.3 12/21/2017   CL 102 12/21/2017   CREATININE 0.55 12/21/2017   BUN 12 12/21/2017   CO2 29 12/21/2017   TSH 1.14 07/14/2017   HGBA1C 5.4 08/13/2017       Assessment & Plan:

## 2017-12-21 NOTE — Patient Instructions (Signed)
You can take Mucinex (or it's generic off brand) for ear congestion, and tylenol as needed for pain.  Please continue all other medications as before, and refills have been done if requested.  Please have the pharmacy call with any other refills you may need.  Please continue your efforts at being more active, low cholesterol diet, and weight control..  Please keep your appointments with your specialists as you may have planned  Please go to the LAB in the Basement (turn left off the elevator) for the tests to be done today  You will be contacted by phone if any changes need to be made immediately.  Otherwise, you will receive a letter about your results with an explanation, but please check with MyChart first.  Please remember to sign up for MyChart if you have not done so, as this will be important to you in the future with finding out test results, communicating by private email, and scheduling acute appointments online when needed.

## 2017-12-24 DIAGNOSIS — M19039 Primary osteoarthritis, unspecified wrist: Secondary | ICD-10-CM | POA: Insufficient documentation

## 2017-12-24 DIAGNOSIS — B349 Viral infection, unspecified: Secondary | ICD-10-CM | POA: Insufficient documentation

## 2017-12-24 NOTE — Assessment & Plan Note (Signed)
Likely given hx, for cbc with labs, hold antibix without specific source, for mucinex otc prn

## 2017-12-24 NOTE — Assessment & Plan Note (Signed)
Most likely related to likely acute viral illness, pt asks for labs as ordered

## 2017-12-24 NOTE — Assessment & Plan Note (Signed)
Right thumb, mild to mod, pt advised to f/u with Sports medicine if not improved

## 2017-12-27 ENCOUNTER — Ambulatory Visit (INDEPENDENT_AMBULATORY_CARE_PROVIDER_SITE_OTHER): Payer: Managed Care, Other (non HMO)

## 2017-12-27 ENCOUNTER — Encounter: Payer: Self-pay | Admitting: Family Medicine

## 2017-12-27 ENCOUNTER — Ambulatory Visit: Payer: Managed Care, Other (non HMO) | Admitting: Family Medicine

## 2017-12-27 VITALS — BP 114/90 | HR 61 | Temp 98.1°F | Ht 63.0 in | Wt 157.0 lb

## 2017-12-27 DIAGNOSIS — M25531 Pain in right wrist: Secondary | ICD-10-CM | POA: Diagnosis not present

## 2017-12-27 DIAGNOSIS — M659 Synovitis and tenosynovitis, unspecified: Secondary | ICD-10-CM | POA: Diagnosis not present

## 2017-12-27 MED ORDER — PREDNISONE 20 MG PO TABS: ORAL_TABLET | ORAL | 0 refills | Status: DC

## 2017-12-27 MED FILL — predniSONE 20 MG TABS: 20 | 10 days supply | Qty: 18 | Fill #0

## 2017-12-27 NOTE — Progress Notes (Signed)
Kelli Bell - 57 y.o. female MRN [id number]  Date of birth: Jan 25, 1961  SUBJECTIVE:  Including CC & ROS.  Chief Complaint  Patient presents with  . Wrist Pain    right wrist     Kelli Bell is a 57 y.o. female that is is presenting with right wrist pain.  Pain is occurring on the radial aspect of her wrist.  The pain is moderate to severe.  She has pain over the [facility] joint and then has some radiation proximally up her forearm.  The pain is severe and has not been improved with medications to date.  She denies any inciting event.  She has been sick recently.  She denies any history of similar pain.  The pain is getting progressively worse.  She denies any history of gout.  She does endorse redness and swelling..    Review of Systems  Constitutional: Negative for fever.  HENT: Negative for congestion.   Respiratory: Negative for cough.   Cardiovascular: Negative for chest pain.  Gastrointestinal: Negative for abdominal pain.  Musculoskeletal: Positive for joint swelling.  Neurological: Negative for weakness.  Hematological: Negative for adenopathy.  Psychiatric/Behavioral: Negative for agitation.    HISTORY: Past Medical, Surgical, Social, and Family History Reviewed & Updated per EMR.   Pertinent Historical Findings include:  Past Medical History:  Diagnosis Date  . ANXIETY 03/30/2007  . BACK PAIN 02/20/2009  . Essential hypertension, benign 05/31/2012  . Family history of anesthesia complication    "mother has hard time waking up"  . GERD 01/13/2007  . H/O hiatal hernia   . HYPERLIPIDEMIA 02/22/2008  . HYPOTHYROIDISM 03/30/2007  . MENOPAUSAL DISORDER 02/23/2008  . Nocturia   . OBSTRUCTIVE SLEEP APNEA 06/29/2007   has not used c pap x 8 months due to wt loss  . SINUSITIS- ACUTE-NOS 03/30/2007  . THORACIC/LUMBOSACRAL NEURITIS/RADICULITIS UNSPEC 02/23/2008  . Wheezing 01/08/2009    Past Surgical History:  Procedure Laterality Date  . ABDOMINAL HYSTERECTOMY  2004   with BSO  . BREATH  TEK H PYLORI N/A 05/05/2013   Procedure: BREATH TEK H PYLORI;  Surgeon: Shann Medal, MD;  Location: [facility];  Service: General;  Laterality: N/A;  . CHOLECYSTECTOMY  1980  . GASTRIC ROUX-EN-Y N/A 09/12/2013   Procedure: LAPAROSCOPIC ROUX-EN-Y GASTRIC BYPASS WITH UPPER ENDOSCOPY WITH ENTEROLYSIS OF ADHESIONS;  Surgeon: Shann Medal, MD;  Location: [facility];  Service: General;  Laterality: N/A;  . jaw bone surgury     57 yo  . TONSILLECTOMY      Allergies  Allergen Reactions  . Codeine Nausea Only    Family History  Problem Relation Age of Onset  . Stroke Mother   . Hypertension Mother   . Diabetes Mother   . Cancer Mother        basel cell cancer  . Thyroid disease Mother   . Arthritis Other   . Colon polyps Father 70       "CANCEROUS POLYPS REMOVED THIS YEAR"  . Asthma Brother   . Thyroid disease Maternal Grandmother   . Lymphoma Maternal Grandmother   . Osteoporosis Other   . Breast cancer Maternal Aunt        mat great aunt  . Diabetes Paternal Grandmother   . Heart disease Paternal Grandmother   . Colon cancer Neg Hx      Social History   Socioeconomic History  . Marital status: Widowed    Spouse name: Not on file  . Number of children: Not  on file  . Years of education: Not on file  . Highest education level: Not on file  Occupational History  . Not on file  Social Needs  . Financial resource strain: Not on file  . Food insecurity:    Worry: Not on file    Inability: Not on file  . Transportation needs:    Medical: Not on file    Non-medical: Not on file  Tobacco Use  . Smoking status: Former Smoker    Last attempt to quit: 07/06/1997    Years since quitting: 20.4  . Smokeless tobacco: Never Used  Substance and Sexual Activity  . Alcohol use: Yes    Comment: [place]  . Drug use: No  . Sexual activity: Not Currently    Birth control/protection: Surgical    Comment: intercourse age 40, less than 5 sexual partners,des neg    Lifestyle  . Physical activity:    Days per week: Not on file    Minutes per session: Not on file  . Stress: Not on file  Relationships  . Social connections:    Talks on phone: Not on file    Gets together: Not on file    Attends religious service: Not on file    Active member of club or organization: Not on file    Attends meetings of clubs or organizations: Not on file    Relationship status: Not on file  . Intimate partner violence:    Fear of current or ex partner: Not on file    Emotionally abused: Not on file    Physically abused: Not on file    Forced sexual activity: Not on file  Other Topics Concern  . Not on file  Social History Narrative  . Not on file     PHYSICAL EXAM:  VS: BP 114/90 (BP Location: Left Arm, Patient Position: Sitting, Cuff Size: Normal)   Pulse 61   Temp 98.1 F (36.7 C) (Oral)   Ht 5\' 3"  (1.6 m)   Wt 157 lb (71.2 kg)   SpO2 97%   BMI 27.81 kg/m  Physical Exam Gen: NAD, alert, cooperative with exam, well-appearing ENT: normal lips, normal nasal mucosa,  Eye: normal EOM, normal conjunctiva and lids CV:  no edema, +2 pedal pulses   Resp: no accessory muscle use, non-labored,  Skin: no rashes, no areas of induration  Neuro: normal tone, normal sensation to touch Psych:  normal insight, alert and oriented MSK:  Right wrist: Redness and swelling over the radial sided wrist. Tenderness to palpation over the [facility] and the distal radius and carpal bones. Limited movement of the thumb secondary to pain. Normal pincer grasp. Normal grip strength. Limited range of motion of the wrist secondary to pain. Neurovascularly intact  Limited ultrasound: Right wrist:  CMC joint with mild degenerative changes. First dorsal compartment with normal-appearing tendon with no significant effusion.  There does appear to be small effusion around the tendon as it crosses over the carpal bones. There appears to be a hyperlucency deep in the carpal bones.   This could represent a gouty change.  Summary: Findings suggestive of gout  Ultrasound and interpretation by Clearance Coots, MD      ASSESSMENT & PLAN:   Right wrist pain Having significant tenderness on exam.  No history of gout.  Ultrasound was unrevealing for significant CMC arthritis or tenosynovitis of the first dorsal compartment.  Possibly a gouty change of the carpal bones. -Prednisone. -Vimovo samples. -Counseled on supportive  care - xray -If no improvement may need to consider MRI, lab work, or antibiotics.

## 2017-12-27 NOTE — Patient Instructions (Signed)
Nice to meet you  Please try the prednisone. Please take this entire course.  Please take the vimovo if you continue to have pain.  Please contact me or follow up next week if your pain hasn't improve.  I will call you with the results from today.

## 2017-12-27 NOTE — Assessment & Plan Note (Addendum)
Having significant tenderness on exam.  No history of gout.  Ultrasound was unrevealing for significant CMC arthritis or tenosynovitis of the first dorsal compartment.  Possibly a gouty change of the carpal bones. -Prednisone. -Vimovo samples. -Counseled on supportive care - xray -If no improvement may need to consider MRI, lab work, or antibiotics.

## 2017-12-28 ENCOUNTER — Telehealth: Payer: Self-pay | Admitting: Family Medicine

## 2017-12-28 NOTE — Telephone Encounter (Signed)
Left VM for patient. If she calls back please have her speak with a nurse/CMA and inform that her xray shows changes in her wrist that I also observed with ultrasound. The [facility] can report results to patient.   If any questions then please take the best time and phone number to call and I will try to call her back.   Rosemarie Ax, MD [facility] [facility] 12/28/2017, 4:58 PM

## 2017-12-28 NOTE — Telephone Encounter (Signed)
Patient is returning call from doctor regarding her ultrasound results.  Tried NT but they were busy with calls.  Please call patient back at [phone]

## 2017-12-29 MED ORDER — GABAPENTIN 100 MG PO CAPS: 100.0000 mg | ORAL_CAPSULE | Freq: Three times a day (TID) | ORAL | 1 refills | Status: DC

## 2017-12-29 MED FILL — GABAPENTIN 100 MG CAPSULE: 100 | 30 days supply | Qty: 90 | Fill #0

## 2017-12-29 NOTE — Addendum Note (Signed)
Addended by: Clearance Coots E on: 12/29/2017 11:09 AM   Modules accepted: Orders

## 2017-12-29 NOTE — Telephone Encounter (Signed)
Pt is requesting that Dr. Raeford Razor give results to her daughter Kelli Bell CB# [phone] Please advise.

## 2017-12-29 NOTE — Telephone Encounter (Signed)
Spoke with patient's daughter about ongoing pain. Will try gabapentin.   Rosemarie Ax, MD [facility] 12/29/2017, 11:08 AM

## 2017-12-31 MED FILL — TOPIRAMATE 50 MG TABLET: 50 | 30 days supply | Qty: 30 | Fill #5

## 2017-12-31 MED FILL — carBAMazepine ER 100 MG TB1: 100 | 30 days supply | Qty: 120 | Fill #5

## 2018-01-03 ENCOUNTER — Telehealth: Payer: Self-pay

## 2018-01-03 NOTE — Telephone Encounter (Signed)
Copied from [place] [id number]. Topic: Quick Communication - See Telephone Encounter >> Jan 03, 2018 10:03 AM Parke Poisson wrote: CRM for notification. See Telephone encounter for: 01/03/18. Pt states the the swelling has gone down in her right wrist but she is still having a lot of pain.She didn't's know if you wanted to order some test.

## 2018-01-03 NOTE — Telephone Encounter (Signed)
Spoke with patient about her wrist pain. Will increase her gabapentin. Seems to be nerve pain related.   Rosemarie Ax, MD [facility] 01/03/2018, 3:11 PM

## 2018-01-12 ENCOUNTER — Telehealth: Payer: Self-pay

## 2018-01-12 DIAGNOSIS — M25531 Pain in right wrist: Secondary | ICD-10-CM

## 2018-01-12 NOTE — Telephone Encounter (Signed)
Dr. Raeford Razor please advise  Copied from [place] [id number]. Topic: General - Inquiry >> Jan 12, 2018 11:27 AM Conception Chancy, NT wrote: Reason for CRM: patient is calling and would like to update Dr. Raeford Razor that she is still in pain with the gout in her wrist. She is thinking it should be better by now. She would like Dr. Raeford Razor advice.

## 2018-01-14 MED ORDER — PREDNISONE 5 MG PO TABS: ORAL_TABLET | ORAL | 0 refills | Status: DC

## 2018-01-14 MED FILL — predniSONE 5 MG (21) TBPK: 5 | 6 days supply | Qty: 21 | Fill #0

## 2018-01-14 NOTE — Telephone Encounter (Signed)
Spoke with patient. Will try another course of steroids. Will obtain MRI to evaluate for occult fracture vs tenosynovitis vs gout.   Rosemarie Ax, MD [facility] 01/14/2018, 8:18 AM

## 2018-01-20 ENCOUNTER — Telehealth: Payer: Self-pay | Admitting: Emergency Medicine

## 2018-01-20 NOTE — Telephone Encounter (Signed)
Spoke with patient about MRI. Will complete the MRI of the wrist. Depending on the results, may also need to have nerve study if also having radicular type pain.   Rosemarie Ax, MD [facility] 01/20/2018, 4:53 PM

## 2018-01-20 NOTE — Telephone Encounter (Signed)
Copied from [place] [id number]. Topic: General - Other >> Jan 20, 2018 12:00 PM Virl Axe D wrote: Reason for CRM: Pt stated that she was contacted to schedule an MRI per Dr. Raeford Razor however she is also having pain in her arm and would like to have MRI on wrist and arm so she does not have to have two separate appointments. She would like to speak with Dr. Raeford Razor about this as soon as possible as she has already been contacted about appointment but does not want to schedule it until she has spoken with him. Please advise CB# [phone]

## 2018-01-26 MED FILL — LEVOTHYROXINE 88 MCG TABLET: 88 | 90 days supply | Qty: 90 | Fill #1

## 2018-01-27 MED FILL — TOPIRAMATE 50 MG TABLET: 50 | 30 days supply | Qty: 30 | Fill #6

## 2018-01-27 MED FILL — carBAMazepine ER 100 MG TB1: 100 | 30 days supply | Qty: 120 | Fill #6

## 2018-01-30 ENCOUNTER — Ambulatory Visit
Admission: RE | Admit: 2018-01-30 | Discharge: 2018-01-30 | Disposition: A | Discharge status: Home / Self Care | Payer: Managed Care, Other (non HMO) | Source: Ambulatory Visit | Attending: Family Medicine | Admitting: Family Medicine

## 2018-01-30 DIAGNOSIS — M25531 Pain in right wrist: Secondary | ICD-10-CM

## 2018-02-01 ENCOUNTER — Telehealth: Payer: Self-pay | Admitting: Family Medicine

## 2018-02-01 NOTE — Telephone Encounter (Signed)
Spoke with patient about MRI. Can consider a wrist injection.   Rosemarie Ax, MD [facility] 02/01/2018, 10:11 AM

## 2018-02-02 ENCOUNTER — Ambulatory Visit (INDEPENDENT_AMBULATORY_CARE_PROVIDER_SITE_OTHER): Payer: Managed Care, Other (non HMO)

## 2018-02-02 ENCOUNTER — Ambulatory Visit: Payer: Managed Care, Other (non HMO) | Admitting: Family Medicine

## 2018-02-02 VITALS — BP 114/86 | Ht 63.0 in | Wt 157.0 lb

## 2018-02-02 DIAGNOSIS — M19039 Primary osteoarthritis, unspecified wrist: Secondary | ICD-10-CM | POA: Diagnosis not present

## 2018-02-02 MED ORDER — DICLOFENAC SODIUM 2 % TD SOLN: 1.0000 "application " | Freq: Two times a day (BID) | TRANSDERMAL | 3 refills | Status: DC

## 2018-02-02 NOTE — Progress Notes (Signed)
Kelli Bell - 58 y.o. female MRN [id number]  Date of birth: May 18, 1960  SUBJECTIVE:  Including CC & ROS.  No chief complaint on file.   Kelli Bell is a 58 y.o. female that is following up for right wrist pain.  It has been present for roughly 6 to 8 weeks.  She has pain that encircles the dorsal aspect of the wrist and also has pain that radiates up proximally.  Has been prescribed gabapentin that does seem to help.  Has been using an over-the-counter wrist brace with some improvement.  Pain is constant in severity.  Pain is worse after using it.  It is throbbing and sharp in nature.  Denies any inciting event or trauma..  Independent review of the right wrist MRI from 1/5 shows a tear of the scapholunate ligament.  Degenerative changes of the TFCC.  Edema of the distal pole of the scaphoid.   Review of Systems  Constitutional: Negative for fever.  HENT: Negative for congestion.   Respiratory: Negative for cough.   Cardiovascular: Negative for chest pain.  Gastrointestinal: Negative for abdominal pain.  Musculoskeletal: Positive for arthralgias.  Skin: Negative for color change.  Neurological: Negative for weakness.  Hematological: Negative for adenopathy.  Psychiatric/Behavioral: Negative for agitation.    HISTORY: Past Medical, Surgical, Social, and Family History Reviewed & Updated per EMR.   Pertinent Historical Findings include:  Past Medical History:  Diagnosis Date  . ANXIETY 03/30/2007  . BACK PAIN 02/20/2009  . Essential hypertension, benign 05/31/2012  . Family history of anesthesia complication    "mother has hard time waking up"  . GERD 01/13/2007  . H/O hiatal hernia   . HYPERLIPIDEMIA 02/22/2008  . HYPOTHYROIDISM 03/30/2007  . MENOPAUSAL DISORDER 02/23/2008  . Nocturia   . OBSTRUCTIVE SLEEP APNEA 06/29/2007   has not used c pap x 8 months due to wt loss  . SINUSITIS- ACUTE-NOS 03/30/2007  . THORACIC/LUMBOSACRAL NEURITIS/RADICULITIS UNSPEC 02/23/2008  . Wheezing 01/08/2009     Past Surgical History:  Procedure Laterality Date  . ABDOMINAL HYSTERECTOMY  2004   with BSO  . BREATH TEK H PYLORI N/A 05/05/2013   Procedure: BREATH TEK H PYLORI;  Surgeon: Shann Medal, MD;  Location: [facility];  Service: General;  Laterality: N/A;  . CHOLECYSTECTOMY  1980  . GASTRIC ROUX-EN-Y N/A 09/12/2013   Procedure: LAPAROSCOPIC ROUX-EN-Y GASTRIC BYPASS WITH UPPER ENDOSCOPY WITH ENTEROLYSIS OF ADHESIONS;  Surgeon: Shann Medal, MD;  Location: [facility];  Service: General;  Laterality: N/A;  . jaw bone surgury     58 yo  . TONSILLECTOMY      Allergies  Allergen Reactions  . Codeine Nausea Only    Family History  Problem Relation Age of Onset  . Stroke Mother   . Hypertension Mother   . Diabetes Mother   . Cancer Mother        basel cell cancer  . Thyroid disease Mother   . Arthritis Other   . Colon polyps Father 63       "CANCEROUS POLYPS REMOVED THIS YEAR"  . Asthma Brother   . Thyroid disease Maternal Grandmother   . Lymphoma Maternal Grandmother   . Osteoporosis Other   . Breast cancer Maternal Aunt        mat great aunt  . Diabetes Paternal Grandmother   . Heart disease Paternal Grandmother   . Colon cancer Neg Hx      Social History   Socioeconomic History  . Marital status: Widowed  Spouse name: Not on file  . Number of children: Not on file  . Years of education: Not on file  . Highest education level: Not on file  Occupational History  . Not on file  Social Needs  . Financial resource strain: Not on file  . Food insecurity:    Worry: Not on file    Inability: Not on file  . Transportation needs:    Medical: Not on file    Non-medical: Not on file  Tobacco Use  . Smoking status: Former Smoker    Last attempt to quit: 07/06/1997    Years since quitting: 20.5  . Smokeless tobacco: Never Used  Substance and Sexual Activity  . Alcohol use: Yes    Comment: [place]  . Drug use: No  . Sexual activity: Not  Currently    Birth control/protection: Surgical    Comment: intercourse age 33, less than 5 sexual partners,des neg  Lifestyle  . Physical activity:    Days per week: Not on file    Minutes per session: Not on file  . Stress: Not on file  Relationships  . Social connections:    Talks on phone: Not on file    Gets together: Not on file    Attends religious service: Not on file    Active member of club or organization: Not on file    Attends meetings of clubs or organizations: Not on file    Relationship status: Not on file  . Intimate partner violence:    Fear of current or ex partner: Not on file    Emotionally abused: Not on file    Physically abused: Not on file    Forced sexual activity: Not on file  Other Topics Concern  . Not on file  Social History Narrative  . Not on file     PHYSICAL EXAM:  VS: There were no vitals taken for this visit. Physical Exam Gen: NAD, alert, cooperative with exam, well-appearing ENT: normal lips, normal nasal mucosa,  Eye: normal EOM, normal conjunctiva and lids CV:  no edema, +2 pedal pulses   Resp: no accessory muscle use, non-labored,  Skin: no rashes, no areas of induration  Neuro: normal tone, normal sensation to touch Psych:  normal insight, alert and oriented MSK:  Right wrist:  No swelling or ecchymosis  Normal ROM  TTP over the TFCC and scaphoid  No clicking with flexion or extension  Normal grip strength  Neurovascularly intact    Aspiration/Injection Procedure Note Kelli Bell 07/26/1960  Procedure: Injection Indications: right wrist pain   Procedure Details Consent: Risks of procedure as well as the alternatives and risks of each were explained to the (patient/caregiver).  Consent for procedure obtained. Time Out: Verified patient identification, verified procedure, site/side was marked, verified correct patient position, special equipment/implants available, medications/allergies/relevent history reviewed,  required imaging and test results available.  Performed.  The area was cleaned with iodine and alcohol swabs.    The right radiocarpal joint  was injected using 1 cc's of 40 mg kenalog and 1 cc's of 0.25% bupivacaine with a 25 1 1/2" needle.  Ultrasound was used. Images were obtained in trans views showing the injection.     A sterile dressing was applied.  Patient did tolerate procedure well.        ASSESSMENT & PLAN:   Wrist arthritis MRI showing different pathologies. Unclear which is the source or if she does have more radicular symptoms.  - injection today  -  pennsaid  - thumb spica splint  - f/u in 3 weeks  - if no improvement consider other injection, PT or EMG

## 2018-02-02 NOTE — Assessment & Plan Note (Signed)
MRI showing different pathologies. Unclear which is the source or if she does have more radicular symptoms.  - injection today  - pennsaid  - thumb spica splint  - f/u in 3 weeks  - if no improvement consider other injection, PT or EMG

## 2018-02-02 NOTE — Patient Instructions (Addendum)
Good to see you  Please try ice on the wrist. 20 minutes at a time and 3-4 times daily  Please use thumb spica splint when you are active. You can take it off at night. Please use it for the next 2-3 weeks.  Please try the pennsaid  Please see me back in 3 weeks.

## 2018-02-03 ENCOUNTER — Encounter: Payer: Self-pay | Admitting: Family Medicine

## 2018-02-07 ENCOUNTER — Telehealth: Payer: Self-pay | Admitting: Internal Medicine

## 2018-02-07 MED ORDER — DICLOFENAC SODIUM 2 % TD SOLN: 1.0000 "application " | Freq: Two times a day (BID) | TRANSDERMAL | 3 refills | Status: DC

## 2018-02-07 NOTE — Telephone Encounter (Signed)
Informed patient of sending to different pharmacy to be covered.   Rosemarie Ax, MD [facility] 02/07/2018, 4:35 PM

## 2018-02-07 NOTE — Telephone Encounter (Addendum)
Copied from [place] [id number]. Topic: General - Other >> Feb 07, 2018 12:32 PM Lennox Solders wrote: Reason for CRM: pt is calling and [place] outpt pharm told her the pennsaid 2% is not covered on her insurance however diclofenac 1% gel is covered. Please call new rx into [facility] pharm. Pt saw dr Rosiland Oz on 02-02-2018

## 2018-02-08 MED FILL — ESCITALOPRAM 10 MG TABLET: 10 | 90 days supply | Qty: 180 | Fill #2

## 2018-02-23 ENCOUNTER — Ambulatory Visit: Payer: Managed Care, Other (non HMO) | Admitting: Family Medicine

## 2018-02-23 ENCOUNTER — Encounter: Payer: Self-pay | Admitting: Family Medicine

## 2018-02-23 VITALS — BP 130/80 | HR 68 | Temp 99.0°F | Ht 63.0 in | Wt 165.2 lb

## 2018-02-23 DIAGNOSIS — M19039 Primary osteoarthritis, unspecified wrist: Secondary | ICD-10-CM | POA: Diagnosis not present

## 2018-02-23 MED ORDER — KETOROLAC TROMETHAMINE 60 MG/2ML IM SOLN
60.0000 mg | Freq: Once | INTRAMUSCULAR | Status: AC
  Administered 2018-02-23: 60 mg via INTRAMUSCULAR

## 2018-02-23 NOTE — Patient Instructions (Addendum)
Good to see you  You will get a call about the referral  Hopefully we can get your pain under control

## 2018-02-23 NOTE — Assessment & Plan Note (Signed)
Has had limited improvement in the therapies to date. We have tried injections which resolved her pain for a short period of time. So would suggest that her pain originates in the writs as opposed to being radicular.  - IM Toradol  - referral to ortho, hand surgery, Dr. Grandville Silos

## 2018-02-23 NOTE — Progress Notes (Signed)
Kelli Bell - 58 y.o. female MRN [id number]  Date of birth: 1960/12/27  SUBJECTIVE:  Including CC & ROS.  Chief Complaint  Patient presents with  . Hand Pain    f/u on right wrist pain . pt sts no improvement since last visit . she has been using pennsaid 2% gel and that does not help. gabapentin does help but she is unable to drive after taking that medication.     Kelli Bell is a 58 y.o. female that is following up for her right wrist pain.  She received an injection on 1/8 and had resolution of her pain for a few hours.  The pain is still occurring over the ulnar aspect, over the dorsal carpal bones and over the radial aspect.  Pain seems to be worse when she moves or touches her wrist.  Has limited improvement with other medications.  Has been wearing the thumb spica splint that helps with keeping the wrist still.  The pain seems worse whenever it is touched or when she moves it.  She is limited with what she wants to do.  Review of the MRI right wrist from 1/5 shows tearing of the scapholunate ligament, degenerative changes of the TFCC and edema of the distal pole the scaphoid.   Review of Systems  Constitutional: Negative for fever.  HENT: Negative for congestion.   Respiratory: Negative for cough.   Cardiovascular: Negative for chest pain.  Gastrointestinal: Negative for abdominal pain.  Musculoskeletal: Positive for arthralgias.  Skin: Negative for color change.  Neurological: Negative for weakness.  Hematological: Negative for adenopathy.  Psychiatric/Behavioral: Negative for agitation.    HISTORY: Past Medical, Surgical, Social, and Family History Reviewed & Updated per EMR.   Pertinent Historical Findings include:  Past Medical History:  Diagnosis Date  . ANXIETY 03/30/2007  . BACK PAIN 02/20/2009  . Essential hypertension, benign 05/31/2012  . Family history of anesthesia complication    "mother has hard time waking up"  . GERD 01/13/2007  . H/O hiatal hernia   .  HYPERLIPIDEMIA 02/22/2008  . HYPOTHYROIDISM 03/30/2007  . MENOPAUSAL DISORDER 02/23/2008  . Nocturia   . OBSTRUCTIVE SLEEP APNEA 06/29/2007   has not used c pap x 8 months due to wt loss  . SINUSITIS- ACUTE-NOS 03/30/2007  . THORACIC/LUMBOSACRAL NEURITIS/RADICULITIS UNSPEC 02/23/2008  . Wheezing 01/08/2009    Past Surgical History:  Procedure Laterality Date  . ABDOMINAL HYSTERECTOMY  2004   with BSO  . BREATH TEK H PYLORI N/A 05/05/2013   Procedure: BREATH TEK H PYLORI;  Surgeon: Shann Medal, MD;  Location: [facility];  Service: General;  Laterality: N/A;  . CHOLECYSTECTOMY  1980  . GASTRIC ROUX-EN-Y N/A 09/12/2013   Procedure: LAPAROSCOPIC ROUX-EN-Y GASTRIC BYPASS WITH UPPER ENDOSCOPY WITH ENTEROLYSIS OF ADHESIONS;  Surgeon: Shann Medal, MD;  Location: [facility];  Service: General;  Laterality: N/A;  . jaw bone surgury     58 yo  . TONSILLECTOMY      Allergies  Allergen Reactions  . Codeine Nausea Only    Family History  Problem Relation Age of Onset  . Stroke Mother   . Hypertension Mother   . Diabetes Mother   . Cancer Mother        basel cell cancer  . Thyroid disease Mother   . Arthritis Other   . Colon polyps Father 7       "CANCEROUS POLYPS REMOVED THIS YEAR"  . Asthma Brother   . Thyroid disease Maternal Grandmother   .  Lymphoma Maternal Grandmother   . Osteoporosis Other   . Breast cancer Maternal Aunt        mat great aunt  . Diabetes Paternal Grandmother   . Heart disease Paternal Grandmother   . Colon cancer Neg Hx      Social History   Socioeconomic History  . Marital status: Widowed    Spouse name: Not on file  . Number of children: Not on file  . Years of education: Not on file  . Highest education level: Not on file  Occupational History  . Not on file  Social Needs  . Financial resource strain: Not on file  . Food insecurity:    Worry: Not on file    Inability: Not on file  . Transportation needs:    Medical: Not on file     Non-medical: Not on file  Tobacco Use  . Smoking status: Former Smoker    Last attempt to quit: 07/06/1997    Years since quitting: 20.6  . Smokeless tobacco: Never Used  Substance and Sexual Activity  . Alcohol use: Yes    Comment: [place]  . Drug use: No  . Sexual activity: Not Currently    Birth control/protection: Surgical    Comment: intercourse age 58, less than 5 sexual partners,des neg  Lifestyle  . Physical activity:    Days per week: Not on file    Minutes per session: Not on file  . Stress: Not on file  Relationships  . Social connections:    Talks on phone: Not on file    Gets together: Not on file    Attends religious service: Not on file    Active member of club or organization: Not on file    Attends meetings of clubs or organizations: Not on file    Relationship status: Not on file  . Intimate partner violence:    Fear of current or ex partner: Not on file    Emotionally abused: Not on file    Physically abused: Not on file    Forced sexual activity: Not on file  Other Topics Concern  . Not on file  Social History Narrative  . Not on file     PHYSICAL EXAM:  VS: BP 130/80   Pulse 68   Temp 99 F (37.2 C) (Oral)   Ht 5\' 3"  (1.6 m)   Wt 165 lb 3.2 oz (74.9 kg)   SpO2 96%   BMI 29.26 kg/m  Physical Exam Gen: NAD, alert, cooperative with exam, well-appearing ENT: normal lips, normal nasal mucosa,  Eye: normal EOM, normal conjunctiva and lids CV:  no edema, +2 pedal pulses   Resp: no accessory muscle use, non-labored,  Skin: no rashes, no areas of induration  Neuro: normal tone, normal sensation to touch Psych:  normal insight, alert and oriented MSK:  Right wrist:  No swelling or ecchymosis  Normal passive ROm  Pain with ulnar deviation  No clunk Pain with flexion and extension  Neurovascularly intact      ASSESSMENT & PLAN:   Wrist arthritis Has had limited improvement in the therapies to date. We have tried  injections which resolved her pain for a short period of time. So would suggest that her pain originates in the writs as opposed to being radicular.  - IM Toradol  - referral to ortho, hand surgery, Dr. Grandville Silos

## 2018-02-27 MED FILL — TOPIRAMATE 50 MG TABLET: 50 | 30 days supply | Qty: 30 | Fill #7

## 2018-02-28 MED FILL — carBAMazepine ER 100 MG TB1: 100 | 30 days supply | Qty: 120 | Fill #7

## 2018-03-10 DIAGNOSIS — M25531 Pain in right wrist: Secondary | ICD-10-CM | POA: Insufficient documentation

## 2018-03-10 MED FILL — MELOXICAM 15 MG TABLET: 15 | 30 days supply | Qty: 30 | Fill #0

## 2018-03-31 MED FILL — TOPIRAMATE 50 MG TABLET: 50 | 30 days supply | Qty: 30 | Fill #8

## 2018-03-31 MED FILL — OMEPRAZOLE 20 MG CPDR: 20 | 90 days supply | Qty: 180 | Fill #1

## 2018-04-06 MED FILL — carBAMazepine ER 100 MG TB1: 100 | 30 days supply | Qty: 120 | Fill #8

## 2018-04-06 MED FILL — MELOXICAM 15 MG TABLET: 15 | 30 days supply | Qty: 30 | Fill #1

## 2018-05-13 MED FILL — TOPIRAMATE 50 MG TABLET: 50 | 30 days supply | Qty: 30 | Fill #9

## 2018-05-13 MED FILL — MELOXICAM 15 MG TABLET: 15 | 30 days supply | Qty: 30 | Fill #2

## 2018-05-13 MED FILL — ESCITALOPRAM 10 MG TABLET: 10 | 90 days supply | Qty: 180 | Fill #3

## 2018-05-13 MED FILL — carBAMazepine ER 100 MG TB1: 100 | 30 days supply | Qty: 120 | Fill #9

## 2018-05-13 MED FILL — LEVOTHYROXINE 88 MCG TABLET: 88 | 90 days supply | Qty: 90 | Fill #2

## 2018-06-15 MED FILL — carBAMazepine ER 100 MG TB1: 100 | 30 days supply | Qty: 120 | Fill #10

## 2018-06-15 MED FILL — OMEPRAZOLE 20 MG CPDR: 20 | 90 days supply | Qty: 180 | Fill #2

## 2018-06-15 MED FILL — TOPIRAMATE 50 MG TABLET: 50 | 30 days supply | Qty: 30 | Fill #10

## 2018-07-06 MED FILL — TOPIRAMATE 50 MG TABLET: 50 | 30 days supply | Qty: 30 | Fill #0

## 2018-07-06 MED FILL — traMADol HCL 50 MG TABS: 50 | 22 days supply | Qty: 90 | Fill #0

## 2018-07-06 MED FILL — carBAMazepine ER 100 MG TB1: 100 | 30 days supply | Qty: 120 | Fill #0

## 2018-07-18 ENCOUNTER — Other Ambulatory Visit: Payer: Self-pay

## 2018-07-19 ENCOUNTER — Encounter: Payer: Self-pay | Admitting: Women's Health

## 2018-07-19 ENCOUNTER — Other Ambulatory Visit: Payer: Self-pay | Admitting: Women's Health

## 2018-07-19 ENCOUNTER — Ambulatory Visit (INDEPENDENT_AMBULATORY_CARE_PROVIDER_SITE_OTHER): Payer: Managed Care, Other (non HMO) | Admitting: Women's Health

## 2018-07-19 VITALS — BP 112/68 | Ht 63.5 in | Wt 167.8 lb

## 2018-07-19 DIAGNOSIS — Z01419 Encounter for gynecological examination (general) (routine) without abnormal findings: Secondary | ICD-10-CM

## 2018-07-19 DIAGNOSIS — Z1231 Encounter for screening mammogram for malignant neoplasm of breast: Secondary | ICD-10-CM

## 2018-07-19 MED ORDER — ESCITALOPRAM OXALATE 20 MG PO TABS: ORAL_TABLET | ORAL | 4 refills | Status: DC

## 2018-07-19 MED ORDER — ESCITALOPRAM OXALATE 20 MG PO TABS: 20.0000 mg | ORAL_TABLET | Freq: Every day | ORAL | 4 refills | Status: DC

## 2018-07-19 MED FILL — ESCITALOPRAM 20 MG TABLET: 20 | 90 days supply | Qty: 90 | Fill #0

## 2018-07-19 NOTE — Patient Instructions (Signed)
Health Maintenance for Postmenopausal Women Menopause is a normal process in which your reproductive ability comes to an end. This process happens gradually over a span of months to years, usually between the ages of 62 and 89. Menopause is complete when you have missed 12 consecutive menstrual periods. It is important to talk with your health care provider about some of the most common conditions that affect postmenopausal women, such as heart disease, cancer, and bone loss (osteoporosis). Adopting a healthy lifestyle and getting preventive care can help to promote your health and wellness. Those actions can also lower your chances of developing some of these common conditions. What should I know about menopause? During menopause, you may experience a number of symptoms, such as:  Moderate-to-severe hot flashes.  Night sweats.  Decrease in sex drive.  Mood swings.  Headaches.  Tiredness.  Irritability.  Memory problems.  Insomnia. Choosing to treat or not to treat menopausal changes is an individual decision that you make with your health care provider. What should I know about hormone replacement therapy and supplements? Hormone therapy products are effective for treating symptoms that are associated with menopause, such as hot flashes and night sweats. Hormone replacement carries certain risks, especially as you become older. If you are thinking about using estrogen or estrogen with progestin treatments, discuss the benefits and risks with your health care provider. What should I know about heart disease and stroke? Heart disease, heart attack, and stroke become more likely as you age. This may be due, in part, to the hormonal changes that your body experiences during menopause. These can affect how your body processes dietary fats, triglycerides, and cholesterol. Heart attack and stroke are both medical emergencies. There are many things that you can do to help prevent heart disease  and stroke:  Have your blood pressure checked at least every 1-2 years. High blood pressure causes heart disease and increases the risk of stroke.  If you are 79-72 years old, ask your health care provider if you should take aspirin to prevent a heart attack or a stroke.  Do not use any tobacco products, including cigarettes, chewing tobacco, or electronic cigarettes. If you need help quitting, ask your health care provider.  It is important to eat a healthy diet and maintain a healthy weight. ? Be sure to include plenty of vegetables, fruits, low-fat dairy products, and lean protein. ? Avoid eating foods that are high in solid fats, added sugars, or salt (sodium).  Get regular exercise. This is one of the most important things that you can do for your health. ? Try to exercise for at least 150 minutes each week. The type of exercise that you do should increase your heart rate and make you sweat. This is known as moderate-intensity exercise. ? Try to do strengthening exercises at least twice each week. Do these in addition to the moderate-intensity exercise.  Know your numbers.Ask your health care provider to check your cholesterol and your blood glucose. Continue to have your blood tested as directed by your health care provider.  What should I know about cancer screening? There are several types of cancer. Take the following steps to reduce your risk and to catch any cancer development as early as possible. Breast Cancer  Practice breast self-awareness. ? This means understanding how your breasts normally appear and feel. ? It also means doing regular breast self-exams. Let your health care provider know about any changes, no matter how small.  If you are 40 or  older, have a clinician do a breast exam (clinical breast exam or CBE) every year. Depending on your age, family history, and medical history, it may be recommended that you also have a yearly breast X-ray (mammogram).  If you  have a family history of breast cancer, talk with your health care provider about genetic screening.  If you are at high risk for breast cancer, talk with your health care provider about having an MRI and a mammogram every year.  Breast cancer (BRCA) gene test is recommended for women who have family members with BRCA-related cancers. Results of the assessment will determine the need for genetic counseling and BRCA1 and for BRCA2 testing. BRCA-related cancers include these types: ? Breast. This occurs in males or females. ? Ovarian. ? Tubal. This may also be called fallopian tube cancer. ? Cancer of the abdominal or pelvic lining (peritoneal cancer). ? Prostate. ? Pancreatic. Cervical, Uterine, and Ovarian Cancer Your health care provider may recommend that you be screened regularly for cancer of the pelvic organs. These include your ovaries, uterus, and vagina. This screening involves a pelvic exam, which includes checking for microscopic changes to the surface of your cervix (Pap test).  For women ages 21-65, health care providers may recommend a pelvic exam and a Pap test every three years. For women ages 39-65, they may recommend the Pap test and pelvic exam, combined with testing for human papilloma virus (HPV), every five years. Some types of HPV increase your risk of cervical cancer. Testing for HPV may also be done on women of any age who have unclear Pap test results.  Other health care providers may not recommend any screening for nonpregnant women who are considered low risk for pelvic cancer and have no symptoms. Ask your health care provider if a screening pelvic exam is right for you.  If you have had past treatment for cervical cancer or a condition that could lead to cancer, you need Pap tests and screening for cancer for at least 20 years after your treatment. If Pap tests have been discontinued for you, your risk factors (such as having a new sexual partner) need to be reassessed  to determine if you should start having screenings again. Some women have medical problems that increase the chance of getting cervical cancer. In these cases, your health care provider may recommend that you have screening and Pap tests more often.  If you have a family history of uterine cancer or ovarian cancer, talk with your health care provider about genetic screening.  If you have vaginal bleeding after reaching menopause, tell your health care provider.  There are currently no reliable tests available to screen for ovarian cancer. Lung Cancer Lung cancer screening is recommended for adults 57-50 years old who are at high risk for lung cancer because of a history of smoking. A yearly low-dose CT scan of the lungs is recommended if you:  Currently smoke.  Have a history of at least 30 pack-years of smoking and you currently smoke or have quit within the past 15 years. A pack-year is smoking an average of one pack of cigarettes per day for one year. Yearly screening should:  Continue until it has been 15 years since you quit.  Stop if you develop a health problem that would prevent you from having lung cancer treatment. Colorectal Cancer  This type of cancer can be detected and can often be prevented.  Routine colorectal cancer screening usually begins at age 12 and continues through  age 63.  If you have risk factors for colon cancer, your health care provider may recommend that you be screened at an earlier age.  If you have a family history of colorectal cancer, talk with your health care provider about genetic screening.  Your health care provider may also recommend using home test kits to check for hidden blood in your stool.  A small camera at the end of a tube can be used to examine your colon directly (sigmoidoscopy or colonoscopy). This is done to check for the earliest forms of colorectal cancer.  Direct examination of the colon should be repeated every 5-10 years until  age 75. However, if early forms of precancerous polyps or small growths are found or if you have a family history or genetic risk for colorectal cancer, you may need to be screened more often. Skin Cancer  Check your skin from head to toe regularly.  Monitor any moles. Be sure to tell your health care provider: ? About any new moles or changes in moles, especially if there is a change in a mole's shape or color. ? If you have a mole that is larger than the size of a pencil eraser.  If any of your family members has a history of skin cancer, especially at a Shayde Gervacio age, talk with your health care provider about genetic screening.  Always use sunscreen. Apply sunscreen liberally and repeatedly throughout the day.  Whenever you are outside, protect yourself by wearing long sleeves, pants, a wide-brimmed hat, and sunglasses. What should I know about osteoporosis? Osteoporosis is a condition in which bone destruction happens more quickly than new bone creation. After menopause, you may be at an increased risk for osteoporosis. To help prevent osteoporosis or the bone fractures that can happen because of osteoporosis, the following is recommended:  If you are 59-59 years old, get at least 1,000 mg of calcium and at least 600 mg of vitamin D per day.  If you are older than age 36 but younger than age 32, get at least 1,200 mg of calcium and at least 600 mg of vitamin D per day.  If you are older than age 47, get at least 1,200 mg of calcium and at least 800 mg of vitamin D per day. Smoking and excessive alcohol intake increase the risk of osteoporosis. Eat foods that are rich in calcium and vitamin D, and do weight-bearing exercises several times each week as directed by your health care provider. What should I know about how menopause affects my mental health? Depression may occur at any age, but it is more common as you become older. Common symptoms of depression include:  Low or sad mood.   Changes in sleep patterns.  Changes in appetite or eating patterns.  Feeling an overall lack of motivation or enjoyment of activities that you previously enjoyed.  Frequent crying spells. Talk with your health care provider if you think that you are experiencing depression. What should I know about immunizations? It is important that you get and maintain your immunizations. These include:  Tetanus, diphtheria, and pertussis (Tdap) booster vaccine.  Influenza every year before the flu season begins.  Pneumonia vaccine.  Shingles vaccine. Your health care provider may also recommend other immunizations. This information is not intended to replace advice given to you by your health care provider. Make sure you discuss any questions you have with your health care provider. Document Released: 03/06/2005 Document Revised: 08/02/2015 Document Reviewed: 10/16/2014 Elsevier Interactive Patient Education  2019 [place].

## 2018-07-19 NOTE — Progress Notes (Signed)
Kelli Bell Sep 16, 1960 [id number]    History:    Presents for annual exam.  2004 TAH with BSO for endometriosis.  No HRT.  Normal Pap and mammogram history.  2012- colonoscopy.  2015 gastric bypass was down 60 pounds and has regained 15.  2014 normal DEXA at primary care.  Primary care manages hypothyroidism.  Struggles with depression husband died 2015-08-28, has seen a grief counselor in the past.  Currently on 20 mg of Lexapro and would like to increase dose to 30 mg.  States it has helped but continues to struggle with some anxiety and depression.  Past medical history, past surgical history, family history and social history were all reviewed and documented in the EPIC chart.  Recently sold her home and is now living in an apartment close to her family.  2 children and 7 grandchildren all doing well.  ROS:  A ROS was performed and pertinent positives and negatives are included.  Exam:  Vitals:   07/19/18 1002  BP: 112/68  Weight: 167 lb 12.8 oz (76.1 kg)  Height: 5' 3.5" (1.613 m)   Body mass index is 29.26 kg/m.   General appearance:  Normal Thyroid:  Symmetrical, normal in size, without palpable masses or nodularity. Respiratory  Auscultation:  Clear without wheezing or rhonchi Cardiovascular  Auscultation:  Regular rate, without rubs, murmurs or gallops  Edema/varicosities:  Not grossly evident Abdominal  Soft,nontender, without masses, guarding or rebound.  Liver/spleen:  No organomegaly noted  Hernia:  None appreciated  Skin  Inspection:  Grossly normal   Breasts: Examined lying and sitting.     Right: Without masses, retractions, discharge or axillary adenopathy.     Left: Without masses, retractions, discharge or axillary adenopathy. Gentitourinary   Inguinal/mons:  Normal without inguinal adenopathy  External genitalia:  Normal  BUS/Urethra/Skene's glands:  Normal  Vagina:  Normal  Cervix: And uterus absent  Adnexa/parametria:     Rt: Without masses or  tenderness.   Lt: Without masses or tenderness.  Anus and perineum: Normal  Digital rectal exam: Normal sphincter tone without palpated masses or tenderness  Assessment/Plan:  58 y.o. W WF G2, P2 for annual exam no GYN complaints, continues to struggle with anxiety and depression.  2004 TAH with BSO for endometriosis on no HRT Hypothyroidism, and chronic pain trigeminal neuralgia  -primary care manages labs and meds 2015 gastric bypass-has regained 15 pounds from last year Anxiety/depression on  Lexapro  Plan: SBEs, continue annual screening mammogram, calcium rich foods, vitamin D 2000 daily encouraged.  Reviewed importance of weightbearing and balance type exercise, yoga encouraged.  Vitamin D 2000 daily encouraged.  Home safety, fall prevention discussed.  Anxiety/depression discussed, states feels much better since starting Lexapro but feels needs a higher dose will try Lexapro 30 mg daily, prescription, proper use given, counseling as needed, instructed to call if continued symptoms, reviewed possible psychiatrist visit for med evaluation.  Reviewed importance of self-care, leisure activities.   [place], 10:22 AM 07/19/2018

## 2018-07-23 MED FILL — LEVOTHYROXINE 88 MCG TABLET: 88 | 90 days supply | Qty: 90 | Fill #3

## 2018-08-14 MED FILL — carBAMazepine ER 100 MG TB1: 100 | 30 days supply | Qty: 120 | Fill #1

## 2018-08-14 MED FILL — LEVOTHYROXINE 88 MCG TABLET: 88 | 90 days supply | Qty: 90 | Fill #3

## 2018-08-14 MED FILL — TOPIRAMATE 50 MG TABLET: 50 | 30 days supply | Qty: 30 | Fill #1

## 2018-08-30 MED FILL — ESCITALOPRAM 20 MG TABLET: 20 | 80 days supply | Qty: 120 | Fill #0

## 2018-08-30 MED FILL — OMEPRAZOLE 20 MG CAP: 20 | 90 days supply | Qty: 180 | Fill #3

## 2018-08-31 ENCOUNTER — Other Ambulatory Visit: Payer: Self-pay

## 2018-08-31 ENCOUNTER — Ambulatory Visit
Admission: RE | Admit: 2018-08-31 | Discharge: 2018-08-31 | Disposition: A | Discharge status: Home / Self Care | Payer: Managed Care, Other (non HMO) | Source: Ambulatory Visit | Attending: Women's Health | Admitting: Women's Health

## 2018-08-31 DIAGNOSIS — Z1231 Encounter for screening mammogram for malignant neoplasm of breast: Secondary | ICD-10-CM

## 2018-09-01 ENCOUNTER — Encounter: Payer: Self-pay | Admitting: Women's Health

## 2018-10-04 ENCOUNTER — Ambulatory Visit (INDEPENDENT_AMBULATORY_CARE_PROVIDER_SITE_OTHER): Payer: Managed Care, Other (non HMO) | Admitting: Internal Medicine

## 2018-10-04 ENCOUNTER — Other Ambulatory Visit (INDEPENDENT_AMBULATORY_CARE_PROVIDER_SITE_OTHER): Payer: Self-pay

## 2018-10-04 ENCOUNTER — Other Ambulatory Visit: Payer: Self-pay

## 2018-10-04 ENCOUNTER — Ambulatory Visit (INDEPENDENT_AMBULATORY_CARE_PROVIDER_SITE_OTHER)
Admission: RE | Admit: 2018-10-04 | Discharge: 2018-10-04 | Disposition: A | Discharge status: Home / Self Care | Payer: Managed Care, Other (non HMO) | Source: Ambulatory Visit | Attending: Internal Medicine | Admitting: Internal Medicine

## 2018-10-04 ENCOUNTER — Encounter: Payer: Self-pay | Admitting: Internal Medicine

## 2018-10-04 VITALS — BP 148/98 | HR 64 | Temp 97.6°F | Ht 63.5 in | Wt 171.0 lb

## 2018-10-04 DIAGNOSIS — Z0001 Encounter for general adult medical examination with abnormal findings: Secondary | ICD-10-CM

## 2018-10-04 DIAGNOSIS — M791 Myalgia, unspecified site: Secondary | ICD-10-CM

## 2018-10-04 DIAGNOSIS — M2550 Pain in unspecified joint: Secondary | ICD-10-CM | POA: Diagnosis not present

## 2018-10-04 DIAGNOSIS — F411 Generalized anxiety disorder: Secondary | ICD-10-CM

## 2018-10-04 DIAGNOSIS — L639 Alopecia areata, unspecified: Secondary | ICD-10-CM | POA: Insufficient documentation

## 2018-10-04 DIAGNOSIS — E538 Deficiency of other specified B group vitamins: Secondary | ICD-10-CM

## 2018-10-04 DIAGNOSIS — E039 Hypothyroidism, unspecified: Secondary | ICD-10-CM

## 2018-10-04 DIAGNOSIS — Z23 Encounter for immunization: Secondary | ICD-10-CM

## 2018-10-04 DIAGNOSIS — E559 Vitamin D deficiency, unspecified: Secondary | ICD-10-CM | POA: Diagnosis not present

## 2018-10-04 DIAGNOSIS — E611 Iron deficiency: Secondary | ICD-10-CM

## 2018-10-04 LAB — CBC WITH DIFFERENTIAL/PLATELET
Basophils Absolute: 0.1 10*3/uL (ref 0.0–0.1)
Basophils Relative: 1.1 % (ref 0.0–3.0)
Eosinophils Absolute: 0.1 10*3/uL (ref 0.0–0.7)
Eosinophils Relative: 1 % (ref 0.0–5.0)
HCT: 38.8 % (ref 36.0–46.0)
Hemoglobin: 13 g/dL (ref 12.0–15.0)
Lymphocytes Relative: 48.4 % — ABNORMAL HIGH (ref 12.0–46.0)
Lymphs Abs: 2.8 10*3/uL (ref 0.7–4.0)
MCHC: 33.6 g/dL (ref 30.0–36.0)
MCV: 91.9 fl (ref 78.0–100.0)
Monocytes Absolute: 0.5 10*3/uL (ref 0.1–1.0)
Monocytes Relative: 9.1 % (ref 3.0–12.0)
Neutro Abs: 2.4 10*3/uL (ref 1.4–7.7)
Neutrophils Relative %: 40.4 % — ABNORMAL LOW (ref 43.0–77.0)
Platelets: 316 10*3/uL (ref 150.0–400.0)
RBC: 4.22 Mil/uL (ref 3.87–5.11)
RDW: 12.7 % (ref 11.5–15.5)
WBC: 5.8 10*3/uL (ref 4.0–10.5)

## 2018-10-04 LAB — SEDIMENTATION RATE: Sed Rate: 8 mm/hr (ref 0–30)

## 2018-10-04 MED ORDER — PREDNISONE 10 MG PO TABS: ORAL_TABLET | ORAL | 0 refills | Status: DC

## 2018-10-04 MED ORDER — METHYLPREDNISOLONE ACETATE 80 MG/ML IJ SUSP
80.0000 mg | Freq: Once | INTRAMUSCULAR | Status: AC
  Administered 2018-10-04: 17:00:00 80 mg via INTRAMUSCULAR

## 2018-10-04 MED FILL — predniSONE 10 MG TABS: 10 | 9 days supply | Qty: 18 | Fill #0

## 2018-10-04 NOTE — Assessment & Plan Note (Signed)

## 2018-10-04 NOTE — Assessment & Plan Note (Signed)
Mild to mod, for labs as ordered,  to f/u any worsening symptoms or concerns

## 2018-10-04 NOTE — Progress Notes (Signed)
Subjective:    Patient ID: Kelli Bell, female    DOB: September 07, 1960, 58 y.o.   MRN: [id number]  HPI    Here for wellness and f/u;  Overall doing ok;  Pt denies Chest pain, worsening SOB, DOE, wheezing, orthopnea, PND, worsening LE edema, palpitations, dizziness or syncope.  Pt denies neurological change such as new headache, facial or extremity weakness.  Pt denies polydipsia, polyuria, or low sugar symptoms. Pt states overall good compliance with treatment and medications, good tolerability, and has been trying to follow appropriate diet.  Pt denies worsening depressive symptoms, suicidal ideation or panic. No fever, night sweats, wt loss, loss of appetite, or other constitutional symptoms.  Pt states good ability with ADL's, has low fall risk, home safety reviewed and adequate, no other significant changes in hearing or vision, and only occasionally active with exercise. Also here with 2 wks with diffuse myalgias and arthralgias to all extremities, gradually worse, dull, now constant, heating pad not really helping, tylenol small help, pennsaid helped that she had leftover, now more moderate, knees worse to go up 14steps to her apartment No fever, gained some wt after husband died.  Has ongoing back pain followed per Dr Ellene Route, but no worse and really doesn't think the pain starts there, it is just chronic anyway.  Lost quite a bit of sleep, and more nervous with the pain.  Also with an area of hair loss in a patch to the crown.  No fever, weakness or falls but wary of doing so.  Denies hyper or hypo thyroid symptoms such as voice, skin or hair change.  Denies worsening depressive symptoms, suicidal ideation, or panic; has ongoing anxiety, now increased recently. BP at home still < 140/90 off her previous BP meds.    Past Medical History:  Diagnosis Date  . ANXIETY 03/30/2007  . BACK PAIN 02/20/2009  . Essential hypertension, benign 05/31/2012  . Family history of anesthesia complication    "mother has hard  time waking up"  . GERD 01/13/2007  . H/O hiatal hernia   . HYPERLIPIDEMIA 02/22/2008  . HYPOTHYROIDISM 03/30/2007  . MENOPAUSAL DISORDER 02/23/2008  . Nocturia   . OBSTRUCTIVE SLEEP APNEA 06/29/2007   has not used c pap x 8 months due to wt loss  . SINUSITIS- ACUTE-NOS 03/30/2007  . THORACIC/LUMBOSACRAL NEURITIS/RADICULITIS UNSPEC 02/23/2008  . Wheezing 01/08/2009   Past Surgical History:  Procedure Laterality Date  . ABDOMINAL HYSTERECTOMY  2004   with BSO  . BREATH TEK H PYLORI N/A 05/05/2013   Procedure: BREATH TEK H PYLORI;  Surgeon: Shann Medal, MD;  Location: [facility];  Service: General;  Laterality: N/A;  . CHOLECYSTECTOMY  1980  . GASTRIC ROUX-EN-Y N/A 09/12/2013   Procedure: LAPAROSCOPIC ROUX-EN-Y GASTRIC BYPASS WITH UPPER ENDOSCOPY WITH ENTEROLYSIS OF ADHESIONS;  Surgeon: Shann Medal, MD;  Location: [facility];  Service: General;  Laterality: N/A;  . jaw bone surgury     58 yo  . TONSILLECTOMY      reports that she quit smoking about 21 years ago. She has never used smokeless tobacco. She reports current alcohol use. She reports that she does not use drugs. family history includes Arthritis in an other family member; Asthma in her brother; Breast cancer in her maternal aunt; Cancer in her mother; Colon polyps (age of onset: 42) in her father; Diabetes in her mother and paternal grandmother; Heart disease in her paternal grandmother; Hypertension in her mother; Lymphoma in her maternal grandmother; Osteoporosis in an  other family member; Stroke in her mother; Thyroid disease in her maternal grandmother and mother. Allergies  Allergen Reactions  . Codeine Nausea Only   Current Outpatient Medications on File Prior to Visit  Medication Sig Dispense Refill  . acetaminophen (TYLENOL) 325 MG tablet Take 650 mg by mouth every 6 (six) hours as needed for mild pain or headache.    Marland Kitchen CALCIUM PO Take 5 mLs by mouth daily.    . carBAMazepine (TEGRETOL PO) Take 100 mg by mouth 2 (two)  times daily.    . Cyanocobalamin (B-12 PO) Take 5 mLs by mouth as needed (low b12).    . Diclofenac Sodium (PENNSAID) 2 % SOLN Place 1 application onto the skin 2 (two) times daily. 1 Bottle 3  . escitalopram (LEXAPRO) 20 MG tablet Take 30 mg daily, 1 and 1/2 tablets daily 120 tablet 4  . gabapentin (NEURONTIN) 100 MG capsule Take 1 capsule (100 mg total) by mouth 3 (three) times daily. 90 capsule 1  . levothyroxine (SYNTHROID, LEVOTHROID) 88 MCG tablet TAKE 1 TABLET BY MOUTH EVERY MORNING. 90 tablet 3  . Multiple Vitamins-Minerals (MULTIVITAMIN ADULT PO) Take 1 tablet by mouth daily.    Marland Kitchen omeprazole (PRILOSEC) 20 MG capsule TAKE 2 CAPSULES (40 MG TOTAL) BY MOUTH DAILY. 180 capsule 3  . topiramate (TOPAMAX) 50 MG tablet Take 1 tablet by mouth daily.    . traMADol (ULTRAM) 50 MG tablet Take 1 tablet (50 mg total) by mouth every 6 (six) hours as needed. 60 tablet 3   No current facility-administered medications on file prior to visit.    Review of Systems Constitutional: Negative for other unusual diaphoresis, sweats, appetite or weight changes HENT: Negative for other worsening hearing loss, ear pain, facial swelling, mouth sores or neck stiffness.   Eyes: Negative for other worsening pain, redness or other visual disturbance.  Respiratory: Negative for other stridor or swelling Cardiovascular: Negative for other palpitations or other chest pain  Gastrointestinal: Negative for worsening diarrhea or loose stools, blood in stool, distention or other pain Genitourinary: Negative for hematuria, flank pain or other change in urine volume.  Musculoskeletal: Negative for myalgias or other joint swelling.  Skin: Negative for other color change, or other wound or worsening drainage.  Neurological: Negative for other syncope or numbness. Hematological: Negative for other adenopathy or swelling Psychiatric/Behavioral: Negative for hallucinations, other worsening agitation, SI, self-injury, or new  decreased concentration ALl other system neg per pt    Objective:   Physical Exam BP (!) 148/98 (BP Location: Left Arm, Patient Position: Sitting, Cuff Size: Normal)   Pulse 64   Temp 97.6 F (36.4 C) (Oral)   Ht 5' 3.5" (1.613 m)   Wt 171 lb (77.6 kg)   SpO2 98%   BMI 29.82 kg/m  VS noted,  Constitutional: Pt is oriented to person, place, and time. Appears well-developed and well-nourished, in no significant distress and comfortable Head: Normocephalic and atraumatic  Eyes: Conjunctivae and EOM are normal. Pupils are equal, round, and reactive to light Right Ear: External ear normal without discharge Left Ear: External ear normal without discharge Nose: Nose without discharge or deformity Mouth/Throat: Oropharynx is without other ulcerations and moist  Neck: Normal range of motion. Neck supple. No JVD present. No tracheal deviation present or significant neck LA or mass Cardiovascular: Normal rate, regular rhythm, normal heart sounds and intact distal pulses.   Pulmonary/Chest: WOB normal and breath sounds without rales or wheezing  Abdominal: Soft. Bowel sounds are normal. NT.  No HSM  Musculoskeletal: Normal range of motion. Exhibits no edema, but hs diffuse tender to extremities upper and distal, no joint effusions Lymphadenopathy: Has no other cervical adenopathy.  Neurological: Pt is alert and oriented to person, place, and time. Pt has normal reflexes. No cranial nerve deficit. Motor grossly intact, Gait intact Skin: Skin is warm and dry. No rash noted or new ulcerations Psychiatric:  Has nervous mood and affect. Behavior is normal without agitation No other exam findings  Lab Results  Component Value Date   WBC 5.0 12/21/2017   HGB 13.1 12/21/2017   HCT 39.1 12/21/2017   PLT 310.0 12/21/2017   GLUCOSE 89 12/21/2017   CHOL 216 (H) 11/01/2017   TRIG 72.0 11/01/2017   HDL 101.50 11/01/2017   LDLDIRECT 94.8 04/29/2012   LDLCALC 100 (H) 11/01/2017   ALT 13 11/01/2017    AST 16 11/01/2017   NA 138 12/21/2017   K 4.3 12/21/2017   CL 102 12/21/2017   CREATININE 0.55 12/21/2017   BUN 12 12/21/2017   CO2 29 12/21/2017   TSH 1.14 07/14/2017   HGBA1C 5.4 08/13/2017      Assessment & Plan:

## 2018-10-04 NOTE — Assessment & Plan Note (Signed)
Moderate, but doubt the primary issue, cont same tx, declines change in tx

## 2018-10-04 NOTE — Assessment & Plan Note (Addendum)
mod, for labs as ordered, ?polymyositis vs other,  to f/u any worsening symptoms or concerns  In addition to the time spent performing CPE, I spent an additional 25 minutes face to face,in which greater than 50% of this time was spent in counseling and coordination of care for patient's acute illness as documented, including the differential dx, treatment, further evaluation and other management of myalgia, polyarthralgia, alopecia, hypothyroidism, anxiety

## 2018-10-04 NOTE — Assessment & Plan Note (Signed)
stable overall by history and exam, recent data reviewed with pt, and pt to continue medical treatment as before,  to f/u any worsening symptoms or concerns  

## 2018-10-04 NOTE — Patient Instructions (Addendum)
You had the flu shot today  You had the steroid shot today  Please take all new medication as prescribed - the prednisone  Please continue all other medications as before, and refills have been done if requested.  Please have the pharmacy call with any other refills you may need.  Please continue your efforts at being more active, low cholesterol diet, and weight control.  You are otherwise up to date with prevention measures today.  Please keep your appointments with your specialists as you may have planned  Please go to the XRAY Department in the Basement (go straight as you get off the elevator) for the x-ray testing  Please go to the LAB in the Basement (turn left off the elevator) for the tests to be done today  You will be contacted by phone if any changes need to be made immediately.  Otherwise, you will receive a letter about your results with an explanation, but please check with MyChart first.  Please remember to sign up for MyChart if you have not done so, as this will be important to you in the future with finding out test results, communicating by private email, and scheduling acute appointments online when needed.  Please return in 6 months, or sooner if needed

## 2018-10-04 NOTE — Assessment & Plan Note (Signed)
D/w pt, may improve with steroid tx above, declines derm referral for steroid injections for now

## 2018-10-05 ENCOUNTER — Ambulatory Visit: Payer: Managed Care, Other (non HMO) | Admitting: Internal Medicine

## 2018-10-05 LAB — URINALYSIS, ROUTINE W REFLEX MICROSCOPIC
Bilirubin Urine: NEGATIVE
Hgb urine dipstick: NEGATIVE
Ketones, ur: NEGATIVE
Leukocytes,Ua: NEGATIVE
Nitrite: NEGATIVE
RBC / HPF: NONE SEEN (ref 0–?)
Specific Gravity, Urine: 1.02 (ref 1.000–1.030)
Total Protein, Urine: NEGATIVE
Urine Glucose: NEGATIVE
Urobilinogen, UA: 0.2 (ref 0.0–1.0)
WBC, UA: NONE SEEN (ref 0–?)
pH: 7 (ref 5.0–8.0)

## 2018-10-05 LAB — BASIC METABOLIC PANEL
BUN: 9 mg/dL (ref 6–23)
CO2: 24 mEq/L (ref 19–32)
Calcium: 9 mg/dL (ref 8.4–10.5)
Chloride: 105 mEq/L (ref 96–112)
Creatinine, Ser: 0.57 mg/dL (ref 0.40–1.20)
GFR: 108.62 mL/min (ref >60.00)
Glucose, Bld: 85 mg/dL (ref 70–99)
Potassium: 3.9 mEq/L (ref 3.5–5.1)
Sodium: 139 mEq/L (ref 135–145)

## 2018-10-05 LAB — IBC PANEL
Iron: 76 ug/dL (ref 42–145)
Saturation Ratios: 20.1 % (ref 20.0–50.0)
Transferrin: 270 mg/dL (ref 212.0–360.0)

## 2018-10-05 LAB — C-REACTIVE PROTEIN: CRP: <1 mg/dL (ref 0.5–20.0)

## 2018-10-05 LAB — VITAMIN B12: Vitamin B-12: 714 pg/mL (ref 211–911)

## 2018-10-05 LAB — HEPATIC FUNCTION PANEL
ALT: 17 U/L (ref 0–35)
AST: 20 U/L (ref 0–37)
Albumin: 4.3 g/dL (ref 3.5–5.2)
Alkaline Phosphatase: 107 U/L (ref 39–117)
Bilirubin, Direct: 0 mg/dL (ref 0.0–0.3)
Total Bilirubin: 0.2 mg/dL (ref 0.2–1.2)
Total Protein: 6.8 g/dL (ref 6.0–8.3)

## 2018-10-05 LAB — CK: Total CK: 82 U/L (ref 7–177)

## 2018-10-05 LAB — LIPID PANEL
Cholesterol: 238 mg/dL — ABNORMAL HIGH (ref 0–200)
HDL: 99.4 mg/dL (ref >39.00)
LDL Cholesterol: 123 mg/dL — ABNORMAL HIGH (ref 0–99)
NonHDL: 139.05
Total CHOL/HDL Ratio: 2
Triglycerides: 78 mg/dL (ref 0.0–149.0)
VLDL: 15.6 mg/dL (ref 0.0–40.0)

## 2018-10-05 LAB — VITAMIN D 25 HYDROXY (VIT D DEFICIENCY, FRACTURES): VITD: 35.78 ng/mL (ref 30.00–100.00)

## 2018-10-05 LAB — TSH: TSH: 1.42 u[IU]/mL (ref 0.35–4.50)

## 2018-10-06 LAB — ANTI-NUCLEAR AB-TITER (ANA TITER)
ANA TITER: 1:160 {titer} — ABNORMAL HIGH
ANA TITER: 1:80 {titer} — ABNORMAL HIGH
ANA Titer 1: 1:80 {titer} — ABNORMAL HIGH

## 2018-10-06 LAB — CYCLIC CITRUL PEPTIDE ANTIBODY, IGG: Cyclic Citrullin Peptide Ab: <16 UNITS

## 2018-10-06 LAB — RHEUMATOID FACTOR: Rheumatoid fact SerPl-aCnc: <14 IU/mL (ref <14)

## 2018-10-06 LAB — ANA: Anti Nuclear Antibody (ANA): POSITIVE — AB

## 2018-10-11 ENCOUNTER — Telehealth: Payer: Self-pay

## 2018-10-11 ENCOUNTER — Encounter: Payer: Self-pay | Admitting: Internal Medicine

## 2018-10-11 ENCOUNTER — Other Ambulatory Visit: Payer: Self-pay | Admitting: Internal Medicine

## 2018-10-11 DIAGNOSIS — R768 Other specified abnormal immunological findings in serum: Secondary | ICD-10-CM

## 2018-10-11 NOTE — Telephone Encounter (Signed)
Pt has been informed of results and expressed understanding.  °

## 2018-10-11 NOTE — Telephone Encounter (Signed)
Copied from [place] [id number]. Topic: General - Other >> Oct 11, 2018 11:09 AM Jodie Echevaria wrote: Reason for CRM: Patient called to get the results of the ANA test that was done a week ago she said. Per patient she was to have the results in 3 days and she is still asked for a call from the nurse at Ph# [phone]

## 2018-10-11 NOTE — Telephone Encounter (Signed)
I am not sure why the result was not released, but this is it:  To Whom It May Concern:  Anti-nuclear ab-titer (ANA titer) Order: [id number] Status:  Final result Visible to patient:  No (not released) Next appt:  11/15/2018 at 09:00 AM in Internal Medicine Cathlean Cower, MD)  Ref Range & Units 7d ago  ANA Titer 1 titer 1:80High    Comment: A low level ANA titer may be present in pre-clinical  autoimmune diseases and normal individuals.          Reference Range          <1:40    Negative          1:40-1:80  Low Antibody Level          >1:80    Elevated Antibody Level  .   ANA Pattern 1  Nuclear, SpeckledAbnormal    Comment: Speckled pattern is associated with mixed connective  tissue disease (MCTD), systemic lupus erythematosus  (SLE), Sjogren's syndrome, dermatomyositis, and  systemic sclerosis/polymyositis overlap.  .  AC-2,4,5,29: Speckled  .  International Consensus on ANA Patterns  ([email or web address]   ANA TITER titer 1:160High    Comment:        Reference Range          <1:40    Negative          1:40-1:80  Low Antibody Level          >1:80    Elevated Antibody Level  .   ANA PATTERN  Nuclear, NucleolarAbnormal    Comment: Nucleolar pattern is associated with systemic sclerosis  (scleroderma), systemic sclerosis/polymyositis overlap  and Sjogren's syndrome.  .  AC-8,9,10: Nucleolar  .  International Consensus on ANA Patterns  ([email or web address]   ANA TITER titer 1:80High    Comment: A low level ANA titer may be present in pre-clinical  autoimmune diseases and normal individuals.          Reference Range          <1:40    Negative          1:40-1:80  Low Antibody Level          >1:80    Elevated Antibody Level  .   ANA PATTERN  Nuclear, HomogeneousAbnormal    Comment: Homogeneous pattern is  associated with systemic lupus  erythematosus (SLE), drug-induced lupus and juvenile  idiopathic arthritis.  .  AC-1: Homogeneous  .  International Consensus on ANA Patterns  ([email or web address]   Resulting Agency  [facility]      Specimen Collected: 10/04/18 17:12 Last Resulted: 10/06/18 18:03          Although the ANA positive result is not specific for a specific etiology, I will refer to rheumatology in case further evaluation and treatment is needed.

## 2018-10-17 ENCOUNTER — Telehealth: Payer: Self-pay

## 2018-10-17 NOTE — Telephone Encounter (Signed)
Copied from [place] [id number]. Topic: General - Other >> Oct 17, 2018 12:56 PM Richardo Priest, NT wrote: Reason for CRM: Patient called in stating she is needing office to fax over her lab results from 10/04/18, as well as office notes from 10/04/18, to the [facility] so she can have an appointment with them. Fax number is [phone], attention to [facility]. Please advise. Patient is wanting call at [phone] when done.

## 2018-10-17 NOTE — Telephone Encounter (Signed)
Referral sent and left a detailed vm for pt

## 2018-11-11 MED FILL — ESCITALOPRAM 20 MG TABLET: 20 | 80 days supply | Qty: 120 | Fill #1

## 2018-11-15 ENCOUNTER — Encounter: Payer: Managed Care, Other (non HMO) | Admitting: Internal Medicine

## 2018-11-16 DIAGNOSIS — M2550 Pain in unspecified joint: Secondary | ICD-10-CM | POA: Diagnosis not present

## 2018-11-16 DIAGNOSIS — R768 Other specified abnormal immunological findings in serum: Secondary | ICD-10-CM | POA: Diagnosis not present

## 2018-11-16 MED FILL — predniSONE 20 MG TABS: 20 | 30 days supply | Qty: 30 | Fill #0

## 2018-11-22 ENCOUNTER — Other Ambulatory Visit: Payer: Self-pay | Admitting: Women's Health

## 2018-11-22 DIAGNOSIS — E039 Hypothyroidism, unspecified: Secondary | ICD-10-CM

## 2018-11-22 MED FILL — LEVOTHYROXINE 88 MCG TABLET: 88 | 90 days supply | Qty: 90 | Fill #0

## 2018-11-23 DIAGNOSIS — M2550 Pain in unspecified joint: Secondary | ICD-10-CM | POA: Diagnosis not present

## 2018-11-23 DIAGNOSIS — L659 Nonscarring hair loss, unspecified: Secondary | ICD-10-CM | POA: Diagnosis not present

## 2018-11-23 DIAGNOSIS — R768 Other specified abnormal immunological findings in serum: Secondary | ICD-10-CM | POA: Diagnosis not present

## 2018-11-23 MED FILL — TOPIRAMATE 50 MG TABLET: 50 | 30 days supply | Qty: 30 | Fill #0

## 2018-11-23 MED FILL — carBAMazepine ER 100 MG TB1: 100 | 30 days supply | Qty: 120 | Fill #0

## 2018-11-23 MED FILL — HYDROXYCHLOROQUINE 200 MG T: 200 | 30 days supply | Qty: 60 | Fill #0

## 2018-12-01 ENCOUNTER — Other Ambulatory Visit: Payer: Self-pay | Admitting: Internal Medicine

## 2018-12-01 DIAGNOSIS — K2100 Gastro-esophageal reflux disease with esophagitis, without bleeding: Secondary | ICD-10-CM

## 2018-12-01 MED FILL — OMEPRAZOLE 20 MG CAP: 20 | 90 days supply | Qty: 180 | Fill #0

## 2018-12-18 ENCOUNTER — Encounter: Payer: Self-pay | Admitting: Internal Medicine

## 2018-12-18 DIAGNOSIS — M2550 Pain in unspecified joint: Secondary | ICD-10-CM

## 2018-12-27 ENCOUNTER — Other Ambulatory Visit: Payer: Self-pay

## 2018-12-27 DIAGNOSIS — M2550 Pain in unspecified joint: Secondary | ICD-10-CM | POA: Diagnosis not present

## 2018-12-27 MED FILL — TOPIRAMATE 50 MG TABLET: 50 | 30 days supply | Qty: 30 | Fill #1

## 2018-12-27 MED FILL — carBAMazepine ER 100 MG TB1: 100 | 30 days supply | Qty: 120 | Fill #1

## 2018-12-27 MED FILL — HYDROXYCHLOROQUINE 200 MG T: 200 | 30 days supply | Qty: 60 | Fill #1

## 2018-12-27 NOTE — Addendum Note (Signed)
Addended by: Karren Cobble on: 12/27/2018 04:49 PM   Modules accepted: Orders

## 2018-12-29 LAB — LYMEAB(IGG/M)+RMSF(IGG/M)
LYME DISEASE AB, QUANT, IGM: <0.8 index (ref 0.00–0.79)
Lyme IgG/IgM Ab: <0.91 {ISR} (ref 0.00–0.90)
RMSF IgG: NEGATIVE
RMSF IgM: 0.21 index (ref 0.00–0.89)

## 2018-12-30 DIAGNOSIS — L658 Other specified nonscarring hair loss: Secondary | ICD-10-CM | POA: Diagnosis not present

## 2018-12-30 DIAGNOSIS — L988 Other specified disorders of the skin and subcutaneous tissue: Secondary | ICD-10-CM | POA: Diagnosis not present

## 2019-01-12 DIAGNOSIS — L639 Alopecia areata, unspecified: Secondary | ICD-10-CM | POA: Diagnosis not present

## 2019-01-12 MED FILL — CLOBETASOL 0.05% SOLUTION: 0.05 | 14 days supply | Qty: 50 | Fill #0

## 2019-01-30 MED FILL — HYDROXYCHLOROQUINE 200 MG T: 200 | 30 days supply | Qty: 60 | Fill #2

## 2019-02-21 DIAGNOSIS — L639 Alopecia areata, unspecified: Secondary | ICD-10-CM | POA: Diagnosis not present

## 2019-02-21 DIAGNOSIS — Z23 Encounter for immunization: Secondary | ICD-10-CM | POA: Diagnosis not present

## 2019-02-22 DIAGNOSIS — R768 Other specified abnormal immunological findings in serum: Secondary | ICD-10-CM | POA: Diagnosis not present

## 2019-02-22 DIAGNOSIS — M2550 Pain in unspecified joint: Secondary | ICD-10-CM | POA: Diagnosis not present

## 2019-02-22 DIAGNOSIS — L659 Nonscarring hair loss, unspecified: Secondary | ICD-10-CM | POA: Diagnosis not present

## 2019-02-27 ENCOUNTER — Other Ambulatory Visit: Payer: Self-pay

## 2019-02-27 ENCOUNTER — Encounter: Payer: Self-pay | Admitting: Internal Medicine

## 2019-02-27 ENCOUNTER — Ambulatory Visit: Payer: BC Managed Care – PPO | Admitting: Internal Medicine

## 2019-02-27 VITALS — BP 142/80 | Temp 98.0°F | Ht 63.5 in | Wt 180.2 lb

## 2019-02-27 DIAGNOSIS — M545 Low back pain, unspecified: Secondary | ICD-10-CM

## 2019-02-27 DIAGNOSIS — M25561 Pain in right knee: Secondary | ICD-10-CM | POA: Diagnosis not present

## 2019-02-27 DIAGNOSIS — M25551 Pain in right hip: Secondary | ICD-10-CM

## 2019-02-27 DIAGNOSIS — F329 Major depressive disorder, single episode, unspecified: Secondary | ICD-10-CM

## 2019-02-27 DIAGNOSIS — M25511 Pain in right shoulder: Secondary | ICD-10-CM

## 2019-02-27 DIAGNOSIS — F32A Depression, unspecified: Secondary | ICD-10-CM

## 2019-02-27 DIAGNOSIS — M2550 Pain in unspecified joint: Secondary | ICD-10-CM

## 2019-02-27 MED ORDER — DULOXETINE HCL 60 MG PO CPEP: 60.0000 mg | ORAL_CAPSULE | Freq: Every day | ORAL | 3 refills | Status: DC

## 2019-02-27 MED FILL — DULOXETINE HCL 60 MG CPEP: 60 | 90 days supply | Qty: 90 | Fill #0

## 2019-02-27 NOTE — Patient Instructions (Signed)
You are on the [facility] Vaccine Wait List  Ok to stop the lexapro  Please take all new medication as prescribed - the cymbalta 60 mg per day  Please stop at the first floor to make an appt with Sports Medicine for hip, knee and shoulder pain  You will be contacted regarding the referral for: Physical Therapy  You will be contacted regarding the referral for: Pain management  Please continue all other medications as before, and refills have been done if requested.  Please have the pharmacy call with any other refills you may need.  Please keep your appointments with your specialists as you may have planned

## 2019-02-27 NOTE — Assessment & Plan Note (Signed)
Mostly knee, hip, shoulder and back - for sports med referral, PT reerral, cymbalta 60, refer pain management  I spent 40 minutes preparing to see the patient by review of recent labs, imaging and procedures, obtaining and reviewing separately obtained history, communicating with the patient and family or caregiver, ordering medications, tests or procedures, and documenting clinical information in the EHR including the differential Dx, treatment, and any further evaluation and other management of polyarthralgia, depression

## 2019-02-27 NOTE — Assessment & Plan Note (Signed)
Noank for d/c lexapro, - for cymbalta as above

## 2019-02-27 NOTE — Progress Notes (Signed)
Subjective:    Patient ID: Kelli Bell, female    DOB: Mar 09, 1960, 59 y.o.   MRN: [id number]  HPI  Here to f/u joint pain, has seen rheum with positive ANA, has tried empiric HCQ x 3 mo but did not seem to help and referred back here, which she states she is upset and daughter is on pone facetime as well today.  Did have some xrays which are not available here, and I have not seen office report as well on chart.  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  Suggested PT, and here for pain management - knee/hip/shoulder with mild OA, lumbar with mod to severe OA.  Has not been actually referred for PT yet.  Mentioned cymabalta.  [place] with lots of criticizing statements regarind evaluation , tx and information and non referral for PT.  Has not had cortisone shots.  Already taking tramadol for back and trigeminal neuralgia with tegretol and topamax.   Past Medical History:  Diagnosis Date  . ANXIETY 03/30/2007  . BACK PAIN 02/20/2009  . Essential hypertension, benign 05/31/2012  . Family history of anesthesia complication    "mother has hard time waking up"  . GERD 01/13/2007  . H/O hiatal hernia   . HYPERLIPIDEMIA 02/22/2008  . HYPOTHYROIDISM 03/30/2007  . MENOPAUSAL DISORDER 02/23/2008  . Nocturia   . OBSTRUCTIVE SLEEP APNEA 06/29/2007   has not used c pap x 8 months due to wt loss  . Osteoarthritis   . SINUSITIS- ACUTE-NOS 03/30/2007  . THORACIC/LUMBOSACRAL NEURITIS/RADICULITIS UNSPEC 02/23/2008  . Wheezing 01/08/2009   Past Surgical History:  Procedure Laterality Date  . ABDOMINAL HYSTERECTOMY  2004   with BSO  . BREATH TEK H PYLORI N/A 05/05/2013   Procedure: BREATH TEK H PYLORI;  Surgeon: Shann Medal, MD;  Location: [facility];  Service: General;  Laterality: N/A;  . CHOLECYSTECTOMY  1980  . GASTRIC ROUX-EN-Y N/A 09/12/2013   Procedure: LAPAROSCOPIC ROUX-EN-Y GASTRIC BYPASS WITH UPPER ENDOSCOPY WITH ENTEROLYSIS OF ADHESIONS;  Surgeon: Shann Medal, MD;  Location: [facility];  Service:  General;  Laterality: N/A;  . jaw bone surgury     59 yo  . TONSILLECTOMY      reports that she quit smoking about 21 years ago. She has never used smokeless tobacco. She reports current alcohol use. She reports that she does not use drugs. family history includes Arthritis in an other family member; Asthma in her brother; Breast cancer in her maternal aunt; Cancer in her mother; Colon polyps (age of onset: 44) in her father; Diabetes in her mother and paternal grandmother; Heart disease in her paternal grandmother; Hypertension in her mother; Lymphoma in her maternal grandmother; Osteoporosis in an other family member; Stroke in her mother; Thyroid disease in her maternal grandmother and mother. Allergies  Allergen Reactions  . Codeine Nausea Only   Current Outpatient Medications on File Prior to Visit  Medication Sig Dispense Refill  . acetaminophen (TYLENOL) 325 MG tablet Take 650 mg by mouth every 6 (six) hours as needed for mild pain or headache.    Marland Kitchen CALCIUM PO Take 5 mLs by mouth daily.    . Cyanocobalamin (B-12 PO) Take 5 mLs by mouth as needed (low b12).    . escitalopram (LEXAPRO) 20 MG tablet Take 30 mg daily, 1 and 1/2 tablets daily 120 tablet 4  . levothyroxine (SYNTHROID) 88 MCG tablet TAKE 1 TABLET BY MOUTH EVERY MORNING. 90 tablet 3  . Multiple Vitamins-Minerals (  MULTIVITAMIN ADULT PO) Take 1 tablet by mouth daily.    Marland Kitchen topiramate (TOPAMAX) 50 MG tablet Take 1 tablet by mouth daily.    . traMADol (ULTRAM) 50 MG tablet Take 1 tablet (50 mg total) by mouth every 6 (six) hours as needed. 60 tablet 3  . carBAMazepine (TEGRETOL PO) Take 100 mg by mouth 2 (two) times daily.    . clobetasol (TEMOVATE) 0.05 % external solution     . Diclofenac Sodium (PENNSAID) 2 % SOLN Place 1 application onto the skin 2 (two) times daily. 1 Bottle 3  . gabapentin (NEURONTIN) 100 MG capsule Take 1 capsule (100 mg total) by mouth 3 (three) times daily. (Patient not taking: Reported on 02/27/2019) 90  capsule 1  . omeprazole (PRILOSEC) 20 MG capsule TAKE 2 CAPSULES BY MOUTH DAILY. 180 capsule 1  . predniSONE (DELTASONE) 10 MG tablet 3 tabs by mouth per day for 3 days,2tabs per day for 3 days,1tab per day for 3 days (Patient not taking: Reported on 02/27/2019) 18 tablet 0   No current facility-administered medications on file prior to visit.   Review of Systems All otherwise neg per pt     Objective:   Physical Exam BP (!) 142/80   Temp 98 F (36.7 C) (Oral)   Ht 5' 3.5" (1.613 m)   Wt 180 lb 3.2 oz (81.7 kg)   BMI 31.42 kg/m  VS noted,  Constitutional: Pt appears in NAD HENT: Head: NCAT.  Right Ear: External ear normal.  Left Ear: External ear normal.  Eyes: . Pupils are equal, round, and reactive to light. Conjunctivae and EOM are normal Nose: without d/c or deformity Neck: Neck supple. Gross normal ROM Cardiovascular: Normal rate and regular rhythm.   Pulmonary/Chest: Effort normal and breath sounds without rales or wheezing.  Abd:  Soft, NT, ND, + BS, no organomegaly Neurological: Pt is alert. At baseline orientation, motor grossly intact Skin: Skin is warm. No rashes, other new lesions, no LE edema Psychiatric: Pt behavior is normal without agitation , 1-2+ nervous All otherwise neg per pt Lab Results  Component Value Date   WBC 5.8 10/04/2018   HGB 13.0 10/04/2018   HCT 38.8 10/04/2018   PLT 316.0 10/04/2018   GLUCOSE 85 10/04/2018   CHOL 238 (H) 10/04/2018   TRIG 78.0 10/04/2018   HDL 99.40 10/04/2018   LDLDIRECT 94.8 04/29/2012   LDLCALC 123 (H) 10/04/2018   ALT 17 10/04/2018   AST 20 10/04/2018   NA 139 10/04/2018   K 3.9 10/04/2018   CL 105 10/04/2018   CREATININE 0.57 10/04/2018   BUN 9 10/04/2018   CO2 24 10/04/2018   TSH 1.42 10/04/2018   HGBA1C 5.4 08/13/2017      Assessment & Plan:

## 2019-03-04 ENCOUNTER — Encounter: Payer: Self-pay | Admitting: Family Medicine

## 2019-03-06 NOTE — Progress Notes (Signed)
[place] Phone: [phone] Subjective:   [name], am serving as a scribe for Dr. Hulan Saas. This visit occurred during the SARS-CoV-2 public health emergency.  Safety protocols were in place, including screening questions prior to the visit, additional usage of staff PPE, and extensive cleaning of exam room while observing appropriate contact time as indicated for disinfecting solutions.   I'm seeing this patient by the request  of:  Biagio Borg, MD  CC: Shoulder pain, leg pain  [id number]  Kelli Bell is a 59 y.o. female coming in with complaint of right shoulder and hip pain. Patient states that she has been having pain since July 2020. Constant pain over anterior aspect. No known injury.   Also having right hip and knee pain since July 2020. Pain is over lateral aspect of the hip. Patient sleeps in a recliner. Knee pain is over medial aspect.  Patient describes the pain as a dull, throbbing aching sensation.  Has seen multiple different providers for this as well as x-ray.  Patient's pain is significantly exacerbated.  Patient did have x-rays from an outside facility that were independently visualized by me showing the patient does have moderate to severe degenerative disc disease especially at L4-5 and L5-S1.  Patient states that his pain is severe night.  Right shoulder pain this is a diffuse pain.  Sometimes gets in the way of even daily activities.  Patient is supposed to be starting formal physical therapy.  Sometimes some mild radiation down the arm.  Patient has seen a rheumatologist with no significant benefit.  Patient was on prednisone and Plaquenil with wanting to not be in pain all the time and is wondering what else can be done.     Past Medical History:  Diagnosis Date  . ANXIETY 03/30/2007  . BACK PAIN 02/20/2009  . Essential hypertension, benign 05/31/2012  . Family history of  anesthesia complication    "mother has hard time waking up"  . GERD 01/13/2007  . H/O hiatal hernia   . HYPERLIPIDEMIA 02/22/2008  . HYPOTHYROIDISM 03/30/2007  . MENOPAUSAL DISORDER 02/23/2008  . Nocturia   . OBSTRUCTIVE SLEEP APNEA 06/29/2007   has not used c pap x 8 months due to wt loss  . Osteoarthritis   . SINUSITIS- ACUTE-NOS 03/30/2007  . THORACIC/LUMBOSACRAL NEURITIS/RADICULITIS UNSPEC 02/23/2008  . Wheezing 01/08/2009   Past Surgical History:  Procedure Laterality Date  . ABDOMINAL HYSTERECTOMY  2004   with BSO  . BREATH TEK H PYLORI N/A 05/05/2013   Procedure: BREATH TEK H PYLORI;  Surgeon: Shann Medal, MD;  Location: [facility];  Service: General;  Laterality: N/A;  . CHOLECYSTECTOMY  1980  . GASTRIC ROUX-EN-Y N/A 09/12/2013   Procedure: LAPAROSCOPIC ROUX-EN-Y GASTRIC BYPASS WITH UPPER ENDOSCOPY WITH ENTEROLYSIS OF ADHESIONS;  Surgeon: Shann Medal, MD;  Location: [facility];  Service: General;  Laterality: N/A;  . jaw bone surgury     59 yo  . TONSILLECTOMY     Social History   Socioeconomic History  . Marital status: Widowed    Spouse name: Not on file  . Number of children: Not on file  . Years of education: Not on file  . Highest education level: Not on file  Occupational History  . Not on file  Tobacco Use  . Smoking status: Former Smoker    Quit date: 07/06/1997    Years since quitting: 21.6  . Smokeless tobacco:  Never Used  Substance and Sexual Activity  . Alcohol use: Yes    Comment: [place]  . Drug use: No  . Sexual activity: Not Currently    Birth control/protection: Surgical    Comment: intercourse age 31, less than 5 sexual partners,des neg  Other Topics Concern  . Not on file  Social History Narrative  . Not on file   Social Determinants of Health   Financial Resource Strain:   . Difficulty of Paying Living Expenses: Not on file  Food Insecurity:   . Worried About Charity fundraiser in the Last Year: Not on file  . Ran  Out of Food in the Last Year: Not on file  Transportation Needs:   . Lack of Transportation (Medical): Not on file  . Lack of Transportation (Non-Medical): Not on file  Physical Activity:   . Days of Exercise per Week: Not on file  . Minutes of Exercise per Session: Not on file  Stress:   . Feeling of Stress : Not on file  Social Connections:   . Frequency of Communication with Friends and Family: Not on file  . Frequency of Social Gatherings with Friends and Family: Not on file  . Attends Religious Services: Not on file  . Active Member of Clubs or Organizations: Not on file  . Attends Archivist Meetings: Not on file  . Marital Status: Not on file   Allergies  Allergen Reactions  . Codeine Nausea Only   Family History  Problem Relation Age of Onset  . Stroke Mother   . Hypertension Mother   . Diabetes Mother   . Cancer Mother        basel cell cancer  . Thyroid disease Mother   . Arthritis Other   . Colon polyps Father 76       "CANCEROUS POLYPS REMOVED THIS YEAR"  . Asthma Brother   . Thyroid disease Maternal Grandmother   . Lymphoma Maternal Grandmother   . Osteoporosis Other   . Breast cancer Maternal Aunt        mat great aunt  . Diabetes Paternal Grandmother   . Heart disease Paternal Grandmother   . Colon cancer Neg Hx     Current Outpatient Medications (Endocrine & Metabolic):  .  levothyroxine (SYNTHROID) 88 MCG tablet, TAKE 1 TABLET BY MOUTH EVERY MORNING.    Current Outpatient Medications (Analgesics):  .  acetaminophen (TYLENOL) 325 MG tablet, Take 650 mg by mouth every 6 (six) hours as needed for mild pain or headache. .  traMADol (ULTRAM) 50 MG tablet, Take 1 tablet (50 mg total) by mouth every 6 (six) hours as needed.  Current Outpatient Medications (Hematological):  Marland Kitchen  Cyanocobalamin (B-12 PO), Take 5 mLs by mouth as needed (low b12).  Current Outpatient Medications (Other):  Marland Kitchen  CALCIUM PO, Take 5 mLs by mouth daily. .  carBAMazepine  (TEGRETOL PO), Take 100 mg by mouth 2 (two) times daily. .  clobetasol (TEMOVATE) 0.05 % external solution,  .  Diclofenac Sodium (PENNSAID) 2 % SOLN, Place 1 application onto the skin 2 (two) times daily. .  DULoxetine (CYMBALTA) 60 MG capsule, Take 1 capsule (60 mg total) by mouth daily. .  Multiple Vitamins-Minerals (MULTIVITAMIN ADULT PO), Take 1 tablet by mouth daily. Marland Kitchen  omeprazole (PRILOSEC) 20 MG capsule, TAKE 2 CAPSULES BY MOUTH DAILY. Marland Kitchen  topiramate (TOPAMAX) 50 MG tablet, Take 1 tablet by mouth daily. Marland Kitchen  gabapentin (NEURONTIN) 100 MG capsule, Take 2  capsules (200 mg total) by mouth at bedtime.   Reviewed prior external information including notes and imaging from  primary care provider As well as notes that were available from care everywhere and other healthcare systems.  Past medical history, social, surgical and family history all reviewed in electronic medical record.  No pertanent information unless stated regarding to the chief complaint.   Review of Systems:  No visual changes, nausea, vomiting, diarrhea, constipation, dizziness, abdominal pain, skin rash, fevers, chills, night sweats, weight loss, swollen lymph nodes, , joint swelling, chest pain, shortness of breath, mood changes. POSITIVE muscle aches, body aches, headaches  Objective  Blood pressure 110/78, pulse 74, height 5' 3.5" (1.613 m), weight 178 lb (80.7 kg), SpO2 99 %.   General: No apparent distress alert and oriented x3 mood and affect normal, dressed appropriately.  HEENT: Pupils equal, extraocular movements intact  Respiratory: Patient's speak in full sentences and does not appear short of breath  Cardiovascular: No lower extremity edema, non tender, no erythema  Skin: Warm dry intact with no signs of infection or rash on extremities or on axial skeleton.  Abdomen: Soft nontender  Neuro: Cranial nerves II through XII are intact Lymph: No lymphadenopathy of posterior or anterior cervical chain or axillae  bilaterally.  Gait antalgic walking with the aid of a cane MSK:  tender with mild limited range of motion decreased tenderness to palpation to light palpation of multiple different muscles.  This includes shoulders right greater than left, elbows) left, hip right greater than left, right greater than left.  Mild instability of the knee noted with valgus force with some abnormal thigh to calf ratio.  Back exam severe tenderness even light palpation in the paraspinal musculature.  Patient has a positive straight leg test.  Patient does have some mild weakness on the right lower extremity.  Deep tendon reflexes is 1+  [id number]; 15 additional minutes spent for Therapeutic exercises as stated in above notes.  This included exercises focusing on stretching, strengthening, with significant focus on eccentric aspects.   Long term goals include an improvement in range of motion, strength, endurance as well as avoiding reinjury. Patient's frequency would include in 1-2 times a day, 3-5 times a week for a duration of 6-12 weeks. Low back exercises that included:  Pelvic tilt/bracing instruction to focus on control of the pelvic girdle and lower abdominal muscles  Glute strengthening exercises, focusing on proper firing of the glutes without engaging the low back muscles Proper stretching techniques for maximum relief for the hamstrings, hip flexors, low back and some rotation where tolerated    Proper technique shown and discussed handout in great detail with ATC.  All questions were discussed and answered.        Impression and Recommendations:     This case required medical decision making of moderate complexity. The above documentation has been reviewed and is accurate and complete Lyndal Pulley, DO       Note: This dictation was prepared with Dragon dictation along with smaller phrase technology. Any transcriptional errors that result from this process are unintentional.

## 2019-03-06 NOTE — Telephone Encounter (Signed)
Patient's daughter called following up to see if these were received,

## 2019-03-07 ENCOUNTER — Encounter: Payer: Self-pay | Admitting: Family Medicine

## 2019-03-07 ENCOUNTER — Other Ambulatory Visit: Payer: Self-pay

## 2019-03-07 ENCOUNTER — Ambulatory Visit (INDEPENDENT_AMBULATORY_CARE_PROVIDER_SITE_OTHER): Payer: BC Managed Care – PPO

## 2019-03-07 ENCOUNTER — Ambulatory Visit: Payer: BC Managed Care – PPO | Admitting: Family Medicine

## 2019-03-07 VITALS — BP 110/78 | HR 74 | Ht 63.5 in | Wt 178.0 lb

## 2019-03-07 DIAGNOSIS — G8929 Other chronic pain: Secondary | ICD-10-CM | POA: Diagnosis not present

## 2019-03-07 DIAGNOSIS — M25511 Pain in right shoulder: Secondary | ICD-10-CM | POA: Diagnosis not present

## 2019-03-07 DIAGNOSIS — M2550 Pain in unspecified joint: Secondary | ICD-10-CM

## 2019-03-07 MED ORDER — METHYLPREDNISOLONE ACETATE 80 MG/ML IJ SUSP
80.0000 mg | Freq: Once | INTRAMUSCULAR | Status: AC
  Administered 2019-03-07: 80 mg via INTRAMUSCULAR

## 2019-03-07 MED ORDER — KETOROLAC TROMETHAMINE 60 MG/2ML IM SOLN
60.0000 mg | Freq: Once | INTRAMUSCULAR | Status: AC
  Administered 2019-03-07: 60 mg via INTRAMUSCULAR

## 2019-03-07 MED ORDER — GABAPENTIN 100 MG PO CAPS: 200.0000 mg | ORAL_CAPSULE | Freq: Every day | ORAL | 0 refills | Status: DC

## 2019-03-07 MED FILL — GABAPENTIN 100 MG CAPSULE: 100 | 90 days supply | Qty: 180 | Fill #0

## 2019-03-07 NOTE — Assessment & Plan Note (Signed)
Significant polyarthralgia.  Concern on patient's degenerative disc disease in the lumbar spine is likely contributing.  Patient is on Cymbalta For 4 weeks and may see some improvement.  In addition they started a very low dose of gabapentin at night that I hope will be beneficial.  Discussed icing regimen, home exercise, which activities to do which wants to avoid.  Patient should increase activity slowly encouraged her to start with formal physical therapy that she has been referred to already.  Toradol and Depo-Medrol given today.  Follow-up again in 4 to 6 weeks at that time then will consider other injections.

## 2019-03-07 NOTE — Patient Instructions (Signed)
Pen[place]aid 2x a day as needed 100mg  gabapentin at night Continue Cymbalta Vit d 2000IU daily Tart cherry 1200mg  at night Start PT See me in 3 weeks

## 2019-03-07 NOTE — Addendum Note (Signed)
Addended by: Pollyann Glen on: 03/07/2019 01:55 PM   Modules accepted: Orders

## 2019-03-08 MED FILL — carBAMazepine ER 100 MG TB1: 100 | 30 days supply | Qty: 120 | Fill #3

## 2019-03-08 MED FILL — OMEPRAZOLE 20 MG CAP: 20 | 90 days supply | Qty: 180 | Fill #1

## 2019-03-08 MED FILL — LEVOTHYROXINE 88 MCG TABLET: 88 | 90 days supply | Qty: 90 | Fill #1

## 2019-03-08 MED FILL — TOPIRAMATE 50 MG TABLET: 50 | 30 days supply | Qty: 30 | Fill #3

## 2019-03-09 ENCOUNTER — Ambulatory Visit (INDEPENDENT_AMBULATORY_CARE_PROVIDER_SITE_OTHER): Payer: BC Managed Care – PPO

## 2019-03-09 ENCOUNTER — Other Ambulatory Visit: Payer: Self-pay | Admitting: Family Medicine

## 2019-03-09 ENCOUNTER — Telehealth: Payer: Self-pay

## 2019-03-09 ENCOUNTER — Other Ambulatory Visit: Payer: Self-pay

## 2019-03-09 ENCOUNTER — Ambulatory Visit: Payer: BC Managed Care – PPO | Admitting: Family Medicine

## 2019-03-09 ENCOUNTER — Encounter: Payer: Self-pay | Admitting: Family Medicine

## 2019-03-09 VITALS — BP 130/90 | HR 74 | Ht 63.5 in | Wt 179.0 lb

## 2019-03-09 DIAGNOSIS — M25511 Pain in right shoulder: Secondary | ICD-10-CM | POA: Diagnosis not present

## 2019-03-09 DIAGNOSIS — M7530 Calcific tendinitis of unspecified shoulder: Secondary | ICD-10-CM | POA: Diagnosis not present

## 2019-03-09 DIAGNOSIS — M549 Dorsalgia, unspecified: Secondary | ICD-10-CM

## 2019-03-09 DIAGNOSIS — G8929 Other chronic pain: Secondary | ICD-10-CM

## 2019-03-09 DIAGNOSIS — M1120 Other chondrocalcinosis, unspecified site: Secondary | ICD-10-CM

## 2019-03-09 MED ORDER — COLCHICINE 0.6 MG PO TABS: 0.6000 mg | ORAL_TABLET | Freq: Every day | ORAL | 0 refills | Status: DC

## 2019-03-09 MED ORDER — ALLOPURINOL 100 MG PO TABS: 200.0000 mg | ORAL_TABLET | Freq: Every day | ORAL | 6 refills | Status: DC

## 2019-03-09 MED FILL — ALLOPURINOL 100 MG TABS: 100 | 30 days supply | Qty: 60 | Fill #0

## 2019-03-09 MED FILL — CLOBETASOL 0.05% SOLUTION: 0.05 | 30 days supply | Qty: 50 | Fill #0

## 2019-03-09 MED FILL — COLCHICINE 0.6 MG TABS: 0.6 | 30 days supply | Qty: 30 | Fill #0

## 2019-03-09 NOTE — Progress Notes (Signed)
[place] [address] [place] Phone: [phone] Subjective:   I Kelli Bell am serving as a Education administrator for Dr. Hulan Saas.  This visit occurred during the SARS-CoV-2 public health emergency.  Safety protocols were in place, including screening questions prior to the visit, additional usage of staff PPE, and extensive cleaning of exam room while observing appropriate contact time as indicated for disinfecting solutions.   I'm seeing this patient by the request  of:  Biagio Borg, MD  CC: Right shoulder pain  [id number]   03/07/2019 Significant polyarthralgia.  Concern on patient's degenerative disc disease in the lumbar spine is likely contributing.  Patient is on Cymbalta For 4 weeks and may see some improvement.  In addition they started a very low dose of gabapentin at night that I hope will be beneficial.  Discussed icing regimen, home exercise, which activities to do which wants to avoid.  Patient should increase activity slowly encouraged her to start with formal physical therapy that she has been referred to already.  Toradol and Depo-Medrol given today.  Follow-up again in 4 to 6 weeks at that time then will consider other injections.  Update 03/09/2019 Kelli Bell is a 59 y.o. female coming in with complaint of right shoulder pain. Patient states she shoulder is very painful. Back pain as well but most of her pain is in the right shoulder. After home exercises yesterday she has been in pain around 10 am. Patient states that the shoulder seems to be the most severe at the moment.  Describes the pain as a dull, throbbing aching pain that is waking her up at night.    Past Medical History:  Diagnosis Date  . ANXIETY 03/30/2007  . BACK PAIN 02/20/2009  . Essential hypertension, benign 05/31/2012  . Family history of anesthesia complication    "mother has hard time waking up"  . GERD 01/13/2007  . H/O hiatal hernia   .  HYPERLIPIDEMIA 02/22/2008  . HYPOTHYROIDISM 03/30/2007  . MENOPAUSAL DISORDER 02/23/2008  . Nocturia   . OBSTRUCTIVE SLEEP APNEA 06/29/2007   has not used c pap x 8 months due to wt loss  . Osteoarthritis   . SINUSITIS- ACUTE-NOS 03/30/2007  . THORACIC/LUMBOSACRAL NEURITIS/RADICULITIS UNSPEC 02/23/2008  . Wheezing 01/08/2009   Past Surgical History:  Procedure Laterality Date  . ABDOMINAL HYSTERECTOMY  2004   with BSO  . BREATH TEK H PYLORI N/A 05/05/2013   Procedure: BREATH TEK H PYLORI;  Surgeon: Shann Medal, MD;  Location: [facility];  Service: General;  Laterality: N/A;  . CHOLECYSTECTOMY  1980  . GASTRIC ROUX-EN-Y N/A 09/12/2013   Procedure: LAPAROSCOPIC ROUX-EN-Y GASTRIC BYPASS WITH UPPER ENDOSCOPY WITH ENTEROLYSIS OF ADHESIONS;  Surgeon: Shann Medal, MD;  Location: [facility];  Service: General;  Laterality: N/A;  . jaw bone surgury     59 yo  . TONSILLECTOMY     Social History   Socioeconomic History  . Marital status: Widowed    Spouse name: Not on file  . Number of children: Not on file  . Years of education: Not on file  . Highest education level: Not on file  Occupational History  . Not on file  Tobacco Use  . Smoking status: Former Smoker    Quit date: 07/06/1997    Years since quitting: 21.6  . Smokeless tobacco: Never Used  Substance and Sexual Activity  . Alcohol use: Yes    Comment: [place]  .  Drug use: No  . Sexual activity: Not Currently    Birth control/protection: Surgical    Comment: intercourse age 38, less than 5 sexual partners,des neg  Other Topics Concern  . Not on file  Social History Narrative  . Not on file   Social Determinants of Health   Financial Resource Strain:   . Difficulty of Paying Living Expenses: Not on file  Food Insecurity:   . Worried About Charity fundraiser in the Last Year: Not on file  . Ran Out of Food in the Last Year: Not on file  Transportation Needs:   . Lack of Transportation (Medical): Not  on file  . Lack of Transportation (Non-Medical): Not on file  Physical Activity:   . Days of Exercise per Week: Not on file  . Minutes of Exercise per Session: Not on file  Stress:   . Feeling of Stress : Not on file  Social Connections:   . Frequency of Communication with Friends and Family: Not on file  . Frequency of Social Gatherings with Friends and Family: Not on file  . Attends Religious Services: Not on file  . Active Member of Clubs or Organizations: Not on file  . Attends Archivist Meetings: Not on file  . Marital Status: Not on file   Allergies  Allergen Reactions  . Codeine Nausea Only   Family History  Problem Relation Age of Onset  . Stroke Mother   . Hypertension Mother   . Diabetes Mother   . Cancer Mother        basel cell cancer  . Thyroid disease Mother   . Arthritis Other   . Colon polyps Father 40       "CANCEROUS POLYPS REMOVED THIS YEAR"  . Asthma Brother   . Thyroid disease Maternal Grandmother   . Lymphoma Maternal Grandmother   . Osteoporosis Other   . Breast cancer Maternal Aunt        mat great aunt  . Diabetes Paternal Grandmother   . Heart disease Paternal Grandmother   . Colon cancer Neg Hx     Current Outpatient Medications (Endocrine & Metabolic):  .  levothyroxine (SYNTHROID) 88 MCG tablet, TAKE 1 TABLET BY MOUTH EVERY MORNING.    Current Outpatient Medications (Analgesics):  .  acetaminophen (TYLENOL) 325 MG tablet, Take 650 mg by mouth every 6 (six) hours as needed for mild pain or headache. .  traMADol (ULTRAM) 50 MG tablet, Take 1 tablet (50 mg total) by mouth every 6 (six) hours as needed. Marland Kitchen  allopurinol (ZYLOPRIM) 100 MG tablet, Take 2 tablets (200 mg total) by mouth daily. .  colchicine 0.6 MG tablet, Take 1 tablet (0.6 mg total) by mouth daily.  Current Outpatient Medications (Hematological):  Marland Kitchen  Cyanocobalamin (B-12 PO), Take 5 mLs by mouth as needed (low b12).  Current Outpatient Medications (Other):  Marland Kitchen   CALCIUM PO, Take 5 mLs by mouth daily. .  carBAMazepine (TEGRETOL PO), Take 100 mg by mouth 2 (two) times daily. .  clobetasol (TEMOVATE) 0.05 % external solution,  .  Diclofenac Sodium (PENNSAID) 2 % SOLN, Place 1 application onto the skin 2 (two) times daily. .  DULoxetine (CYMBALTA) 60 MG capsule, Take 1 capsule (60 mg total) by mouth daily. Marland Kitchen  gabapentin (NEURONTIN) 100 MG capsule, Take 2 capsules (200 mg total) by mouth at bedtime. .  Multiple Vitamins-Minerals (MULTIVITAMIN ADULT PO), Take 1 tablet by mouth daily. Marland Kitchen  omeprazole (PRILOSEC) 20 MG capsule, TAKE  2 CAPSULES BY MOUTH DAILY. Marland Kitchen  topiramate (TOPAMAX) 50 MG tablet, Take 1 tablet by mouth daily.   Reviewed prior external information including notes and imaging from  primary care provider As well as notes that were available from care everywhere and other healthcare systems.  Past medical history, social, surgical and family history all reviewed in electronic medical record.  No pertanent information unless stated regarding to the chief complaint.   Review of Systems:  No headache, visual changes, nausea, vomiting, diarrhea, constipation, dizziness, abdominal pain, skin rash, fevers, chills, night sweats, weight loss, swollen lymph nodes, body aches, joint swelling, chest pain, shortness of breath, mood changes. POSITIVE muscle aches  Objective  Blood pressure 130/90, pulse 74, height 5' 3.5" (1.613 m), weight 179 lb (81.2 kg), SpO2 98 %.   General: No apparent distress alert and oriented x3 mood and affect normal, dressed appropriately.  HEENT: Pupils equal, extraocular movements intact  Respiratory: Patient's speak in full sentences and does not appear short of breath  Cardiovascular: No lower extremity edema, non tender, no erythema  Skin: Warm dry intact with no signs of infection or rash on extremities or on axial skeleton.  Abdomen: Soft nontender  Neuro: Cranial nerves II through XII are intact, neurovascularly intact  in all extremities with 2+ DTRs and 2+ pulses.  Lymph: No lymphadenopathy of posterior or anterior cervical chain or axillae bilaterally.  Gait normal with good balance and coordination.  MSK:  Non tender with full range of motion and good stability and symmetric strength and tone of , elbows, wrist, hip, knee and ankles bilaterally.  Shoulder: Right Inspection reveals no abnormalities, atrophy or asymmetry. Palpation is normal with no tenderness over AC joint or bicipital groove. ROM is full in all planes passively. Rotator cuff strength normal throughout. signs of impingement with positive Neer and Hawkin's tests, but negative empty can sign. Speeds and Yergason's tests normal. No labral pathology noted with negative Obrien's, negative clunk and good stability. Normal scapular function observed. No painful arc and no drop arm sign. No apprehension sign  MSK US performed of: Right This study was ordered, performed, and interpreted by Charlann Boxer D.O.  Shoulder:   Supraspinatus:  Appears normal on long and transverse views, Bursal bulge seen with shoulder abduction on impingement view.  Patient is tender and has what appears to be pseudogout deposits with calcific changes Infraspinatus:  Appears normal on long and transverse views. Significant increase in Doppler flow Subscapularis:  Appears normal on long and transverse views. Positive bursa Teres Minor:  Appears normal on long and transverse views. AC joint:  Capsule undistended, no geyser sign. Glenohumeral Joint:  Appears normal without effusion. Glenoid Labrum:  Intact without visualized tears. Biceps Tendon:  Appears normal on long and transverse views, no fraying of tendon, tendon located in intertubercular groove, no subluxation with shoulder internal or external rotation.  Impression: Subacromial bursitis, calcific with possible pseudogout deposits  Procedure: Real-time Ultrasound Guided Injection of right glenohumeral  joint Device: GE Logiq E  Ultrasound guided injection is preferred based studies that show increased duration, increased effect, greater accuracy, decreased procedural pain, increased response rate with ultrasound guided versus blind injection.  Verbal informed consent obtained.  Time-out conducted.  Noted no overlying erythema, induration, or other signs of local infection.  Skin prepped in a sterile fashion.  Local anesthesia: Topical Ethyl chloride.  With sterile technique and under real time ultrasound guidance:  Joint visualized.  23g 1  inch needle inserted posterior approach. Pictures  taken for needle placement. Patient did have injection of 2 cc of 1% lidocaine, 2 cc of 0.5% Marcaine, and 1.0 cc of Kenalog 40 mg/dL. Completed without difficulty  Pain immediately resolved suggesting accurate placement of the medication.  Advised to call if fevers/chills, erythema, induration, drainage, or persistent bleeding.  Images permanently stored and available for review in the ultrasound unit.  Impression: Technically successful ultrasound guided injection.   Impression and Recommendations:     This case required medical decision making of moderate complexity. The above documentation has been reviewed and is accurate and complete Lyndal Pulley, DO       Note: This dictation was prepared with Dragon dictation along with smaller phrase technology. Any transcriptional errors that result from this process are unintentional.

## 2019-03-09 NOTE — Telephone Encounter (Addendum)
Patient daughter came back in and was wanting to clarify the medications patient should be taking. I informed at last Tuesday's appointment patient was sent in Gabapentin and told to start vitamin D 2000 units and [place] and at todays appointment was sent in allopurinol and colchicine.  Patients daughter was thinking there was something else that was sent in at the last appointment, and would like a call back.  Daughter Hubert Azure [phone] on [facility]

## 2019-03-09 NOTE — Patient Instructions (Addendum)
Good to see you Xray today

## 2019-03-09 NOTE — Telephone Encounter (Signed)
Spoke with patient and sister about medications that were called in for patient.

## 2019-03-09 NOTE — Assessment & Plan Note (Signed)
Patient though did have signs and symptoms that is consistent with the possibility of pseudogout.  We discussed with patient about the bursitis.  Discussed which activities to do which wants to avoid.  Discussed icing regimen and home exercises, patient is to increase activity as tolerated.  Follow-up again in 4 to 8 weeks

## 2019-03-20 ENCOUNTER — Other Ambulatory Visit: Payer: Self-pay

## 2019-03-20 ENCOUNTER — Ambulatory Visit: Payer: BC Managed Care – PPO | Attending: Internal Medicine

## 2019-03-20 DIAGNOSIS — M25561 Pain in right knee: Secondary | ICD-10-CM | POA: Diagnosis not present

## 2019-03-20 DIAGNOSIS — R2689 Other abnormalities of gait and mobility: Secondary | ICD-10-CM

## 2019-03-20 DIAGNOSIS — M5441 Lumbago with sciatica, right side: Secondary | ICD-10-CM | POA: Insufficient documentation

## 2019-03-20 DIAGNOSIS — M25511 Pain in right shoulder: Secondary | ICD-10-CM | POA: Diagnosis not present

## 2019-03-20 DIAGNOSIS — M6281 Muscle weakness (generalized): Secondary | ICD-10-CM | POA: Diagnosis not present

## 2019-03-20 DIAGNOSIS — M25551 Pain in right hip: Secondary | ICD-10-CM | POA: Diagnosis not present

## 2019-03-20 DIAGNOSIS — G8929 Other chronic pain: Secondary | ICD-10-CM | POA: Diagnosis not present

## 2019-03-20 NOTE — Therapy (Signed)
[facility] [address] [place], [place], [address] Phone: [phone]   Fax:  [phone]  Physical Therapy Evaluation  Patient Details  Name: Kelli Bell MRN: [id number] Date of Birth: [birth year] Referring Provider (PT): Cathlean Cower, MD   Encounter Date: 03/20/2019  PT End of Session - 03/20/19 1529    Visit Number  1    Date for PT Re-Evaluation  05/15/19    PT Start Time  [id number]   no treatment- evaluation only. Multiple body parts   PT Stop Time  1530    PT Time Calculation (min)  39 min    Activity Tolerance  Patient tolerated treatment well    Behavior During Therapy  WFL for tasks assessed/performed       Past Medical History:  Diagnosis Date  . ANXIETY 03/30/2007  . BACK PAIN 02/20/2009  . Essential hypertension, benign 05/31/2012  . Family history of anesthesia complication    "mother has hard time waking up"  . GERD 01/13/2007  . H/O hiatal hernia   . HYPERLIPIDEMIA 02/22/2008  . HYPOTHYROIDISM 03/30/2007  . MENOPAUSAL DISORDER 02/23/2008  . Nocturia   . OBSTRUCTIVE SLEEP APNEA 06/29/2007   has not used c pap x 8 months due to wt loss  . Osteoarthritis   . SINUSITIS- ACUTE-NOS 03/30/2007  . THORACIC/LUMBOSACRAL NEURITIS/RADICULITIS UNSPEC 02/23/2008  . Wheezing 01/08/2009    Past Surgical History:  Procedure Laterality Date  . ABDOMINAL HYSTERECTOMY  2004   with BSO  . BREATH TEK H PYLORI N/A 05/05/2013   Procedure: BREATH TEK H PYLORI;  Surgeon: Shann Medal, MD;  Location: [facility];  Service: General;  Laterality: N/A;  . CHOLECYSTECTOMY  1980  . GASTRIC ROUX-EN-Y N/A 09/12/2013   Procedure: LAPAROSCOPIC ROUX-EN-Y GASTRIC BYPASS WITH UPPER ENDOSCOPY WITH ENTEROLYSIS OF ADHESIONS;  Surgeon: Shann Medal, MD;  Location: [facility];  Service: General;  Laterality: N/A;  . jaw bone surgury     59 yo  . TONSILLECTOMY      There were no vitals filed for this visit.   Subjective Assessment -  03/20/19 1458    Subjective  Pt presents to PT with chronic history of LBP and Rt LE pain and weakness.  Pt also reports Rt shoulder pain that began in July 2020.  Pt reports that Rt LE weakness has progressed and she no longer feels safe driving.  Pt ambulates with a cane for stability.  Pt has had a workup at a rheumatologist and was treated for lupus and this was not diagnosed.  Medication has not helped.    Pertinent History  sleep apnea, pseudo gout.  Gastric bypass (2015)- 120 lbs lost.  Recently gained 40 lbs due to immobility associated with weakness    Limitations  Standing;Walking    How long can you stand comfortably?  10 minutes    How long can you walk comfortably?  3-5 minutes    Diagnostic tests  [place]: Rt shoulder bursitis    Patient Stated Goals  return to driving, walk independently without device, care for grandchildren    Currently in Pain?  Yes    Pain Score  7     Pain Location  Back    Pain Orientation  Right;Lower    Pain Descriptors / Indicators  Aching    Pain Type  Chronic pain    Pain Radiating Towards  Rt LE- hip and knee    Pain Onset  More than a month ago  Pain Frequency  Constant    Aggravating Factors   standing and walking, sleep in bed (sleeping in recliner)    Pain Relieving Factors  medication, rest    Effect of Pain on Daily Activities  not able to drive, reach overhead, walk    Multiple Pain Sites  Yes    Pain Score  5    Pain Location  Shoulder    Pain Orientation  Right    Pain Descriptors / Indicators  Aching;Sore    Pain Type  Chronic pain    Pain Onset  More than a month ago    Pain Frequency  Constant    Aggravating Factors   always hurts, use of Rt UE, reaching overhead    Pain Relieving Factors  heat, rest         [facility] PT Assessment - 03/20/19 0001      Assessment   Medical Diagnosis  low back pain, Rt shoulder pain, Rt hip pain, Rt knee pain    Referring Provider (PT)  Cathlean Cower, MD    Onset Date/Surgical Date  --   chronic  lumbar and Rt LE, Rt shoulder 7/20     Precautions   Precautions  Other (comment);Fall   possible autoimmune disorder- per pt      Restrictions   Weight Bearing Restrictions  No      Home Environment   Living Environment  Private residence    Living Arrangements  Other relatives    Type of [place] Access  Level entry    [place]  One level    Additional Comments  staying with parents right now due to difficulty with stepts at her house      Prior Function   Level of Independence  Independent with household mobility with device;Requires assistive device for independence    Vocation  Retired    Leisure  walking, care of grandchildren      Cognition   Overall Cognitive Status  Within Functional Limits for tasks assessed      Observation/Other Assessments   Focus on Therapeutic Outcomes (FOTO)   71% limitation      Posture/Postural Control   Posture/Postural Control  Postural limitations    Postural Limitations  Forward head;Weight shift left      ROM / Strength   AROM / PROM / Strength  AROM;Strength      AROM   Overall AROM   Deficits    Overall AROM Comments  Rt shoulder A/ROM limited by >50% in all directions due to pain.  LE A/ROM and lumbar not tested due to time constraints and limited standing balance      Strength   Overall Strength  Unable to assess;Due to pain    Overall Strength Comments  Rt UE not able to assess due to pain.  Lt UE 4-/5, in sitting- Lt hip flexion and knee flexion/extension 4/5.  Rt LE 3+/5 with resisted testing       Palpation   Palpation comment  diffuse palpable tenderness over bil lumbar spine and gluteals and Rt shoulder joint and musculature      Transfers   Transfers  Stand to Sit;Sit to Stand    Sit to Stand  6: Modified independent (Device/Increase time)    Comments  slow movement with sit to stand.  Will test 5x sit to stand next session      Ambulation/Gait   Ambulation/Gait  Yes    Ambulation/Gait Assistance  6:  Modified independent (Device/Increase time)    Assistive device  Straight cane    Gait Pattern  Step-through pattern;Decreased stride length;Decreased step length - right;Decreased stance time - right                Objective measurements completed on examination: See above findings.                PT Short Term Goals - 03/20/19 1454      PT SHORT TERM GOAL #1   Title  be independent in initial HEP    Time  4    Period  Weeks    Status  New    Target Date  04/17/19      PT SHORT TERM GOAL #2   Title  improve LE strength to walk for 8-10 minutes to improve independence in the community    Time  4    Period  Weeks    Status  New    Target Date  04/17/19      PT SHORT TERM GOAL #3   Title  being to use Rt UE for ADLs and self-care and report > or = to 25% use of the Rt UE    Time  4    Period  Weeks    Status  New    Target Date  04/17/19      PT SHORT TERM GOAL #4   Title  perform 5x sit to stand in < or = to 20 seconds to reduce falls risk    Time  4    Period  Weeks    Status  New    Target Date  04/17/19        PT Long Term Goals - 03/20/19 1534      PT LONG TERM GOAL #1   Title  be independent in advanced HEP    Time  8    Period  Weeks    Status  New    Target Date  05/15/19      PT LONG TERM GOAL #2   Title  reduce FOTO to < or = to 49% limitation    Time  8    Period  Weeks    Status  New    Target Date  05/15/19      PT LONG TERM GOAL #3   Title  reduce Rt UE pain to improve use of Rt UE with self-care and ADLs to > or = to 75%    Time  8    Period  Weeks    Status  New    Target Date  05/15/19      PT LONG TERM GOAL #4   Title  improve endurance to walk for 10-15 minutes in the community to improve independence    Time  8    Period  Weeks    Status  New    Target Date  05/15/19      PT LONG TERM GOAL #5   Title  demonstrate > or = to 4/5 bil LE strength to improve safety at home and in the community    Time  8     Period  Weeks    Status  New    Target Date  05/15/19      Additional Long Term Goals   Additional Long Term Goals  Yes      PT LONG TERM GOAL #6   Title  perform 5x sit to stand  in < or = to 15 seconds to improve balance    Time  8    Period  Weeks    Status  New    Target Date  05/15/19             Plan - 03/20/19 1704    Clinical Impression Statement  Pt presents to PT with a complex medical history.  Pt has a chronic history of LBP and began to have Rt LE pain and Rt shoulder pain ~6 months ago.  Pt has had multiple medical test and does not have a clear diagnosis. Pt reports that it is possibly something autoimmune. Pt is currently staying with her parents due to the challenge of steps at her home and has stopped driving due to Rt LE instability. Pt is not using her Rt UE due to pain and is not able to strength testing due to this.  Pt demonstrates 50% A/ROM of the Rt shoulder with pain reported.  Pt with slow mobility with transitional movements and gait and uses cane for all distances.  Pt with 7/10 LBP and Rt LE pain with standing and walking and is limited to 3-5 minutes of walking and 10 minutes of standing.  Pt will benefit from skilled PT for gradual return to mobility and use of the Rt UE, gait and strength and functional endurance training to allow for improved independence.    Personal Factors and Comorbidities  Comorbidity 2    Comorbidities  Rt shoulder bursitis, chronic lumbar pain, recent onset of undiagnosed symptoms    Examination-Activity Limitations  Bathing;Dressing;Hygiene/Grooming;Sleep;Locomotion Level;Transfers    Examination-Participation Restrictions  Community Activity;Driving;Meal Prep;Shop    Stability/Clinical Decision Making  Unstable/Unpredictable    Clinical Decision Making  High    Rehab Potential  Good    PT Frequency  2x / week    PT Duration  8 weeks    PT Treatment/Interventions  ADLs/Self Care Home Management;Cryotherapy;Electrical  Stimulation;Ultrasound;Moist Heat;Gait training;Stair training;Functional mobility training;Therapeutic activities;Therapeutic exercise;Neuromuscular re-education;Manual techniques;Patient/family education;Passive range of motion;Taping    PT Next Visit Plan  initiate HEP- gentle Rt UE ROM or isometrics, seated LE strength, NuStep    Consulted and Agree with Plan of Care  Patient       Patient will benefit from skilled therapeutic intervention in order to improve the following deficits and impairments:  Abnormal gait, Decreased activity tolerance, Decreased balance, Decreased mobility, Decreased strength, Postural dysfunction, Improper body mechanics, Impaired flexibility, Pain, Decreased endurance, Increased muscle spasms, Difficulty walking  Visit Diagnosis: Chronic bilateral low back pain with right-sided sciatica - Plan: PT plan of care cert/re-cert  Pain in right hip - Plan: PT plan of care cert/re-cert  Chronic pain of right knee - Plan: PT plan of care cert/re-cert  Chronic right shoulder pain - Plan: PT plan of care cert/re-cert  Muscle weakness (generalized) - Plan: PT plan of care cert/re-cert  Other abnormalities of gait and mobility - Plan: PT plan of care cert/re-cert     Problem List Patient Active Problem List   Diagnosis Date Noted  . Calcific bursitis of shoulder 03/09/2019  . Pseudogout 03/09/2019  . Polyarthralgia 10/04/2018  . Alopecia areata 10/04/2018  . 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 Viral illness 12/24/2017  . Myalgia 12/21/2017  . Dizziness 08/22/2016  . Grief reaction 10/31/2015  . Trigeminal neuralgia of left side of face 05/10/2014  . Acute sinus infection 12/27/2013  . Allergic rhinitis 12/27/2013  . Nausea 10/02/2013  . Preop exam for internal medicine 09/05/2013  .  Morbid obesity, weight - 206, BMI - 36.6 04/12/2013  . Postmenopausal hormone replacement therapy 05/22/2011  . Encounter for well adult exam with abnormal findings 07/21/2010  .  BACK PAIN 02/20/2009  . THORACIC/LUMBOSACRAL NEURITIS/RADICULITIS UNSPEC 02/23/2008  . Hyperlipidemia 02/22/2008  . VERTIGO 11/23/2007  . OBSTRUCTIVE SLEEP APNEA 06/29/2007  . FATIGUE 06/29/2007  . Hypothyroidism 03/30/2007  . Anxiety state 03/30/2007  . Depression 03/30/2007  . GERD 01/13/2007   Sigurd Sos, PT 03/20/19 5:10 PM  [facility] [address], [place], [place], [address] Phone: [phone]   Fax:  [phone]  Name: Kelli Bell MRN: [id number] Date of Birth: 1960-06-27

## 2019-03-22 DIAGNOSIS — Z79899 Other long term (current) drug therapy: Secondary | ICD-10-CM | POA: Diagnosis not present

## 2019-03-22 DIAGNOSIS — M25511 Pain in right shoulder: Secondary | ICD-10-CM | POA: Diagnosis not present

## 2019-03-22 DIAGNOSIS — G894 Chronic pain syndrome: Secondary | ICD-10-CM | POA: Diagnosis not present

## 2019-03-22 DIAGNOSIS — Z79891 Long term (current) use of opiate analgesic: Secondary | ICD-10-CM | POA: Diagnosis not present

## 2019-03-22 DIAGNOSIS — M25561 Pain in right knee: Secondary | ICD-10-CM | POA: Diagnosis not present

## 2019-03-22 DIAGNOSIS — M545 Low back pain: Secondary | ICD-10-CM | POA: Diagnosis not present

## 2019-03-28 DIAGNOSIS — L639 Alopecia areata, unspecified: Secondary | ICD-10-CM | POA: Diagnosis not present

## 2019-03-29 ENCOUNTER — Other Ambulatory Visit: Payer: Self-pay

## 2019-03-29 ENCOUNTER — Encounter: Payer: Self-pay | Admitting: Physical Therapy

## 2019-03-29 ENCOUNTER — Ambulatory Visit: Payer: BC Managed Care – PPO | Attending: Internal Medicine | Admitting: Physical Therapy

## 2019-03-29 DIAGNOSIS — G8929 Other chronic pain: Secondary | ICD-10-CM | POA: Insufficient documentation

## 2019-03-29 DIAGNOSIS — M25551 Pain in right hip: Secondary | ICD-10-CM

## 2019-03-29 DIAGNOSIS — M25561 Pain in right knee: Secondary | ICD-10-CM | POA: Diagnosis not present

## 2019-03-29 DIAGNOSIS — M5441 Lumbago with sciatica, right side: Secondary | ICD-10-CM | POA: Diagnosis not present

## 2019-03-29 DIAGNOSIS — M6281 Muscle weakness (generalized): Secondary | ICD-10-CM | POA: Insufficient documentation

## 2019-03-29 DIAGNOSIS — M4150 Other secondary scoliosis, site unspecified: Secondary | ICD-10-CM | POA: Insufficient documentation

## 2019-03-29 DIAGNOSIS — M25511 Pain in right shoulder: Secondary | ICD-10-CM | POA: Insufficient documentation

## 2019-03-29 DIAGNOSIS — M4180 Other forms of scoliosis, site unspecified: Secondary | ICD-10-CM | POA: Diagnosis not present

## 2019-03-29 DIAGNOSIS — R531 Weakness: Secondary | ICD-10-CM | POA: Insufficient documentation

## 2019-03-29 DIAGNOSIS — R2689 Other abnormalities of gait and mobility: Secondary | ICD-10-CM | POA: Diagnosis not present

## 2019-03-29 DIAGNOSIS — M47812 Spondylosis without myelopathy or radiculopathy, cervical region: Secondary | ICD-10-CM | POA: Diagnosis not present

## 2019-03-29 NOTE — Therapy (Signed)
[facility] [address], [place], [place], [address] Phone: [phone]   Fax:  [phone]  Physical Therapy Treatment  Patient Details  Name: Kelli Bell MRN: [id number] Date of Birth: [birth year] Referring Provider (PT): Cathlean Cower, MD   Encounter Date: 03/29/2019  PT End of Session - 03/29/19 1017    Visit Number  2    Date for PT Re-Evaluation  05/15/19    PT Start Time  1017    PT Stop Time  1059    PT Time Calculation (min)  42 min    Activity Tolerance  Patient tolerated treatment well;Patient limited by pain    Behavior During Therapy  [facility] for tasks assessed/performed       Past Medical History:  Diagnosis Date  . ANXIETY 03/30/2007  . BACK PAIN 02/20/2009  . Essential hypertension, benign 05/31/2012  . Family history of anesthesia complication    "mother has hard time waking up"  . GERD 01/13/2007  . H/O hiatal hernia   . HYPERLIPIDEMIA 02/22/2008  . HYPOTHYROIDISM 03/30/2007  . MENOPAUSAL DISORDER 02/23/2008  . Nocturia   . OBSTRUCTIVE SLEEP APNEA 06/29/2007   has not used c pap x 8 months due to wt loss  . Osteoarthritis   . SINUSITIS- ACUTE-NOS 03/30/2007  . THORACIC/LUMBOSACRAL NEURITIS/RADICULITIS UNSPEC 02/23/2008  . Wheezing 01/08/2009    Past Surgical History:  Procedure Laterality Date  . ABDOMINAL HYSTERECTOMY  2004   with BSO  . BREATH TEK H PYLORI N/A 05/05/2013   Procedure: BREATH TEK H PYLORI;  Surgeon: Shann Medal, MD;  Location: [facility];  Service: General;  Laterality: N/A;  . CHOLECYSTECTOMY  1980  . GASTRIC ROUX-EN-Y N/A 09/12/2013   Procedure: LAPAROSCOPIC ROUX-EN-Y GASTRIC BYPASS WITH UPPER ENDOSCOPY WITH ENTEROLYSIS OF ADHESIONS;  Surgeon: Shann Medal, MD;  Location: [facility];  Service: General;  Laterality: N/A;  . jaw bone surgury     59 yo  . TONSILLECTOMY      There were no vitals filed for this visit.  Subjective Assessment - 03/29/19 1018    Subjective  Feeling  pretty good. Patient reports no pain today in the back. "I think the Cymbalta is finally kicking in".    Pertinent History  sleep apnea, pseudo gout.  Gastric bypass (2015)- 120 lbs lost.  Recently gained 40 lbs due to immobility associated with weakness    Patient Stated Goals  return to driving, walk independently without device, care for grandchildren    Currently in Pain?  Yes    Pain Score  0-No pain    Pain Location  Back    Pain Score  4    Pain Location  Shoulder    Pain Orientation  Right         [facility] PT Assessment - 03/29/19 0001      Standardized Balance Assessment   Standardized Balance Assessment  Five Times Sit to Stand    Five times sit to stand comments   29 seconds                   [facility] Adult PT Treatment/Exercise - 03/29/19 0001      Ambulation/Gait   Ambulation/Gait  Yes    Ambulation/Gait Assistance  6: Modified independent (Device/Increase time)    Assistive device  Straight cane    Ambulation Surface  Level    Gait Comments  gait in correct UE, worked on increased step length with left; used Geologist, engineering for  feedback      Exercises   Exercises  Knee/Hip;Shoulder      Knee/Hip Exercises: Aerobic   Nustep  L1 x 5 min at 65 SPM      Knee/Hip Exercises: Supine   Bridges  10 reps    Straight Leg Raises  Right;10 reps    Straight Leg Raises Limitations  cues to exhale on lift      Knee/Hip Exercises: Sidelying   Hip ABduction  Right;10 reps    Hip ABduction Limitations  cues to exhale on lift      Shoulder Exercises: ROM/Strengthening   Other ROM/Strengthening Exercises  Wall slide flex/Scap/abd x 5 ea   painful     Shoulder Exercises: Isometric Strengthening   Flexion  5X10"    Flexion Limitations  arm at 90 deg    Extension  5X10"    External Rotation  5X10"    Internal Rotation  5X10"    ABduction  5X10"    ABduction Limitations  straight arm             PT Education - 03/29/19 1526    Education Details  HEP initiated; gait  instructions       PT Short Term Goals - 03/20/19 1454      PT SHORT TERM GOAL #1   Title  be independent in initial HEP    Time  4    Period  Weeks    Status  New    Target Date  04/17/19      PT SHORT TERM GOAL #2   Title  improve LE strength to walk for 8-10 minutes to improve independence in the community    Time  4    Period  Weeks    Status  New    Target Date  04/17/19      PT SHORT TERM GOAL #3   Title  being to use Rt UE for ADLs and self-care and report > or = to 25% use of the Rt UE    Time  4    Period  Weeks    Status  New    Target Date  04/17/19      PT SHORT TERM GOAL #4   Title  perform 5x sit to stand in < or = to 20 seconds to reduce falls risk    Time  4    Period  Weeks    Status  New    Target Date  04/17/19        PT Long Term Goals - 03/20/19 1534      PT LONG TERM GOAL #1   Title  be independent in advanced HEP    Time  8    Period  Weeks    Status  New    Target Date  05/15/19      PT LONG TERM GOAL #2   Title  reduce FOTO to < or = to 49% limitation    Time  8    Period  Weeks    Status  New    Target Date  05/15/19      PT LONG TERM GOAL #3   Title  reduce Rt UE pain to improve use of Rt UE with self-care and ADLs to > or = to 75%    Time  8    Period  Weeks    Status  New    Target Date  05/15/19      PT LONG TERM  GOAL #4   Title  improve endurance to walk for 10-15 minutes in the community to improve independence    Time  8    Period  Weeks    Status  New    Target Date  05/15/19      PT LONG TERM GOAL #5   Title  demonstrate > or = to 4/5 bil LE strength to improve safety at home and in the community    Time  8    Period  Weeks    Status  New    Target Date  05/15/19      Additional Long Term Goals   Additional Long Term Goals  Yes      PT LONG TERM GOAL #6   Title  perform 5x sit to stand in < or = to 15 seconds to improve balance    Time  8    Period  Weeks    Status  New    Target Date  05/15/19             Plan - 03/29/19 1530    Clinical Impression Statement  Patient presents today with less pain which she attributes to her meds "kicking in". PT adjusted her cane height and then we worked on using the cane in her LUE. She was able to increase her left step length with verbal and visual cues using the mirror. Patient able to amb with much improved gait pattern at end of treatment. She tolerated Nustep and LE strengthening well. 5x sit to stand was 29 seconds. Shoulder was limited with ROM due to pain. PT advised pt to take shoulder into full range for at least a couple of reps daily to avoid frozen shoulder. Isometrics were tolerable and issued for HEP.    Comorbidities  Rt shoulder bursitis, chronic lumbar pain, recent onset of undiagnosed symptoms    PT Treatment/Interventions  ADLs/Self Care Home Management;Cryotherapy;Electrical Stimulation;Ultrasound;Moist Heat;Gait training;Stair training;Functional mobility training;Therapeutic activities;Therapeutic exercise;Neuromuscular re-education;Manual techniques;Patient/family education;Passive range of motion;Taping    PT Next Visit Plan  continue to address gait as needed,  gentle Rt UE ROM or isometrics, seated LE strength, NuStep    PT Home Exercise Plan  [email or web address]    Consulted and Agree with Plan of Care  Patient       Patient will benefit from skilled therapeutic intervention in order to improve the following deficits and impairments:  Abnormal gait, Decreased activity tolerance, Decreased balance, Decreased mobility, Decreased strength, Postural dysfunction, Improper body mechanics, Impaired flexibility, Pain, Decreased endurance, Increased muscle spasms, Difficulty walking  Visit Diagnosis: Chronic bilateral low back pain with right-sided sciatica  Pain in right hip  Chronic pain of right knee  Chronic right shoulder pain  Muscle weakness (generalized)  Other abnormalities of gait and mobility     Problem List Patient  Active Problem List   Diagnosis Date Noted  . Calcific bursitis of shoulder 03/09/2019  . Pseudogout 03/09/2019  . Polyarthralgia 10/04/2018  . Alopecia areata 10/04/2018  . 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 Viral illness 12/24/2017  . Myalgia 12/21/2017  . Dizziness 08/22/2016  . Grief reaction 10/31/2015  . Trigeminal neuralgia of left side of face 05/10/2014  . Acute sinus infection 12/27/2013  . Allergic rhinitis 12/27/2013  . Nausea 10/02/2013  . Preop exam for internal medicine 09/05/2013  . Morbid obesity, weight - 206, BMI - 36.6 04/12/2013  . Postmenopausal hormone replacement therapy 05/22/2011  . Encounter for well adult exam with abnormal findings 07/21/2010  .  BACK PAIN 02/20/2009  . THORACIC/LUMBOSACRAL NEURITIS/RADICULITIS UNSPEC 02/23/2008  . Hyperlipidemia 02/22/2008  . VERTIGO 11/23/2007  . OBSTRUCTIVE SLEEP APNEA 06/29/2007  . FATIGUE 06/29/2007  . Hypothyroidism 03/30/2007  . Anxiety state 03/30/2007  . Depression 03/30/2007  . GERD 01/13/2007    Madelyn Flavors PT 03/29/2019, 3:38 PM  [facility] [address] [place], [place], [address] Phone: [phone]   Fax:  [phone]  Name: Kelli Bell MRN: [id number] Date of Birth: July 21, 1960

## 2019-03-29 NOTE — Patient Instructions (Signed)
Access Code: [email or web address]  URL: [email or web address]  Date: 03/29/2019  Prepared by: Almyra Free Kesia Dalto   Exercises Isometric Shoulder Flexion at [place] - 5 reps - 1 sets - 10 sec hold - 1x daily - 7x weekly Isometric Shoulder Abduction - Arm Straight at Wall - 5 reps - 1 sets - 10 sec hold - 1x daily - 7x weekly Isometric Shoulder Extension at Wall - 5 reps - 1 sets - 10 sec hold - 1x daily - 7x weekly Standing Isometric Shoulder Internal Rotation at Doorway - 5 reps - 1 sets - 10 sec hold - 1x daily - 7x weekly Standing Isometric Shoulder External Rotation with Doorway - 5 reps - 1 sets - 10 sec hold - 1x daily - 7x weekly Standing Shoulder Abduction Slides at Wall - 3-5 reps - 1 sets - 1x daily - 7x weekly Shoulder Flexion Wall Slide with Towel - 3-5 reps - 1 sets - 1x daily - 7x weekly Shoulder Scaption Wall Slide with Towel - 3-5 reps - 1 sets - 1x daily - 7x weekly Patient Education Reciprocal Gait with [employer]

## 2019-03-30 ENCOUNTER — Ambulatory Visit: Payer: BC Managed Care – PPO | Admitting: Physical Therapy

## 2019-03-30 ENCOUNTER — Ambulatory Visit (INDEPENDENT_AMBULATORY_CARE_PROVIDER_SITE_OTHER): Payer: BC Managed Care – PPO

## 2019-03-30 ENCOUNTER — Encounter: Payer: Self-pay | Admitting: Family Medicine

## 2019-03-30 ENCOUNTER — Other Ambulatory Visit: Payer: Self-pay

## 2019-03-30 ENCOUNTER — Encounter: Payer: Self-pay | Admitting: Physical Therapy

## 2019-03-30 ENCOUNTER — Ambulatory Visit: Payer: BC Managed Care – PPO | Admitting: Family Medicine

## 2019-03-30 VITALS — BP 124/78 | HR 63 | Ht 63.5 in | Wt 178.0 lb

## 2019-03-30 DIAGNOSIS — M7530 Calcific tendinitis of unspecified shoulder: Secondary | ICD-10-CM | POA: Diagnosis not present

## 2019-03-30 DIAGNOSIS — M25551 Pain in right hip: Secondary | ICD-10-CM | POA: Diagnosis not present

## 2019-03-30 DIAGNOSIS — R2689 Other abnormalities of gait and mobility: Secondary | ICD-10-CM

## 2019-03-30 DIAGNOSIS — M25511 Pain in right shoulder: Secondary | ICD-10-CM

## 2019-03-30 DIAGNOSIS — G8929 Other chronic pain: Secondary | ICD-10-CM | POA: Diagnosis not present

## 2019-03-30 DIAGNOSIS — M25561 Pain in right knee: Secondary | ICD-10-CM | POA: Diagnosis not present

## 2019-03-30 DIAGNOSIS — M5441 Lumbago with sciatica, right side: Secondary | ICD-10-CM | POA: Diagnosis not present

## 2019-03-30 DIAGNOSIS — M6281 Muscle weakness (generalized): Secondary | ICD-10-CM

## 2019-03-30 MED ORDER — KETOROLAC TROMETHAMINE 60 MG/2ML IM SOLN
60.0000 mg | Freq: Once | INTRAMUSCULAR | Status: AC
  Administered 2019-03-30: 60 mg via INTRAMUSCULAR

## 2019-03-30 NOTE — Assessment & Plan Note (Signed)
Patient had injections previously.  No significant improvement.  Concerned that the calcific changes that were noted previously could trying to heal a rotator cuff tear and I do feel advanced imaging is warranted at this time.  Has done formal physical therapy with minimal improvement at the moment.  If anything worsening.

## 2019-03-30 NOTE — Progress Notes (Signed)
Kelli Bell [address] [place] Phone: [phone] Subjective:   I Kandace Blitz am serving as a Education administrator for Dr. Hulan Saas.  This visit occurred during the SARS-CoV-2 public health emergency.  Safety protocols were in place, including screening questions prior to the visit, additional usage of staff PPE, and extensive cleaning of exam room while observing appropriate contact time as indicated for disinfecting solutions.   I'm seeing this patient by the request  of:  Biagio Borg, MD  CC: Right shoulder pain all over pain  [id number]   03/09/2019 Patient though did have signs and symptoms that is consistent with the possibility of pseudogout.  We discussed with patient about the bursitis.  Discussed which activities to do which wants to avoid.  Discussed icing regimen and home exercises, patient is to increase activity as tolerated.  Follow-up again in 4 to 8 weeks  03/30/2019 Kelli Bell is a 59 y.o. female coming in with complaint of right shoulder and right hip pain. Patient states she had PT today for the knee, hip and shoulder. Had xray on the neck and back yesterday. Patient states she is in a lot of pain. Patient states she believes she needs an MRI. Loss of ROM.  Patient feels if anything the shoulder is getting worse and weaker.  Affecting daily activities such as sleep as well as dressing.        Past Medical History:  Diagnosis Date  . ANXIETY 03/30/2007  . BACK PAIN 02/20/2009  . Essential hypertension, benign 05/31/2012  . Family history of anesthesia complication    "mother has hard time waking up"  . GERD 01/13/2007  . H/O hiatal hernia   . HYPERLIPIDEMIA 02/22/2008  . HYPOTHYROIDISM 03/30/2007  . MENOPAUSAL DISORDER 02/23/2008  . Nocturia   . OBSTRUCTIVE SLEEP APNEA 06/29/2007   has not used c pap x 8 months due to wt loss  . Osteoarthritis   . SINUSITIS- ACUTE-NOS 03/30/2007  . THORACIC/LUMBOSACRAL NEURITIS/RADICULITIS  UNSPEC 02/23/2008  . Wheezing 01/08/2009   Past Surgical History:  Procedure Laterality Date  . ABDOMINAL HYSTERECTOMY  2004   with BSO  . BREATH TEK H PYLORI N/A 05/05/2013   Procedure: BREATH TEK H PYLORI;  Surgeon: Shann Medal, MD;  Location: [facility];  Service: General;  Laterality: N/A;  . CHOLECYSTECTOMY  1980  . GASTRIC ROUX-EN-Y N/A 09/12/2013   Procedure: LAPAROSCOPIC ROUX-EN-Y GASTRIC BYPASS WITH UPPER ENDOSCOPY WITH ENTEROLYSIS OF ADHESIONS;  Surgeon: Shann Medal, MD;  Location: [facility];  Service: General;  Laterality: N/A;  . jaw bone surgury     59 yo  . TONSILLECTOMY     Social History   Socioeconomic History  . Marital status: Widowed    Spouse name: Not on file  . Number of children: Not on file  . Years of education: Not on file  . Highest education level: Not on file  Occupational History  . Not on file  Tobacco Use  . Smoking status: Former Smoker    Quit date: 07/06/1997    Years since quitting: 21.7  . Smokeless tobacco: Never Used  Substance and Sexual Activity  . Alcohol use: Yes    Comment: [place]  . Drug use: No  . Sexual activity: Not Currently    Birth control/protection: Surgical    Comment: intercourse age 83, less than 5 sexual partners,des neg  Other Topics Concern  . Not on file  Social History Narrative  .  Not on file   Social Determinants of Health   Financial Resource Strain:   . Difficulty of Paying Living Expenses: Not on file  Food Insecurity:   . Worried About Charity fundraiser in the Last Year: Not on file  . Ran Out of Food in the Last Year: Not on file  Transportation Needs:   . Lack of Transportation (Medical): Not on file  . Lack of Transportation (Non-Medical): Not on file  Physical Activity:   . Days of Exercise per Week: Not on file  . Minutes of Exercise per Session: Not on file  Stress:   . Feeling of Stress : Not on file  Social Connections:   . Frequency of Communication with  Friends and Family: Not on file  . Frequency of Social Gatherings with Friends and Family: Not on file  . Attends Religious Services: Not on file  . Active Member of Clubs or Organizations: Not on file  . Attends Archivist Meetings: Not on file  . Marital Status: Not on file   Allergies  Allergen Reactions  . Codeine Nausea Only   Family History  Problem Relation Age of Onset  . Stroke Mother   . Hypertension Mother   . Diabetes Mother   . Cancer Mother        basel cell cancer  . Thyroid disease Mother   . Arthritis Other   . Colon polyps Father 49       "CANCEROUS POLYPS REMOVED THIS YEAR"  . Asthma Brother   . Thyroid disease Maternal Grandmother   . Lymphoma Maternal Grandmother   . Osteoporosis Other   . Breast cancer Maternal Aunt        mat great aunt  . Diabetes Paternal Grandmother   . Heart disease Paternal Grandmother   . Colon cancer Neg Hx     Current Outpatient Medications (Endocrine & Metabolic):  .  levothyroxine (SYNTHROID) 88 MCG tablet, TAKE 1 TABLET BY MOUTH EVERY MORNING.    Current Outpatient Medications (Analgesics):  .  acetaminophen (TYLENOL) 325 MG tablet, Take 650 mg by mouth every 6 (six) hours as needed for mild pain or headache. .  allopurinol (ZYLOPRIM) 100 MG tablet, Take 2 tablets (200 mg total) by mouth daily. .  colchicine 0.6 MG tablet, Take 1 tablet (0.6 mg total) by mouth daily. .  traMADol (ULTRAM) 50 MG tablet, Take 1 tablet (50 mg total) by mouth every 6 (six) hours as needed.  Current Outpatient Medications (Hematological):  Marland Kitchen  Cyanocobalamin (B-12 PO), Take 5 mLs by mouth as needed (low b12).  Current Outpatient Medications (Other):  Marland Kitchen  CALCIUM PO, Take 5 mLs by mouth daily. .  carBAMazepine (TEGRETOL PO), Take 100 mg by mouth 2 (two) times daily. .  clobetasol (TEMOVATE) 0.05 % external solution,  .  Diclofenac Sodium (PENNSAID) 2 % SOLN, Place 1 application onto the skin 2 (two) times daily. .  DULoxetine  (CYMBALTA) 60 MG capsule, Take 1 capsule (60 mg total) by mouth daily. Marland Kitchen  gabapentin (NEURONTIN) 100 MG capsule, Take 2 capsules (200 mg total) by mouth at bedtime. .  Multiple Vitamins-Minerals (MULTIVITAMIN ADULT PO), Take 1 tablet by mouth daily. Marland Kitchen  omeprazole (PRILOSEC) 20 MG capsule, TAKE 2 CAPSULES BY MOUTH DAILY. Marland Kitchen  topiramate (TOPAMAX) 50 MG tablet, Take 1 tablet by mouth daily.   Reviewed prior external information including notes and imaging from  primary care provider As well as notes that were available from care everywhere  and other healthcare systems.  Past medical history, social, surgical and family history all reviewed in electronic medical record.  No pertanent information unless stated regarding to the chief complaint.   Review of Systems:  No headache, visual changes, nausea, vomiting, diarrhea, constipation, dizziness, abdominal pain, skin rash, fevers, chills, night sweats, weight loss, swollen lymph nodes,  joint swelling, chest pain, shortness of breath, mood changes. POSITIVE muscle aches  body aches, Objective  Blood pressure 124/78, pulse 63, height 5' 3.5" (1.613 m), weight 178 lb (80.7 kg), SpO2 98 %.   General: No apparent distress alert and oriented x3 mood and affect normal, dressed appropriately.  HEENT: Pupils equal, extraocular movements intact  Respiratory: Patient's speak in full sentences and does not appear short of breath  Cardiovascular: Trace lower extremity edema, non tender, no erythema  Skin: Warm dry intact with no signs of infection or rash on extremities or on axial skeleton.  Abdomen: Soft nontender  Neuro: Cranial nerves II through XII are intact, neurovascularly intact in all extremities with 2+ DTRs and 2+ pulses.  Lymph: No lymphadenopathy of posterior or anterior cervical chain or axillae bilaterally.  Gait antalgic walking with the aid of a cane MSK: Right shoulder exam does have significant decrease in range of motion.  Voluntary  guarding noted.  Unable to do full exam secondary to the amount of pain patient today.    Impression and Recommendations:     This case required medical decision making of moderate complexity. The above documentation has been reviewed and is accurate and complete Lyndal Pulley, DO       Note: This dictation was prepared with Dragon dictation along with smaller phrase technology. Any transcriptional errors that result from this process are unintentional.

## 2019-03-30 NOTE — Patient Instructions (Addendum)
Good to see you Shoulder xray today Toradol injection given today to help the pain.  I will send you a message after the MRI and discuss next steps

## 2019-03-30 NOTE — Therapy (Addendum)
[facility] [address] [place], [place], [address] Phone: [phone]   Fax:  [phone]  Physical Therapy Treatment  Patient Details  Name: Kelli Bell MRN: [id number] Date of Birth: 11-23-1960 Referring Provider (PT): Cathlean Cower, MD   Encounter Date: 03/30/2019  PT End of Session - 03/30/19 1055    Visit Number  3    Date for PT Re-Evaluation  05/15/19    PT Start Time  7001    PT Stop Time  1055    PT Time Calculation (min)  40 min    Activity Tolerance  Patient tolerated treatment well;Patient limited by pain    Behavior During Therapy  [facility] for tasks assessed/performed       Past Medical History:  Diagnosis Date  . ANXIETY 03/30/2007  . BACK PAIN 02/20/2009  . Essential hypertension, benign 05/31/2012  . Family history of anesthesia complication    "mother has hard time waking up"  . GERD 01/13/2007  . H/O hiatal hernia   . HYPERLIPIDEMIA 02/22/2008  . HYPOTHYROIDISM 03/30/2007  . MENOPAUSAL DISORDER 02/23/2008  . Nocturia   . OBSTRUCTIVE SLEEP APNEA 06/29/2007   has not used c pap x 8 months due to wt loss  . Osteoarthritis   . SINUSITIS- ACUTE-NOS 03/30/2007  . THORACIC/LUMBOSACRAL NEURITIS/RADICULITIS UNSPEC 02/23/2008  . Wheezing 01/08/2009    Past Surgical History:  Procedure Laterality Date  . ABDOMINAL HYSTERECTOMY  2004   with BSO  . BREATH TEK H PYLORI N/A 05/05/2013   Procedure: BREATH TEK H PYLORI;  Surgeon: Shann Medal, MD;  Location: [facility];  Service: General;  Laterality: N/A;  . CHOLECYSTECTOMY  1980  . GASTRIC ROUX-EN-Y N/A 09/12/2013   Procedure: LAPAROSCOPIC ROUX-EN-Y GASTRIC BYPASS WITH UPPER ENDOSCOPY WITH ENTEROLYSIS OF ADHESIONS;  Surgeon: Shann Medal, MD;  Location: [facility];  Service: General;  Laterality: N/A;  . jaw bone surgury     59 yo  . TONSILLECTOMY      There were no vitals filed for this visit.  Subjective Assessment - 03/30/19 1019    Subjective  Saw Dr.  Ellene Route yesterday and he ordered MRI of neck and back.    Pertinent History  sleep apnea, pseudo gout.  Gastric bypass (2015)- 120 lbs lost.  Recently gained 40 lbs due to immobility associated with weakness    Patient Stated Goals  return to driving, walk independently without device, care for grandchildren    Currently in Pain?  Yes    Pain Score  3     Pain Location  Back    Pain Orientation  Right;Lower    Pain Descriptors / Indicators  Aching    Multiple Pain Sites  Yes    Pain Score  6    Pain Location  Shoulder    Pain Orientation  Right    Pain Descriptors / Indicators  Aching;Sore                       [facility] Adult PT Treatment/Exercise - 03/30/19 0001      Knee/Hip Exercises: Stretches   Hip Flexor Stretch  Right;2 reps;30 seconds    Hip Flexor Stretch Limitations  seated lunge      Knee/Hip Exercises: Aerobic   Nustep  L1 x 5 min at 75 SPM      Knee/Hip Exercises: Seated   Long Arc Quad  Right;2 sets;10 reps;Weights    Long Arc Quad Weight  2 lbs.  Marching  Both;10 reps    Abduction/Adduction   Both;2 sets;10 reps    Abd/Adduction Limitations  1 set green, 1 blue    Sit to [employer]  10 reps      Shoulder Exercises: Standing   Row  Both;10 reps;Theraband    Theraband Level (Shoulder Row)  Level 2 (Red)      Shoulder Exercises: ROM/Strengthening   Other ROM/Strengthening Exercises  Reviewed wall slides x 1-2 reps    Other ROM/Strengthening Exercises  attempted supine cane flexion - unable to do due to pain      Shoulder Exercises: Isometric Strengthening   Flexion  5X10"    Flexion Limitations  arm at 90 deg    Extension  5X10"    External Rotation  5X10"    Internal Rotation  5X10"    ABduction  5X10"    ABduction Limitations  straight arm             PT Education - 03/30/19 2105    Education Details  HEP progressed with seated hip flexor stretch    Person(s) Educated  Patient    Methods  Explanation;Demonstration;Handout     Comprehension  Verbalized understanding;Returned demonstration       PT Short Term Goals - 03/20/19 1454      PT SHORT TERM GOAL #1   Title  be independent in initial HEP    Time  4    Period  Weeks    Status  New    Target Date  04/17/19      PT SHORT TERM GOAL #2   Title  improve LE strength to walk for 8-10 minutes to improve independence in the community    Time  4    Period  Weeks    Status  New    Target Date  04/17/19      PT SHORT TERM GOAL #3   Title  being to use Rt UE for ADLs and self-care and report > or = to 25% use of the Rt UE    Time  4    Period  Weeks    Status  New    Target Date  04/17/19      PT SHORT TERM GOAL #4   Title  perform 5x sit to stand in < or = to 20 seconds to reduce falls risk    Time  4    Period  Weeks    Status  New    Target Date  04/17/19        PT Long Term Goals - 03/20/19 1534      PT LONG TERM GOAL #1   Title  be independent in advanced HEP    Time  8    Period  Weeks    Status  New    Target Date  05/15/19      PT LONG TERM GOAL #2   Title  reduce FOTO to < or = to 49% limitation    Time  8    Period  Weeks    Status  New    Target Date  05/15/19      PT LONG TERM GOAL #3   Title  reduce Rt UE pain to improve use of Rt UE with self-care and ADLs to > or = to 75%    Time  8    Period  Weeks    Status  New    Target Date  05/15/19  PT LONG TERM GOAL #4   Title  improve endurance to walk for 10-15 minutes in the community to improve independence    Time  8    Period  Weeks    Status  New    Target Date  05/15/19      PT LONG TERM GOAL #5   Title  demonstrate > or = to 4/5 bil LE strength to improve safety at home and in the community    Time  8    Period  Weeks    Status  New    Target Date  05/15/19      Additional Long Term Goals   Additional Long Term Goals  Yes      PT LONG TERM GOAL #6   Title  perform 5x sit to stand in < or = to 15 seconds to improve balance    Time  8    Period   Weeks    Status  New    Target Date  05/15/19            Plan - 03/30/19 2106    Clinical Impression Statement  Patient did well with TE today. We removed AA ABD for shoulder from HEP as this causes too much pain. She did well with LE strenghtening. LTGs are ongoing. MRI being scheduled by Dr. Ellene Route.    Comorbidities  Rt shoulder bursitis, chronic lumbar pain, recent onset of undiagnosed symptoms    PT Frequency  2x / week    PT Duration  8 weeks    PT Treatment/Interventions  ADLs/Self Care Home Management;Cryotherapy;Electrical Stimulation;Ultrasound;Moist Heat;Gait training;Stair training;Functional mobility training;Therapeutic activities;Therapeutic exercise;Neuromuscular re-education;Manual techniques;Patient/family education;Passive range of motion;Taping    PT Next Visit Plan  continue to address gait as needed,  gentle Rt UE ROM or isometrics, seated LE strength, NuStep       Patient will benefit from skilled therapeutic intervention in order to improve the following deficits and impairments:  Abnormal gait, Decreased activity tolerance, Decreased balance, Decreased mobility, Decreased strength, Postural dysfunction, Improper body mechanics, Impaired flexibility, Pain, Decreased endurance, Increased muscle spasms, Difficulty walking  Visit Diagnosis: Chronic bilateral low back pain with right-sided sciatica  Pain in right hip  Chronic pain of right knee  Chronic right shoulder pain  Muscle weakness (generalized)  Other abnormalities of gait and mobility     Problem List Patient Active Problem List   Diagnosis Date Noted  . Calcific bursitis of shoulder 03/09/2019  . Pseudogout 03/09/2019  . Polyarthralgia 10/04/2018  . Alopecia areata 10/04/2018  . 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 Viral illness 12/24/2017  . Myalgia 12/21/2017  . Dizziness 08/22/2016  . Grief reaction 10/31/2015  . Trigeminal neuralgia of left side of face 05/10/2014  . Acute sinus infection  12/27/2013  . Allergic rhinitis 12/27/2013  . Nausea 10/02/2013  . Preop exam for internal medicine 09/05/2013  . Morbid obesity, weight - 206, BMI - 36.6 04/12/2013  . Postmenopausal hormone replacement therapy 05/22/2011  . Encounter for well adult exam with abnormal findings 07/21/2010  . BACK PAIN 02/20/2009  . THORACIC/LUMBOSACRAL NEURITIS/RADICULITIS UNSPEC 02/23/2008  . Hyperlipidemia 02/22/2008  . VERTIGO 11/23/2007  . OBSTRUCTIVE SLEEP APNEA 06/29/2007  . FATIGUE 06/29/2007  . Hypothyroidism 03/30/2007  . Anxiety state 03/30/2007  . Depression 03/30/2007  . GERD 01/13/2007    Madelyn Flavors PT 03/30/2019, 9:09 PM PHYSICAL THERAPY DISCHARGE SUMMARY  Visits from Start of Care: 3  Current functional level related to goals / functional outcomes: See  above for current status.  Pt didn't return to PT.     Remaining deficits: See above.     Education / Equipment: HEP  Plan: Patient agrees to discharge.  Patient goals were partially met. Patient is being discharged due to not returning since the last visit.  ?????        Sigurd Sos, PT 05/16/19 7:58 AM  [facility] [address] [place], [place], [address] Phone: [phone]   Fax:  [phone]  Name: Keaghan Bowens MRN: [id number] Date of Birth: 1960-02-03

## 2019-03-30 NOTE — Patient Instructions (Signed)
Access Code: [email or web address]  URL: [email or web address]  Date: 03/30/2019  Prepared by: Almyra Free Jermine Bibbee   Exercises Isometric Shoulder Flexion at [place] - 5 reps - 1 sets - 10 sec hold - 1x daily - 7x weekly Isometric Shoulder Abduction - Arm Straight at Wall - 5 reps - 1 sets - 10 sec hold - 1x daily - 7x weekly Isometric Shoulder Extension at Wall - 5 reps - 1 sets - 10 sec hold - 1x daily - 7x weekly Standing Isometric Shoulder Internal Rotation at Doorway - 5 reps - 1 sets - 10 sec hold - 1x daily - 7x weekly Standing Isometric Shoulder External Rotation with Doorway - 5 reps - 1 sets - 10 sec hold - 1x daily - 7x weekly Shoulder Flexion Wall Slide with Towel - 3-5 reps - 1 sets - 1x daily - 7x weekly Shoulder Scaption Wall Slide with Towel - 3-5 reps - 1 sets - 1x daily - 7x weekly Seated Hip Flexor Stretch - 3 reps - 1 sets - 30-60 sec hold - 2x daily - 7x weekly Patient Education Reciprocal Gait with [employer]

## 2019-04-03 ENCOUNTER — Telehealth: Payer: Self-pay | Admitting: Family Medicine

## 2019-04-03 ENCOUNTER — Other Ambulatory Visit: Payer: Self-pay

## 2019-04-03 DIAGNOSIS — G8929 Other chronic pain: Secondary | ICD-10-CM

## 2019-04-03 DIAGNOSIS — C9000 Multiple myeloma not having achieved remission: Secondary | ICD-10-CM

## 2019-04-03 MED FILL — ALLOPURINOL 100 MG TABS: 100 | 30 days supply | Qty: 60 | Fill #1

## 2019-04-03 NOTE — Telephone Encounter (Signed)
Patient called stating that she is having an MRI done on her neck at [facility] that was ordered by Dr Tollie Pizza. She said that Dr Tamala Julian was wanting her to have an MRI done of her shoulder. They said that if we can send the order over to them, they can do both the neck and shoulder at the same time.  The order would need to be send to: [facility] ATTNTruman Hayward  Fax: [phone]

## 2019-04-03 NOTE — Telephone Encounter (Signed)
Faxed to [facility]

## 2019-04-03 NOTE — Telephone Encounter (Signed)
Patient called back to follow up. She wanted to make sure that it was faxed to [facility] at the number below. He will be looking out for it. Can you help me with this?

## 2019-04-03 NOTE — Telephone Encounter (Signed)
MRI is ordered to be sent to [facility]. Patient notified.

## 2019-04-04 DIAGNOSIS — G8929 Other chronic pain: Secondary | ICD-10-CM | POA: Insufficient documentation

## 2019-04-08 ENCOUNTER — Encounter: Payer: Self-pay | Admitting: Family Medicine

## 2019-04-11 ENCOUNTER — Encounter: Payer: BC Managed Care – PPO | Admitting: Physical Therapy

## 2019-04-11 ENCOUNTER — Telehealth: Payer: Self-pay | Admitting: Hematology and Oncology

## 2019-04-11 NOTE — Telephone Encounter (Signed)
Received a new pt referral from Dr. Tamala Julian at [facility] for possible multiple myeloma. Pt's daughter called to schedule a new pt appt for suspicion of multiple myeloma. Ms. Mccullum has been scheduled to see Dr. Lorenso Courier on 3/18 at 8am w/labs at 9:15am. Aware to arrive 15 minutes early.

## 2019-04-12 DIAGNOSIS — M7580 Other shoulder lesions, unspecified shoulder: Secondary | ICD-10-CM | POA: Diagnosis not present

## 2019-04-12 DIAGNOSIS — M4712 Other spondylosis with myelopathy, cervical region: Secondary | ICD-10-CM | POA: Diagnosis not present

## 2019-04-12 DIAGNOSIS — G8929 Other chronic pain: Secondary | ICD-10-CM | POA: Diagnosis not present

## 2019-04-12 DIAGNOSIS — M542 Cervicalgia: Secondary | ICD-10-CM | POA: Diagnosis not present

## 2019-04-12 NOTE — Progress Notes (Signed)
[place] [phone]   Fax:([phone]) [place] NOTE  Patient Care Team: Biagio Borg, MD as PCP - Burns Spain, Lowella Bandy, MD (Inactive) as Consulting Physician (Gastroenterology) Huel Cote, NP as Nurse Practitioner (Obstetrics and Gynecology) Arbutus Leas, PhD as Consulting Physician (Psychology)  Hematological/Oncological History # Lytic Bone Lesions, Concern for Multiple Myeloma 1) 03/30/2019: patient had shoulder X-ray performed due to chronic right shoulder pain. Imaging showed scattered proximal lucency suspicious for myeloma 2) 04/12/2019: establish care with Dr. Lorenso Courier   CHIEF COMPLAINTS/PURPOSE OF CONSULTATION:  "Lytic Bone Lesions "  HISTORY OF PRESENTING ILLNESS:  Kelli Bell 59 y.o. female with medical history significant for Gastric bipass in 2015, trigeminal neuralgia, HTN, HLD, hypothyroidism, and anxiety who presents for evaluation of lytic lesions found incidentally on imaging.   On review of the previous records Kelli Bell was last seen by sports medicine on 03/30/2019 due to concern for chronic right shoulder pain.  At that time she had an x-ray of the shoulder performed which showed scattered proximal lucencies suspicious for multiple myeloma.  Due to concern for these findings the patient was referred to hematology for further evaluation and management.  Of note the patient had a CMP last performed on 10/04/2018 at which time patient was found to have a creatinine of 0.57, a calcium of 9.0, and normal liver function.  A CBC performed on the same date showed a white blood cell count of 5.8, hemoglobin of 13.0, and a platelet count of 316.  Of note the patient had a lymphocytic predominance with 48% lymphocytes and 48.4% neutrophils.  The absolute neutrophil count was 2400, and the lymphocyte was 2800.    On exam today Kelli Bell notes that her symptoms began in July 2020.  She reports to begin with weakness on the right side and  worsening pain in the right shoulder.  As part of her initial evaluation she had a positive ANA and was sent to rheumatology for further work-up.  She was diagnosed with fibromyalgia at that time.  Furthermore she was experiencing hair loss and was seen by dermatologist and received scalp injections to try to help with the loss of hair.  When the shoulder pain persisted her primary care provider referred her to sports medicine who performed 2 injections into the hip.  More recently repeat imaging of the shoulder was obtained which showed small lucencies on the right proximal humerus.  Due to concern for these lesions she was referred to [place] for further evaluation.  On further discussion the patient notes that she had a Roux-en-Y gastric bypass in 2015.  She notes that she has had a 40 pound weight gain due to her inability to exercise.  She is a former smoker who quit in 1999.  She does have shoulder pain as well as some back pain, but no other overt bone pain.  She has had issues with nausea which she thinks are related to the pain.  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  A full 10 point ROS is listed below.  MEDICAL HISTORY:  Past Medical History:  Diagnosis Date  . ANXIETY 03/30/2007  . BACK PAIN 02/20/2009  . Essential hypertension, benign 05/31/2012  . Family history of anesthesia complication    "mother has hard time waking up"  . GERD 01/13/2007  . H/O hiatal hernia   . HYPERLIPIDEMIA  02/22/2008  . HYPOTHYROIDISM 03/30/2007  . MENOPAUSAL DISORDER 02/23/2008  . Nocturia   . OBSTRUCTIVE SLEEP APNEA 06/29/2007   has not used c pap x 8 months due to wt loss  . Osteoarthritis   . SINUSITIS- ACUTE-NOS 03/30/2007  . THORACIC/LUMBOSACRAL NEURITIS/RADICULITIS UNSPEC 02/23/2008  . Wheezing 01/08/2009    SURGICAL HISTORY: Past Surgical History:  Procedure Laterality Date  . ABDOMINAL HYSTERECTOMY  2004   with  BSO  . BREATH TEK H PYLORI N/A 05/05/2013   Procedure: BREATH TEK H PYLORI;  Surgeon: Shann Medal, MD;  Location: [facility];  Service: General;  Laterality: N/A;  . CHOLECYSTECTOMY  1980  . GASTRIC ROUX-EN-Y N/A 09/12/2013   Procedure: LAPAROSCOPIC ROUX-EN-Y GASTRIC BYPASS WITH UPPER ENDOSCOPY WITH ENTEROLYSIS OF ADHESIONS;  Surgeon: Shann Medal, MD;  Location: [facility];  Service: General;  Laterality: N/A;  . jaw bone surgury     60 yo  . TONSILLECTOMY      SOCIAL HISTORY: Social History   Socioeconomic History  . Marital status: Widowed    Spouse name: Not on file  . Number of children: Not on file  . Years of education: Not on file  . Highest education level: Not on file  Occupational History  . Not on file  Tobacco Use  . Smoking status: Former Smoker    Quit date: 07/06/1997    Years since quitting: 21.7  . Smokeless tobacco: Never Used  Substance and Sexual Activity  . Alcohol use: Yes    Comment: [place]  . Drug use: No  . Sexual activity: Not Currently    Birth control/protection: Surgical    Comment: intercourse age 44, less than 5 sexual partners,des neg  Other Topics Concern  . Not on file  Social History Narrative  . Not on file   Social Determinants of Health   Financial Resource Strain:   . Difficulty of Paying Living Expenses:   Food Insecurity:   . Worried About Charity fundraiser in the Last Year:   . Arboriculturist in the Last Year:   Transportation Needs:   . Film/video editor (Medical):   Marland Kitchen Lack of Transportation (Non-Medical):   Physical Activity:   . Days of Exercise per Week:   . Minutes of Exercise per Session:   Stress:   . Feeling of Stress :   Social Connections:   . Frequency of Communication with Friends and Family:   . Frequency of Social Gatherings with Friends and Family:   . Attends Religious Services:   . Active Member of Clubs or Organizations:   . Attends Archivist Meetings:   Marland Kitchen  Marital Status:   Intimate Partner Violence:   . Fear of Current or Ex-Partner:   . Emotionally Abused:   Marland Kitchen Physically Abused:   . Sexually Abused:     FAMILY HISTORY: Family History  Problem Relation Age of Onset  . Stroke Mother   . Hypertension Mother   . Diabetes Mother   . Cancer Mother        basel cell cancer  . Thyroid disease Mother   . Arthritis Other   . Colon polyps Father 69       "CANCEROUS POLYPS REMOVED THIS YEAR"  . Asthma Brother   . Thyroid disease Maternal Grandmother   . Lymphoma Maternal Grandmother   . Osteoporosis Other   . Breast cancer Maternal Aunt        mat great aunt  .  Diabetes Paternal Grandmother   . Heart disease Paternal Grandmother   . Colon cancer Neg Hx     ALLERGIES:  is allergic to codeine.  MEDICATIONS:  Current Outpatient Medications  Medication Sig Dispense Refill  . diclofenac Sodium (VOLTAREN) 1 % GEL Apply 2 g topically 4 (four) times daily. As needed for right shoulder    . acetaminophen (TYLENOL) 325 MG tablet Take 650 mg by mouth every 6 (six) hours as needed for mild pain or headache.    . allopurinol (ZYLOPRIM) 100 MG tablet Take 2 tablets (200 mg total) by mouth daily. 60 tablet 6  . CALCIUM PO Take 5 mLs by mouth daily.    . carBAMazepine (TEGRETOL PO) Take 100 mg by mouth 2 (two) times daily.    . clobetasol (TEMOVATE) 0.05 % external solution     . Cyanocobalamin (B-12 PO) Take 5 mLs by mouth as needed (low b12).    . Diclofenac Sodium (PENNSAID) 2 % SOLN Place 1 application onto the skin 2 (two) times daily. 1 Bottle 3  . DULoxetine (CYMBALTA) 60 MG capsule Take 1 capsule (60 mg total) by mouth daily. 90 capsule 3  . gabapentin (NEURONTIN) 100 MG capsule Take 2 capsules (200 mg total) by mouth at bedtime. 180 capsule 0  . levothyroxine (SYNTHROID) 88 MCG tablet TAKE 1 TABLET BY MOUTH EVERY MORNING. 90 tablet 3  . Multiple Vitamins-Minerals (MULTIVITAMIN ADULT PO) Take 1 tablet by mouth daily.    Marland Kitchen omeprazole  (PRILOSEC) 20 MG capsule TAKE 2 CAPSULES BY MOUTH DAILY. 180 capsule 1  . topiramate (TOPAMAX) 50 MG tablet Take 1 tablet by mouth daily.    . traMADol (ULTRAM) 50 MG tablet Take 1 tablet (50 mg total) by mouth every 6 (six) hours as needed. 60 tablet 3   No current facility-administered medications for this visit.    REVIEW OF SYSTEMS:   Constitutional: ( - ) fevers, ( - )  chills , ( - ) night sweats Eyes: ( - ) blurriness of vision, ( - ) double vision, ( - ) watery eyes Ears, nose, mouth, throat, and face: ( - ) mucositis, ( - ) sore throat Respiratory: ( - ) cough, ( - ) dyspnea, ( - ) wheezes Cardiovascular: ( - ) palpitation, ( - ) chest discomfort, ( - ) lower extremity swelling Gastrointestinal:  ( - ) nausea, ( - ) heartburn, ( - ) change in bowel habits Skin: ( - ) abnormal skin rashes Lymphatics: ( - ) new lymphadenopathy, ( - ) easy bruising Neurological: ( - ) numbness, ( - ) tingling, ( - ) new weaknesses Behavioral/Psych: ( - ) mood change, ( - ) new changes  All other systems were reviewed with the patient and are negative.  PHYSICAL EXAMINATION: ECOG PERFORMANCE STATUS: 1 - Symptomatic but completely ambulatory  Vitals:   04/13/19 0819  BP: 123/79  Pulse: 71  Resp: 17  Temp: 98 F (36.7 C)  SpO2: 98%   Filed Weights   04/13/19 0819  Weight: 183 lb 4.8 oz (83.1 kg)    GENERAL: well appearing middle aged Caucasian female in NAD  SKIN: skin color, texture, turgor are normal, no rashes or significant lesions EYES: conjunctiva are pink and non-injected, sclera clear LUNGS: clear to auscultation and percussion with normal breathing effort HEART: regular rate & rhythm and no murmurs and no lower extremity edema ABDOMEN: soft, non-tender, non-distended, normal bowel sounds. No HSM. Musculoskeletal: no cyanosis of digits and no clubbing  PSYCH: alert & oriented x 3, fluent speech NEURO: no focal motor/sensory deficits  LABORATORY DATA:  I have reviewed the  data as listed CBC Latest Ref Rng & Units 10/04/2018 12/21/2017 11/01/2017  WBC 4.0 - 10.5 K/uL 5.8 5.0 5.1  Hemoglobin 12.0 - 15.0 g/dL 13.0 13.1 12.7  Hematocrit 36.0 - 46.0 % 38.8 39.1 37.8  Platelets 150.0 - 400.0 K/uL 316.0 310.0 291.0    CMP Latest Ref Rng & Units 10/04/2018 12/21/2017 11/01/2017  Glucose 70 - 99 mg/dL 85 89 96  BUN 6 - 23 mg/dL '9 12 13  ' Creatinine 0.40 - 1.20 mg/dL 0.57 0.55 0.60  Sodium 135 - 145 mEq/L 139 138 140  Potassium 3.5 - 5.1 mEq/L 3.9 4.3 4.6  Chloride 96 - 112 mEq/L 105 102 103  CO2 19 - 32 mEq/L 24 29 33(H)  Calcium 8.4 - 10.5 mg/dL 9.0 9.0 8.8  Total Protein 6.0 - 8.3 g/dL 6.8 - 6.4  Total Bilirubin 0.2 - 1.2 mg/dL 0.2 - 0.3  Alkaline Phos 39 - 117 U/L 107 - 98  AST 0 - 37 U/L 20 - 16  ALT 0 - 35 U/L 17 - 13    PATHOLOGY: None relevant to review.    RADIOGRAPHIC STUDIES: I have personally reviewed the radiological images as listed and agreed with the findings in the report: small lucencies on the proximal humerus.   DG Shoulder Right  Result Date: 03/30/2019 CLINICAL DATA:  Chronic right shoulder pain, initial encounter EXAM: RIGHT SHOULDER - 2+ VIEW COMPARISON:  None. FINDINGS: Degenerative changes of the acromioclavicular joint and glenohumeral joint are seen. No acute fracture or dislocation is noted. No soft tissue abnormality is seen. There are some vague scattered lucency is identified within the proximal humeral shaft. Clinical correlation is recommended to rule out abnormality such as myeloma. No other focal abnormality is seen. IMPRESSION: No acute bony abnormality is noted. Degenerative changes are noted as described. Scattered proximal lucency suspicious for myeloma. Further workup is recommended. Electronically Signed   By: Inez Catalina M.D.   On: 03/30/2019 20:42    ASSESSMENT & PLAN Kelli Bell 59 y.o. female with medical history significant for HTN, HLD, hypothyroidism, and anxiety who presents for evaluation of lytic lesions found  incidentally on imaging.  After review of the imaging and the patient's previous labs the findings are suspicious for lytic lesions on the bone.  The patient previously had studies performed in September 2020 which showed normal hemoglobin, creatinine, and calcium.  Overall I think her clinical picture is reassuring, however we will conduct a thorough work-up to assure these not being caused by multiple myeloma.  Of note the patient had a mammogram performed last year and is up-to-date on her colonoscopy which was performed in 2012.  We will start by ordering a DG bone survey to assess for lytic lesions elsewhere in the body.  We will do a full evaluation of the serum with SPEP and serum free light chains.  Additionally we will collect a 24-hour urine for UPEP. If the above studies reveal to clear etiology or lytic lesions require further investigation we will d/c the patient to the care of her PCP.   # Lytic Bone Lesions, Concern for Multiple Myeloma --today will order a CBC, CMP, and LDH --additionally will order an SPEP, UPEP, SFLC.  --at this time it is reassuring the patient has normal Hgb, Cr, Ca in Sept 2020 --will order a DG Bone survey to assess for lytic lesions elsewhere --  patient is up to date on cancer screenings including breast cancer and colon cancer. No focal symptoms concerning for malignancy elsewhere --RTC based on the above findings  Orders Placed This Encounter  Procedures  . DG Bone Survey Met    Standing Status:   Future    Standing Expiration Date:   06/12/2020    Order Specific Question:   Reason for Exam (SYMPTOM  OR DIAGNOSIS REQUIRED)    Answer:   bone lucencies on shoulder x-ray    Order Specific Question:   Is patient pregnant?    Answer:   No    Order Specific Question:   Preferred imaging location?    Answer:   [facility]    Order Specific Question:   Radiology Contrast Protocol - do NOT remove file path    Answer:    [email or web address]  . CBC with Differential ([place] Only)    Standing Status:   Future    Number of Occurrences:   1    Standing Expiration Date:   04/12/2020  . CMP ([place] only)    Standing Status:   Future    Number of Occurrences:   1    Standing Expiration Date:   04/12/2020  . Lactate dehydrogenase (LDH)    Standing Status:   Future    Number of Occurrences:   1    Standing Expiration Date:   04/12/2020  . Kappa/lambda light chains    Standing Status:   Future    Number of Occurrences:   1    Standing Expiration Date:   04/12/2020  . Multiple Myeloma Panel (SPEP&IFE w/QIG)    Standing Status:   Future    Number of Occurrences:   1    Standing Expiration Date:   04/12/2020  . Beta 2 microglobulin    Standing Status:   Future    Number of Occurrences:   1    Standing Expiration Date:   04/12/2020  . 24-Hr Ur UPEP/UIFE/Light Chains/TP    Standing Status:   Future    Standing Expiration Date:   04/12/2020    All questions were answered. The patient knows to call the clinic with any problems, questions or concerns.  A total of more than 60 minutes were spent on this encounter and over half of that time was spent on counseling and coordination of care as outlined above.   Ledell Peoples, MD Department of Hematology/Oncology [place] at [facility] Phone: [phone] Pager: [phone] Email: [email or web address]  04/13/2019 9:35 AM

## 2019-04-13 ENCOUNTER — Encounter: Payer: BC Managed Care – PPO | Admitting: Physical Therapy

## 2019-04-13 ENCOUNTER — Encounter: Payer: Self-pay | Admitting: Hematology and Oncology

## 2019-04-13 ENCOUNTER — Inpatient Hospital Stay: Payer: BC Managed Care – PPO

## 2019-04-13 ENCOUNTER — Other Ambulatory Visit: Payer: Self-pay

## 2019-04-13 ENCOUNTER — Inpatient Hospital Stay: Payer: BC Managed Care – PPO | Attending: Hematology and Oncology | Admitting: Hematology and Oncology

## 2019-04-13 VITALS — BP 123/79 | HR 71 | Temp 98.0°F | Resp 17 | Ht 63.5 in | Wt 183.3 lb

## 2019-04-13 DIAGNOSIS — E785 Hyperlipidemia, unspecified: Secondary | ICD-10-CM | POA: Diagnosis not present

## 2019-04-13 DIAGNOSIS — M899 Disorder of bone, unspecified: Secondary | ICD-10-CM

## 2019-04-13 DIAGNOSIS — M791 Myalgia, unspecified site: Secondary | ICD-10-CM

## 2019-04-13 DIAGNOSIS — G500 Trigeminal neuralgia: Secondary | ICD-10-CM | POA: Insufficient documentation

## 2019-04-13 DIAGNOSIS — R937 Abnormal findings on diagnostic imaging of other parts of musculoskeletal system: Secondary | ICD-10-CM | POA: Diagnosis not present

## 2019-04-13 DIAGNOSIS — Z9884 Bariatric surgery status: Secondary | ICD-10-CM | POA: Insufficient documentation

## 2019-04-13 DIAGNOSIS — Z87891 Personal history of nicotine dependence: Secondary | ICD-10-CM | POA: Diagnosis not present

## 2019-04-13 DIAGNOSIS — I10 Essential (primary) hypertension: Secondary | ICD-10-CM | POA: Diagnosis not present

## 2019-04-13 DIAGNOSIS — M797 Fibromyalgia: Secondary | ICD-10-CM | POA: Diagnosis not present

## 2019-04-13 DIAGNOSIS — G8929 Other chronic pain: Secondary | ICD-10-CM | POA: Insufficient documentation

## 2019-04-13 DIAGNOSIS — E039 Hypothyroidism, unspecified: Secondary | ICD-10-CM | POA: Diagnosis not present

## 2019-04-13 DIAGNOSIS — M25511 Pain in right shoulder: Secondary | ICD-10-CM | POA: Diagnosis not present

## 2019-04-13 LAB — CBC WITH DIFFERENTIAL (CANCER CENTER ONLY)
Abs Immature Granulocytes: 0.04 10*3/uL (ref 0.00–0.07)
Basophils Absolute: 0.1 10*3/uL (ref 0.0–0.1)
Basophils Relative: 1 %
Eosinophils Absolute: 0.1 10*3/uL (ref 0.0–0.5)
Eosinophils Relative: 2 %
HCT: 39.8 % (ref 36.0–46.0)
Hemoglobin: 13.2 g/dL (ref 12.0–15.0)
Immature Granulocytes: 1 %
Lymphocytes Relative: 31 %
Lymphs Abs: 1.7 10*3/uL (ref 0.7–4.0)
MCH: 30.6 pg (ref 26.0–34.0)
MCHC: 33.2 g/dL (ref 30.0–36.0)
MCV: 92.3 fL (ref 80.0–100.0)
Monocytes Absolute: 0.7 10*3/uL (ref 0.1–1.0)
Monocytes Relative: 12 %
Neutro Abs: 2.9 10*3/uL (ref 1.7–7.7)
Neutrophils Relative %: 53 %
Platelet Count: 299 10*3/uL (ref 150–400)
RBC: 4.31 MIL/uL (ref 3.87–5.11)
RDW: 12.2 % (ref 11.5–15.5)
WBC Count: 5.4 10*3/uL (ref 4.0–10.5)
nRBC: 0 % (ref 0.0–0.2)

## 2019-04-13 LAB — CMP (CANCER CENTER ONLY)
ALT: 25 U/L (ref 0–44)
AST: 27 U/L (ref 15–41)
Albumin: 3.8 g/dL (ref 3.5–5.0)
Alkaline Phosphatase: 86 U/L (ref 38–126)
Anion gap: 10 (ref 5–15)
BUN: 11 mg/dL (ref 6–20)
CO2: 26 mmol/L (ref 22–32)
Calcium: 8.8 mg/dL — ABNORMAL LOW (ref 8.9–10.3)
Chloride: 103 mmol/L (ref 98–111)
Creatinine: 0.65 mg/dL (ref 0.44–1.00)
GFR, Est AFR Am: >60 mL/min (ref >60)
GFR, Estimated: >60 mL/min (ref >60)
Glucose, Bld: 90 mg/dL (ref 70–99)
Potassium: 3.9 mmol/L (ref 3.5–5.1)
Sodium: 139 mmol/L (ref 135–145)
Total Bilirubin: 0.3 mg/dL (ref 0.3–1.2)
Total Protein: 6.5 g/dL (ref 6.5–8.1)

## 2019-04-13 LAB — LACTATE DEHYDROGENASE: LDH: 209 U/L — ABNORMAL HIGH (ref 98–192)

## 2019-04-14 ENCOUNTER — Telehealth: Payer: Self-pay | Admitting: *Deleted

## 2019-04-14 DIAGNOSIS — R531 Weakness: Secondary | ICD-10-CM | POA: Diagnosis not present

## 2019-04-14 DIAGNOSIS — M25519 Pain in unspecified shoulder: Secondary | ICD-10-CM | POA: Insufficient documentation

## 2019-04-14 DIAGNOSIS — M25511 Pain in right shoulder: Secondary | ICD-10-CM | POA: Diagnosis not present

## 2019-04-14 DIAGNOSIS — Z6833 Body mass index (BMI) 33.0-33.9, adult: Secondary | ICD-10-CM | POA: Insufficient documentation

## 2019-04-14 LAB — KAPPA/LAMBDA LIGHT CHAINS
Kappa free light chain: 10.4 mg/L (ref 3.3–19.4)
Kappa, lambda light chain ratio: 1.18 (ref 0.26–1.65)
Lambda free light chains: 8.8 mg/L (ref 5.7–26.3)

## 2019-04-14 LAB — BETA 2 MICROGLOBULIN, SERUM: Beta-2 Microglobulin: 1.4 mg/L (ref 0.6–2.4)

## 2019-04-14 MED FILL — HYDROCODON-APAP 5-325: 5-325 | 7 days supply | Qty: 20 | Fill #0

## 2019-04-14 NOTE — Telephone Encounter (Signed)
Pt's daughter called stating that they just left Dr. Clotilde Dieter office to go over the MRI results. Per pt, Dr. Roselee Culver feels like the pain is coming from her shoulder and recommended the pt follow-ing up with Dr. Tamala Julian. Pt's daughter asked to schedule an appt to come back in for another steroid injection. The pt's last injection was on 2/11. When can the pt come back in to see Dr. Tamala Julian?

## 2019-04-14 NOTE — Telephone Encounter (Signed)
I am concern more for the potential for multiple myeloma causing the pain. We can get MRI of shoulder to rule out rotator cuff if she would like.

## 2019-04-17 DIAGNOSIS — E785 Hyperlipidemia, unspecified: Secondary | ICD-10-CM | POA: Diagnosis not present

## 2019-04-17 DIAGNOSIS — E039 Hypothyroidism, unspecified: Secondary | ICD-10-CM | POA: Diagnosis not present

## 2019-04-17 DIAGNOSIS — M797 Fibromyalgia: Secondary | ICD-10-CM | POA: Diagnosis not present

## 2019-04-17 DIAGNOSIS — M25511 Pain in right shoulder: Secondary | ICD-10-CM | POA: Diagnosis not present

## 2019-04-17 DIAGNOSIS — Z87891 Personal history of nicotine dependence: Secondary | ICD-10-CM | POA: Diagnosis not present

## 2019-04-17 DIAGNOSIS — G8929 Other chronic pain: Secondary | ICD-10-CM | POA: Diagnosis not present

## 2019-04-17 DIAGNOSIS — Z9884 Bariatric surgery status: Secondary | ICD-10-CM | POA: Diagnosis not present

## 2019-04-17 DIAGNOSIS — G500 Trigeminal neuralgia: Secondary | ICD-10-CM | POA: Diagnosis not present

## 2019-04-17 DIAGNOSIS — I10 Essential (primary) hypertension: Secondary | ICD-10-CM | POA: Diagnosis not present

## 2019-04-17 DIAGNOSIS — M899 Disorder of bone, unspecified: Secondary | ICD-10-CM | POA: Diagnosis not present

## 2019-04-17 DIAGNOSIS — R937 Abnormal findings on diagnostic imaging of other parts of musculoskeletal system: Secondary | ICD-10-CM | POA: Diagnosis not present

## 2019-04-17 LAB — MULTIPLE MYELOMA PANEL, SERUM
Albumin SerPl Elph-Mcnc: 3.7 g/dL (ref 2.9–4.4)
Albumin/Glob SerPl: 1.5 (ref 0.7–1.7)
Alpha 1: 0.2 g/dL (ref 0.0–0.4)
Alpha2 Glob SerPl Elph-Mcnc: 0.7 g/dL (ref 0.4–1.0)
B-Globulin SerPl Elph-Mcnc: 1 g/dL (ref 0.7–1.3)
Gamma Glob SerPl Elph-Mcnc: 0.5 g/dL (ref 0.4–1.8)
Globulin, Total: 2.5 g/dL (ref 2.2–3.9)
IgA: 115 mg/dL (ref 87–352)
IgG (Immunoglobin G), Serum: 572 mg/dL — ABNORMAL LOW (ref 586–1602)
IgM (Immunoglobulin M), Srm: 28 mg/dL (ref 26–217)
Total Protein ELP: 6.2 g/dL (ref 6.0–8.5)

## 2019-04-17 NOTE — Telephone Encounter (Signed)
Patient will fax report and bring CD to office.

## 2019-04-18 LAB — UPEP/UIFE/LIGHT CHAINS/TP, 24-HR UR
% BETA, Urine: 0 %
ALPHA 1 URINE: 0 %
Albumin, U: 100 %
Alpha 2, Urine: 0 %
Free Kappa Lt Chains,Ur: 3.92 mg/L (ref 0.63–113.79)
Free Kappa/Lambda Ratio: >5.6 (ref 1.03–31.76)
Free Lambda Lt Chains,Ur: <0.7 mg/L (ref 0.47–11.77)
GAMMA GLOBULIN URINE: 0 %
Total Protein, Urine-Ur/day: 70 mg/24 hr (ref 30–150)
Total Protein, Urine: 7 mg/dL
Total Volume: 1000

## 2019-04-18 MED FILL — carBAMazepine ER 100 MG TB1: 100 | 30 days supply | Qty: 120 | Fill #4

## 2019-04-18 MED FILL — ESCITALOPRAM 20 MG TABLET: 20 | 80 days supply | Qty: 120 | Fill #3

## 2019-04-18 MED FILL — TOPIRAMATE 50 MG TABLET: 50 | 30 days supply | Qty: 30 | Fill #4

## 2019-04-20 ENCOUNTER — Other Ambulatory Visit: Payer: Self-pay

## 2019-04-20 ENCOUNTER — Ambulatory Visit (HOSPITAL_COMMUNITY)
Admission: RE | Admit: 2019-04-20 | Discharge: 2019-04-20 | Disposition: A | Discharge status: Home / Self Care | Payer: BC Managed Care – PPO | Source: Ambulatory Visit | Attending: Hematology and Oncology | Admitting: Hematology and Oncology

## 2019-04-20 DIAGNOSIS — M899 Disorder of bone, unspecified: Secondary | ICD-10-CM | POA: Diagnosis not present

## 2019-04-20 DIAGNOSIS — M7591 Shoulder lesion, unspecified, right shoulder: Secondary | ICD-10-CM | POA: Diagnosis not present

## 2019-04-21 ENCOUNTER — Encounter: Payer: Self-pay | Admitting: Family Medicine

## 2019-04-21 ENCOUNTER — Telehealth: Payer: Self-pay | Admitting: *Deleted

## 2019-04-21 ENCOUNTER — Ambulatory Visit: Payer: BC Managed Care – PPO | Admitting: Family Medicine

## 2019-04-21 ENCOUNTER — Ambulatory Visit (INDEPENDENT_AMBULATORY_CARE_PROVIDER_SITE_OTHER): Payer: BC Managed Care – PPO

## 2019-04-21 ENCOUNTER — Ambulatory Visit: Payer: Self-pay

## 2019-04-21 VITALS — BP 110/82 | HR 68 | Ht 63.5 in

## 2019-04-21 DIAGNOSIS — G8929 Other chronic pain: Secondary | ICD-10-CM

## 2019-04-21 DIAGNOSIS — M25562 Pain in left knee: Secondary | ICD-10-CM

## 2019-04-21 DIAGNOSIS — S8992XA Unspecified injury of left lower leg, initial encounter: Secondary | ICD-10-CM | POA: Diagnosis not present

## 2019-04-21 DIAGNOSIS — M25511 Pain in right shoulder: Secondary | ICD-10-CM

## 2019-04-21 MED ORDER — PREDNISONE 50 MG PO TABS: 50.0000 mg | ORAL_TABLET | Freq: Every day | ORAL | 0 refills | Status: DC

## 2019-04-21 MED FILL — predniSONE 50 MG TABS: 50 | 5 days supply | Qty: 5 | Fill #0

## 2019-04-21 NOTE — Progress Notes (Signed)
X-ray knee shows no fracture

## 2019-04-21 NOTE — Telephone Encounter (Signed)
TCT patient regarding lab results and bone survey results. Spoke with patient and informed her that her labs and bone survey show no signs of  multiple myeloma or blood disorders. Dr. Lorenso Courier will see her back in 1 year for MD visit and labs, repeat bone survey.  Pt voiced understanding. No questions or concerns.

## 2019-04-21 NOTE — Patient Instructions (Signed)
Thank you for coming in today. Call or go to the ER if you develop a large red swollen joint with extreme pain or oozing puss.  Take the prednisone in a few days if pain is not controlled.  Let us know if you are not doing well.   Use the hinged knee brace.

## 2019-04-21 NOTE — Progress Notes (Signed)
I, Kandace Blitz, LAT, ATC, am serving as scribe for Dr. Lynne Leader.  Kelli Bell is a 59 y.o. female who presents to [place] at [facility] today for L knee pain after suffering a fall yesterday.  She locates her pain to the lateral knee. Right shoulder pain as well. [place]. She rates her pain at a 7/10 and describes her pain as sharp and throbbing. Would like a shoulder injection today.  She notes significant lateral knee pain as well on the left side.  She is limping.  Both pains worsened after fall.  No radiating pain down her arms or legs.  No weakness or numbness distally.  Patient will be traveling to [facility] tomorrow for about a week  Radiating pain: Not beyond elbow or knee. L knee swelling: No  L knee mechanical symptoms: knee painful without clicking or significant giving way. Aggravating factors: walking  Treatments tried: tylenol, voltaren gel    Pertinent review of systems: No fevers or chills  Relevant historical information: Depression   Exam:  BP 110/82 (BP Location: Left Arm, Patient Position: Sitting)   Pulse 68   Ht 5' 3.5" (1.613 m)   SpO2 96%   BMI 31.96 kg/m  General: Well Developed, well nourished, and in no acute distress.   MSK:  Right shoulder normal-appearing Not particular tender to palpation. Range of motion pain with abduction beyond 100 degrees decreased abduction range of motion. Internal rotation limited to lumbar spine.  Normal external rotation. Strength intact to abduction 4/5 external rotation 5/5 internal rotation 5/5. Positive Hawkins and Neer's test.  Left knee: Normal-appearing without significant swelling or erythema. Tender palpation lateral aspect of knee.  Otherwise nontender. Range of motion 0-120 degrees with crepitation. Stable limits exam to anterior posterior drawer testing however some guarding with exam testing. Pain but no significant laxity to LCL stress test.  No laxity or pain with MCL  stress test. Guarding with McMurray testing nondiagnostic. Intact flexion and essential strength.   Lab and Radiology Results:  Diagnostic Limited MSK Ultrasound of: Right shoulder Biceps tendon intact Subscapularis tendon intact without obvious full-thickness tear. Supraspinatus tendon intact no obvious full-thickness tear.  Nonretracted. Moderate subacromial bursa thickness. Infraspinatus tendon intact no obvious tear. AC joint mild effusion Impression: Subacromial bursitis.  No full-thickness retracted rotator cuff tear.  Diagnostic Limited MSK Ultrasound of: Left knee Quad tendon intact Patellar tendon intact. Medial joint line slightly narrowed degenerative without severe tear Lateral joint line: Hypoechoic fluid collecting at lateral femoral condyle.  No obvious bone avulsion or fracture at this area.  LCL does appear to be intact.  Lateral meniscus degenerative. No significant Baker's cyst. Impression: Lateral knee injury  Procedure: Real-time Ultrasound Guided Injection of right shoulder subacromial bursa Device: Philips Affiniti 50G Images permanently stored and available for review in the ultrasound unit. Verbal informed consent obtained.  Discussed risks and benefits of procedure. Warned about infection bleeding damage to structures skin hypopigmentation and fat atrophy among others. Patient expresses understanding and agreement Time-out conducted.   Noted no overlying erythema, induration, or other signs of local infection.   Skin prepped in a sterile fashion.   Local anesthesia: Topical Ethyl chloride.   With sterile technique and under real time ultrasound guidance:  40 mg of Kenalog and 2 mL of Marcaine injected easily.   Completed without difficulty   Pain immediately resolved suggesting accurate placement of the medication.   Advised to call if fevers/chills, erythema, induration, drainage, or persistent bleeding.  Images permanently stored and available for  review in the ultrasound unit.  Impression: Technically successful ultrasound guided injection.    X-ray images left knee obtained today personally independently review Moderate degenerative changes.  No acute fracture.  Slight bony irregularity at lateral femoral condyle without obvious avulsion fracture.  This is seen on only one view and cannot be correlated to other views. Await formal radiology review    No results found for this or any previous visit (from the past 72 hour(s)). DG Bone Survey Met  Result Date: 04/20/2019 CLINICAL DATA:  Known lytic lesions in the proximal right humerus on prior shoulder x-ray EXAM: METASTATIC BONE SURVEY COMPARISON:  03/30/2019 FINDINGS: Metastatic bone survey was performed. Lateral view of the skull demonstrates postoperative changes although no lytic lesions are seen. Upper extremities show no acute fracture or dislocation. Previously seen lucencies in the proximal right humerus are less well appreciated on today's exam although this may be positional in nature. No other lytic lesions are seen in the upper extremities Cervical spine demonstrates degenerative change without focal lytic or sclerotic lesions. The thoracic spine demonstrates degenerative change without compression deformity. No lytic or sclerotic lesions are seen. Lumbar spine also demonstrates degenerative change without focal lytic or sclerotic lesion. Ribcage shows no lytic or sclerotic lesions. The lungs are clear bilaterally. Frontal view of the pelvis and lower extremities shows no significant lytic or sclerotic lesions. No soft tissue abnormality is seen. IMPRESSION: No definitive lytic or sclerotic lesions are identified. The previously seen lucencies in the proximal right humerus are not as well appreciated on today's exam. This may be projectional in nature. Correlate with laboratory values. Electronically Signed   By: Inez Catalina M.D.   On: 04/20/2019 10:07   EXAM: MRI OF THE RIGHT  SHOULDER WITHOUT CONTRAST  TECHNIQUE: Multiplanar, multisequence MR imaging of the shoulder was performed. No intravenous contrast was administered.  COMPARISON: Right shoulder x-rays dated March 30, 2019.  FINDINGS: Rotator cuff: Mild supraspinatus tendinosis with small focal high-grade bursal surface tear anteriorly at the insertion. Moderate infraspinatus tendinosis. Mild subscapularis tendinosis. The teres minor tendon is unremarkable.  Muscles: No atrophy or abnormal signal of the muscles of the rotator cuff.  Biceps long head: Intact and normally positioned.  Acromioclavicular Joint: Moderate arthropathy of the acromioclavicular joint. Type I acromion. Trace fluid in the subacromial/subdeltoid bursa.  Glenohumeral Joint: No joint effusion. Cartilage thinning over the humeral head without focal defect.  Labrum: Grossly intact, but evaluation is limited by lack of intraarticular fluid.  Bones: No marrow abnormality, fracture or dislocation.  Other: None.  IMPRESSION: 1. Mild supraspinatus tendinosis with small focal high-grade bursal surface tear anteriorly at the insertion. 2. Moderate infraspinatus and mild subscapularis tendinosis. 3. Moderate acromioclavicular and mild glenohumeral osteoarthritis.   Electronically Signed By: Titus Dubin M.D. On: 04/13/2019 07:51  I, Lynne Leader, personally (independently) visualized and performed the interpretation of the images attached in this note.      Assessment and Plan: 59 y.o. female with right shoulder pain and left knee pain occurring after fall.  Patient has evidence of rotator cuff tendinopathy with possible partial-thickness tear of supraspinatus tendon seen on prior MRI.  She has new injury today.  Fortunately she does not have a full-thickness retracted rotator cuff tear.  Plan for injection today for shoulder pain.  Recheck back as needed near future.  Left knee pain after fall.  No obvious severe  injury on ultrasound x-ray or physical exam.  Plan for hinged  knee brace Voltaren gel.  As she is traveling to [place] [facility] starting tomorrow I have prescribed a course of prednisone.  She is not supposed to take this now but could take it in the future if worsening or having difficulty with pain.  Additionally she can contact our office next week if she is having problems.  Recommend recheck with Dr. Tamala Julian or myself at some point in the near future.   PDMP not reviewed this encounter. Orders Placed This Encounter  Procedures  . DG Knee AP/LAT W/[place] Left    Standing Status:   Future    Number of Occurrences:   1    Standing Expiration Date:   06/20/2020    Order Specific Question:   Reason for Exam (SYMPTOM  OR DIAGNOSIS REQUIRED)    Answer:   L knee pain    Order Specific Question:   Is patient pregnant?    Answer:   No    Order Specific Question:   Preferred imaging location?    Answer:   [facility]  . [place] LIMITED JOINT SPACE STRUCTURES UP RIGHT(NO LINKED CHARGES)    Standing Status:   Future    Number of Occurrences:   1    Standing Expiration Date:   10/22/2019    Order Specific Question:   Reason for Exam (SYMPTOM  OR DIAGNOSIS REQUIRED)    Answer:   right shoulder pain    Order Specific Question:   Preferred imaging location?    Answer:   [facility]  . [place] LIMITED JOINT SPACE STRUCTURES UP RIGHT(NO LINKED CHARGES)    Order Specific Question:   Reason for Exam (SYMPTOM  OR DIAGNOSIS REQUIRED)    Answer:   eval shoudler    Order Specific Question:   Preferred imaging location?    Answer:   [place]   Meds ordered this encounter  Medications  . predniSONE (DELTASONE) 50 MG tablet    Sig: Take 1 tablet (50 mg total) by mouth daily.    Dispense:  5 tablet    Refill:  0     Discussed warning signs or symptoms. Please see discharge instructions. Patient expresses understanding.   The above documentation has been reviewed and is accurate  and complete Lynne Leader

## 2019-04-21 NOTE — Telephone Encounter (Signed)
-----  Message from Ledell Peoples IV, MD sent at 04/20/2019 10:45 AM EDT ----- Please call Kelli Bell to inform her that:  1) her labs show NO evidence of multiple myeloma or blood disorders 2) her bone survey showed NO other bone lesions, with the exception of the ones we saw on the X-ray of the shoulder.  These findings are reassuring. At this time would recommend a f/u in our clinic in 1 year with repeat bone survey and labs. ##Please send a scheduling request for a return visit in March 2022 with labs before the visit##.  Kelli Bell  ----- Message ----- From: Kelli Bell, Lab In [place] Sent: 04/13/2019   9:52 AM EDT To: Kelli Slick, MD

## 2019-04-24 ENCOUNTER — Telehealth: Payer: Self-pay | Admitting: Hematology and Oncology

## 2019-04-24 NOTE — Telephone Encounter (Signed)
Scheduled appt per 3/26 sch message - mailed letter with appt date and time

## 2019-05-02 ENCOUNTER — Encounter: Payer: Self-pay | Admitting: Family Medicine

## 2019-05-02 ENCOUNTER — Other Ambulatory Visit: Payer: Self-pay

## 2019-05-02 ENCOUNTER — Ambulatory Visit: Payer: Self-pay

## 2019-05-02 ENCOUNTER — Ambulatory Visit: Payer: BC Managed Care – PPO | Admitting: Family Medicine

## 2019-05-02 VITALS — BP 116/82 | HR 72 | Ht 63.0 in | Wt 184.0 lb

## 2019-05-02 DIAGNOSIS — L821 Other seborrheic keratosis: Secondary | ICD-10-CM | POA: Diagnosis not present

## 2019-05-02 DIAGNOSIS — M19019 Primary osteoarthritis, unspecified shoulder: Secondary | ICD-10-CM | POA: Insufficient documentation

## 2019-05-02 DIAGNOSIS — M25562 Pain in left knee: Secondary | ICD-10-CM

## 2019-05-02 DIAGNOSIS — M19011 Primary osteoarthritis, right shoulder: Secondary | ICD-10-CM

## 2019-05-02 DIAGNOSIS — L639 Alopecia areata, unspecified: Secondary | ICD-10-CM | POA: Diagnosis not present

## 2019-05-02 NOTE — Patient Instructions (Signed)
Good to see you  Ice 20 minutes 2 times daily. Usually after activity and before bed. Exercises 3 times a week.  Give the injections 2 days to work, if not better then take the prednisone  Then in 1 week if not better give [place] a call and we will get a nerve conduction study to see if anything else is going on  Wear brace on knee daily for 2 weeks then with increase activity for 4 weeks  Otherwise see me again in 7-9 weeks

## 2019-05-02 NOTE — Progress Notes (Signed)
[place] [facility] Phone: [phone] Subjective:   Kelli Bell, am serving as a scribe for Dr. Hulan Saas. This visit occurred during the SARS-CoV-2 public health emergency.  Safety protocols were in place, including screening questions prior to the visit, additional usage of staff PPE, and extensive cleaning of exam room while observing appropriate contact time as indicated for disinfecting solutions.    I'm seeing this patient by the request  of:  Biagio Borg, MD  CC: right shoulder left knee pain   [id number]   03/30/2019 Patient had injections previously.  Bell significant improvement.  Concerned that the calcific changes that were noted previously could trying to heal a rotator cuff tear and I do feel advanced imaging is warranted at this time.  Has done formal physical therapy with minimal improvement at the moment.  If anything worsening.  Update 05/02/2019 Kelli Bell is a 59 y.o. female coming in with complaint of right shoulder pain. Patient states that she did hurt her shoulder when she fell on 03/29/2019. Did have 5 days of relief from injection. Anterior aspect of shoulder.   Patient fell and had valgus force to knee as foot slipped out from under her. Pain over LCL.  Wearing brace, is getting better but slow.     Past Medical History:  Diagnosis Date  . ANXIETY 03/30/2007  . BACK PAIN 02/20/2009  . Essential hypertension, benign 05/31/2012  . Family history of anesthesia complication    "mother has hard time waking up"  . GERD 01/13/2007  . H/O hiatal hernia   . HYPERLIPIDEMIA 02/22/2008  . HYPOTHYROIDISM 03/30/2007  . MENOPAUSAL DISORDER 02/23/2008  . Nocturia   . OBSTRUCTIVE SLEEP APNEA 06/29/2007   has not used c pap x 8 months due to wt loss  . Osteoarthritis   . SINUSITIS- ACUTE-NOS 03/30/2007  . THORACIC/LUMBOSACRAL NEURITIS/RADICULITIS UNSPEC 02/23/2008  . Wheezing 01/08/2009   Past Surgical  History:  Procedure Laterality Date  . ABDOMINAL HYSTERECTOMY  2004   with BSO  . BREATH TEK H PYLORI N/A 05/05/2013   Procedure: BREATH TEK H PYLORI;  Surgeon: Shann Medal, MD;  Location: [facility];  Service: General;  Laterality: N/A;  . CHOLECYSTECTOMY  1980  . GASTRIC ROUX-EN-Y N/A 09/12/2013   Procedure: LAPAROSCOPIC ROUX-EN-Y GASTRIC BYPASS WITH UPPER ENDOSCOPY WITH ENTEROLYSIS OF ADHESIONS;  Surgeon: Shann Medal, MD;  Location: [facility];  Service: General;  Laterality: N/A;  . jaw bone surgury     58 yo  . TONSILLECTOMY     Social History   Socioeconomic History  . Marital status: Widowed    Spouse name: Not on file  . Number of children: Not on file  . Years of education: Not on file  . Highest education level: Not on file  Occupational History  . Not on file  Tobacco Use  . Smoking status: Former Smoker    Quit date: 07/06/1997    Years since quitting: 21.8  . Smokeless tobacco: Never Used  Substance and Sexual Activity  . Alcohol use: Yes    Comment: [place]  . Drug use: Bell  . Sexual activity: Not Currently    Birth control/protection: Surgical    Comment: intercourse age 68, less than 5 sexual partners,des neg  Other Topics Concern  . Not on file  Social History Narrative  . Not on file   Social Determinants of Health   Financial Resource Strain:   .  Difficulty of Paying Living Expenses:   Food Insecurity:   . Worried About Charity fundraiser in the Last Year:   . Arboriculturist in the Last Year:   Transportation Needs:   . Film/video editor (Medical):   Marland Kitchen Lack of Transportation (Non-Medical):   Physical Activity:   . Days of Exercise per Week:   . Minutes of Exercise per Session:   Stress:   . Feeling of Stress :   Social Connections:   . Frequency of Communication with Friends and Family:   . Frequency of Social Gatherings with Friends and Family:   . Attends Religious Services:   . Active Member of Clubs or  Organizations:   . Attends Archivist Meetings:   Marland Kitchen Marital Status:    Allergies  Allergen Reactions  . Codeine Nausea Only   Family History  Problem Relation Age of Onset  . Stroke Mother   . Hypertension Mother   . Diabetes Mother   . Cancer Mother        basel cell cancer  . Thyroid disease Mother   . Arthritis Other   . Colon polyps Father 63       "CANCEROUS POLYPS REMOVED THIS YEAR"  . Asthma Brother   . Thyroid disease Maternal Grandmother   . Lymphoma Maternal Grandmother   . Osteoporosis Other   . Breast cancer Maternal Aunt        mat great aunt  . Diabetes Paternal Grandmother   . Heart disease Paternal Grandmother   . Colon cancer Neg Hx     Current Outpatient Medications (Endocrine & Metabolic):  .  levothyroxine (SYNTHROID) 88 MCG tablet, TAKE 1 TABLET BY MOUTH EVERY MORNING. .  predniSONE (DELTASONE) 50 MG tablet, Take 1 tablet (50 mg total) by mouth daily.    Current Outpatient Medications (Analgesics):  .  acetaminophen (TYLENOL) 325 MG tablet, Take 650 mg by mouth every 6 (six) hours as needed for mild pain or headache. .  allopurinol (ZYLOPRIM) 100 MG tablet, Take 2 tablets (200 mg total) by mouth daily. .  traMADol (ULTRAM) 50 MG tablet, Take 1 tablet (50 mg total) by mouth every 6 (six) hours as needed.  Current Outpatient Medications (Hematological):  Marland Kitchen  Cyanocobalamin (B-12 PO), Take 5 mLs by mouth as needed (low b12).  Current Outpatient Medications (Other):  Marland Kitchen  CALCIUM PO, Take 5 mLs by mouth daily. .  carBAMazepine (TEGRETOL PO), Take 100 mg by mouth 2 (two) times daily. .  clobetasol (TEMOVATE) 0.05 % external solution,  .  Diclofenac Sodium (PENNSAID) 2 % SOLN, Place 1 application onto the skin 2 (two) times daily. .  diclofenac Sodium (VOLTAREN) 1 % GEL, Apply 2 g topically 4 (four) times daily. As needed for right shoulder .  DULoxetine (CYMBALTA) 60 MG capsule, Take 1 capsule (60 mg total) by mouth daily. Marland Kitchen  gabapentin  (NEURONTIN) 100 MG capsule, Take 2 capsules (200 mg total) by mouth at bedtime. .  Multiple Vitamins-Minerals (MULTIVITAMIN ADULT PO), Take 1 tablet by mouth daily. Marland Kitchen  omeprazole (PRILOSEC) 20 MG capsule, TAKE 2 CAPSULES BY MOUTH DAILY. Marland Kitchen  topiramate (TOPAMAX) 50 MG tablet, Take 1 tablet by mouth daily.   Reviewed prior external information including notes and imaging from  primary care provider As well as notes that were available from care everywhere and other healthcare systems.  Past medical history, social, surgical and family history all reviewed in electronic medical record.  Bell pertanent information  unless stated regarding to the chief complaint.   Review of Systems:  Bell headache, visual changes, nausea, vomiting, diarrhea, constipation, dizziness, abdominal pain, skin rash, fevers, chills, night sweats, weight loss, swollen lymph nodes,  joint swelling, chest pain, shortness of breath, mood changes. POSITIVE muscle aches, body aches.   Objective  Blood pressure 116/82, pulse 72, height 5\' 3"  (1.6 m), weight 184 lb (83.5 kg), SpO2 96 %.   General: Bell apparent distress alert and oriented x3 mood and affect normal, dressed appropriately.  HEENT: Pupils equal, extraocular movements intact  Respiratory: Patient's speak in full sentences and does not appear short of breath  Cardiovascular: Bell lower extremity edema, non tender, Bell erythema  Neuro: Cranial nerves II through XII are intact, neurovascularly intact in all extremities with 2+ DTRs and 2+ pulses.  Gait normal with good balance and coordination.  MSK:  tender with limited range of motion and pain out of proportion  Right shoulder exam still shows diffuse tenderness.  Patient does have positive impingement but worsening pain over the acromioclavicular joint than previous exam.  Positive crossover.  Mild pain with O'Brien's. Left knee shows some mild tenderness over the lateral line.  Patient does have trace effusion of the  patellofemoral joint.  Positive lateral tracking of the patella noted with positive patellar grind.  ACL appears to be intact  Procedure: Real-time Ultrasound Guided Injection of right acromioclavicular joint Device: GE Logiq Q7 Ultrasound guided injection is preferred based studies that show increased duration, increased effect, greater accuracy, decreased procedural pain, increased response rate, and decreased cost with ultrasound guided versus blind injection.  Verbal informed consent obtained.  Time-out conducted.  Noted Bell overlying erythema, induration, or other signs of local infection.  Skin prepped in a sterile fashion.  Local anesthesia: Topical Ethyl chloride.  With sterile technique and under real time ultrasound guidance:   With a 25-gauge half inch needle injected with 0.5 cc of 0.5% Marcaine and 0.5 cc of Kenalog 40 mg/mL Completed without difficulty  Pain immediately resolved suggesting accurate placement of the medication.  Advised to call if fevers/chills, erythema, induration, drainage, or persistent bleeding.  Images permanently stored and available for review in the ultrasound unit.  Impression: Technically successful ultrasound guided injection.  Limited musculoskeletal ultrasound of patient's left knee shows the patient does have trace effusion noted of the knee.  LCL appears to be intact.  Mild osteoarthritic changes of all 3 compartments.  Bell true meniscal tear appreciated    Impression and Recommendations:     This case required medical decision making of moderate complexity. The above documentation has been reviewed and is accurate and complete Lyndal Pulley, DO       Note: This dictation was prepared with Dragon dictation along with smaller phrase technology. Any transcriptional errors that result from this process are unintentional.

## 2019-05-02 NOTE — Assessment & Plan Note (Signed)
Patient given injection today, tolerated the procedure well, discussed icing regimen and home exercise, which activities to do which wants to avoid.  Increase activity slowly over the course the next several days.  Hoping that this will be beneficial with patient's MRI only showing some tendinitis otherwise.  Patient continues to have following that is contributing concerning.  Discussed that this is wrong differential includes a cervical radiculopathy given nerve conduction studies could be beneficial.  Follow-up again in 4 to 8 weeks

## 2019-05-02 NOTE — Assessment & Plan Note (Signed)
Left knee seems to be more of a instability of the LCL but on ultrasound no significant tearing appreciated.  Patient does have a trace effusion underneath the patellofemoral and does have moderate narrowing of the patellofemoral joint.  Patient will continue with the brace for the next 2 weeks and in 4 weeks with increasing activity.  Continue to have problems at the end of that regimen and will need to consider advanced imaging.  Patient is in agreement with the plan.  Home exercises given, discussed medications that have been prescribed such as the topical anti-inflammatories

## 2019-05-04 ENCOUNTER — Encounter: Payer: BC Managed Care – PPO | Admitting: Physical Therapy

## 2019-05-05 ENCOUNTER — Ambulatory Visit: Payer: BC Managed Care – PPO | Attending: Internal Medicine

## 2019-05-05 DIAGNOSIS — Z23 Encounter for immunization: Secondary | ICD-10-CM

## 2019-05-05 NOTE — Progress Notes (Signed)
   Covid-19 Vaccination Clinic  Name:  Sharada Schiltz    MRN: [id number] DOB: [birth year]  05/05/2019  Ms. Islas was observed post Covid-19 immunization for 15 minutes without incident. She was provided with Vaccine Information Sheet and instruction to access the V-Safe system.   Ms. Coste was instructed to call 911 with any severe reactions post vaccine: Marland Kitchen Difficulty breathing  . Swelling of face and throat  . A fast heartbeat  . A bad rash all over body  . Dizziness and weakness   Immunizations Administered    Name Date Dose VIS Date Route   Pfizer COVID-19 Vaccine 05/05/2019 10:02 AM 0.3 mL 01/06/2019 Intramuscular   Manufacturer: [employer]   Lot: [id number]   [place]: [id number]

## 2019-05-09 ENCOUNTER — Encounter: Payer: BC Managed Care – PPO | Admitting: Physical Therapy

## 2019-05-15 MED FILL — AMOX-CLAV 875-125 MG TABLET: 875-125 | 10 days supply | Qty: 20 | Fill #0

## 2019-05-29 ENCOUNTER — Ambulatory Visit: Payer: BC Managed Care – PPO | Attending: Internal Medicine

## 2019-05-29 DIAGNOSIS — Z23 Encounter for immunization: Secondary | ICD-10-CM

## 2019-05-29 NOTE — Progress Notes (Signed)
   Covid-19 Vaccination Clinic  Name:  Shaima Sajous    MRN: [id number] DOB: [birth year]  05/29/2019  Ms. Savoy was observed post Covid-19 immunization for 15 minutes without incident. She was provided with Vaccine Information Sheet and instruction to access the V-Safe system.   Ms. Newborn was instructed to call 911 with any severe reactions post vaccine: Marland Kitchen Difficulty breathing  . Swelling of face and throat  . A fast heartbeat  . A bad rash all over body  . Dizziness and weakness   Immunizations Administered    Name Date Dose VIS Date Route   Pfizer COVID-19 Vaccine 05/29/2019 11:33 AM 0.3 mL 03/22/2018 Intramuscular   Manufacturer: [place]   Lot: [id number]   [place]: [id number]

## 2019-05-30 DIAGNOSIS — L639 Alopecia areata, unspecified: Secondary | ICD-10-CM | POA: Diagnosis not present

## 2019-06-20 ENCOUNTER — Other Ambulatory Visit: Payer: Self-pay

## 2019-06-20 ENCOUNTER — Ambulatory Visit: Payer: BC Managed Care – PPO | Admitting: Family Medicine

## 2019-06-20 ENCOUNTER — Ambulatory Visit (INDEPENDENT_AMBULATORY_CARE_PROVIDER_SITE_OTHER): Payer: BC Managed Care – PPO

## 2019-06-20 ENCOUNTER — Encounter: Payer: Self-pay | Admitting: Family Medicine

## 2019-06-20 VITALS — BP 116/70 | HR 64 | Ht 63.0 in | Wt 186.0 lb

## 2019-06-20 DIAGNOSIS — M79672 Pain in left foot: Secondary | ICD-10-CM | POA: Diagnosis not present

## 2019-06-20 DIAGNOSIS — M7530 Calcific tendinitis of unspecified shoulder: Secondary | ICD-10-CM

## 2019-06-20 NOTE — Assessment & Plan Note (Signed)
Left foot pain.  Seems to be on the plantar aspect.  Ultrasound showing a potential for a compression on a varicose vein in the area that is contributing.  Patient though does have full compression of the vein and I do not think there is any likelihood of a blood clot.  Patient will watch for though increasing calf pain and then will seek medical attention immediately.  Discussed this with her as well as her daughter.  Cam walker put on today and alleviated 95% of the pain almost immediately.  Discussed which activities to doing which wants to avoid.  Patient will do more about icing regimen.  Mild range of motion exercises.  Follow-up again in 4 to 8 weeks

## 2019-06-20 NOTE — Patient Instructions (Signed)
Good to see you Varicose vain at the bottom of the foot  Elevation, ice for the foot See me again in 3-4 weeks

## 2019-06-20 NOTE — Assessment & Plan Note (Signed)
Likely the pseudogout is playing more of a role other than tendinitis. Could potentially consider injection at follow-up. Follow-up again in 4 to 6 weeks

## 2019-06-20 NOTE — Progress Notes (Signed)
[place] [address] [place] Phone: [phone] Subjective:   I Kelli Bell am serving as a Education administrator for Dr. Hulan Saas.  This visit occurred during the SARS-CoV-2 public health emergency.  Safety protocols were in place, including screening questions prior to the visit, additional usage of staff PPE, and extensive cleaning of exam room while observing appropriate contact time as indicated for disinfecting solutions.   I'm seeing this patient by the request  of:  Biagio Borg, MD  CC: Shoulder pain follow-up, foot pain new, knee pain follow-up [id number]   05/02/2019 Left knee seems to be more of a instability of the LCL but on ultrasound no significant tearing appreciated.  Patient does have a trace effusion underneath the patellofemoral and does have moderate narrowing of the patellofemoral joint.  Patient will continue with the brace for the next 2 weeks and in 4 weeks with increasing activity.  Continue to have problems at the end of that regimen and will need to consider advanced imaging.  Patient is in agreement with the plan.  Home exercises given, discussed medications that have been prescribed such as the topical anti-inflammatories  Patient given injection today, tolerated the procedure well, discussed icing regimen and home exercise, which activities to do which wants to avoid.  Increase activity slowly over the course the next several days.  Hoping that this will be beneficial with patient's MRI only showing some tendinitis otherwise.  Patient continues to have following that is contributing concerning.  Discussed that this is wrong differential includes a cervical radiculopathy given nerve conduction studies could be beneficial.  Follow-up again in 4 to 8 weeks  Update 06/20/2019 Kelli Bell is a 59 y.o. female coming in with complaint of left knee and right shoulder pain. Patient states the knee is doing well. Shoulder is  still painful at times. Left heel pain. States the pain radiates to her knee. Pain started last Thursday afternoon. Patient is limping and has tried staying off the foot. Pain is not all the time. Calf is sore due to walking on her toes.       Past Medical History:  Diagnosis Date  . ANXIETY 03/30/2007  . BACK PAIN 02/20/2009  . Essential hypertension, benign 05/31/2012  . Family history of anesthesia complication    "mother has hard time waking up"  . GERD 01/13/2007  . H/O hiatal hernia   . HYPERLIPIDEMIA 02/22/2008  . HYPOTHYROIDISM 03/30/2007  . MENOPAUSAL DISORDER 02/23/2008  . Nocturia   . OBSTRUCTIVE SLEEP APNEA 06/29/2007   has not used c pap x 8 months due to wt loss  . Osteoarthritis   . SINUSITIS- ACUTE-NOS 03/30/2007  . THORACIC/LUMBOSACRAL NEURITIS/RADICULITIS UNSPEC 02/23/2008  . Wheezing 01/08/2009   Past Surgical History:  Procedure Laterality Date  . ABDOMINAL HYSTERECTOMY  2004   with BSO  . BREATH TEK H PYLORI N/A 05/05/2013   Procedure: BREATH TEK H PYLORI;  Surgeon: Shann Medal, MD;  Location: [facility];  Service: General;  Laterality: N/A;  . CHOLECYSTECTOMY  1980  . GASTRIC ROUX-EN-Y N/A 09/12/2013   Procedure: LAPAROSCOPIC ROUX-EN-Y GASTRIC BYPASS WITH UPPER ENDOSCOPY WITH ENTEROLYSIS OF ADHESIONS;  Surgeon: Shann Medal, MD;  Location: [facility];  Service: General;  Laterality: N/A;  . jaw bone surgury     59 yo  . TONSILLECTOMY     Social History   Socioeconomic History  . Marital status: Widowed    Spouse name: Not on file  .  Number of children: Not on file  . Years of education: Not on file  . Highest education level: Not on file  Occupational History  . Not on file  Tobacco Use  . Smoking status: Former Smoker    Quit date: 07/06/1997    Years since quitting: 21.9  . Smokeless tobacco: Never Used  Substance and Sexual Activity  . Alcohol use: Yes    Comment: [place]  . Drug use: No  . Sexual activity: Not Currently     Birth control/protection: Surgical    Comment: intercourse age 4, less than 5 sexual partners,des neg  Other Topics Concern  . Not on file  Social History Narrative  . Not on file   Social Determinants of Health   Financial Resource Strain:   . Difficulty of Paying Living Expenses:   Food Insecurity:   . Worried About Charity fundraiser in the Last Year:   . Arboriculturist in the Last Year:   Transportation Needs:   . Film/video editor (Medical):   Marland Kitchen Lack of Transportation (Non-Medical):   Physical Activity:   . Days of Exercise per Week:   . Minutes of Exercise per Session:   Stress:   . Feeling of Stress :   Social Connections:   . Frequency of Communication with Friends and Family:   . Frequency of Social Gatherings with Friends and Family:   . Attends Religious Services:   . Active Member of Clubs or Organizations:   . Attends Archivist Meetings:   Marland Kitchen Marital Status:    Allergies  Allergen Reactions  . Codeine Nausea Only   Family History  Problem Relation Age of Onset  . Stroke Mother   . Hypertension Mother   . Diabetes Mother   . Cancer Mother        basel cell cancer  . Thyroid disease Mother   . Arthritis Other   . Colon polyps Father 31       "CANCEROUS POLYPS REMOVED THIS YEAR"  . Asthma Brother   . Thyroid disease Maternal Grandmother   . Lymphoma Maternal Grandmother   . Osteoporosis Other   . Breast cancer Maternal Aunt        mat great aunt  . Diabetes Paternal Grandmother   . Heart disease Paternal Grandmother   . Colon cancer Neg Hx     Current Outpatient Medications (Endocrine & Metabolic):  .  levothyroxine (SYNTHROID) 88 MCG tablet, TAKE 1 TABLET BY MOUTH EVERY MORNING.    Current Outpatient Medications (Analgesics):  .  acetaminophen (TYLENOL) 325 MG tablet, Take 650 mg by mouth every 6 (six) hours as needed for mild pain or headache. .  allopurinol (ZYLOPRIM) 100 MG tablet, Take 2 tablets (200 mg total) by mouth  daily. .  traMADol (ULTRAM) 50 MG tablet, Take 1 tablet (50 mg total) by mouth every 6 (six) hours as needed.  Current Outpatient Medications (Hematological):  Marland Kitchen  Cyanocobalamin (B-12 PO), Take 5 mLs by mouth as needed (low b12).  Current Outpatient Medications (Other):  Marland Kitchen  CALCIUM PO, Take 5 mLs by mouth daily. .  carBAMazepine (TEGRETOL PO), Take 100 mg by mouth 2 (two) times daily. .  clobetasol (TEMOVATE) 0.05 % external solution,  .  Diclofenac Sodium (PENNSAID) 2 % SOLN, Place 1 application onto the skin 2 (two) times daily. .  diclofenac Sodium (VOLTAREN) 1 % GEL, Apply 2 g topically 4 (four) times daily. As needed for right  shoulder .  DULoxetine (CYMBALTA) 60 MG capsule, Take 1 capsule (60 mg total) by mouth daily. Marland Kitchen  gabapentin (NEURONTIN) 100 MG capsule, Take 2 capsules (200 mg total) by mouth at bedtime. .  Multiple Vitamins-Minerals (MULTIVITAMIN ADULT PO), Take 1 tablet by mouth daily. Marland Kitchen  omeprazole (PRILOSEC) 20 MG capsule, TAKE 2 CAPSULES BY MOUTH DAILY. Marland Kitchen  topiramate (TOPAMAX) 50 MG tablet, Take 1 tablet by mouth daily.   Reviewed prior external information including notes and imaging from  primary care provider As well as notes that were available from care everywhere and other healthcare systems.  Past medical history, social, surgical and family history all reviewed in electronic medical record.  No pertanent information unless stated regarding to the chief complaint.   Review of Systems:  No headache, visual changes, nausea, vomiting, diarrhea, constipation, dizziness, abdominal pain, skin rash, fevers, chills, night sweats, weight loss, swollen lymph nodes, body aches, joint swelling, chest pain, shortness of breath, mood changes. POSITIVE muscle aches  Objective  Blood pressure 116/70, pulse 64, height 5\' 3"  (1.6 m), weight 186 lb (84.4 kg), SpO2 97 %.   General: Appears uncomfortable HEENT: Pupils equal, extraocular movements intact  Respiratory: Patient's  speak in full sentences and does not appear short of breath   Gait antalgic walking with the aid of a cane Right shoulder still shows some mild positive impingement with [place] but otherwise fairly unremarkable with 5 out of 5 strength.  Left knee exam also minimally tender over the lateral joint line but otherwise fairly unremarkable.  Left heel severely tender to even light palpation. Patient ankle does have some mild tightness of the posterior cord but very minimal overall. Deep tendon reflexes are intact. No pain over the Achilles. Negative Thompson  Limited musculoskeletal ultrasound was performed and interpreted by Lyndal Pulley  Limited ultrasound shows that patient does have hypoechoic changes near the calcaneal area. No cortical irregularity. Seems to have a varicose vein in the area that is severely tender. Fully compressible but is very tender in the area. Possible hypoechoic changes within the fat pad noted.     Impression and Recommendations:    Patient was measured and fitted for an off-the-shelf brace. Adjustments were made to brace to ensure proper fit.   This case required medical decision making of moderate complexity. The above documentation has been reviewed and is accurate and complete Lyndal Pulley, DO       Note: This dictation was prepared with Dragon dictation along with smaller phrase technology. Any transcriptional errors that result from this process are unintentional.

## 2019-07-03 ENCOUNTER — Encounter: Payer: Self-pay | Admitting: Family Medicine

## 2019-07-04 ENCOUNTER — Other Ambulatory Visit: Payer: Self-pay | Admitting: Family Medicine

## 2019-07-04 NOTE — Telephone Encounter (Signed)
Discussed with pt daughter, OV notes from March-May faxed as continuity of care for referral purposes.

## 2019-07-05 DIAGNOSIS — M4180 Other forms of scoliosis, site unspecified: Secondary | ICD-10-CM | POA: Diagnosis not present

## 2019-07-05 DIAGNOSIS — M4316 Spondylolisthesis, lumbar region: Secondary | ICD-10-CM | POA: Diagnosis not present

## 2019-07-10 DIAGNOSIS — M5416 Radiculopathy, lumbar region: Secondary | ICD-10-CM | POA: Diagnosis not present

## 2019-07-10 DIAGNOSIS — M4316 Spondylolisthesis, lumbar region: Secondary | ICD-10-CM | POA: Diagnosis not present

## 2019-07-10 DIAGNOSIS — M5116 Intervertebral disc disorders with radiculopathy, lumbar region: Secondary | ICD-10-CM | POA: Diagnosis not present

## 2019-07-11 DIAGNOSIS — L639 Alopecia areata, unspecified: Secondary | ICD-10-CM | POA: Diagnosis not present

## 2019-07-13 ENCOUNTER — Ambulatory Visit: Payer: BC Managed Care – PPO | Admitting: Family Medicine

## 2019-07-13 ENCOUNTER — Encounter: Payer: Self-pay | Admitting: Family Medicine

## 2019-07-13 ENCOUNTER — Other Ambulatory Visit: Payer: Self-pay

## 2019-07-13 VITALS — BP 130/78 | HR 47 | Ht 63.0 in | Wt 191.0 lb

## 2019-07-13 DIAGNOSIS — M1120 Other chondrocalcinosis, unspecified site: Secondary | ICD-10-CM

## 2019-07-13 DIAGNOSIS — M19011 Primary osteoarthritis, right shoulder: Secondary | ICD-10-CM

## 2019-07-13 DIAGNOSIS — M2550 Pain in unspecified joint: Secondary | ICD-10-CM

## 2019-07-13 DIAGNOSIS — M7530 Calcific tendinitis of unspecified shoulder: Secondary | ICD-10-CM | POA: Diagnosis not present

## 2019-07-13 NOTE — Progress Notes (Signed)
[place] [address] [place] Phone: [phone] Subjective:   I Kandace Blitz am serving as a Education administrator for Dr. Hulan Saas.  This visit occurred during the SARS-CoV-2 public health emergency.  Safety protocols were in place, including screening questions prior to the visit, additional usage of staff PPE, and extensive cleaning of exam room while observing appropriate contact time as indicated for disinfecting solutions.   I'm seeing this patient by the request  of:  Biagio Borg, MD  CC: Right shoulder pain follow-up  [id number]   06/20/2019 Likely the pseudogout is playing more of a role other than tendinitis. Could potentially consider injection at follow-up. Follow-up again in 4 to 6 weeks  Left foot pain.  Seems to be on the plantar aspect.  Ultrasound showing a potential for a compression on a varicose vein in the area that is contributing.  Patient though does have full compression of the vein and I do not think there is any likelihood of a blood clot.  Patient will watch for though increasing calf pain and then will seek medical attention immediately.  Discussed this with her as well as her daughter.  Cam walker put on today and alleviated 95% of the pain almost immediately.  Discussed which activities to doing which wants to avoid.  Patient will do more about icing regimen.  Mild range of motion exercises.  Follow-up again in 4 to 8 weeks  Update 07/13/2019 Charrise Lardner is a 59 y.o. female coming in with complaint of left foot and right shoulder pain. Patient states she has had a shoe on for 2 days. Stopped wearing the boot Tuesday. Patient has been walking on the foot more. Some pain but not as much as before. The shoulder is painful today. Epidural in her back recently. Back is still painful.       Past Medical History:  Diagnosis Date  . ANXIETY 03/30/2007  . BACK PAIN 02/20/2009  . Essential hypertension, benign 05/31/2012  .  Family history of anesthesia complication    "mother has hard time waking up"  . GERD 01/13/2007  . H/O hiatal hernia   . HYPERLIPIDEMIA 02/22/2008  . HYPOTHYROIDISM 03/30/2007  . MENOPAUSAL DISORDER 02/23/2008  . Nocturia   . OBSTRUCTIVE SLEEP APNEA 06/29/2007   has not used c pap x 8 months due to wt loss  . Osteoarthritis   . SINUSITIS- ACUTE-NOS 03/30/2007  . THORACIC/LUMBOSACRAL NEURITIS/RADICULITIS UNSPEC 02/23/2008  . Wheezing 01/08/2009   Past Surgical History:  Procedure Laterality Date  . ABDOMINAL HYSTERECTOMY  2004   with BSO  . BREATH TEK H PYLORI N/A 05/05/2013   Procedure: BREATH TEK H PYLORI;  Surgeon: Shann Medal, MD;  Location: [facility];  Service: General;  Laterality: N/A;  . CHOLECYSTECTOMY  1980  . GASTRIC ROUX-EN-Y N/A 09/12/2013   Procedure: LAPAROSCOPIC ROUX-EN-Y GASTRIC BYPASS WITH UPPER ENDOSCOPY WITH ENTEROLYSIS OF ADHESIONS;  Surgeon: Shann Medal, MD;  Location: [facility];  Service: General;  Laterality: N/A;  . jaw bone surgury     59 yo  . TONSILLECTOMY     Social History   Socioeconomic History  . Marital status: Widowed    Spouse name: Not on file  . Number of children: Not on file  . Years of education: Not on file  . Highest education level: Not on file  Occupational History  . Not on file  Tobacco Use  . Smoking status: Former Smoker  Quit date: 07/06/1997    Years since quitting: 22.0  . Smokeless tobacco: Never Used  Vaping Use  . Vaping Use: Never used  Substance and Sexual Activity  . Alcohol use: Yes    Comment: [place]  . Drug use: No  . Sexual activity: Not Currently    Birth control/protection: Surgical    Comment: intercourse age 61, less than 5 sexual partners,des neg  Other Topics Concern  . Not on file  Social History Narrative  . Not on file   Social Determinants of Health   Financial Resource Strain:   . Difficulty of Paying Living Expenses:   Food Insecurity:   . Worried About Ship broker in the Last Year:   . Arboriculturist in the Last Year:   Transportation Needs:   . Film/video editor (Medical):   Marland Kitchen Lack of Transportation (Non-Medical):   Physical Activity:   . Days of Exercise per Week:   . Minutes of Exercise per Session:   Stress:   . Feeling of Stress :   Social Connections:   . Frequency of Communication with Friends and Family:   . Frequency of Social Gatherings with Friends and Family:   . Attends Religious Services:   . Active Member of Clubs or Organizations:   . Attends Archivist Meetings:   Marland Kitchen Marital Status:    Allergies  Allergen Reactions  . Codeine Nausea Only   Family History  Problem Relation Age of Onset  . Stroke Mother   . Hypertension Mother   . Diabetes Mother   . Cancer Mother        basel cell cancer  . Thyroid disease Mother   . Arthritis Other   . Colon polyps Father 30       "CANCEROUS POLYPS REMOVED THIS YEAR"  . Asthma Brother   . Thyroid disease Maternal Grandmother   . Lymphoma Maternal Grandmother   . Osteoporosis Other   . Breast cancer Maternal Aunt        mat great aunt  . Diabetes Paternal Grandmother   . Heart disease Paternal Grandmother   . Colon cancer Neg Hx     Current Outpatient Medications (Endocrine & Metabolic):  .  levothyroxine (SYNTHROID) 88 MCG tablet, TAKE 1 TABLET BY MOUTH EVERY MORNING.    Current Outpatient Medications (Analgesics):  .  acetaminophen (TYLENOL) 325 MG tablet, Take 650 mg by mouth every 6 (six) hours as needed for mild pain or headache. .  allopurinol (ZYLOPRIM) 100 MG tablet, Take 2 tablets (200 mg total) by mouth daily. .  traMADol (ULTRAM) 50 MG tablet, Take 1 tablet (50 mg total) by mouth every 6 (six) hours as needed.  Current Outpatient Medications (Hematological):  Marland Kitchen  Cyanocobalamin (B-12 PO), Take 5 mLs by mouth as needed (low b12).  Current Outpatient Medications (Other):  Marland Kitchen  CALCIUM PO, Take 5 mLs by mouth daily. .  carBAMazepine  (TEGRETOL PO), Take 100 mg by mouth 2 (two) times daily. .  clobetasol (TEMOVATE) 0.05 % external solution,  .  Diclofenac Sodium (PENNSAID) 2 % SOLN, Place 1 application onto the skin 2 (two) times daily. .  diclofenac Sodium (VOLTAREN) 1 % GEL, Apply 2 g topically 4 (four) times daily. As needed for right shoulder .  DULoxetine (CYMBALTA) 60 MG capsule, Take 1 capsule (60 mg total) by mouth daily. Marland Kitchen  gabapentin (NEURONTIN) 100 MG capsule, TAKE 2 CAPSULES BY MOUTH AT BEDTIME .  Multiple Vitamins-Minerals (MULTIVITAMIN ADULT PO), Take 1 tablet by mouth daily. Marland Kitchen  omeprazole (PRILOSEC) 20 MG capsule, TAKE 2 CAPSULES BY MOUTH DAILY. Marland Kitchen  topiramate (TOPAMAX) 50 MG tablet, Take 1 tablet by mouth daily.   Reviewed prior external information including notes and imaging from  primary care provider As well as notes that were available from care everywhere and other healthcare systems.  Past medical history, social, surgical and family history all reviewed in electronic medical record.  No pertanent information unless stated regarding to the chief complaint.   Review of Systems:  No headache, visual changes, nausea, vomiting, diarrhea, constipation, dizziness, abdominal pain, skin rash, fevers, chills, night sweats, weight loss, swollen lymph nodes, , chest pain, shortness of breath, mood changes. POSITIVE muscle aches, body aches, joint swelling  Objective  There were no vitals taken for this visit.   General: No apparent distress alert and oriented x3 mood and affect normal, dressed appropriately.  HEENT: Pupils equal, extraocular movements intact  Respiratory: Patient's speak in full sentences and does not appear short of breath  Cardiovascular: No lower extremity edema, non tender, no erythema  Neuro: Cranial nerves II through XII are intact, neurovascularly intact in all extremities with 2+ DTRs and 2+ pulses.  Gait ambulates with the aid of a walker. Right shoulder still has voluntary  guarding noted of the shoulder.  Diffuse tenderness noted.  Patient has tenderness of multiple different areas of her back and legs and arms.   Impression and Recommendations:     The above documentation has been reviewed and is accurate and complete Jacqualin Combes       Note: This dictation was prepared with Dragon dictation along with smaller phrase technology. Any transcriptional errors that result from this process are unintentional.

## 2019-07-13 NOTE — Assessment & Plan Note (Signed)
Patient continues to have more of a polyarthralgia.  Total time reviewing patient's chart what is being done from outside sources unknown time with patient as well as daughter greater than 36 minutes today.  Patient increase of her Cymbalta today that I think hopefully will be beneficial.  We discussed icing regimen, discussed the potential for trying a short course of colchicine to see if the pseudogout could be potentially playing more of a role this time.  Discussed icing regimen discussed potential need for repeating injection but would like to avoid doing too many if possible for patient's young age.  Patient has already had advanced imaging of the shoulder that showed some very minimal intrasubstance tearing but nothing severe.  Follow-up again 6 to 8 weeks.

## 2019-07-13 NOTE — Patient Instructions (Addendum)
Good to see you Increase cymbalta 60 mg Monday, wendsday and Friday  Try the colchicine 2 times a day for 3 days (may make you poo) Send message in 2 weeks and if better we will increase the cymbalta  If not better need to consider EMG of the arm  See me again in 5 weeks

## 2019-07-24 ENCOUNTER — Other Ambulatory Visit: Payer: Self-pay

## 2019-07-24 ENCOUNTER — Ambulatory Visit: Payer: BC Managed Care – PPO | Admitting: Nurse Practitioner

## 2019-07-24 ENCOUNTER — Encounter: Payer: Self-pay | Admitting: Nurse Practitioner

## 2019-07-24 VITALS — BP 118/78 | Ht 64.0 in | Wt 192.0 lb

## 2019-07-24 DIAGNOSIS — Z01419 Encounter for gynecological examination (general) (routine) without abnormal findings: Secondary | ICD-10-CM

## 2019-07-24 DIAGNOSIS — M4180 Other forms of scoliosis, site unspecified: Secondary | ICD-10-CM | POA: Diagnosis not present

## 2019-07-24 DIAGNOSIS — R61 Generalized hyperhidrosis: Secondary | ICD-10-CM

## 2019-07-24 NOTE — Patient Instructions (Addendum)
Probiotic   Health Maintenance, Female Adopting a healthy lifestyle and getting preventive care are important in promoting health and wellness. Ask your health care provider about:  The right schedule for you to have regular tests and exams.  Things you can do on your own to prevent diseases and keep yourself healthy. What should I know about diet, weight, and exercise? Eat a healthy diet   Eat a diet that includes plenty of vegetables, fruits, low-fat dairy products, and lean protein.  Do not eat a lot of foods that are high in solid fats, added sugars, or sodium. Maintain a healthy weight Body mass index (BMI) is used to identify weight problems. It estimates body fat based on height and weight. Your health care provider can help determine your BMI and help you achieve or maintain a healthy weight. Get regular exercise Get regular exercise. This is one of the most important things you can do for your health. Most adults should:  Exercise for at least 150 minutes each week. The exercise should increase your heart rate and make you sweat (moderate-intensity exercise).  Do strengthening exercises at least twice a week. This is in addition to the moderate-intensity exercise.  Spend less time sitting. Even light physical activity can be beneficial. Watch cholesterol and blood lipids Have your blood tested for lipids and cholesterol at 60 years of age, then have this test every 5 years. Have your cholesterol levels checked more often if:  Your lipid or cholesterol levels are high.  You are older than 60 years of age.  You are at high risk for heart disease. What should I know about cancer screening? Depending on your health history and family history, you may need to have cancer screening at various ages. This may include screening for:  Breast cancer.  Cervical cancer.  Colorectal cancer.  Skin cancer.  Lung cancer. What should I know about heart disease, diabetes, and high  blood pressure? Blood pressure and heart disease  High blood pressure causes heart disease and increases the risk of stroke. This is more likely to develop in people who have high blood pressure readings, are of African descent, or are overweight.  Have your blood pressure checked: ? Every 3-5 years if you are 60-36 years of age. ? Every year if you are 68 years old or older. Diabetes Have regular diabetes screenings. This checks your fasting blood sugar level. Have the screening done:  Once every three years after age 60 if you are at a normal weight and have a low risk for diabetes.  More often and at a younger age if you are overweight or have a high risk for diabetes. What should I know about preventing infection? Hepatitis B If you have a higher risk for hepatitis B, you should be screened for this virus. Talk with your health care provider to find out if you are at risk for hepatitis B infection. Hepatitis C Testing is recommended for:  Everyone born from [birth year] through 1966.  Anyone with known risk factors for hepatitis C. Sexually transmitted infections (STIs)  Get screened for STIs, including gonorrhea and chlamydia, if: ? You are sexually active and are younger than 60 years of age. ? You are older than 60 years of age and your health care provider tells you that you are at risk for this type of infection. ? Your sexual activity has changed since you were last screened, and you are at increased risk for chlamydia or gonorrhea. Ask your health  care provider if you are at risk.  Ask your health care provider about whether you are at high risk for HIV. Your health care provider may recommend a prescription medicine to help prevent HIV infection. If you choose to take medicine to prevent HIV, you should first get tested for HIV. You should then be tested every 3 months for as long as you are taking the medicine. Pregnancy  If you are about to stop having your period  (premenopausal) and you may become pregnant, seek counseling before you get pregnant.  Take 400 to 800 micrograms (mcg) of folic acid every day if you become pregnant.  Ask for birth control (contraception) if you want to prevent pregnancy. Osteoporosis and menopause Osteoporosis is a disease in which the bones lose minerals and strength with aging. This can result in bone fractures. If you are 24 years old or older, or if you are at risk for osteoporosis and fractures, ask your health care provider if you should:  Be screened for bone loss.  Take a calcium or vitamin D supplement to lower your risk of fractures.  Be given hormone replacement therapy (HRT) to treat symptoms of menopause. Follow these instructions at home: Lifestyle  Do not use any products that contain nicotine or tobacco, such as cigarettes, e-cigarettes, and chewing tobacco. If you need help quitting, ask your health care provider.  Do not use street drugs.  Do not share needles.  Ask your health care provider for help if you need support or information about quitting drugs. Alcohol use  Do not drink alcohol if: ? Your health care provider tells you not to drink. ? You are pregnant, may be pregnant, or are planning to become pregnant.  If you drink alcohol: ? Limit how much you use to 0-1 drink a day. ? Limit intake if you are breastfeeding.  Be aware of how much alcohol is in your drink. In the [place] one drink equals one 12 oz bottle of beer (355 mL), one 5 oz glass of wine (148 mL), or one 1 oz glass of hard liquor (44 mL). General instructions  Schedule regular health, dental, and eye exams.  Stay current with your vaccines.  Tell your health care provider if: ? You often feel depressed. ? You have ever been abused or do not feel safe at home. Summary  Adopting a healthy lifestyle and getting preventive care are important in promoting health and wellness.  Follow your health care provider's  instructions about healthy diet, exercising, and getting tested or screened for diseases.  Follow your health care provider's instructions on monitoring your cholesterol and blood pressure. This information is not intended to replace advice given to you by your health care provider. Make sure you discuss any questions you have with your health care provider. Document Revised: 01/05/2018 Document Reviewed: 01/05/2018 Elsevier Patient Education  2020 [employer].

## 2019-07-24 NOTE — Progress Notes (Signed)
   Trany Chernick November 21, 1960 [id number]   History:  59 y.o. G2 P2 presents for annual exam.  2004 TAH with BSO for endometriosis.  No HRT.  Normal Pap and mammogram history.  Primary care manages hypothyroidism and bone densities.  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  Doing well on Lexapro for anxiety/depression since husbands passing ing 2017.   Saw Sports Medicine for right shoulder pain where they found bone lesions suggestive of MM and was referred to hematology. During workup of shoulder pain a positive ANA was found and rheumatology diagnosed her with fibromyalgia. She has had some changes in medications and has noticed as increase in night sweats and body odor.   Gynecologic History No LMP recorded. Patient has had a hysterectomy.   Contraception: status post hysterectomy Last Pap: no longer screening per guidelines and pt request Last mammogram: 08/31/2018. Results were: normal Last colonoscopy: 09/12/2010. Results were: normal Last Dexa: 05/09/12. Results were: normal  Past medical history, past surgical history, family history and social history were all reviewed and documented in the EPIC chart.  ROS:  A ROS was performed and pertinent positives and negatives are included.  Exam:  Vitals:   07/24/19 1108  BP: 118/78  Weight: 192 lb (87.1 kg)  Height: 5\' 4"  (1.626 m)   Body mass index is 32.96 kg/m.  General appearance:  Normal Thyroid:  Symmetrical, normal in size, without palpable masses or nodularity. Respiratory  Auscultation:  Clear without wheezing or rhonchi Cardiovascular  Auscultation:  Regular rate, without rubs, murmurs or gallops  Edema/varicosities:  Not grossly evident Abdominal  Soft,nontender, without masses, guarding or rebound.  Liver/spleen:  No organomegaly noted  Hernia:  None appreciated  Skin  Inspection:  Grossly normal   Breasts: Examined lying and sitting.   Right: Without  masses, retractions, discharge or axillary adenopathy.   Left: Without masses, retractions, discharge or axillary adenopathy. Gentitourinary   Inguinal/mons:  Normal without inguinal adenopathy  External genitalia:  Normal  BUS/Urethra/Skene's glands:  Normal  Vagina:  Urethral prolapse, atrophic changes  Cervix:  Absent  Uterus:  Absent  Adnexa/parametria:     Rt: Without masses or tenderness.   Lt: Without masses or tenderness.  Anus and perineum: Normal, non-bleeding hemorrhoids  Digital rectal exam: Normal sphincter tone without palpated masses or tenderness  Assessment/Plan:  59 y.o. G2 P2 for annual exam.   Well female exam with routine gynecological exam - Education provided on SBEs, importance of preventative screenings, current guidelines, high calcium diet, regular exercise, and multivitamin daily. Recommended focusing more on diet since she is not able to be as active due to pain and fatigue related fibromyalgia. Her medications have been adjusted recently for this.   Night sweats - most likely due to recent increase/changes in medications. She asked about using Lume natural deodorant and of course this is fine to use. Also recommend probiotic for body odor.   Follow up in 1 year for annual      [place], 11:46 AM 07/24/2019

## 2019-07-27 DIAGNOSIS — M4180 Other forms of scoliosis, site unspecified: Secondary | ICD-10-CM | POA: Insufficient documentation

## 2019-07-27 DIAGNOSIS — M545 Low back pain: Secondary | ICD-10-CM | POA: Diagnosis not present

## 2019-07-28 MED FILL — CYCLOBENZAPRINE HCL 5 MG TA: 5 | 10 days supply | Qty: 30 | Fill #0

## 2019-08-02 DIAGNOSIS — M4180 Other forms of scoliosis, site unspecified: Secondary | ICD-10-CM | POA: Diagnosis not present

## 2019-08-02 DIAGNOSIS — M4316 Spondylolisthesis, lumbar region: Secondary | ICD-10-CM | POA: Diagnosis not present

## 2019-08-02 MED FILL — CELECOXIB 200 MG CAP: 200 | 30 days supply | Qty: 30 | Fill #0

## 2019-08-17 MED FILL — ALLOPURINOL 100 MG TABS: 100 | 30 days supply | Qty: 60 | Fill #4

## 2019-08-17 MED FILL — LEVOTHYROXINE 88 MCG TABLET: 88 | 90 days supply | Qty: 90 | Fill #2

## 2019-08-18 ENCOUNTER — Other Ambulatory Visit (HOSPITAL_COMMUNITY): Payer: Self-pay | Admitting: Neurological Surgery

## 2019-08-18 MED FILL — TOPIRAMATE 50 MG TABLET: 50 | 30 days supply | Qty: 30 | Fill #0

## 2019-08-18 MED FILL — carBAMazepine ER 100 MG TB1: 100 | 30 days supply | Qty: 120 | Fill #0

## 2019-08-21 DIAGNOSIS — L639 Alopecia areata, unspecified: Secondary | ICD-10-CM | POA: Diagnosis not present

## 2019-08-22 ENCOUNTER — Ambulatory Visit: Payer: BC Managed Care – PPO | Admitting: Family Medicine

## 2019-08-22 ENCOUNTER — Other Ambulatory Visit: Payer: Self-pay

## 2019-08-22 ENCOUNTER — Encounter: Payer: Self-pay | Admitting: Family Medicine

## 2019-08-22 VITALS — BP 140/82 | HR 70 | Ht 64.0 in | Wt 190.0 lb

## 2019-08-22 DIAGNOSIS — M5441 Lumbago with sciatica, right side: Secondary | ICD-10-CM

## 2019-08-22 DIAGNOSIS — M5442 Lumbago with sciatica, left side: Secondary | ICD-10-CM

## 2019-08-22 DIAGNOSIS — M19011 Primary osteoarthritis, right shoulder: Secondary | ICD-10-CM

## 2019-08-22 DIAGNOSIS — G8929 Other chronic pain: Secondary | ICD-10-CM

## 2019-08-22 DIAGNOSIS — M7530 Calcific tendinitis of unspecified shoulder: Secondary | ICD-10-CM

## 2019-08-22 MED ORDER — CELECOXIB 100 MG PO CAPS: 100.0000 mg | ORAL_CAPSULE | Freq: Two times a day (BID) | ORAL | 0 refills | Status: DC

## 2019-08-22 MED FILL — CELECOXIB 100 MG CAPS: 100 | 30 days supply | Qty: 60 | Fill #0

## 2019-08-22 NOTE — Assessment & Plan Note (Signed)
Stable.  Can consider injection again.  Do believe that there is still is more of a bursitis of the shoulder and may need to repeat.  Continue the allopurinol.  Increase Celebrex 200 mg twice daily warned of potential side effects.  Increase activity slowly.  Follow-up again in 4 to 8 weeks

## 2019-08-22 NOTE — Progress Notes (Signed)
[place] Phone: [phone] Subjective:   Kelli Bell, am serving as a scribe for Dr. Hulan Saas. This visit occurred during the SARS-CoV-2 public health emergency.  Safety protocols were in place, including screening questions prior to the visit, additional usage of staff PPE, and extensive cleaning of exam room while observing appropriate contact time as indicated for disinfecting solutions.  I'm seeing this patient by the request  of:  Biagio Borg, MD  CC: Right shoulder pain, back pain follow-up  [facility]:[id number]   07/13/2019 Patient continues to have more of a polyarthralgia.  Total time reviewing patient's chart what is being done from outside sources unknown time with patient as well as daughter greater than 36 minutes today.  Patient increase of her Cymbalta today that I think hopefully will be beneficial.  We discussed icing regimen, discussed the potential for trying a short course of colchicine to see if the pseudogout could be potentially playing more of a role this time.  Discussed icing regimen discussed potential need for repeating injection but would like to avoid doing too many if possible for patient's young age.  Patient has already had advanced imaging of the shoulder that showed some very minimal intrasubstance tearing but nothing severe.  Follow-up again 6 to 8 weeks.  Update 08/22/2019 Kelli Bell is a 59 y.o. female coming in with complaint of right shoulder pain. Patient states that she has started medication for her back pain as well as epidural injections. Last injection did not help. Is going to begin water aerobics class to improve strength of legs to help with ambulation. Patient presents with a cane today.  Intermittent shoulder pain. Using Tylenol and heat for pain. Using tart cherry for pseudogout.  Patient has had imaging done that showed only very mild intrasubstance tearing but  otherwise fairly unremarkable.  States that the pain comes and goes.  Has days where she has very minimal pain in the days where it is unrelenting and keeps her up at night.  Does not know why it seems to be getting significant differences at this time.  Patient is accompanied with daughter.    Past Medical History:  Diagnosis Date  . ANXIETY 03/30/2007  . BACK PAIN 02/20/2009  . Essential hypertension, benign 05/31/2012  . Family history of anesthesia complication    "mother has hard time waking up"  . GERD 01/13/2007  . H/O hiatal hernia   . HYPERLIPIDEMIA 02/22/2008  . HYPOTHYROIDISM 03/30/2007  . MENOPAUSAL DISORDER 02/23/2008  . Nocturia   . OBSTRUCTIVE SLEEP APNEA 06/29/2007   has not used c pap x 8 months due to wt loss  . Osteoarthritis   . SINUSITIS- ACUTE-NOS 03/30/2007  . THORACIC/LUMBOSACRAL NEURITIS/RADICULITIS UNSPEC 02/23/2008  . Wheezing 01/08/2009   Past Surgical History:  Procedure Laterality Date  . ABDOMINAL HYSTERECTOMY  2004   with BSO  . BREATH TEK H PYLORI N/A 05/05/2013   Procedure: BREATH TEK H PYLORI;  Surgeon: Shann Medal, MD;  Location: [facility];  Service: General;  Laterality: N/A;  . CHOLECYSTECTOMY  1980  . GASTRIC ROUX-EN-Y N/A 09/12/2013   Procedure: LAPAROSCOPIC ROUX-EN-Y GASTRIC BYPASS WITH UPPER ENDOSCOPY WITH ENTEROLYSIS OF ADHESIONS;  Surgeon: Shann Medal, MD;  Location: [facility];  Service: General;  Laterality: N/A;  . jaw bone surgury     59 yo  . TONSILLECTOMY     Social History   Socioeconomic History  . Marital status:  Widowed    Spouse name: Not on file  . Number of children: Not on file  . Years of education: Not on file  . Highest education level: Not on file  Occupational History  . Not on file  Tobacco Use  . Smoking status: Former Smoker    Quit date: 07/06/1997    Years since quitting: 22.1  . Smokeless tobacco: Never Used  Vaping Use  . Vaping Use: Never used  Substance and Sexual Activity  . Alcohol use: Yes     Comment: [place]  . Drug use: Bell  . Sexual activity: Not Currently    Birth control/protection: Surgical    Comment: intercourse age 83, less than 5 sexual partners,des neg  Other Topics Concern  . Not on file  Social History Narrative  . Not on file   Social Determinants of Health   Financial Resource Strain:   . Difficulty of Paying Living Expenses:   Food Insecurity:   . Worried About Charity fundraiser in the Last Year:   . Arboriculturist in the Last Year:   Transportation Needs:   . Film/video editor (Medical):   Marland Kitchen Lack of Transportation (Non-Medical):   Physical Activity:   . Days of Exercise per Week:   . Minutes of Exercise per Session:   Stress:   . Feeling of Stress :   Social Connections:   . Frequency of Communication with Friends and Family:   . Frequency of Social Gatherings with Friends and Family:   . Attends Religious Services:   . Active Member of Clubs or Organizations:   . Attends Archivist Meetings:   Marland Kitchen Marital Status:    Allergies  Allergen Reactions  . Codeine Nausea Only   Family History  Problem Relation Age of Onset  . Stroke Mother   . Hypertension Mother   . Diabetes Mother   . Cancer Mother        basel cell cancer  . Thyroid disease Mother   . Arthritis Other   . Colon polyps Father 71       "CANCEROUS POLYPS REMOVED THIS YEAR"  . Asthma Brother   . Thyroid disease Maternal Grandmother   . Lymphoma Maternal Grandmother   . Osteoporosis Other   . Breast cancer Maternal Aunt        mat great aunt  . Diabetes Paternal Grandmother   . Heart disease Paternal Grandmother   . Colon cancer Neg Hx     Current Outpatient Medications (Endocrine & Metabolic):  .  levothyroxine (SYNTHROID) 88 MCG tablet, TAKE 1 TABLET BY MOUTH EVERY MORNING.    Current Outpatient Medications (Analgesics):  .  acetaminophen (TYLENOL) 325 MG tablet, Take 650 mg by mouth every 6 (six) hours as needed for mild pain or  headache. .  allopurinol (ZYLOPRIM) 100 MG tablet, Take 2 tablets (200 mg total) by mouth daily. .  celecoxib (CELEBREX) 50 MG capsule, Take 50 mg by mouth 2 (two) times daily. .  traMADol (ULTRAM) 50 MG tablet, Take 1 tablet (50 mg total) by mouth every 6 (six) hours as needed. .  celecoxib (CELEBREX) 100 MG capsule, Take 1 capsule (100 mg total) by mouth 2 (two) times daily.  Current Outpatient Medications (Hematological):  Marland Kitchen  Cyanocobalamin (B-12 PO), Take 5 mLs by mouth as needed (low b12).  Current Outpatient Medications (Other):  Marland Kitchen  CALCIUM PO, Take 5 mLs by mouth daily. .  carBAMazepine (TEGRETOL PO), Take  100 mg by mouth 2 (two) times daily. .  clobetasol (TEMOVATE) 0.05 % external solution,  .  Diclofenac Sodium (PENNSAID) 2 % SOLN, Place 1 application onto the skin 2 (two) times daily. .  diclofenac Sodium (VOLTAREN) 1 % GEL, Apply 2 g topically 4 (four) times daily. As needed for right shoulder .  DULoxetine (CYMBALTA) 60 MG capsule, Take 1 capsule (60 mg total) by mouth daily. Marland Kitchen  gabapentin (NEURONTIN) 100 MG capsule, TAKE 2 CAPSULES BY MOUTH AT BEDTIME .  Multiple Vitamins-Minerals (MULTIVITAMIN ADULT PO), Take 1 tablet by mouth daily. Marland Kitchen  omeprazole (PRILOSEC) 20 MG capsule, TAKE 2 CAPSULES BY MOUTH DAILY. Marland Kitchen  ondansetron (ZOFRAN) 4 MG tablet, Take 4 mg by mouth every 8 (eight) hours as needed for nausea or vomiting. .  topiramate (TOPAMAX) 50 MG tablet, Take 1 tablet by mouth daily.   Reviewed prior external information including notes and imaging from  primary care provider As well as notes that were available from care everywhere and other healthcare systems.  Past medical history, social, surgical and family history all reviewed in electronic medical record.  Bell pertanent information unless stated regarding to the chief complaint.   Review of Systems:  Bell headache, visual changes, nausea, vomiting, diarrhea, constipation, dizziness, abdominal pain, skin rash, fevers,  chills, night sweats, weight loss, swollen lymph nodes, , chest pain, shortness of breath, mood changes. POSITIVE muscle aches, body aches, joint swelling  Objective  Blood pressure (!) 140/82, pulse 70, height 5\' 4"  (1.626 m), weight 190 lb (86.2 kg), SpO2 97 %.   General: Bell apparent distress alert and oriented x3 mood and affect normal, dressed appropriately.  Gait antalgic.  MSK: Diffusely tender to palpation.  Patient still has some mild limited range of motion of the left shoulder but very minimal impingement noted.  Pain is still out of proportion to the amount of palpation.  Patient is comfortable sitting in the chair but deferred the back exam today.   Impression and Recommendations:     The above documentation has been reviewed and is accurate and complete Lyndal Pulley, DO       Note: This dictation was prepared with Dragon dictation along with smaller phrase technology. Any transcriptional errors that result from this process are unintentional.

## 2019-08-22 NOTE — Assessment & Plan Note (Signed)
Chronic for some time.  Has had back pain but was seen another provider.  Patient feels like she is not going back to them at the moment.  Is starting with aquatic formal physical therapy though that I think will be beneficial.  Discussed posture and ergonomics.  Discussed staying active.  Continue to use cane for stability.  Hopefully patient will respond well.  Follow-up again in 6 to 8 weeks

## 2019-08-22 NOTE — Patient Instructions (Signed)
Celebrex 100mg  2x a day Continue Cymbalta Work with aquatic therapy  See me in 7 weeks

## 2019-09-13 ENCOUNTER — Telehealth: Payer: Self-pay

## 2019-09-13 ENCOUNTER — Other Ambulatory Visit: Payer: Self-pay | Admitting: Nurse Practitioner

## 2019-09-13 DIAGNOSIS — E559 Vitamin D deficiency, unspecified: Secondary | ICD-10-CM

## 2019-09-13 DIAGNOSIS — Z1231 Encounter for screening mammogram for malignant neoplasm of breast: Secondary | ICD-10-CM

## 2019-09-13 DIAGNOSIS — E538 Deficiency of other specified B group vitamins: Secondary | ICD-10-CM

## 2019-09-13 DIAGNOSIS — Z0000 Encounter for general adult medical examination without abnormal findings: Secondary | ICD-10-CM

## 2019-09-13 NOTE — Telephone Encounter (Signed)
New message    The patient is asking for labs order to be entered into the system before appt in Nov.

## 2019-09-14 NOTE — Telephone Encounter (Signed)
Ok labs ordered 

## 2019-09-18 ENCOUNTER — Other Ambulatory Visit: Payer: Self-pay

## 2019-09-18 ENCOUNTER — Ambulatory Visit (INDEPENDENT_AMBULATORY_CARE_PROVIDER_SITE_OTHER): Payer: BC Managed Care – PPO | Admitting: Family Medicine

## 2019-09-18 ENCOUNTER — Encounter: Payer: Self-pay | Admitting: Family Medicine

## 2019-09-18 ENCOUNTER — Ambulatory Visit (INDEPENDENT_AMBULATORY_CARE_PROVIDER_SITE_OTHER): Payer: BC Managed Care – PPO

## 2019-09-18 VITALS — BP 110/78 | HR 81 | Ht 64.0 in | Wt 188.0 lb

## 2019-09-18 DIAGNOSIS — M7530 Calcific tendinitis of unspecified shoulder: Secondary | ICD-10-CM

## 2019-09-18 NOTE — Patient Instructions (Addendum)
Thank you for coming in today. Plan for injection today.  Continue you home exercise when able.  If not much better next step is probably a MRI so we know better what is causing all this pain.   Let us know.   Call or go to the ER if you develop a large red swollen joint with extreme pain or oozing puss.

## 2019-09-18 NOTE — Progress Notes (Signed)
Kelli Bell, am serving as a Education administrator for Dr. Lynne Leader.  Kelli Bell is a 59 y.o. female who presents to [place] at [facility] today for R shoulder injection. Patient last saw Dr. Tamala Bell on 08/22/2019 for R shoulder bursitis  Patient was to continue the allopurinol.  Increase Celebrex 200 mg twice daily warned of potential side effects.  Increase activity slowly. Was told could consider another injection.   Since last visit patient reports the last two days she has been having extreme pain in her front shoulder. Cannot move her arm patient has her arm held in front of her body bent like it would be in a sling. Is still taking the allopurinol. 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   Pertinent review of systems: No fevers or chills  Relevant historical information: Hypothyroidism   Exam:  BP 110/78 (BP Location: Left Arm, Patient Position: Sitting, Cuff Size: Normal)   Pulse 81   Ht 5\' 4"  (1.626 m)   Wt 188 lb (85.3 kg)   SpO2 98%   BMI 32.27 kg/m  General: Well Developed, well nourished, and in no acute distress.   MSK: Right shoulder normal-appearing nontender significantly decreased range of motion.. Intact strength within limits of motion. Positive Hawkins and Neer's test.  Negative Yergason's and speeds test.    Lab and Radiology Results  Procedure: Real-time Ultrasound Guided Injection of right shoulder subacromial bursa Device: Philips Affiniti 50G Images permanently stored and available for review in PACS Verbal informed consent obtained.  Discussed risks and benefits of procedure. Warned about infection bleeding damage to structures skin hypopigmentation and fat atrophy among others. Patient expresses understanding and agreement Time-out conducted.   Noted no overlying erythema, induration, or other signs of local infection.   Skin prepped in a sterile fashion.   Local anesthesia: Topical Ethyl  chloride.   With sterile technique and under real time ultrasound guidance:  40 mg of Kenalog and 2 mL of Marcaine injected easily.   Completed without difficulty   Pain partially immediately resolved suggesting accurate placement of the medication.   Advised to call if fevers/chills, erythema, induration, drainage, or persistent bleeding.   Images permanently stored and available for review in the ultrasound unit.  Impression: Technically successful ultrasound guided injection.         Assessment and Plan: 59 y.o. female with right shoulder pain due to exacerbation of bursitis.  Plan for injection today.  This provided moderate relief immediately.  Hopeful she will have better relief with the steroid component starting to act in the near future.  If not improving patient will notify me likely would proceed with MRI at this point.    Orders Placed This Encounter  Procedures  . [place] LIMITED JOINT SPACE STRUCTURES UP RIGHT    Standing Status:   Future    Number of Occurrences:   1    Standing Expiration Date:   09/17/2020    Order Specific Question:   Reason for Exam (SYMPTOM  OR DIAGNOSIS REQUIRED)    Answer:   Right shoulder    Order Specific Question:   Preferred imaging location?    Answer:   [facility]   No orders of the defined types were placed in this encounter.    Discussed warning signs or symptoms. Please see discharge instructions. Patient expresses understanding.   The above documentation has been reviewed and is accurate and complete Lynne Leader,  M.D.  Established patient in this practice however this is the first time I seen her for this issue.  New issue for me today.

## 2019-09-20 ENCOUNTER — Other Ambulatory Visit: Payer: Self-pay | Admitting: Family Medicine

## 2019-09-20 ENCOUNTER — Other Ambulatory Visit: Payer: Self-pay | Admitting: Internal Medicine

## 2019-09-20 DIAGNOSIS — K2100 Gastro-esophageal reflux disease with esophagitis, without bleeding: Secondary | ICD-10-CM

## 2019-09-20 MED FILL — TOPIRAMATE 50 MG TABLET: 50 | 30 days supply | Qty: 30 | Fill #1

## 2019-09-20 MED FILL — CELECOXIB 100 MG CAPS: 100 | 30 days supply | Qty: 60 | Fill #0

## 2019-09-20 MED FILL — DULOXETINE HCL 60 MG CPEP: 60 | 90 days supply | Qty: 90 | Fill #2

## 2019-09-20 MED FILL — ALLOPURINOL 100 MG TABS: 100 | 30 days supply | Qty: 60 | Fill #5

## 2019-09-20 MED FILL — carBAMazepine ER 100 MG TB1: 100 | 30 days supply | Qty: 120 | Fill #1

## 2019-09-21 ENCOUNTER — Other Ambulatory Visit: Payer: Self-pay | Admitting: Nurse Practitioner

## 2019-09-21 ENCOUNTER — Other Ambulatory Visit: Payer: Self-pay

## 2019-09-25 DIAGNOSIS — L639 Alopecia areata, unspecified: Secondary | ICD-10-CM | POA: Diagnosis not present

## 2019-09-25 MED FILL — GABAPENTIN 100 MG CAPSULE: 100 | 90 days supply | Qty: 180 | Fill #0

## 2019-09-25 MED FILL — OMEPRAZOLE 20 MG CAP: 20 | 90 days supply | Qty: 180 | Fill #0

## 2019-10-04 ENCOUNTER — Other Ambulatory Visit: Payer: Self-pay | Admitting: Nurse Practitioner

## 2019-10-05 ENCOUNTER — Ambulatory Visit: Payer: BC Managed Care – PPO

## 2019-10-06 ENCOUNTER — Telehealth: Payer: Self-pay | Admitting: *Deleted

## 2019-10-06 NOTE — Telephone Encounter (Signed)
Patient called requesting a New Rx for Mycolog twice daily to pannus as needed for superficial skin irritation. Last filled in 04/2012.  Please advise

## 2019-10-06 NOTE — Progress Notes (Deleted)
[place] Phone: [phone] Subjective:    I'm seeing this patient by the request  of:  Biagio Borg, MD  CC:   [id number]   08/22/2019 Chronic for some time.  Has had back pain but was seen another provider.  Patient feels like she is not going back to them at the moment.  Is starting with aquatic formal physical therapy though that I think will be beneficial.  Discussed posture and ergonomics.  Discussed staying active.  Continue to use cane for stability.  Hopefully patient will respond well.  Follow-up again in 6 to 8 weeks  Stable.  Can consider injection again.  Do believe that there is still is more of a bursitis of the shoulder and may need to repeat.  Continue the allopurinol.  Increase Celebrex 200 mg twice daily warned of potential side effects.  Increase activity slowly.  Follow-up again in 4 to 8 weeks  Update 10/10/2019 Kelli Bell is a 59 y.o. female coming in with complaint of right AC joint pain. Injected on 7/27 and on 8/23. Patient states       Past Medical History:  Diagnosis Date  . ANXIETY 03/30/2007  . BACK PAIN 02/20/2009  . Essential hypertension, benign 05/31/2012  . Family history of anesthesia complication    "mother has hard time waking up"  . GERD 01/13/2007  . H/O hiatal hernia   . HYPERLIPIDEMIA 02/22/2008  . HYPOTHYROIDISM 03/30/2007  . MENOPAUSAL DISORDER 02/23/2008  . Nocturia   . OBSTRUCTIVE SLEEP APNEA 06/29/2007   has not used c pap x 8 months due to wt loss  . Osteoarthritis   . SINUSITIS- ACUTE-NOS 03/30/2007  . THORACIC/LUMBOSACRAL NEURITIS/RADICULITIS UNSPEC 02/23/2008  . Wheezing 01/08/2009   Past Surgical History:  Procedure Laterality Date  . ABDOMINAL HYSTERECTOMY  2004   with BSO  . BREATH TEK H PYLORI N/A 05/05/2013   Procedure: BREATH TEK H PYLORI;  Surgeon: Shann Medal, MD;  Location: [facility];  Service: General;  Laterality: N/A;  . CHOLECYSTECTOMY   1980  . GASTRIC ROUX-EN-Y N/A 09/12/2013   Procedure: LAPAROSCOPIC ROUX-EN-Y GASTRIC BYPASS WITH UPPER ENDOSCOPY WITH ENTEROLYSIS OF ADHESIONS;  Surgeon: Shann Medal, MD;  Location: [facility];  Service: General;  Laterality: N/A;  . jaw bone surgury     59 yo  . TONSILLECTOMY     Social History   Socioeconomic History  . Marital status: Widowed    Spouse name: Not on file  . Number of children: Not on file  . Years of education: Not on file  . Highest education level: Not on file  Occupational History  . Not on file  Tobacco Use  . Smoking status: Former Smoker    Quit date: 07/06/1997    Years since quitting: 22.2  . Smokeless tobacco: Never Used  Vaping Use  . Vaping Use: Never used  Substance and Sexual Activity  . Alcohol use: Yes    Comment: [place]  . Drug use: No  . Sexual activity: Not Currently    Birth control/protection: Surgical    Comment: intercourse age 52, less than 5 sexual partners,des neg  Other Topics Concern  . Not on file  Social History Narrative  . Not on file   Social Determinants of Health   Financial Resource Strain:   . Difficulty of Paying Living Expenses: Not on file  Food Insecurity:   . Worried About Estate manager/land agent  of Food in the Last Year: Not on file  . Ran Out of Food in the Last Year: Not on file  Transportation Needs:   . Lack of Transportation (Medical): Not on file  . Lack of Transportation (Non-Medical): Not on file  Physical Activity:   . Days of Exercise per Week: Not on file  . Minutes of Exercise per Session: Not on file  Stress:   . Feeling of Stress : Not on file  Social Connections:   . Frequency of Communication with Friends and Family: Not on file  . Frequency of Social Gatherings with Friends and Family: Not on file  . Attends Religious Services: Not on file  . Active Member of Clubs or Organizations: Not on file  . Attends Archivist Meetings: Not on file  . Marital Status: Not on file    Allergies  Allergen Reactions  . Codeine Nausea Only   Family History  Problem Relation Age of Onset  . Stroke Mother   . Hypertension Mother   . Diabetes Mother   . Cancer Mother        basel cell cancer  . Thyroid disease Mother   . Arthritis Other   . Colon polyps Father 41       "CANCEROUS POLYPS REMOVED THIS YEAR"  . Asthma Brother   . Thyroid disease Maternal Grandmother   . Lymphoma Maternal Grandmother   . Osteoporosis Other   . Breast cancer Maternal Aunt        mat great aunt  . Diabetes Paternal Grandmother   . Heart disease Paternal Grandmother   . Colon cancer Neg Hx     Current Outpatient Medications (Endocrine & Metabolic):  .  levothyroxine (SYNTHROID) 88 MCG tablet, TAKE 1 TABLET BY MOUTH EVERY MORNING.    Current Outpatient Medications (Analgesics):  .  acetaminophen (TYLENOL) 325 MG tablet, Take 650 mg by mouth every 6 (six) hours as needed for mild pain or headache. .  allopurinol (ZYLOPRIM) 100 MG tablet, Take 2 tablets (200 mg total) by mouth daily. .  celecoxib (CELEBREX) 100 MG capsule, TAKE 1 CAPSULE BY MOUTH TWICE DAILY .  celecoxib (CELEBREX) 50 MG capsule, Take 50 mg by mouth 2 (two) times daily. .  traMADol (ULTRAM) 50 MG tablet, Take 1 tablet (50 mg total) by mouth every 6 (six) hours as needed.  Current Outpatient Medications (Hematological):  Marland Kitchen  Cyanocobalamin (B-12 PO), Take 5 mLs by mouth as needed (low b12).  Current Outpatient Medications (Other):  Marland Kitchen  CALCIUM PO, Take 5 mLs by mouth daily. .  carBAMazepine (TEGRETOL PO), Take 100 mg by mouth 2 (two) times daily. .  clobetasol (TEMOVATE) 0.05 % external solution,  .  Diclofenac Sodium (PENNSAID) 2 % SOLN, Place 1 application onto the skin 2 (two) times daily. .  diclofenac Sodium (VOLTAREN) 1 % GEL, Apply 2 g topically 4 (four) times daily. As needed for right shoulder .  DULoxetine (CYMBALTA) 60 MG capsule, Take 1 capsule (60 mg total) by mouth daily. Marland Kitchen  gabapentin (NEURONTIN)  100 MG capsule, TAKE 2 CAPSULES BY MOUTH AT BEDTIME .  Multiple Vitamins-Minerals (MULTIVITAMIN ADULT PO), Take 1 tablet by mouth daily. Marland Kitchen  omeprazole (PRILOSEC) 20 MG capsule, TAKE 2 CAPSULES BY MOUTH DAILY. Marland Kitchen  ondansetron (ZOFRAN) 4 MG tablet, Take 4 mg by mouth every 8 (eight) hours as needed for nausea or vomiting. .  topiramate (TOPAMAX) 50 MG tablet, Take 1 tablet by mouth daily.   Reviewed prior external  information including notes and imaging from  primary care provider As well as notes that were available from care everywhere and other healthcare systems.  Past medical history, social, surgical and family history all reviewed in electronic medical record.  No pertanent information unless stated regarding to the chief complaint.   Review of Systems:  No headache, visual changes, nausea, vomiting, diarrhea, constipation, dizziness, abdominal pain, skin rash, fevers, chills, night sweats, weight loss, swollen lymph nodes, body aches, joint swelling, chest pain, shortness of breath, mood changes. POSITIVE muscle aches  Objective  There were no vitals taken for this visit.   General: No apparent distress alert and oriented x3 mood and affect normal, dressed appropriately.  HEENT: Pupils equal, extraocular movements intact  Respiratory: Patient's speak in full sentences and does not appear short of breath  Cardiovascular: No lower extremity edema, non tender, no erythema  Neuro: Cranial nerves II through XII are intact, neurovascularly intact in all extremities with 2+ DTRs and 2+ pulses.  Gait normal with good balance and coordination.  MSK:  Non tender with full range of motion and good stability and symmetric strength and tone of shoulders, elbows, wrist, hip, knee and ankles bilaterally.     Impression and Recommendations:     The above documentation has been reviewed and is accurate and complete Kelli Bell       Note: This dictation was prepared with Dragon dictation  along with smaller phrase technology. Any transcriptional errors that result from this process are unintentional.

## 2019-10-09 ENCOUNTER — Other Ambulatory Visit: Payer: Self-pay | Admitting: Nurse Practitioner

## 2019-10-09 DIAGNOSIS — B372 Candidiasis of skin and nail: Secondary | ICD-10-CM

## 2019-10-09 MED ORDER — NYSTATIN-TRIAMCINOLONE 100000-0.1 UNIT/GM-% EX CREA: 1.0000 "application " | TOPICAL_CREAM | CUTANEOUS | 1 refills | Status: DC | PRN

## 2019-10-09 MED FILL — NYSTATIN-TRIAMCINOLONE CRM: 100000-0.1 | 30 days supply | Qty: 30 | Fill #0

## 2019-10-09 NOTE — Telephone Encounter (Signed)
I sent in the Mycolog cream to her pharmacy. Thank you

## 2019-10-10 ENCOUNTER — Ambulatory Visit: Payer: BC Managed Care – PPO | Admitting: Family Medicine

## 2019-10-19 ENCOUNTER — Encounter: Payer: Self-pay | Admitting: Family Medicine

## 2019-10-19 ENCOUNTER — Ambulatory Visit (INDEPENDENT_AMBULATORY_CARE_PROVIDER_SITE_OTHER): Payer: BC Managed Care – PPO | Admitting: Family Medicine

## 2019-10-19 ENCOUNTER — Other Ambulatory Visit: Payer: Self-pay

## 2019-10-19 VITALS — BP 118/80 | HR 65 | Ht 64.0 in | Wt 188.0 lb

## 2019-10-19 DIAGNOSIS — M2550 Pain in unspecified joint: Secondary | ICD-10-CM | POA: Diagnosis not present

## 2019-10-19 DIAGNOSIS — G8929 Other chronic pain: Secondary | ICD-10-CM | POA: Diagnosis not present

## 2019-10-19 DIAGNOSIS — M5442 Lumbago with sciatica, left side: Secondary | ICD-10-CM

## 2019-10-19 DIAGNOSIS — M5441 Lumbago with sciatica, right side: Secondary | ICD-10-CM

## 2019-10-19 MED ORDER — METHYLPREDNISOLONE ACETATE 40 MG/ML IJ SUSP
40.0000 mg | Freq: Once | INTRAMUSCULAR | Status: AC
  Administered 2019-10-19: 40 mg via INTRAMUSCULAR

## 2019-10-19 MED ORDER — KETOROLAC TROMETHAMINE 30 MG/ML IJ SOLN
30.0000 mg | Freq: Once | INTRAMUSCULAR | Status: AC
  Administered 2019-10-19: 30 mg via INTRAMUSCULAR

## 2019-10-19 NOTE — Patient Instructions (Signed)
Overall decent See me in 7-8 weeks enjoy the coast!

## 2019-10-19 NOTE — Progress Notes (Signed)
[place] [address] [place] Phone: [phone] Subjective:   I Kelli Bell am serving as a Education administrator for Dr. Hulan Saas.  This visit occurred during the SARS-CoV-2 public health emergency.  Safety protocols were in place, including screening questions prior to the visit, additional usage of staff PPE, and extensive cleaning of exam room while observing appropriate contact time as indicated for disinfecting solutions.   I'm seeing this patient by the request  of:  Biagio Borg, MD  CC: all over pain   [id number]   08/22/2019 Chronic for some time.  Has had back pain but was seen another provider.  Patient feels like she is not going back to them at the moment.  Is starting with aquatic formal physical therapy though that I think will be beneficial.  Discussed posture and ergonomics.  Discussed staying active.  Continue to use cane for stability.  Hopefully patient will respond well.  Follow-up again in 6 to 8 weeks  Stable.  Can consider injection again.  Do believe that there is still is more of a bursitis of the shoulder and may need to repeat.  Continue the allopurinol.  Increase Celebrex 200 mg twice daily warned of potential side effects.  Increase activity slowly.  Follow-up again in 4 to 8 weeks  Update 10/19/2019 Kelli Bell is a 59 y.o. female coming in with complaint of right shoulder and back pain. Patient states she is compensating and the left shoulder is also hurting. Wants to talk about her hands. States her hands are shaky and her fingers twitch. Mom and daughters have tremors and parkinson's runs in her family. Right shoulder feels better at times. ADLs causing pain and popping. Patient states she is walking better and using her cane however making progress. Feels more comfortable using cane while out and about. Still doing water aerobics 3 times a week. Going to the beach during the whole month of October.       Past  Medical History:  Diagnosis Date  . ANXIETY 03/30/2007  . BACK PAIN 02/20/2009  . Essential hypertension, benign 05/31/2012  . Family history of anesthesia complication    "mother has hard time waking up"  . GERD 01/13/2007  . H/O hiatal hernia   . HYPERLIPIDEMIA 02/22/2008  . HYPOTHYROIDISM 03/30/2007  . MENOPAUSAL DISORDER 02/23/2008  . Nocturia   . OBSTRUCTIVE SLEEP APNEA 06/29/2007   has not used c pap x 8 months due to wt loss  . Osteoarthritis   . SINUSITIS- ACUTE-NOS 03/30/2007  . THORACIC/LUMBOSACRAL NEURITIS/RADICULITIS UNSPEC 02/23/2008  . Wheezing 01/08/2009   Past Surgical History:  Procedure Laterality Date  . ABDOMINAL HYSTERECTOMY  2004   with BSO  . BREATH TEK H PYLORI N/A 05/05/2013   Procedure: BREATH TEK H PYLORI;  Surgeon: Shann Medal, MD;  Location: [facility];  Service: General;  Laterality: N/A;  . CHOLECYSTECTOMY  1980  . GASTRIC ROUX-EN-Y N/A 09/12/2013   Procedure: LAPAROSCOPIC ROUX-EN-Y GASTRIC BYPASS WITH UPPER ENDOSCOPY WITH ENTEROLYSIS OF ADHESIONS;  Surgeon: Shann Medal, MD;  Location: [facility];  Service: General;  Laterality: N/A;  . jaw bone surgury     59 yo  . TONSILLECTOMY     Social History   Socioeconomic History  . Marital status: Widowed    Spouse name: Not on file  . Number of children: Not on file  . Years of education: Not on file  . Highest education level: Not on file  Occupational History  . Not on file  Tobacco Use  . Smoking status: Former Smoker    Quit date: 07/06/1997    Years since quitting: 22.3  . Smokeless tobacco: Never Used  Vaping Use  . Vaping Use: Never used  Substance and Sexual Activity  . Alcohol use: Yes    Comment: [place]  . Drug use: No  . Sexual activity: Not Currently    Birth control/protection: Surgical    Comment: intercourse age 33, less than 5 sexual partners,des neg  Other Topics Concern  . Not on file  Social History Narrative  . Not on file   Social Determinants of  Health   Financial Resource Strain:   . Difficulty of Paying Living Expenses: Not on file  Food Insecurity:   . Worried About Charity fundraiser in the Last Year: Not on file  . Ran Out of Food in the Last Year: Not on file  Transportation Needs:   . Lack of Transportation (Medical): Not on file  . Lack of Transportation (Non-Medical): Not on file  Physical Activity:   . Days of Exercise per Week: Not on file  . Minutes of Exercise per Session: Not on file  Stress:   . Feeling of Stress : Not on file  Social Connections:   . Frequency of Communication with Friends and Family: Not on file  . Frequency of Social Gatherings with Friends and Family: Not on file  . Attends Religious Services: Not on file  . Active Member of Clubs or Organizations: Not on file  . Attends Archivist Meetings: Not on file  . Marital Status: Not on file   Allergies  Allergen Reactions  . Codeine Nausea Only   Family History  Problem Relation Age of Onset  . Stroke Mother   . Hypertension Mother   . Diabetes Mother   . Cancer Mother        basel cell cancer  . Thyroid disease Mother   . Arthritis Other   . Colon polyps Father 49       "CANCEROUS POLYPS REMOVED THIS YEAR"  . Asthma Brother   . Thyroid disease Maternal Grandmother   . Lymphoma Maternal Grandmother   . Osteoporosis Other   . Breast cancer Maternal Aunt        mat great aunt  . Diabetes Paternal Grandmother   . Heart disease Paternal Grandmother   . Colon cancer Neg Hx     Current Outpatient Medications (Endocrine & Metabolic):  .  levothyroxine (SYNTHROID) 88 MCG tablet, TAKE 1 TABLET BY MOUTH EVERY MORNING.    Current Outpatient Medications (Analgesics):  .  acetaminophen (TYLENOL) 325 MG tablet, Take 650 mg by mouth every 6 (six) hours as needed for mild pain or headache. .  allopurinol (ZYLOPRIM) 100 MG tablet, Take 2 tablets (200 mg total) by mouth daily. .  celecoxib (CELEBREX) 100 MG capsule, TAKE 1  CAPSULE BY MOUTH TWICE DAILY .  traMADol (ULTRAM) 50 MG tablet, Take 1 tablet (50 mg total) by mouth every 6 (six) hours as needed.  Current Outpatient Medications (Hematological):  Marland Kitchen  Cyanocobalamin (B-12 PO), Take 5 mLs by mouth as needed (low b12).  Current Outpatient Medications (Other):  Marland Kitchen  CALCIUM PO, Take 5 mLs by mouth daily. .  carBAMazepine (TEGRETOL PO), Take 100 mg by mouth 2 (two) times daily. .  clobetasol (TEMOVATE) 0.05 % external solution,  .  DULoxetine (CYMBALTA) 60 MG capsule, Take 1 capsule (  60 mg total) by mouth daily. Marland Kitchen  gabapentin (NEURONTIN) 100 MG capsule, TAKE 2 CAPSULES BY MOUTH AT BEDTIME .  Multiple Vitamins-Minerals (MULTIVITAMIN ADULT PO), Take 1 tablet by mouth daily. Marland Kitchen  nystatin-triamcinolone (MYCOLOG II) cream, Apply 1 application topically as needed. Marland Kitchen  omeprazole (PRILOSEC) 20 MG capsule, TAKE 2 CAPSULES BY MOUTH DAILY. Marland Kitchen  ondansetron (ZOFRAN) 4 MG tablet, Take 4 mg by mouth every 8 (eight) hours as needed for nausea or vomiting. .  topiramate (TOPAMAX) 50 MG tablet, Take 1 tablet by mouth daily.   Reviewed prior external information including notes and imaging from  primary care provider As well as notes that were available from care everywhere and other healthcare systems.  Past medical history, social, surgical and family history all reviewed in electronic medical record.  No pertanent information unless stated regarding to the chief complaint.   Review of Systems:  No headache, visual changes, nausea, vomiting, diarrhea, constipation, dizziness, abdominal pain, skin rash, fevers, chills, night sweats, weight loss, swollen lymph nodes,chest pain, shortness of breath, mood changes. POSITIVE muscle aches, body aches, joint pain and swelling   Objective  Blood pressure 118/80, pulse 65, height 5\' 4"  (1.626 m), weight 188 lb (85.3 kg), SpO2 98 %.   General: No apparent distress alert and oriented x3 mood and affect normal, dressed appropriately.    HEENT: Pupils equal, extraocular movements intact  Respiratory: Patient's speak in full sentences and does not appear short of breath  Gait normal with good balance and coordination.  MSK:  ttp diffusely in muscles, more than anticipated, shoulder still mild impingement noted but good strength.  Low back mild loss of lordosis, NVI distally and symmetric strength.     Impression and Recommendations:     The above documentation has been reviewed and is accurate and complete Lyndal Pulley, DO       Note: This dictation was prepared with Dragon dictation along with smaller phrase technology. Any transcriptional errors that result from this process are unintentional.

## 2019-10-23 NOTE — Assessment & Plan Note (Signed)
Continued pain toradol and depo given today, discussed celebrex again, allopurionl and cymbalta. Discussed avoiding certain activities, but stay active.  Total time with patient and reviewing chart on date of office visit, 33 minutes.  RTC in 6-8 weeks

## 2019-10-24 ENCOUNTER — Telehealth: Payer: Self-pay | Admitting: *Deleted

## 2019-10-24 DIAGNOSIS — L639 Alopecia areata, unspecified: Secondary | ICD-10-CM | POA: Diagnosis not present

## 2019-10-24 NOTE — Telephone Encounter (Signed)
Taking both medications increases her risk for serotonin syndrome and really should not be taken together. I recommend she reach out to Dr. Jenny Reichmann about this since he is the one who made the adjustments.

## 2019-10-24 NOTE — Telephone Encounter (Signed)
Patient informed with below note. 

## 2019-10-24 NOTE — Telephone Encounter (Signed)
Patient called because Lexapro 10 mg tablet was denied, it appears patient saw her PCP Dr.John (office note on 02/27/19)  and he told her to d/c lexapro and start Cymbalta 60 cap. Patient said she never fully stopped the Lexapro reports it helps with anxiety and the Cymbalta is not helping with anxiety,but does help with fibromyalgia pain. She is scheduled on 12/07/19 to see Dr.John and will discuss this with him. Reports nancy originally prescribed medication for anxiety. She is leaving town and asked if you would be willing to refill enough until her appointment with Dr.John? Please advise

## 2019-10-25 ENCOUNTER — Ambulatory Visit: Payer: BC Managed Care – PPO | Admitting: Internal Medicine

## 2019-10-25 ENCOUNTER — Telehealth: Payer: Self-pay | Admitting: Internal Medicine

## 2019-10-25 NOTE — Telephone Encounter (Signed)
Sent to Dr. John to advise. 

## 2019-10-25 NOTE — Telephone Encounter (Signed)
Taking lexapro and cymbalta is not usually recommended, so I would not be able to do this, thanks

## 2019-10-25 NOTE — Telephone Encounter (Signed)
Patient is calling and requesting if Dr.John could refill her Lexapro 10 mg. Her gynecology use to fill this medication and she has retired and the office will not fill the medication now. She had an appointment with Dr.Jones for today but she just found out she has been exposed to [place] and now can not come in.   Please follow up with patient. Thank you. [place] Phone:  [phone]  Fax:  [phone]

## 2019-10-26 MED ORDER — ALPRAZOLAM 0.25 MG PO TABS: 0.2500 mg | ORAL_TABLET | Freq: Two times a day (BID) | ORAL | 0 refills | Status: DC | PRN

## 2019-10-26 MED FILL — ALPRAZolam 0.25 MG TABS: 0.25 | 20 days supply | Qty: 40 | Fill #0

## 2019-10-26 NOTE — Telephone Encounter (Signed)
Patient understands she can't have lexapro, is asking to get the    ALPRAZolam 0.25 MG  Doesn't want to go out of town with no medication

## 2019-10-26 NOTE — Telephone Encounter (Signed)
Sent to Dr. John. 

## 2019-10-26 NOTE — Telephone Encounter (Signed)
Done erx 

## 2019-10-28 ENCOUNTER — Encounter: Payer: Self-pay | Admitting: Family Medicine

## 2019-10-30 NOTE — Telephone Encounter (Signed)
I called patient and informed of information below.

## 2019-11-03 DIAGNOSIS — M25511 Pain in right shoulder: Secondary | ICD-10-CM | POA: Diagnosis not present

## 2019-11-08 DIAGNOSIS — M797 Fibromyalgia: Secondary | ICD-10-CM | POA: Diagnosis not present

## 2019-11-08 DIAGNOSIS — Z87891 Personal history of nicotine dependence: Secondary | ICD-10-CM | POA: Diagnosis not present

## 2019-11-08 DIAGNOSIS — S4991XA Unspecified injury of right shoulder and upper arm, initial encounter: Secondary | ICD-10-CM | POA: Diagnosis not present

## 2019-11-08 DIAGNOSIS — W1830XA Fall on same level, unspecified, initial encounter: Secondary | ICD-10-CM | POA: Diagnosis not present

## 2019-11-08 DIAGNOSIS — W19XXXA Unspecified fall, initial encounter: Secondary | ICD-10-CM | POA: Diagnosis not present

## 2019-11-09 DIAGNOSIS — M67813 Other specified disorders of tendon, right shoulder: Secondary | ICD-10-CM | POA: Diagnosis not present

## 2019-11-15 DIAGNOSIS — M67813 Other specified disorders of tendon, right shoulder: Secondary | ICD-10-CM | POA: Diagnosis not present

## 2019-11-20 ENCOUNTER — Telehealth: Payer: Self-pay

## 2019-11-20 DIAGNOSIS — S42254A Nondisplaced fracture of greater tuberosity of right humerus, initial encounter for closed fracture: Secondary | ICD-10-CM | POA: Diagnosis not present

## 2019-11-20 DIAGNOSIS — M75121 Complete rotator cuff tear or rupture of right shoulder, not specified as traumatic: Secondary | ICD-10-CM | POA: Diagnosis not present

## 2019-11-20 NOTE — Telephone Encounter (Signed)
Patient daughter called stating that patient fell and cracked her proximal humerus and wants to talk to Dr. Tamala Julian assistant to give all the information on her fall and what happened. Patient is still gone on vacation but will need to be seen/wants to be seen when she is back. I have scheduled patient for 11/27/2019 at 1:15pm

## 2019-11-21 NOTE — Telephone Encounter (Signed)
Spoke with patient's daughter. Patient tripped going into [employer] [facility]. Was seen in ED and by an ortho in [facility]. Patient/daughter are working on gathering MRI results and office visit notes prior to appointment on Monday when patient returns from [place].

## 2019-11-25 ENCOUNTER — Other Ambulatory Visit: Payer: Self-pay | Admitting: Family Medicine

## 2019-11-25 MED FILL — ALLOPURINOL 100 MG TABS: 100 | 30 days supply | Qty: 60 | Fill #6

## 2019-11-27 ENCOUNTER — Ambulatory Visit (INDEPENDENT_AMBULATORY_CARE_PROVIDER_SITE_OTHER): Payer: BC Managed Care – PPO | Admitting: Family Medicine

## 2019-11-27 ENCOUNTER — Other Ambulatory Visit: Payer: Self-pay

## 2019-11-27 ENCOUNTER — Encounter: Payer: Self-pay | Admitting: Family Medicine

## 2019-11-27 ENCOUNTER — Ambulatory Visit (INDEPENDENT_AMBULATORY_CARE_PROVIDER_SITE_OTHER): Payer: BC Managed Care – PPO

## 2019-11-27 DIAGNOSIS — M25511 Pain in right shoulder: Secondary | ICD-10-CM

## 2019-11-27 DIAGNOSIS — L639 Alopecia areata, unspecified: Secondary | ICD-10-CM | POA: Diagnosis not present

## 2019-11-27 DIAGNOSIS — Z23 Encounter for immunization: Secondary | ICD-10-CM | POA: Diagnosis not present

## 2019-11-27 DIAGNOSIS — M75101 Unspecified rotator cuff tear or rupture of right shoulder, not specified as traumatic: Secondary | ICD-10-CM | POA: Insufficient documentation

## 2019-11-27 DIAGNOSIS — S46011A Strain of muscle(s) and tendon(s) of the rotator cuff of right shoulder, initial encounter: Secondary | ICD-10-CM

## 2019-11-27 MED ORDER — HYDROCODONE-ACETAMINOPHEN 5-325 MG PO TABS: 1.0000 | ORAL_TABLET | Freq: Three times a day (TID) | ORAL | 0 refills | Status: DC | PRN

## 2019-11-27 MED FILL — HYDROCODON-APAP 5-325: 5-325 | 5 days supply | Qty: 15 | Fill #0

## 2019-11-27 MED FILL — CELECOXIB 100 MG CAPS: 100 | 30 days supply | Qty: 60 | Fill #0

## 2019-11-27 MED FILL — CLOBETASOL 0.05% SOLUTION: 0.05 | 14 days supply | Qty: 50 | Fill #0

## 2019-11-27 NOTE — Patient Instructions (Addendum)
Dr. Tia Alert If you take Norco, do not take Tramadol or Tylenol

## 2019-11-27 NOTE — Assessment & Plan Note (Signed)
Patient has had a right shoulder sling but now having significant worsening after this traumatic fall.  Patient does have a nondisplaced tuberosity fracture noted on MRI from [place].  Patient also has a now high-grade partial thickness tear of the supraspinatus.  Patient has done physical therapy for the shoulder previously and I do think it will be beneficial but I do think surgical intervention with the traumatic tear may be more beneficial for this individual and will be referred to orthopedic surgery to discuss.  Patient is in significant amount of pain minimally elevated patient stable mild hydrocodone.  Warned of potential side effects.  Has had an allergy to codeine but tramadol is not helping.  Patient is also not responded well to the gabapentin and is already on Cymbalta and Celebrex.  Patient will continue the other medications patient knows I am here if she has any questions.  Total time reviewing patient's chart, outside notes but did not have the imaging, as well as talking to patient and daughter 36 minutes today

## 2019-11-27 NOTE — Progress Notes (Signed)
[place] Phone: [phone] Subjective:   [name], am serving as a scribe for Dr. Hulan Saas. This visit occurred during the SARS-CoV-2 public health emergency.  Safety protocols were in place, including screening questions prior to the visit, additional usage of staff PPE, and extensive cleaning of exam room while observing appropriate contact time as indicated for disinfecting solutions.   I'm seeing this patient by the request  of:  Biagio Borg, MD  CC: Fall with right-sided shoulder pain  FUX:[id number]      Update 11/27/2019 Kelli Bell is a 59 y.o. female coming in with complaint of right shoulder and back pain. Since last visit patient has suffered a fall that resulted in RTC tear and proximal humerus fracture.  Patient did have an MRI she was bearing [place].  MRI report was reviewed and it was a nondisplaced greater tuberosity fracture with a high-grade partial-thickness tear of the rotator cuff. Patient has been trying to do some of the range of motion exercises that she was given at home but finds it very difficult.  Pain is severe and not responding well to Tylenol or tramadol.  Patient states that it is affecting daily activities as well as her sleep significantly.  Sometimes has radiation down the arm.  Denies any weakness of the hand.  Denies any associated neck pain.     Past Medical History:  Diagnosis Date  . ANXIETY 03/30/2007  . BACK PAIN 02/20/2009  . Essential hypertension, benign 05/31/2012  . Family history of anesthesia complication    "mother has hard time waking up"  . GERD 01/13/2007  . H/O hiatal hernia   . HYPERLIPIDEMIA 02/22/2008  . HYPOTHYROIDISM 03/30/2007  . MENOPAUSAL DISORDER 02/23/2008  . Nocturia   . OBSTRUCTIVE SLEEP APNEA 06/29/2007   has not used c pap x 8 months due to wt loss  . Osteoarthritis   . SINUSITIS- ACUTE-NOS 03/30/2007  . THORACIC/LUMBOSACRAL  NEURITIS/RADICULITIS UNSPEC 02/23/2008  . Wheezing 01/08/2009   Past Surgical History:  Procedure Laterality Date  . ABDOMINAL HYSTERECTOMY  2004   with BSO  . BREATH TEK H PYLORI N/A 05/05/2013   Procedure: BREATH TEK H PYLORI;  Surgeon: Shann Medal, MD;  Location: [facility];  Service: General;  Laterality: N/A;  . CHOLECYSTECTOMY  1980  . GASTRIC ROUX-EN-Y N/A 09/12/2013   Procedure: LAPAROSCOPIC ROUX-EN-Y GASTRIC BYPASS WITH UPPER ENDOSCOPY WITH ENTEROLYSIS OF ADHESIONS;  Surgeon: Shann Medal, MD;  Location: [facility];  Service: General;  Laterality: N/A;  . jaw bone surgury     59 yo  . TONSILLECTOMY     Social History   Socioeconomic History  . Marital status: Widowed    Spouse name: Not on file  . Number of children: Not on file  . Years of education: Not on file  . Highest education level: Not on file  Occupational History  . Not on file  Tobacco Use  . Smoking status: Former Smoker    Quit date: 07/06/1997    Years since quitting: 22.4  . Smokeless tobacco: Never Used  Vaping Use  . Vaping Use: Never used  Substance and Sexual Activity  . Alcohol use: Yes    Comment: [place]  . Drug use: No  . Sexual activity: Not Currently    Birth control/protection: Surgical    Comment: intercourse age 4, less than 5 sexual partners,des neg  Other Topics Concern  .  Not on file  Social History Narrative  . Not on file   Social Determinants of Health   Financial Resource Strain:   . Difficulty of Paying Living Expenses: Not on file  Food Insecurity:   . Worried About Charity fundraiser in the Last Year: Not on file  . Ran Out of Food in the Last Year: Not on file  Transportation Needs:   . Lack of Transportation (Medical): Not on file  . Lack of Transportation (Non-Medical): Not on file  Physical Activity:   . Days of Exercise per Week: Not on file  . Minutes of Exercise per Session: Not on file  Stress:   . Feeling of Stress : Not on file    Social Connections:   . Frequency of Communication with Friends and Family: Not on file  . Frequency of Social Gatherings with Friends and Family: Not on file  . Attends Religious Services: Not on file  . Active Member of Clubs or Organizations: Not on file  . Attends Archivist Meetings: Not on file  . Marital Status: Not on file   Allergies  Allergen Reactions  . Codeine Nausea Only   Family History  Problem Relation Age of Onset  . Stroke Mother   . Hypertension Mother   . Diabetes Mother   . Cancer Mother        basel cell cancer  . Thyroid disease Mother   . Arthritis Other   . Colon polyps Father 32       "CANCEROUS POLYPS REMOVED THIS YEAR"  . Asthma Brother   . Thyroid disease Maternal Grandmother   . Lymphoma Maternal Grandmother   . Osteoporosis Other   . Breast cancer Maternal Aunt        mat great aunt  . Diabetes Paternal Grandmother   . Heart disease Paternal Grandmother   . Colon cancer Neg Hx     Current Outpatient Medications (Endocrine & Metabolic):  .  levothyroxine (SYNTHROID) 88 MCG tablet, TAKE 1 TABLET BY MOUTH EVERY MORNING.    Current Outpatient Medications (Analgesics):  .  acetaminophen (TYLENOL) 325 MG tablet, Take 650 mg by mouth every 6 (six) hours as needed for mild pain or headache. .  allopurinol (ZYLOPRIM) 100 MG tablet, Take 2 tablets (200 mg total) by mouth daily. .  celecoxib (CELEBREX) 100 MG capsule, TAKE 1 CAPSULE BY MOUTH TWICE DAILY .  HYDROcodone-acetaminophen (NORCO/VICODIN) 5-325 MG tablet, Take 1 tablet by mouth every 8 (eight) hours as needed for severe pain. .  traMADol (ULTRAM) 50 MG tablet, Take 1 tablet (50 mg total) by mouth every 6 (six) hours as needed.  Current Outpatient Medications (Hematological):  Marland Kitchen  Cyanocobalamin (B-12 PO), Take 5 mLs by mouth as needed (low b12).  Current Outpatient Medications (Other):  Marland Kitchen  ALPRAZolam (XANAX) 0.25 MG tablet, Take 1 tablet (0.25 mg total) by mouth 2 (two) times  daily as needed for anxiety. Marland Kitchen  CALCIUM PO, Take 5 mLs by mouth daily. .  carBAMazepine (TEGRETOL PO), Take 100 mg by mouth 2 (two) times daily. .  clobetasol (TEMOVATE) 0.05 % external solution,  .  DULoxetine (CYMBALTA) 60 MG capsule, Take 1 capsule (60 mg total) by mouth daily. Marland Kitchen  gabapentin (NEURONTIN) 100 MG capsule, TAKE 2 CAPSULES BY MOUTH AT BEDTIME .  Multiple Vitamins-Minerals (MULTIVITAMIN ADULT PO), Take 1 tablet by mouth daily. Marland Kitchen  nystatin-triamcinolone (MYCOLOG II) cream, Apply 1 application topically as needed. Marland Kitchen  omeprazole (PRILOSEC) 20 MG capsule,  TAKE 2 CAPSULES BY MOUTH DAILY. Marland Kitchen  ondansetron (ZOFRAN) 4 MG tablet, Take 4 mg by mouth every 8 (eight) hours as needed for nausea or vomiting. .  topiramate (TOPAMAX) 50 MG tablet, Take 1 tablet by mouth daily.   Reviewed prior external information including notes and imaging from  primary care provider As well as notes that were available from care everywhere and other healthcare systems.  Past medical history, social, surgical and family history all reviewed in electronic medical record.  No pertanent information unless stated regarding to the chief complaint.   Review of Systems:  No headache, visual changes, nausea, vomiting, diarrhea, constipation, dizziness, abdominal pain, skin rash, fevers, chills, night sweats, weight loss, swollen lymph nodes,joint swelling, chest pain, shortness of breath, mood changes. POSITIVE muscle aches, body aches  Objective  Height 5\' 4"  (1.626 m).   General: No apparent distress alert and oriented x3 mood and affect normal, dressed appropriately.  Patient appears to be very uncomfortable.  Was wearing a sling initially HEENT: Pupils equal, extraocular movements intact  Respiratory: Patient's speak in full sentences and does not appear short of breath  Cardiovascular: No lower extremity edema, non tender, no erythema   Right shoulder exam shows patient is significantly tender to palpation  diffusely, have pain overall unable to do rest of the exam  the resting exam secondary to the amount of pain.  Neurovascularly intact distally.    Impression and Recommendations:     The above documentation has been reviewed and is accurate and complete Lyndal Pulley, DO

## 2019-11-28 ENCOUNTER — Encounter: Payer: Self-pay | Admitting: Internal Medicine

## 2019-11-28 ENCOUNTER — Ambulatory Visit (INDEPENDENT_AMBULATORY_CARE_PROVIDER_SITE_OTHER): Payer: BC Managed Care – PPO | Admitting: Internal Medicine

## 2019-11-28 ENCOUNTER — Other Ambulatory Visit: Payer: Self-pay

## 2019-11-28 ENCOUNTER — Telehealth: Payer: Self-pay | Admitting: Family Medicine

## 2019-11-28 VITALS — BP 122/78 | HR 73 | Temp 98.5°F | Ht 64.0 in | Wt 183.0 lb

## 2019-11-28 DIAGNOSIS — R1010 Upper abdominal pain, unspecified: Secondary | ICD-10-CM | POA: Diagnosis not present

## 2019-11-28 DIAGNOSIS — R413 Other amnesia: Secondary | ICD-10-CM

## 2019-11-28 DIAGNOSIS — R739 Hyperglycemia, unspecified: Secondary | ICD-10-CM | POA: Diagnosis not present

## 2019-11-28 DIAGNOSIS — E559 Vitamin D deficiency, unspecified: Secondary | ICD-10-CM | POA: Diagnosis not present

## 2019-11-28 DIAGNOSIS — E039 Hypothyroidism, unspecified: Secondary | ICD-10-CM | POA: Diagnosis not present

## 2019-11-28 DIAGNOSIS — E611 Iron deficiency: Secondary | ICD-10-CM

## 2019-11-28 DIAGNOSIS — K219 Gastro-esophageal reflux disease without esophagitis: Secondary | ICD-10-CM | POA: Diagnosis not present

## 2019-11-28 DIAGNOSIS — E7849 Other hyperlipidemia: Secondary | ICD-10-CM | POA: Diagnosis not present

## 2019-11-28 DIAGNOSIS — F32A Depression, unspecified: Secondary | ICD-10-CM

## 2019-11-28 LAB — URINALYSIS, ROUTINE W REFLEX MICROSCOPIC
Hgb urine dipstick: NEGATIVE
Leukocytes,Ua: NEGATIVE
Nitrite: NEGATIVE
Specific Gravity, Urine: 1.02 (ref 1.000–1.030)
Total Protein, Urine: NEGATIVE
Urine Glucose: NEGATIVE
Urobilinogen, UA: 1 (ref 0.0–1.0)
pH: 7.5 (ref 5.0–8.0)

## 2019-11-28 LAB — CBC WITH DIFFERENTIAL/PLATELET
Basophils Absolute: 0 10*3/uL (ref 0.0–0.1)
Basophils Relative: 1.1 % (ref 0.0–3.0)
Eosinophils Absolute: 0 10*3/uL (ref 0.0–0.7)
Eosinophils Relative: 1 % (ref 0.0–5.0)
HCT: 37 % (ref 36.0–46.0)
Hemoglobin: 12.3 g/dL (ref 12.0–15.0)
Lymphocytes Relative: 31.5 % (ref 12.0–46.0)
Lymphs Abs: 1.4 10*3/uL (ref 0.7–4.0)
MCHC: 33.2 g/dL (ref 30.0–36.0)
MCV: 91.6 fl (ref 78.0–100.0)
Monocytes Absolute: 0.6 10*3/uL (ref 0.1–1.0)
Monocytes Relative: 12.7 % — ABNORMAL HIGH (ref 3.0–12.0)
Neutro Abs: 2.4 10*3/uL (ref 1.4–7.7)
Neutrophils Relative %: 53.7 % (ref 43.0–77.0)
Platelets: 380 10*3/uL (ref 150.0–400.0)
RBC: 4.04 Mil/uL (ref 3.87–5.11)
RDW: 15.2 % (ref 11.5–15.5)
WBC: 4.5 10*3/uL (ref 4.0–10.5)

## 2019-11-28 LAB — HEPATIC FUNCTION PANEL
ALT: 10 U/L (ref 0–35)
AST: 16 U/L (ref 0–37)
Albumin: 3.8 g/dL (ref 3.5–5.2)
Alkaline Phosphatase: 80 U/L (ref 39–117)
Bilirubin, Direct: 0 mg/dL (ref 0.0–0.3)
Total Bilirubin: 0.3 mg/dL (ref 0.2–1.2)
Total Protein: 6.1 g/dL (ref 6.0–8.3)

## 2019-11-28 LAB — LIPID PANEL
Cholesterol: 258 mg/dL — ABNORMAL HIGH (ref 0–200)
HDL: 118.3 mg/dL (ref >39.00)
LDL Cholesterol: 120 mg/dL — ABNORMAL HIGH (ref 0–99)
NonHDL: 139.74
Total CHOL/HDL Ratio: 2
Triglycerides: 100 mg/dL (ref 0.0–149.0)
VLDL: 20 mg/dL (ref 0.0–40.0)

## 2019-11-28 LAB — TSH: TSH: 0.65 u[IU]/mL (ref 0.35–4.50)

## 2019-11-28 LAB — H. PYLORI ANTIBODY, IGG: H Pylori IgG: NEGATIVE

## 2019-11-28 LAB — T4, FREE: Free T4: 0.84 ng/dL (ref 0.60–1.60)

## 2019-11-28 LAB — IBC PANEL
Iron: 43 ug/dL (ref 42–145)
Saturation Ratios: 12.4 % — ABNORMAL LOW (ref 20.0–50.0)
Transferrin: 248 mg/dL (ref 212.0–360.0)

## 2019-11-28 LAB — VITAMIN B12: Vitamin B-12: 927 pg/mL — ABNORMAL HIGH (ref 211–911)

## 2019-11-28 LAB — HEMOGLOBIN A1C: Hgb A1c MFr Bld: 6 % (ref 4.6–6.5)

## 2019-11-28 LAB — VITAMIN D 25 HYDROXY (VIT D DEFICIENCY, FRACTURES): VITD: 29.36 ng/mL — ABNORMAL LOW (ref 30.00–100.00)

## 2019-11-28 NOTE — Progress Notes (Signed)
Subjective:    Patient ID: Kelli Bell, female    DOB: 22-Sep-1960, 59 y.o.   MRN: [id number]  HPI  Here to f/u with daughter, with cc of epigastric pain; pain is s/p gastric bypass 04-17-2013 and s/p ccx; c/o nausea post prandial with diarrhea associated with sweats on occasion; ongoing for several wk with wax and wane appetite, overall mild to mod, but denies fever, hematemesis, brbpr or melena,  Has overall lost 5 lbs in the past month for unclear reasons.  Is on ppi, Denies worsening reflux, dysphagia.   Also has scheduled f/u with heme/onc in 04/17/20.  Also has ongoing chronic right shoulder pain in recent months started with pseudogout, then a small tear, but another [place] with humerus fracture (non displaced) and rot cuff tear, now in arm sling, has seen sports med, but now to see ortho soon.   Due for covid booster soon. Also with recent worsening essetiall type tremor, is right handed, and has some difficulty with dialing on the phone and signing things. Husband died in 2015/04/18 with cancer, lives alone , here with daughter, still has apartment but more trouble with steps due to arthirtis, so staying with her own elderly parents living on one level.  Daughter with her today mentions some worsneing memory issue over the past yr, not related to meds she thinks, seems to be more confused recenlty more often lately, still good with med administration for herself.  Sometimes has to write notes to herself.  But no other getting lost or repeating quesitons Denies worsening depressive symptoms but has ongoing symptoms, but no suicidal ideation, or panic; has ongoing anxiety Wt Readings from Last 3 Encounters:  11/28/19 183 lb (83 kg)  10/19/19 188 lb (85.3 kg)  09/18/19 188 lb (85.3 kg)   Past Medical History:  Diagnosis Date  . ANXIETY 03/30/2007  . BACK PAIN 02/20/2009  . Essential hypertension, benign 05/31/2012  . Family history of anesthesia complication    "mother has hard time  waking up"  . GERD 01/13/2007  . H/O hiatal hernia   . HYPERLIPIDEMIA 02/22/2008  . HYPOTHYROIDISM 03/30/2007  . MENOPAUSAL DISORDER 02/23/2008  . Nocturia   . OBSTRUCTIVE SLEEP APNEA 06/29/2007   has not used c pap x 8 months due to wt loss  . Osteoarthritis   . SINUSITIS- ACUTE-NOS 03/30/2007  . THORACIC/LUMBOSACRAL NEURITIS/RADICULITIS UNSPEC 02/23/2008  . Wheezing 01/08/2009   Past Surgical History:  Procedure Laterality Date  . ABDOMINAL HYSTERECTOMY  April 18, 2002   with BSO  . BREATH TEK H PYLORI N/A 05/05/2013   Procedure: BREATH TEK H PYLORI;  Surgeon: Shann Medal, MD;  Location: [facility];  Service: General;  Laterality: N/A;  . CHOLECYSTECTOMY  1980  . GASTRIC ROUX-EN-Y N/A 09/12/2013   Procedure: LAPAROSCOPIC ROUX-EN-Y GASTRIC BYPASS WITH UPPER ENDOSCOPY WITH ENTEROLYSIS OF ADHESIONS;  Surgeon: Shann Medal, MD;  Location: [facility];  Service: General;  Laterality: N/A;  . jaw bone surgury     59 yo  . TONSILLECTOMY      reports that she quit smoking about 22 years ago. She has never used smokeless tobacco. She reports current alcohol use. She reports that she does not use drugs. family history includes Arthritis in an other family member; Asthma in her brother; Breast cancer in her maternal aunt; Cancer in her mother; Colon polyps (age of onset: 101) in her father; Diabetes in her mother and paternal grandmother; Heart disease in her paternal  grandmother; Hypertension in her mother; Lymphoma in her maternal grandmother; Osteoporosis in an other family member; Stroke in her mother; Thyroid disease in her maternal grandmother and mother. Allergies  Allergen Reactions  . Codeine Nausea Only   Current Outpatient Medications on File Prior to Visit  Medication Sig Dispense Refill  . acetaminophen (TYLENOL) 325 MG tablet Take 650 mg by mouth every 6 (six) hours as needed for mild pain or headache.    . allopurinol (ZYLOPRIM) 100 MG tablet Take 2 tablets (200 mg total) by mouth daily. 60  tablet 6  . ALPRAZolam (XANAX) 0.25 MG tablet Take 1 tablet (0.25 mg total) by mouth 2 (two) times daily as needed for anxiety. 40 tablet 0  . CALCIUM PO Take 5 mLs by mouth daily.    . carBAMazepine (TEGRETOL PO) Take 100 mg by mouth 2 (two) times daily.    . celecoxib (CELEBREX) 100 MG capsule TAKE 1 CAPSULE BY MOUTH TWICE DAILY 60 capsule 0  . clobetasol (TEMOVATE) 0.05 % external solution     . Cyanocobalamin (B-12 PO) Take 5 mLs by mouth as needed (low b12).    . DULoxetine (CYMBALTA) 60 MG capsule Take 1 capsule (60 mg total) by mouth daily. 90 capsule 3  . gabapentin (NEURONTIN) 100 MG capsule TAKE 2 CAPSULES BY MOUTH AT BEDTIME 180 capsule 0  . HYDROcodone-acetaminophen (NORCO/VICODIN) 5-325 MG tablet Take 1 tablet by mouth every 8 (eight) hours as needed for severe pain. 15 tablet 0  . levothyroxine (SYNTHROID) 88 MCG tablet TAKE 1 TABLET BY MOUTH EVERY MORNING. 90 tablet 3  . Multiple Vitamins-Minerals (MULTIVITAMIN ADULT PO) Take 1 tablet by mouth daily.    Marland Kitchen nystatin-triamcinolone (MYCOLOG II) cream Apply 1 application topically as needed. 30 g 1  . omeprazole (PRILOSEC) 20 MG capsule TAKE 2 CAPSULES BY MOUTH DAILY. 180 capsule 1  . ondansetron (ZOFRAN) 4 MG tablet Take 4 mg by mouth every 8 (eight) hours as needed for nausea or vomiting.    . topiramate (TOPAMAX) 50 MG tablet Take 1 tablet by mouth daily.    . traMADol (ULTRAM) 50 MG tablet Take 1 tablet (50 mg total) by mouth every 6 (six) hours as needed. 60 tablet 3   No current facility-administered medications on file prior to visit.   Review of Systems All otherwise neg per pt Lab Results  Component Value Date   WBC 4.5 11/28/2019   HGB 12.3 11/28/2019   HCT 37.0 11/28/2019   PLT 380.0 11/28/2019   GLUCOSE 90 04/13/2019   CHOL 258 (H) 11/28/2019   TRIG 100.0 11/28/2019   HDL 118.30 11/28/2019   LDLDIRECT 94.8 04/29/2012   LDLCALC 120 (H) 11/28/2019   ALT 10 11/28/2019   AST 16 11/28/2019   NA 139 04/13/2019   K  3.9 04/13/2019   CL 103 04/13/2019   CREATININE 0.65 04/13/2019   BUN 11 04/13/2019   CO2 26 04/13/2019   TSH 0.65 11/28/2019   HGBA1C 6.0 11/28/2019      Objective:   Physical Exam BP 122/78   Pulse 73   Temp 98.5 F (36.9 C) (Oral)   Ht 5\' 4"  (1.626 m)   Wt 183 lb (83 kg)   SpO2 96%   BMI 31.41 kg/m  VS noted,  Constitutional: Pt appears in NAD HENT: Head: NCAT.  Right Ear: External ear normal.  Left Ear: External ear normal.  Eyes: . Pupils are equal, round, and reactive to light. Conjunctivae and EOM are normal Nose: without  d/c or deformity Neck: Neck supple. Gross normal ROM Cardiovascular: Normal rate and regular rhythm.   Pulmonary/Chest: Effort normal and breath sounds without rales or wheezing.  Abd:  Soft, ND, + BS, no organomegaly with mild epigastric tender Neurological: Pt is alert. At baseline orientation, motor grossly intact Skin: Skin is warm. No rashes, other new lesions, no LE edema Psychiatric: Pt behavior is normal without agitation  All otherwise neg per pt Lab Results  Component Value Date   WBC 4.5 11/28/2019   HGB 12.3 11/28/2019   HCT 37.0 11/28/2019   PLT 380.0 11/28/2019   GLUCOSE 90 04/13/2019   CHOL 258 (H) 11/28/2019   TRIG 100.0 11/28/2019   HDL 118.30 11/28/2019   LDLDIRECT 94.8 04/29/2012   LDLCALC 120 (H) 11/28/2019   ALT 10 11/28/2019   AST 16 11/28/2019   NA 139 04/13/2019   K 3.9 04/13/2019   CL 103 04/13/2019   CREATININE 0.65 04/13/2019   BUN 11 04/13/2019   CO2 26 04/13/2019   TSH 0.65 11/28/2019   HGBA1C 6.0 11/28/2019      Assessment & Plan:

## 2019-11-28 NOTE — Telephone Encounter (Signed)
[facility] called to make Dr Tamala Julian aware of the readings from the patient's STAT xray done yesterday.  FINDINGS: Bones are diffusely demineralized. Although not well demonstrated due to osteopenia and positioning, there is probably a nondisplaced fracture involving the humeral head, potentially an isolated greater tuberosity fracture or nondisplaced humeral neck fracture. No evidence for shoulder separation or dislocation.  IMPRESSION: Areas of cortical off step identified on the scapular Y-view and axillary view, suggesting probable nondisplaced fracture involving the humeral head, potentially isolated greater tuberosity fracture or nondisplaced humeral neck fracture. CT imaging would likely prove helpful to further evaluate.

## 2019-11-28 NOTE — Patient Instructions (Signed)
Please continue all other medications as before, and refills have been done if requested.  Please have the pharmacy call with any other refills you may need.  Please continue your efforts at being more active, low cholesterol diet, and weight control..  Please keep your appointments with your specialists as you may have planned  You will be contacted regarding the referral for: Gastroenterology, and MRI  Please go to the LAB at the blood drawing area for the tests to be done  You will be contacted by phone if any changes need to be made immediately.  Otherwise, you will receive a letter about your results with an explanation, but please check with MyChart first.  Please remember to sign up for MyChart if you have not done so, as this will be important to you in the future with finding out test results, communicating by private email, and scheduling acute appointments online when needed.

## 2019-11-29 ENCOUNTER — Encounter: Payer: Self-pay | Admitting: Internal Medicine

## 2019-11-29 DIAGNOSIS — S42254A Nondisplaced fracture of greater tuberosity of right humerus, initial encounter for closed fracture: Secondary | ICD-10-CM | POA: Diagnosis not present

## 2019-11-29 DIAGNOSIS — M25511 Pain in right shoulder: Secondary | ICD-10-CM | POA: Diagnosis not present

## 2019-11-29 DIAGNOSIS — M25562 Pain in left knee: Secondary | ICD-10-CM | POA: Diagnosis not present

## 2019-11-29 NOTE — Assessment & Plan Note (Signed)
stable overall by history and exam, recent data reviewed with pt, and pt to continue medical treatment as before,  to f/u any worsening symptoms or concerns  

## 2019-11-29 NOTE — Assessment & Plan Note (Addendum)
Etiology unclear, cont ppi, for labs as ordered, refer GI  I spent 41 minutes in preparing to see the patient by review of recent labs, imaging and procedures, obtaining and reviewing separately obtained history, communicating with the patient and family or caregiver, ordering medications, tests or procedures, and documenting clinical information in the EHR including the differential Dx, treatment, and any further evaluation and other management of upper abd pain, memory loss, depression, gerd, hypothyroidism, hld

## 2019-11-29 NOTE — Assessment & Plan Note (Signed)
Declines change in tx for now or counseling

## 2019-11-29 NOTE — Assessment & Plan Note (Signed)
Etiology unclear - ? Pseudodementia with depression/anxeity, for MRI

## 2019-11-29 NOTE — Assessment & Plan Note (Signed)
For lab f/u 

## 2019-11-30 ENCOUNTER — Ambulatory Visit: Payer: BC Managed Care – PPO | Admitting: Family Medicine

## 2019-12-01 ENCOUNTER — Ambulatory Visit
Admission: RE | Admit: 2019-12-01 | Discharge: 2019-12-01 | Disposition: A | Discharge status: Home / Self Care | Payer: BC Managed Care – PPO | Source: Ambulatory Visit | Attending: Nurse Practitioner | Admitting: Nurse Practitioner

## 2019-12-01 ENCOUNTER — Other Ambulatory Visit: Payer: Self-pay

## 2019-12-01 DIAGNOSIS — Z1231 Encounter for screening mammogram for malignant neoplasm of breast: Secondary | ICD-10-CM

## 2019-12-07 ENCOUNTER — Ambulatory Visit: Payer: BC Managed Care – PPO | Admitting: Family Medicine

## 2019-12-07 ENCOUNTER — Encounter: Payer: BC Managed Care – PPO | Admitting: Internal Medicine

## 2019-12-13 ENCOUNTER — Other Ambulatory Visit: Payer: Self-pay | Admitting: Internal Medicine

## 2019-12-13 MED FILL — ALPRAZolam 0.25 MG TABS: 0.25 | 20 days supply | Qty: 40 | Fill #0

## 2019-12-13 MED FILL — OMEPRAZOLE 20 MG CAP: 20 | 90 days supply | Qty: 180 | Fill #1

## 2019-12-13 MED FILL — TOPIRAMATE 50 MG TABLET: 50 | 30 days supply | Qty: 30 | Fill #2

## 2019-12-13 MED FILL — carBAMazepine ER 100 MG TB1: 100 | 30 days supply | Qty: 120 | Fill #2

## 2019-12-13 MED FILL — DULOXETINE HCL 60 MG CPEP: 60 | 90 days supply | Qty: 90 | Fill #3

## 2019-12-13 NOTE — Telephone Encounter (Signed)
Done erx 

## 2019-12-15 ENCOUNTER — Encounter: Payer: Self-pay | Admitting: Family Medicine

## 2019-12-17 ENCOUNTER — Ambulatory Visit
Admission: RE | Admit: 2019-12-17 | Discharge: 2019-12-17 | Disposition: A | Discharge status: Home / Self Care | Payer: BC Managed Care – PPO | Source: Ambulatory Visit | Attending: Internal Medicine | Admitting: Internal Medicine

## 2019-12-17 ENCOUNTER — Other Ambulatory Visit: Payer: Self-pay

## 2019-12-17 DIAGNOSIS — R413 Other amnesia: Secondary | ICD-10-CM

## 2019-12-17 DIAGNOSIS — J3489 Other specified disorders of nose and nasal sinuses: Secondary | ICD-10-CM | POA: Diagnosis not present

## 2019-12-17 DIAGNOSIS — J320 Chronic maxillary sinusitis: Secondary | ICD-10-CM | POA: Diagnosis not present

## 2019-12-18 ENCOUNTER — Other Ambulatory Visit (INDEPENDENT_AMBULATORY_CARE_PROVIDER_SITE_OTHER): Payer: BC Managed Care – PPO

## 2019-12-18 ENCOUNTER — Encounter: Payer: Self-pay | Admitting: Internal Medicine

## 2019-12-18 DIAGNOSIS — E538 Deficiency of other specified B group vitamins: Secondary | ICD-10-CM | POA: Diagnosis not present

## 2019-12-18 DIAGNOSIS — E559 Vitamin D deficiency, unspecified: Secondary | ICD-10-CM | POA: Diagnosis not present

## 2019-12-18 DIAGNOSIS — Z0000 Encounter for general adult medical examination without abnormal findings: Secondary | ICD-10-CM | POA: Diagnosis not present

## 2019-12-18 LAB — LIPID PANEL
Cholesterol: 223 mg/dL — ABNORMAL HIGH (ref 0–200)
HDL: 91.6 mg/dL (ref >39.00)
LDL Cholesterol: 111 mg/dL — ABNORMAL HIGH (ref 0–99)
NonHDL: 131.61
Total CHOL/HDL Ratio: 2
Triglycerides: 102 mg/dL (ref 0.0–149.0)
VLDL: 20.4 mg/dL (ref 0.0–40.0)

## 2019-12-18 LAB — CBC WITH DIFFERENTIAL/PLATELET
Basophils Absolute: 0 10*3/uL (ref 0.0–0.1)
Basophils Relative: 0.3 % (ref 0.0–3.0)
Eosinophils Absolute: 0 10*3/uL (ref 0.0–0.7)
Eosinophils Relative: 0.9 % (ref 0.0–5.0)
HCT: 37.7 % (ref 36.0–46.0)
Hemoglobin: 12.5 g/dL (ref 12.0–15.0)
Lymphocytes Relative: 40 % (ref 12.0–46.0)
Lymphs Abs: 2 10*3/uL (ref 0.7–4.0)
MCHC: 33.1 g/dL (ref 30.0–36.0)
MCV: 91.7 fl (ref 78.0–100.0)
Monocytes Absolute: 0.5 10*3/uL (ref 0.1–1.0)
Monocytes Relative: 9.1 % (ref 3.0–12.0)
Neutro Abs: 2.5 10*3/uL (ref 1.4–7.7)
Neutrophils Relative %: 49.7 % (ref 43.0–77.0)
Platelets: 358 10*3/uL (ref 150.0–400.0)
RBC: 4.12 Mil/uL (ref 3.87–5.11)
RDW: 14.3 % (ref 11.5–15.5)
WBC: 5.1 10*3/uL (ref 4.0–10.5)

## 2019-12-18 LAB — COMPREHENSIVE METABOLIC PANEL
ALT: 10 U/L (ref 0–35)
AST: 15 U/L (ref 0–37)
Albumin: 3.9 g/dL (ref 3.5–5.2)
Alkaline Phosphatase: 89 U/L (ref 39–117)
BUN: 11 mg/dL (ref 6–23)
CO2: 28 mEq/L (ref 19–32)
Calcium: 8.8 mg/dL (ref 8.4–10.5)
Chloride: 103 mEq/L (ref 96–112)
Creatinine, Ser: 0.63 mg/dL (ref 0.40–1.20)
GFR: 96.68 mL/min (ref >60.00)
Glucose, Bld: 82 mg/dL (ref 70–99)
Potassium: 3.9 mEq/L (ref 3.5–5.1)
Sodium: 140 mEq/L (ref 135–145)
Total Bilirubin: 0.3 mg/dL (ref 0.2–1.2)
Total Protein: 6.4 g/dL (ref 6.0–8.3)

## 2019-12-18 LAB — URINALYSIS, ROUTINE W REFLEX MICROSCOPIC
Bilirubin Urine: NEGATIVE
Hgb urine dipstick: NEGATIVE
Ketones, ur: NEGATIVE
Leukocytes,Ua: NEGATIVE
Nitrite: NEGATIVE
RBC / HPF: NONE SEEN (ref 0–?)
Specific Gravity, Urine: >=1.03 — AB (ref 1.000–1.030)
Total Protein, Urine: NEGATIVE
Urine Glucose: NEGATIVE
Urobilinogen, UA: 0.2 (ref 0.0–1.0)
pH: 5.5 (ref 5.0–8.0)

## 2019-12-18 LAB — VITAMIN B12: Vitamin B-12: 598 pg/mL (ref 211–911)

## 2019-12-18 LAB — VITAMIN D 25 HYDROXY (VIT D DEFICIENCY, FRACTURES): VITD: 40.07 ng/mL (ref 30.00–100.00)

## 2019-12-18 LAB — TSH: TSH: 0.64 u[IU]/mL (ref 0.35–4.50)

## 2019-12-20 ENCOUNTER — Encounter: Payer: Self-pay | Admitting: Internal Medicine

## 2019-12-20 ENCOUNTER — Ambulatory Visit (INDEPENDENT_AMBULATORY_CARE_PROVIDER_SITE_OTHER): Payer: BC Managed Care – PPO | Admitting: Internal Medicine

## 2019-12-20 ENCOUNTER — Other Ambulatory Visit: Payer: Self-pay

## 2019-12-20 VITALS — BP 130/80 | HR 81 | Temp 98.7°F | Ht 64.0 in | Wt 176.0 lb

## 2019-12-20 DIAGNOSIS — R030 Elevated blood-pressure reading, without diagnosis of hypertension: Secondary | ICD-10-CM | POA: Diagnosis not present

## 2019-12-20 DIAGNOSIS — E7849 Other hyperlipidemia: Secondary | ICD-10-CM

## 2019-12-20 DIAGNOSIS — Z23 Encounter for immunization: Secondary | ICD-10-CM

## 2019-12-20 DIAGNOSIS — G500 Trigeminal neuralgia: Secondary | ICD-10-CM | POA: Diagnosis not present

## 2019-12-20 DIAGNOSIS — F32A Depression, unspecified: Secondary | ICD-10-CM

## 2019-12-20 DIAGNOSIS — K219 Gastro-esophageal reflux disease without esophagitis: Secondary | ICD-10-CM

## 2019-12-20 DIAGNOSIS — Z0001 Encounter for general adult medical examination with abnormal findings: Secondary | ICD-10-CM

## 2019-12-20 DIAGNOSIS — E039 Hypothyroidism, unspecified: Secondary | ICD-10-CM

## 2019-12-20 DIAGNOSIS — Z0000 Encounter for general adult medical examination without abnormal findings: Secondary | ICD-10-CM | POA: Diagnosis not present

## 2019-12-20 NOTE — Telephone Encounter (Signed)
Patient's daughter called in response to the message from Dr Tamala Julian. She said that the surgeon pretty much told them it would take some time to heal. She feels like that this point there should be some improvement rather than feeling the way she did when she first injured it. At this point she is not able to do hardly anything because of the pain.  Please advise.  *Please call daughter, Hubert Azure: [phone]

## 2019-12-20 NOTE — Patient Instructions (Signed)
You had the Tdap tetanus shot today  You had the Shingles shot #1 today -  Please make an Nurse Visit appt in 2 months for the Shingles shot #2  We can refill the topamax, tegretol and gabapentin refills when needed since Dr Ellene Route is due to retire  Please continue all other medications as before, and refills have been done if requested.  Please have the pharmacy call with any other refills you may need.  Please continue your efforts at being more active, low cholesterol diet, and weight control  Please keep your appointments with your specialists as you may have planned - Gastroenterology  Please make an Appointment to return in 4 months, or sooner if needed

## 2019-12-20 NOTE — Progress Notes (Signed)
Subjective:    Patient ID: Kelli Bell, female    DOB: January 19, 1961, 59 y.o.   MRN: [id number]  HPI  Here for wellness and f/u;  Overall doing ok;  Pt denies Chest pain, worsening SOB, DOE, wheezing, orthopnea, PND, worsening LE edema, palpitations, dizziness or syncope.  Pt denies neurological change such as new headache, facial or extremity weakness.  Pt denies polydipsia, polyuria, or low sugar symptoms. Pt states overall good compliance with treatment and medications, good tolerability, and has been trying to follow appropriate diet.  Pt denies worsening depressive symptoms, suicidal ideation or panic. No fever, night sweats, wt loss, loss of appetite, or other constitutional symptoms.  Pt states good ability with ADL's, has low fall risk, home safety reviewed and adequate, no other significant changes in hearing or vision, and only occasionally active with exercise. Still with abd pain, now mostly lower, still has somewhat loose stools sp gatsric bypass, but no constipation, has not heard yet about the GI referral. Has gained some wt back as less active, though most recently has lost wt.  Wt Readings from Last 3 Encounters:  12/20/19 176 lb (79.8 kg)  11/28/19 183 lb (83 kg)  10/19/19 188 lb (85.3 kg)  Has f/u with ortho Dec 1, now 8 wks out from fall with humerus fx and right rot cuff tear that will eventually need repair.  Pain still about 8/10 and still struggling, seems to manage ok with ADLs though has to have some help, now living temporarily with mother.  Also, BP usually 130/80 or less at home.  Due for Tdap and shingles.  Asks that I take over refill lsfor topamax, tegretol and gabapentin as Dr Ellene Route is retiring  Denies hyper or hypo thyroid symptoms such as voice, skin or hair change.  Denies worsening reflux, abd pain, dysphagia, n/v, bowel change or blood. Past Medical History:  Diagnosis Date  . ANXIETY 03/30/2007  . BACK PAIN 02/20/2009  . Essential hypertension, benign 05/31/2012  .  Family history of anesthesia complication    "mother has hard time waking up"  . GERD 01/13/2007  . H/O hiatal hernia   . HYPERLIPIDEMIA 02/22/2008  . HYPOTHYROIDISM 03/30/2007  . MENOPAUSAL DISORDER 02/23/2008  . Nocturia   . OBSTRUCTIVE SLEEP APNEA 06/29/2007   has not used c pap x 8 months due to wt loss  . Osteoarthritis   . SINUSITIS- ACUTE-NOS 03/30/2007  . THORACIC/LUMBOSACRAL NEURITIS/RADICULITIS UNSPEC 02/23/2008  . Wheezing 01/08/2009   Past Surgical History:  Procedure Laterality Date  . ABDOMINAL HYSTERECTOMY  2004   with BSO  . BREATH TEK H PYLORI N/A 05/05/2013   Procedure: BREATH TEK H PYLORI;  Surgeon: Shann Medal, MD;  Location: [facility];  Service: General;  Laterality: N/A;  . CHOLECYSTECTOMY  1980  . GASTRIC ROUX-EN-Y N/A 09/12/2013   Procedure: LAPAROSCOPIC ROUX-EN-Y GASTRIC BYPASS WITH UPPER ENDOSCOPY WITH ENTEROLYSIS OF ADHESIONS;  Surgeon: Shann Medal, MD;  Location: [facility];  Service: General;  Laterality: N/A;  . jaw bone surgury     59 yo  . TONSILLECTOMY      reports that she quit smoking about 22 years ago. She has never used smokeless tobacco. She reports current alcohol use. She reports that she does not use drugs. family history includes Arthritis in an other family member; Asthma in her brother; Breast cancer in her maternal aunt; Cancer in her mother; Colon polyps (age of onset: 69) in her father; Diabetes in her mother and paternal grandmother;  Heart disease in her paternal grandmother; Hypertension in her mother; Lymphoma in her maternal grandmother; Osteoporosis in an other family member; Stroke in her mother; Thyroid disease in her maternal grandmother and mother. Allergies  Allergen Reactions  . Codeine Nausea Only   Current Outpatient Medications on File Prior to Visit  Medication Sig Dispense Refill  . acetaminophen (TYLENOL) 325 MG tablet Take 650 mg by mouth every 6 (six) hours as needed for mild pain or headache.    . allopurinol  (ZYLOPRIM) 100 MG tablet Take 2 tablets (200 mg total) by mouth daily. 60 tablet 6  . ALPRAZolam (XANAX) 0.25 MG tablet TAKE 1 TABLET BY MOUTH TWICE DAILY AS NEEDED FOR ANXIETY 40 tablet 0  . CALCIUM PO Take 5 mLs by mouth daily.    . carBAMazepine (TEGRETOL PO) Take 100 mg by mouth 2 (two) times daily.    . celecoxib (CELEBREX) 200 MG capsule Take 200 mg by mouth daily as needed.    . clobetasol (TEMOVATE) 0.05 % external solution     . colchicine 0.6 MG tablet colchicine 0.6 mg tablet    . Cyanocobalamin (B-12 PO) Take 5 mLs by mouth as needed (low b12).    . cyclobenzaprine (FLEXERIL) 10 MG tablet cyclobenzaprine 10 mg tablet    . escitalopram (LEXAPRO) 20 MG tablet escitalopram 20 mg tablet  TAKE 1 AND 1/2 TABLETS BY MOUTH DAILY    . gabapentin (NEURONTIN) 100 MG capsule TAKE 2 CAPSULES BY MOUTH AT BEDTIME 180 capsule 0  . HYDROcodone-acetaminophen (NORCO/VICODIN) 5-325 MG tablet Take 1 tablet by mouth every 8 (eight) hours as needed for severe pain. 15 tablet 0  . levothyroxine (SYNTHROID) 88 MCG tablet TAKE 1 TABLET BY MOUTH EVERY MORNING. 90 tablet 3  . Multiple Vitamins-Minerals (MULTIVITAMIN ADULT PO) Take 1 tablet by mouth daily.    Marland Kitchen nystatin-triamcinolone (MYCOLOG II) cream Apply 1 application topically as needed. 30 g 1  . omeprazole (PRILOSEC) 20 MG capsule TAKE 2 CAPSULES BY MOUTH DAILY. 180 capsule 1  . ondansetron (ZOFRAN) 4 MG tablet Take 4 mg by mouth every 8 (eight) hours as needed for nausea or vomiting.    . topiramate (TOPAMAX) 50 MG tablet Take 1 tablet by mouth daily.    . traMADol (ULTRAM) 50 MG tablet Take 1 tablet (50 mg total) by mouth every 6 (six) hours as needed. 60 tablet 3   No current facility-administered medications on file prior to visit.   Review of Systems All otherwise neg per pt    Objective:   Physical Exam BP 130/80   Pulse 81   Temp 98.7 F (37.1 C) (Oral)   Ht 5\' 4"  (1.626 m)   Wt 176 lb (79.8 kg)   SpO2 97%   BMI 30.21 kg/m  VS  noted,  Constitutional: Pt appears in NAD HENT: Head: NCAT.  Right Ear: External ear normal.  Left Ear: External ear normal.  Eyes: . Pupils are equal, round, and reactive to light. Conjunctivae and EOM are normal Nose: without d/c or deformity Neck: Neck supple. Gross normal ROM Cardiovascular: Normal rate and regular rhythm.   Pulmonary/Chest: Effort normal and breath sounds without rales or wheezing.  Abd:  Soft, NT, ND, + BS, no organomegaly Neurological: Pt is alert. At baseline orientation, motor grossly intact Skin: Skin is warm. No rashes, other new lesions, no LE edema Psychiatric: Pt behavior is normal without agitation  All otherwise neg per pt Lab Results  Component Value Date   WBC 5.1  12/18/2019   HGB 12.5 12/18/2019   HCT 37.7 12/18/2019   PLT 358.0 12/18/2019   GLUCOSE 82 12/18/2019   CHOL 223 (H) 12/18/2019   TRIG 102.0 12/18/2019   HDL 91.60 12/18/2019   LDLDIRECT 94.8 04/29/2012   LDLCALC 111 (H) 12/18/2019   ALT 10 12/18/2019   AST 15 12/18/2019   NA 140 12/18/2019   K 3.9 12/18/2019   CL 103 12/18/2019   CREATININE 0.63 12/18/2019   BUN 11 12/18/2019   CO2 28 12/18/2019   TSH 0.64 12/18/2019   HGBA1C 6.0 11/28/2019      Assessment & Plan:

## 2019-12-23 ENCOUNTER — Encounter: Payer: Self-pay | Admitting: Internal Medicine

## 2019-12-23 DIAGNOSIS — R030 Elevated blood-pressure reading, without diagnosis of hypertension: Secondary | ICD-10-CM | POA: Insufficient documentation

## 2019-12-23 NOTE — Assessment & Plan Note (Addendum)
Ok for med refills for pain, stable overall by history and exam, recent data reviewed with pt, and pt to continue medical treatment as before,  to f/u any worsening symptoms or concerns  I spent 31 minutes in addition to time for CPX wellness examination in preparing to see the patient by review of recent labs, imaging and procedures, obtaining and reviewing separately obtained history, communicating with the patient and family or caregiver, ordering medications, tests or procedures, and documenting clinical information in the EHR including the differential Dx, treatment, and any further evaluation and other management of trigeminal neuralgia, eleva bp without htn, gerd, depression, hld, hypothryoidsm

## 2019-12-23 NOTE — Assessment & Plan Note (Signed)
?   Mild situational worsening, declines change in tx at this time

## 2019-12-23 NOTE — Assessment & Plan Note (Signed)
Overall stable, to continue to monitor at home, check bp daily for 10 days and call with average

## 2019-12-23 NOTE — Assessment & Plan Note (Signed)
stable overall by history and exam, recent data reviewed with pt, and pt to continue medical treatment as before,  to f/u any worsening symptoms or concerns  

## 2019-12-23 NOTE — Assessment & Plan Note (Signed)

## 2019-12-25 ENCOUNTER — Other Ambulatory Visit (HOSPITAL_COMMUNITY): Payer: Self-pay | Admitting: Medical

## 2019-12-25 DIAGNOSIS — L639 Alopecia areata, unspecified: Secondary | ICD-10-CM | POA: Diagnosis not present

## 2019-12-25 MED FILL — CLOBETASOL 0.05% SOLUTION: 0.05 | 14 days supply | Qty: 50 | Fill #0

## 2019-12-27 ENCOUNTER — Other Ambulatory Visit: Payer: Self-pay

## 2019-12-27 DIAGNOSIS — M75111 Incomplete rotator cuff tear or rupture of right shoulder, not specified as traumatic: Secondary | ICD-10-CM | POA: Diagnosis not present

## 2019-12-27 DIAGNOSIS — E039 Hypothyroidism, unspecified: Secondary | ICD-10-CM

## 2019-12-29 ENCOUNTER — Other Ambulatory Visit (HOSPITAL_COMMUNITY): Payer: BC Managed Care – PPO

## 2019-12-29 ENCOUNTER — Other Ambulatory Visit: Payer: Self-pay | Admitting: Orthopedic Surgery

## 2019-12-29 ENCOUNTER — Encounter (HOSPITAL_BASED_OUTPATIENT_CLINIC_OR_DEPARTMENT_OTHER): Payer: Self-pay | Admitting: Orthopedic Surgery

## 2019-12-29 ENCOUNTER — Other Ambulatory Visit: Payer: Self-pay

## 2019-12-30 ENCOUNTER — Other Ambulatory Visit (HOSPITAL_COMMUNITY)
Admission: RE | Admit: 2019-12-30 | Discharge: 2019-12-30 | Disposition: A | Discharge status: Home / Self Care | Payer: BC Managed Care – PPO | Source: Ambulatory Visit | Attending: Orthopedic Surgery | Admitting: Orthopedic Surgery

## 2019-12-30 DIAGNOSIS — M129 Arthropathy, unspecified: Secondary | ICD-10-CM | POA: Diagnosis not present

## 2019-12-30 DIAGNOSIS — M75121 Complete rotator cuff tear or rupture of right shoulder, not specified as traumatic: Secondary | ICD-10-CM | POA: Diagnosis not present

## 2019-12-30 DIAGNOSIS — Z20822 Contact with and (suspected) exposure to covid-19: Secondary | ICD-10-CM | POA: Insufficient documentation

## 2019-12-30 DIAGNOSIS — Z791 Long term (current) use of non-steroidal anti-inflammatories (NSAID): Secondary | ICD-10-CM | POA: Diagnosis not present

## 2019-12-30 DIAGNOSIS — Z01812 Encounter for preprocedural laboratory examination: Secondary | ICD-10-CM | POA: Insufficient documentation

## 2019-12-30 DIAGNOSIS — S43491A Other sprain of right shoulder joint, initial encounter: Secondary | ICD-10-CM | POA: Diagnosis not present

## 2019-12-30 DIAGNOSIS — M7541 Impingement syndrome of right shoulder: Secondary | ICD-10-CM | POA: Diagnosis not present

## 2019-12-30 DIAGNOSIS — Z79890 Hormone replacement therapy (postmenopausal): Secondary | ICD-10-CM | POA: Diagnosis not present

## 2019-12-30 DIAGNOSIS — Z87891 Personal history of nicotine dependence: Secondary | ICD-10-CM | POA: Diagnosis not present

## 2019-12-30 DIAGNOSIS — Z79899 Other long term (current) drug therapy: Secondary | ICD-10-CM | POA: Diagnosis not present

## 2019-12-30 DIAGNOSIS — W19XXXA Unspecified fall, initial encounter: Secondary | ICD-10-CM | POA: Diagnosis not present

## 2019-12-31 LAB — SARS CORONAVIRUS 2 (TAT 6-24 HRS): SARS Coronavirus 2: NEGATIVE

## 2020-01-01 NOTE — Progress Notes (Signed)

## 2020-01-02 ENCOUNTER — Ambulatory Visit (HOSPITAL_BASED_OUTPATIENT_CLINIC_OR_DEPARTMENT_OTHER): Payer: BC Managed Care – PPO | Admitting: Anesthesiology

## 2020-01-02 ENCOUNTER — Encounter (HOSPITAL_BASED_OUTPATIENT_CLINIC_OR_DEPARTMENT_OTHER): Payer: Self-pay | Admitting: Orthopedic Surgery

## 2020-01-02 ENCOUNTER — Other Ambulatory Visit (HOSPITAL_BASED_OUTPATIENT_CLINIC_OR_DEPARTMENT_OTHER): Payer: Self-pay | Admitting: Surgical

## 2020-01-02 ENCOUNTER — Encounter (HOSPITAL_BASED_OUTPATIENT_CLINIC_OR_DEPARTMENT_OTHER)
Admission: RE | Disposition: A | Discharge status: Home / Self Care | Payer: Self-pay | Source: Home / Self Care | Attending: Orthopedic Surgery

## 2020-01-02 ENCOUNTER — Other Ambulatory Visit: Payer: Self-pay

## 2020-01-02 ENCOUNTER — Ambulatory Visit (HOSPITAL_BASED_OUTPATIENT_CLINIC_OR_DEPARTMENT_OTHER)
Admission: RE | Admit: 2020-01-02 | Discharge: 2020-01-02 | Disposition: A | Discharge status: Home / Self Care | Payer: BC Managed Care – PPO | Attending: Orthopedic Surgery | Admitting: Orthopedic Surgery

## 2020-01-02 DIAGNOSIS — M129 Arthropathy, unspecified: Secondary | ICD-10-CM | POA: Diagnosis not present

## 2020-01-02 DIAGNOSIS — Z87891 Personal history of nicotine dependence: Secondary | ICD-10-CM | POA: Diagnosis not present

## 2020-01-02 DIAGNOSIS — S43491A Other sprain of right shoulder joint, initial encounter: Secondary | ICD-10-CM | POA: Diagnosis not present

## 2020-01-02 DIAGNOSIS — Z20822 Contact with and (suspected) exposure to covid-19: Secondary | ICD-10-CM | POA: Insufficient documentation

## 2020-01-02 DIAGNOSIS — W19XXXA Unspecified fall, initial encounter: Secondary | ICD-10-CM | POA: Insufficient documentation

## 2020-01-02 DIAGNOSIS — S46011A Strain of muscle(s) and tendon(s) of the rotator cuff of right shoulder, initial encounter: Secondary | ICD-10-CM | POA: Diagnosis not present

## 2020-01-02 DIAGNOSIS — Z791 Long term (current) use of non-steroidal anti-inflammatories (NSAID): Secondary | ICD-10-CM | POA: Insufficient documentation

## 2020-01-02 DIAGNOSIS — Z79890 Hormone replacement therapy (postmenopausal): Secondary | ICD-10-CM | POA: Insufficient documentation

## 2020-01-02 DIAGNOSIS — Z79899 Other long term (current) drug therapy: Secondary | ICD-10-CM | POA: Insufficient documentation

## 2020-01-02 DIAGNOSIS — M7541 Impingement syndrome of right shoulder: Secondary | ICD-10-CM | POA: Insufficient documentation

## 2020-01-02 DIAGNOSIS — S42251A Displaced fracture of greater tuberosity of right humerus, initial encounter for closed fracture: Secondary | ICD-10-CM | POA: Diagnosis not present

## 2020-01-02 DIAGNOSIS — G8918 Other acute postprocedural pain: Secondary | ICD-10-CM | POA: Diagnosis not present

## 2020-01-02 DIAGNOSIS — M19011 Primary osteoarthritis, right shoulder: Secondary | ICD-10-CM | POA: Diagnosis not present

## 2020-01-02 DIAGNOSIS — M75121 Complete rotator cuff tear or rupture of right shoulder, not specified as traumatic: Secondary | ICD-10-CM | POA: Diagnosis not present

## 2020-01-02 HISTORY — PX: SHOULDER ARTHROSCOPY WITH ROTATOR CUFF REPAIR AND SUBACROMIAL DECOMPRESSION: SHX5686

## 2020-01-02 SURGERY — SHOULDER ARTHROSCOPY WITH ROTATOR CUFF REPAIR AND SUBACROMIAL DECOMPRESSION
Anesthesia: General | Laterality: Right

## 2020-01-02 MED ORDER — PHENYLEPHRINE HCL (PRESSORS) 10 MG/ML IV SOLN
INTRAVENOUS | Status: DC | PRN
  Administered 2020-01-02: 40 ug via INTRAVENOUS
  Administered 2020-01-02 (×3): 80 ug via INTRAVENOUS

## 2020-01-02 MED ORDER — ONDANSETRON HCL 4 MG/2ML IJ SOLN: 4.0000 mg | Freq: Once | INTRAMUSCULAR | Status: DC | PRN

## 2020-01-02 MED ORDER — PHENYLEPHRINE HCL (PRESSORS) 10 MG/ML IV SOLN
INTRAVENOUS | Status: AC
  Filled 2020-01-02: qty 1

## 2020-01-02 MED ORDER — CEFAZOLIN SODIUM-DEXTROSE 2-4 GM/100ML-% IV SOLN
INTRAVENOUS | Status: AC
  Filled 2020-01-02: qty 100

## 2020-01-02 MED ORDER — ONDANSETRON HCL 4 MG PO TABS: 4.0000 mg | ORAL_TABLET | Freq: Every day | ORAL | 1 refills | Status: DC | PRN

## 2020-01-02 MED ORDER — CEFAZOLIN SODIUM-DEXTROSE 2-4 GM/100ML-% IV SOLN
2.0000 g | INTRAVENOUS | Status: AC
  Administered 2020-01-02: 2 g via INTRAVENOUS

## 2020-01-02 MED ORDER — SODIUM CHLORIDE 0.9 % IR SOLN
Status: DC | PRN
  Administered 2020-01-02: 4000 mL

## 2020-01-02 MED ORDER — OXYCODONE HCL 5 MG/5ML PO SOLN: 5.0000 mg | Freq: Once | ORAL | Status: AC | PRN

## 2020-01-02 MED ORDER — GLYCOPYRROLATE 0.2 MG/ML IJ SOLN
INTRAMUSCULAR | Status: DC | PRN
  Administered 2020-01-02: .2 mg via INTRAVENOUS

## 2020-01-02 MED ORDER — ROCURONIUM BROMIDE 10 MG/ML (PF) SYRINGE
PREFILLED_SYRINGE | INTRAVENOUS | Status: AC
  Filled 2020-01-02: qty 10

## 2020-01-02 MED ORDER — BUPIVACAINE HCL (PF) 0.5 % IJ SOLN
INTRAMUSCULAR | Status: DC | PRN
  Administered 2020-01-02: 15 mL via PERINEURAL

## 2020-01-02 MED ORDER — TIZANIDINE HCL 4 MG PO TABS: 4.0000 mg | ORAL_TABLET | Freq: Three times a day (TID) | ORAL | 1 refills | Status: DC | PRN

## 2020-01-02 MED ORDER — LACTATED RINGERS IV SOLN: INTRAVENOUS | Status: DC

## 2020-01-02 MED ORDER — MIDAZOLAM HCL 5 MG/5ML IJ SOLN
INTRAMUSCULAR | Status: DC | PRN
  Administered 2020-01-02: 2 mg via INTRAVENOUS

## 2020-01-02 MED ORDER — ROCURONIUM BROMIDE 100 MG/10ML IV SOLN
INTRAVENOUS | Status: DC | PRN
  Administered 2020-01-02: 40 mg via INTRAVENOUS

## 2020-01-02 MED ORDER — FENTANYL CITRATE (PF) 100 MCG/2ML IJ SOLN
INTRAMUSCULAR | Status: AC
  Filled 2020-01-02: qty 2

## 2020-01-02 MED ORDER — BUPIVACAINE LIPOSOME 1.3 % IJ SUSP
INTRAMUSCULAR | Status: DC | PRN
  Administered 2020-01-02: 10 mL

## 2020-01-02 MED ORDER — SUGAMMADEX SODIUM 200 MG/2ML IV SOLN
INTRAVENOUS | Status: DC | PRN
  Administered 2020-01-02: 200 mg via INTRAVENOUS

## 2020-01-02 MED ORDER — DEXAMETHASONE SODIUM PHOSPHATE 10 MG/ML IJ SOLN
INTRAMUSCULAR | Status: AC
  Filled 2020-01-02: qty 1

## 2020-01-02 MED ORDER — PROPOFOL 10 MG/ML IV BOLUS
INTRAVENOUS | Status: DC | PRN
  Administered 2020-01-02: 200 mg via INTRAVENOUS

## 2020-01-02 MED ORDER — LIDOCAINE HCL (CARDIAC) PF 100 MG/5ML IV SOSY
PREFILLED_SYRINGE | INTRAVENOUS | Status: DC | PRN
  Administered 2020-01-02: 40 mg via INTRAVENOUS

## 2020-01-02 MED ORDER — OXYCODONE HCL 5 MG PO TABS
ORAL_TABLET | ORAL | Status: AC
  Filled 2020-01-02: qty 1

## 2020-01-02 MED ORDER — FENTANYL CITRATE (PF) 100 MCG/2ML IJ SOLN
50.0000 ug | Freq: Once | INTRAMUSCULAR | Status: AC
  Administered 2020-01-02: 50 ug via INTRAVENOUS

## 2020-01-02 MED ORDER — OXYCODONE-ACETAMINOPHEN 5-325 MG PO TABS
ORAL_TABLET | ORAL | 0 refills | Status: DC
Start: 2020-01-02 — End: 2020-02-22

## 2020-01-02 MED ORDER — PROPOFOL 10 MG/ML IV BOLUS
INTRAVENOUS | Status: AC
  Filled 2020-01-02: qty 40

## 2020-01-02 MED ORDER — MIDAZOLAM HCL 2 MG/2ML IJ SOLN
INTRAMUSCULAR | Status: AC
  Filled 2020-01-02: qty 2

## 2020-01-02 MED ORDER — MIDAZOLAM HCL 2 MG/2ML IJ SOLN
1.0000 mg | Freq: Once | INTRAMUSCULAR | Status: AC
  Administered 2020-01-02: 2 mg via INTRAVENOUS

## 2020-01-02 MED ORDER — EPHEDRINE SULFATE 50 MG/ML IJ SOLN
INTRAMUSCULAR | Status: DC | PRN
  Administered 2020-01-02: 10 mg via INTRAVENOUS
  Administered 2020-01-02 (×2): 20 mg via INTRAVENOUS

## 2020-01-02 MED ORDER — GLYCOPYRROLATE PF 0.2 MG/ML IJ SOSY
PREFILLED_SYRINGE | INTRAMUSCULAR | Status: AC
  Filled 2020-01-02: qty 1

## 2020-01-02 MED ORDER — SCOPOLAMINE 1 MG/3DAYS TD PT72
MEDICATED_PATCH | TRANSDERMAL | Status: AC
  Filled 2020-01-02: qty 1

## 2020-01-02 MED ORDER — OXYCODONE HCL 5 MG PO TABS
5.0000 mg | ORAL_TABLET | Freq: Once | ORAL | Status: AC | PRN
  Administered 2020-01-02: 5 mg via ORAL

## 2020-01-02 MED ORDER — EPHEDRINE 5 MG/ML INJ
INTRAVENOUS | Status: AC
  Filled 2020-01-02: qty 10

## 2020-01-02 MED ORDER — ONDANSETRON HCL 4 MG/2ML IJ SOLN
INTRAMUSCULAR | Status: AC
  Filled 2020-01-02: qty 2

## 2020-01-02 MED ORDER — AMISULPRIDE (ANTIEMETIC) 5 MG/2ML IV SOLN: 10.0000 mg | Freq: Once | INTRAVENOUS | Status: DC | PRN

## 2020-01-02 MED ORDER — SCOPOLAMINE 1 MG/3DAYS TD PT72
MEDICATED_PATCH | TRANSDERMAL | Status: DC | PRN
  Administered 2020-01-02: 1 via TRANSDERMAL

## 2020-01-02 MED ORDER — DEXAMETHASONE SODIUM PHOSPHATE 4 MG/ML IJ SOLN
INTRAMUSCULAR | Status: DC | PRN
  Administered 2020-01-02: 10 mg via INTRAVENOUS

## 2020-01-02 MED ORDER — FENTANYL CITRATE (PF) 100 MCG/2ML IJ SOLN: 25.0000 ug | INTRAMUSCULAR | Status: DC | PRN

## 2020-01-02 MED ORDER — SUCCINYLCHOLINE CHLORIDE 20 MG/ML IJ SOLN
INTRAMUSCULAR | Status: DC | PRN
  Administered 2020-01-02: 100 mg via INTRAVENOUS

## 2020-01-02 MED ORDER — FENTANYL CITRATE (PF) 100 MCG/2ML IJ SOLN
INTRAMUSCULAR | Status: DC | PRN
  Administered 2020-01-02: 25 ug via INTRAVENOUS

## 2020-01-02 MED FILL — ONDANSETRON HCL 4 MG TABS: 4 | 30 days supply | Qty: 30 | Fill #0

## 2020-01-02 MED FILL — tiZANidine HCL 4 MG TABS: 4 | 10 days supply | Qty: 30 | Fill #0

## 2020-01-02 MED FILL — OXYCODONE-APAP 5-325MG: 5-325 | 5 days supply | Qty: 30 | Fill #0

## 2020-01-02 SURGICAL SUPPLY — 60 items
AID PSTN UNV HD RSTRNT DISP (MISCELLANEOUS) ×1
ANCH SUT SWLK 19.1X4.75 VT (Anchor) ×3 IMPLANT
ANCHOR PEEK 4.75X19.1 SWLK C (Anchor) ×3 IMPLANT
APL PRP STRL LF DISP 70% ISPRP (MISCELLANEOUS) ×1
APL SKNCLS STERI-STRIP NONHPOA (GAUZE/BANDAGES/DRESSINGS)
BENZOIN TINCTURE PRP APPL 2/3 (GAUZE/BANDAGES/DRESSINGS) IMPLANT
BURR OVAL 8 FLU 4.0X13 (MISCELLANEOUS) ×2 IMPLANT
CANNULA 5.75X7 CRYSTAL CLEAR (CANNULA) ×2 IMPLANT
CANNULA TWIST IN 8.25X7CM (CANNULA) ×1 IMPLANT
CHLORAPREP W/TINT 26 (MISCELLANEOUS) ×2 IMPLANT
COVER WAND RF STERILE (DRAPES) IMPLANT
CUTTER BONE 4.0MM X 13CM (MISCELLANEOUS) ×2 IMPLANT
DECANTER SPIKE VIAL GLASS SM (MISCELLANEOUS) IMPLANT
DRAPE IMP U-DRAPE 54X76 (DRAPES) ×2 IMPLANT
DRAPE INCISE IOBAN 66X45 STRL (DRAPES) ×2 IMPLANT
DRAPE STERI 35X30 U-POUCH (DRAPES) ×2 IMPLANT
DRAPE SURG 17X23 STRL (DRAPES) ×2 IMPLANT
DRAPE U-SHAPE 47X51 STRL (DRAPES) ×2 IMPLANT
DRAPE U-SHAPE 76X120 STRL (DRAPES) ×4 IMPLANT
DRSG PAD ABDOMINAL 8X10 ST (GAUZE/BANDAGES/DRESSINGS) ×2 IMPLANT
ELECT REM PT RETURN 9FT ADLT (ELECTROSURGICAL) ×2
ELECTRODE REM PT RTRN 9FT ADLT (ELECTROSURGICAL) ×1 IMPLANT
GAUZE 4X4 16PLY RFD (DISPOSABLE) IMPLANT
GAUZE SPONGE 4X4 12PLY STRL (GAUZE/BANDAGES/DRESSINGS) ×2 IMPLANT
GAUZE XEROFORM 1X8 LF (GAUZE/BANDAGES/DRESSINGS) ×2 IMPLANT
GLOVE BIO SURGEON STRL SZ7 (GLOVE) ×2 IMPLANT
GLOVE BIO SURGEON STRL SZ7.5 (GLOVE) ×2 IMPLANT
GLOVE BIOGEL PI IND STRL 7.0 (GLOVE) ×1 IMPLANT
GLOVE BIOGEL PI INDICATOR 7.0 (GLOVE) ×1
GLOVE SRG 8 PF TXTR STRL LF DI (GLOVE) ×1 IMPLANT
GLOVE SURG UNDER POLY LF SZ8 (GLOVE) ×2
GOWN STRL REUS W/ TWL LRG LVL3 (GOWN DISPOSABLE) ×2 IMPLANT
GOWN STRL REUS W/ TWL XL LVL3 (GOWN DISPOSABLE) ×1 IMPLANT
GOWN STRL REUS W/TWL LRG LVL3 (GOWN DISPOSABLE) ×4
GOWN STRL REUS W/TWL XL LVL3 (GOWN DISPOSABLE) ×2
LASSO CRESCENT QUICKPASS (SUTURE) IMPLANT
MANIFOLD NEPTUNE II (INSTRUMENTS) ×2 IMPLANT
NDL SCORPION MULTI FIRE (NEEDLE) IMPLANT
NEEDLE SCORPION MULTI FIRE (NEEDLE) ×2 IMPLANT
NS IRRIG 1000ML POUR BTL (IV SOLUTION) IMPLANT
PACK ARTHROSCOPY DSU (CUSTOM PROCEDURE TRAY) ×2 IMPLANT
PACK BASIN DAY SURGERY FS (CUSTOM PROCEDURE TRAY) ×2 IMPLANT
PROBE BIPOLAR ATHRO 135MM 90D (MISCELLANEOUS) ×2 IMPLANT
RESTRAINT HEAD UNIVERSAL NS (MISCELLANEOUS) ×2 IMPLANT
SLEEVE SCD COMPRESS KNEE MED (MISCELLANEOUS) ×2 IMPLANT
SLING ARM FOAM STRAP LRG (SOFTGOODS) IMPLANT
STRIP CLOSURE SKIN 1/2X4 (GAUZE/BANDAGES/DRESSINGS) IMPLANT
SUPPORT WRAP ARM LG (MISCELLANEOUS) IMPLANT
SUT ETHILON 3 0 PS 1 (SUTURE) ×2 IMPLANT
SUT FIBERWIRE #2 38 T-5 BLUE (SUTURE)
SUT PDS AB 0 CT 36 (SUTURE) IMPLANT
SUT TIGER TAPE 7 IN WHITE (SUTURE) IMPLANT
SUTURE FIBERWR #2 38 T-5 BLUE (SUTURE) IMPLANT
SUTURE TAPE 1.3 40 TPR END (SUTURE) IMPLANT
SUTURETAPE 1.3 40 TPR END (SUTURE)
TAPE FIBER 2MM 7IN #2 BLUE (SUTURE) ×1 IMPLANT
TOWEL GREEN STERILE FF (TOWEL DISPOSABLE) ×2 IMPLANT
TUBE CONNECTING 20X1/4 (TUBING) ×2 IMPLANT
TUBING ARTHROSCOPY IRRIG 16FT (MISCELLANEOUS) ×2 IMPLANT
WATER STERILE IRR 1000ML POUR (IV SOLUTION) ×2 IMPLANT

## 2020-01-02 NOTE — H&P (Signed)
Kelli Bell is an 59 y.o. female.   Chief Complaint: R shoulder pain HPI: s/p nondisplaced tuberosity fracture.  Continued pain with partial rotator cuff tear, failed conservative treatment.  Past Medical History:  Diagnosis Date  . ANXIETY 03/30/2007  . BACK PAIN 02/20/2009  . Essential hypertension, benign 05/31/2012  . Family history of anesthesia complication    "mother has hard time waking up"  . GERD 01/13/2007  . H/O hiatal hernia   . HYPERLIPIDEMIA 02/22/2008  . HYPOTHYROIDISM 03/30/2007  . MENOPAUSAL DISORDER 02/23/2008  . Nocturia   . OBSTRUCTIVE SLEEP APNEA 06/29/2007   has not used c pap x 8 months due to wt loss  . Osteoarthritis   . SINUSITIS- ACUTE-NOS 03/30/2007  . THORACIC/LUMBOSACRAL NEURITIS/RADICULITIS UNSPEC 02/23/2008  . Wheezing 01/08/2009    Past Surgical History:  Procedure Laterality Date  . ABDOMINAL HYSTERECTOMY  2004   with BSO  . BREATH TEK H PYLORI N/A 05/05/2013   Procedure: BREATH TEK H PYLORI;  Surgeon: Shann Medal, MD;  Location: [facility];  Service: General;  Laterality: N/A;  . CHOLECYSTECTOMY  1980  . GASTRIC ROUX-EN-Y N/A 09/12/2013   Procedure: LAPAROSCOPIC ROUX-EN-Y GASTRIC BYPASS WITH UPPER ENDOSCOPY WITH ENTEROLYSIS OF ADHESIONS;  Surgeon: Shann Medal, MD;  Location: [facility];  Service: General;  Laterality: N/A;  . jaw bone surgury     59 yo  . TONSILLECTOMY      Family History  Problem Relation Age of Onset  . Stroke Mother   . Hypertension Mother   . Diabetes Mother   . Cancer Mother        basel cell cancer  . Thyroid disease Mother   . Arthritis Other   . Colon polyps Father 26       "CANCEROUS POLYPS REMOVED THIS YEAR"  . Asthma Brother   . Thyroid disease Maternal Grandmother   . Lymphoma Maternal Grandmother   . Osteoporosis Other   . Breast cancer Maternal Aunt        mat great aunt  . Diabetes Paternal Grandmother   . Heart disease Paternal Grandmother   . Colon cancer Neg Hx    Social History:  reports that she  quit smoking about 22 years ago. She has never used smokeless tobacco. She reports current alcohol use. She reports that she does not use drugs.  Allergies:  Allergies  Allergen Reactions  . Codeine Nausea Only    Medications Prior to Admission  Medication Sig Dispense Refill  . acetaminophen (TYLENOL) 325 MG tablet Take 650 mg by mouth every 6 (six) hours as needed for mild pain or headache.    . allopurinol (ZYLOPRIM) 100 MG tablet Take 2 tablets (200 mg total) by mouth daily. 60 tablet 6  . ALPRAZolam (XANAX) 0.25 MG tablet TAKE 1 TABLET BY MOUTH TWICE DAILY AS NEEDED FOR ANXIETY 40 tablet 0  . CALCIUM PO Take 5 mLs by mouth daily.    . carBAMazepine (TEGRETOL PO) Take 100 mg by mouth 2 (two) times daily.    . celecoxib (CELEBREX) 200 MG capsule Take 200 mg by mouth daily as needed.    . clobetasol (TEMOVATE) 0.05 % external solution     . Cyanocobalamin (B-12 PO) Take 5 mLs by mouth as needed (low b12).    . gabapentin (NEURONTIN) 100 MG capsule TAKE 2 CAPSULES BY MOUTH AT BEDTIME 180 capsule 0  . levothyroxine (SYNTHROID) 88 MCG tablet TAKE 1 TABLET BY MOUTH EVERY MORNING. 90 tablet 3  .  Multiple Vitamins-Minerals (MULTIVITAMIN ADULT PO) Take 1 tablet by mouth daily.    Marland Kitchen nystatin-triamcinolone (MYCOLOG II) cream Apply 1 application topically as needed. 30 g 1  . omeprazole (PRILOSEC) 20 MG capsule TAKE 2 CAPSULES BY MOUTH DAILY. 180 capsule 1  . ondansetron (ZOFRAN) 4 MG tablet Take 4 mg by mouth every 8 (eight) hours as needed for nausea or vomiting.    . topiramate (TOPAMAX) 50 MG tablet Take 1 tablet by mouth daily.    . colchicine 0.6 MG tablet colchicine 0.6 mg tablet    . cyclobenzaprine (FLEXERIL) 10 MG tablet cyclobenzaprine 10 mg tablet    . HYDROcodone-acetaminophen (NORCO/VICODIN) 5-325 MG tablet Take 1 tablet by mouth every 8 (eight) hours as needed for severe pain. 15 tablet 0  . traMADol (ULTRAM) 50 MG tablet Take 1 tablet (50 mg total) by mouth every 6 (six) hours as  needed. 60 tablet 3    No results found for this or any previous visit (from the past 48 hour(s)). No results found.  Review of Systems  All other systems reviewed and are negative.   Blood pressure 111/66, pulse (!) 54, temperature 97.9 F (36.6 C), temperature source Oral, resp. rate 16, height 5\' 4"  (1.626 m), weight 84.6 kg, SpO2 100 %. Physical Exam HENT:     Head: Atraumatic.  Eyes:     Extraocular Movements: Extraocular movements intact.  Cardiovascular:     Pulses: Normal pulses.  Musculoskeletal:     Comments: R shoulder pain with limited ROM.  Skin:    General: Skin is warm.  Neurological:     Mental Status: She is alert.      Assessment/Plan s/p nondisplaced tuberosity fracture.  Continued pain with partial rotator cuff tear, failed conservative treatment. Plan R shoulder arthroscopy with debridement vs repair, sad, dce. Risks / benefits of surgery discussed Consent on chart  NPO for OR Preop antibiotics   Isabella Stalling, MD 01/02/2020, 12:15 PM

## 2020-01-02 NOTE — Progress Notes (Signed)
Assisted Dr. Christella Hartigan with right, ultrasound guided, interscalene  block. Side rails up, monitors on throughout procedure. See vital signs in flow sheet. Tolerated Procedure well.

## 2020-01-02 NOTE — Anesthesia Preprocedure Evaluation (Addendum)
Anesthesia Evaluation  Patient identified by MRN, date of birth, ID band Patient awake    Reviewed: Allergy & Precautions, NPO status , Patient's Chart, lab work & pertinent test results  History of Anesthesia Complications Negative for: history of anesthetic complications  Airway Mallampati: II  TM Distance: >3 FB Neck ROM: Full    Dental   Pulmonary sleep apnea , former smoker,    Pulmonary exam normal        Cardiovascular hypertension, Normal cardiovascular exam     Neuro/Psych Anxiety Depression negative neurological ROS     GI/Hepatic Neg liver ROS, hiatal hernia, GERD  ,  Endo/Other  Hypothyroidism   Renal/GU negative Renal ROS  negative genitourinary   Musculoskeletal  (+) Arthritis ,   Abdominal   Peds  Hematology negative hematology ROS (+)   Anesthesia Other Findings   Reproductive/Obstetrics                            Anesthesia Physical Anesthesia Plan  ASA: II  Anesthesia Plan: General   Post-op Pain Management: GA combined w/ Regional for post-op pain   Induction: Intravenous  PONV Risk Score and Plan: 3 and Ondansetron, Dexamethasone, Treatment may vary due to age or medical condition and Midazolam  Airway Management Planned: Oral ETT  Additional Equipment: None  Intra-op Plan:   Post-operative Plan: Extubation in OR  Informed Consent: I have reviewed the patients History and Physical, chart, labs and discussed the procedure including the risks, benefits and alternatives for the proposed anesthesia with the patient or authorized representative who has indicated his/her understanding and acceptance.     Dental advisory given  Plan Discussed with:   Anesthesia Plan Comments:        Anesthesia Quick Evaluation

## 2020-01-02 NOTE — Discharge Instructions (Signed)
Discharge Instructions after Arthroscopic Shoulder Repair   A sling has been provided for you. Remain in your sling at all times. This includes sleeping in your sling.  Use ice on the shoulder intermittently over the first 48 hours after surgery.  Pain medicine has been prescribed for you.  Use your medicine liberally over the first 48 hours, and then you can begin to taper your use. You may take Extra Strength Tylenol or Tylenol only in place of the pain pills. DO NOT take ANY nonsteroidal anti-inflammatory pain medications: Advil, Motrin, Ibuprofen, Aleve, Naproxen, or Narprosyn.  You may remove your dressing after two days. If the incision sites are still moist, place a Band-Aid over the moist site(s). Change Band-Aids daily until dry.  You may shower 5 days after surgery. The incisions CANNOT get wet prior to 5 days. Simply allow the water to wash over the site and then pat dry. Do not rub the incisions. Make sure your axilla (armpit) is completely dry after showering.  Take one aspirin a day for 2 weeks after surgery, unless you have an aspirin sensitivity/ allergy or asthma.   Please call [phone] during normal business hours or [phone] after hours for any problems. Including the following:  - excessive redness of the incisions - drainage for more than 4 days - fever of more than 101.5 F  *Please note that pain medications will not be refilled after hours or on weekends.   Post Anesthesia Home Care Instructions  Activity: Get plenty of rest for the remainder of the day. A responsible individual must stay with you for 24 hours following the procedure.  For the next 24 hours, DO NOT: -Drive a car -Paediatric nurse -Drink alcoholic beverages -Take any medication unless instructed by your physician -Make any legal decisions or sign important papers.  Meals: Start with liquid foods such as gelatin or soup. Progress to regular foods as tolerated. Avoid greasy, spicy, heavy  foods. If nausea and/or vomiting occur, drink only clear liquids until the nausea and/or vomiting subsides. Call your physician if vomiting continues.  Special Instructions/Symptoms: Your throat may feel dry or sore from the anesthesia or the breathing tube placed in your throat during surgery. If this causes discomfort, gargle with warm salt water. The discomfort should disappear within 24 hours.  If you had a scopolamine patch placed behind your ear for the management of post- operative nausea and/or vomiting:  1. The medication in the patch is effective for 72 hours, after which it should be removed.  Wrap patch in a tissue and discard in the trash. Wash hands thoroughly with soap and water. 2. You may remove the patch earlier than 72 hours if you experience unpleasant side effects which may include dry mouth, dizziness or visual disturbances. 3. Avoid touching the patch. Wash your hands with soap and water after contact with the patch.    Information for Discharge Teaching: EXPAREL (bupivacaine liposome injectable suspension)   Your surgeon or anesthesiologist gave you EXPAREL(bupivacaine) to help control your pain after surgery.   EXPAREL is a local anesthetic that provides pain relief by numbing the tissue around the surgical site.  EXPAREL is designed to release pain medication over time and can control pain for up to 72 hours.  Depending on how you respond to EXPAREL, you may require less pain medication during your recovery.  Possible side effects:  Temporary loss of sensation or ability to move in the area where bupivacaine was injected.  Nausea, vomiting, constipation  Rarely, numbness and tingling in your mouth or lips, lightheadedness, or anxiety may occur.  Call your doctor right away if you think you may be experiencing any of these sensations, or if you have other questions regarding possible side effects.  Follow all other discharge instructions given to you by your  surgeon or nurse. Eat a healthy diet and drink plenty of water or other fluids.  If you return to the hospital for any reason within 96 hours following the administration of EXPAREL, it is important for health care providers to know that you have received this anesthetic. A teal colored band has been placed on your arm with the date, time and amount of EXPAREL you have received in order to alert and inform your health care providers. Please leave this armband in place for the full 96 hours following administration, and then you may remove the band.Regional Anesthesia Blocks  1. Numbness or the inability to move the "blocked" extremity may last from 3-48 hours after placement. The length of time depends on the medication injected and your individual response to the medication. If the numbness is not going away after 48 hours, call your surgeon.  2. The extremity that is blocked will need to be protected until the numbness is gone and the  Strength has returned. Because you cannot feel it, you will need to take extra care to avoid injury. Because it may be weak, you may have difficulty moving it or using it. You may not know what position it is in without looking at it while the block is in effect.  3. For blocks in the legs and feet, returning to weight bearing and walking needs to be done carefully. You will need to wait until the numbness is entirely gone and the strength has returned. You should be able to move your leg and foot normally before you try and bear weight or walk. You will need someone to be with you when you first try to ensure you do not fall and possibly risk injury.  4. Bruising and tenderness at the needle site are common side effects and will resolve in a few days.  5. Persistent numbness or new problems with movement should be communicated to the surgeon or the [facility] [phone] [place] [phone]).

## 2020-01-02 NOTE — Anesthesia Procedure Notes (Signed)
Anesthesia Regional Block: Interscalene brachial plexus block   Pre-Anesthetic Checklist: ,, timeout performed, Correct Patient, Correct Site, Correct Laterality, Correct Procedure, Correct Position, site marked, Risks and benefits discussed,  Surgical consent,  Pre-op evaluation,  At surgeon's request and post-op pain management  Laterality: Right  Prep: chloraprep       Needles:  Injection technique: Single-shot  Needle Type: Echogenic Stimulator Needle     Needle Length: 10cm  Needle Gauge: 20     Additional Needles:   Procedures:,,,, ultrasound used (permanent image in chart),,,,  Narrative:  Start time: 01/02/2020 11:43 AM End time: 01/02/2020 11:47 AM Injection made incrementally with aspirations every 5 mL.  Performed by: Personally  Anesthesiologist: Lidia Collum, MD  Additional Notes: Standard monitors applied. Skin prepped. Good needle visualization with ultrasound. Injection made in 5cc increments with no resistance to injection. Patient tolerated the procedure well.

## 2020-01-02 NOTE — Anesthesia Procedure Notes (Signed)
Procedure Name: Intubation Date/Time: 01/02/2020 12:39 PM Performed by: Justice Rocher, CRNA Pre-anesthesia Checklist: Patient identified, Emergency Drugs available, Suction available, Patient being monitored and Timeout performed Patient Re-evaluated:Patient Re-evaluated prior to induction Oxygen Delivery Method: Circle system utilized Preoxygenation: Pre-oxygenation with 100% oxygen Induction Type: IV induction Ventilation: Mask ventilation without difficulty Laryngoscope Size: Mac and 3 Grade View: Grade II Tube type: Oral Tube size: 7.5 mm Number of attempts: 1 Airway Equipment and Method: Stylet and Oral airway Placement Confirmation: ETT inserted through vocal cords under direct vision,  positive ETCO2,  breath sounds checked- equal and bilateral and CO2 detector Secured at: 23 cm Tube secured with: Tape Dental Injury: Teeth and Oropharynx as per pre-operative assessment

## 2020-01-02 NOTE — Transfer of Care (Signed)
Immediate Anesthesia Transfer of Care Note  Patient: Kelli Bell  Procedure(s) Performed: Procedure(s) (LRB): SHOULDER ARTHROSCOPY WITH ROTATOR CUFF REPAIR , SUBACROMIAL DECOMPRESSION,DISTAL CLAVICLE EXICISION (Right)  Patient Location: PACU  Anesthesia Type: General  Level of Consciousness: awake, sedated, patient cooperative and responds to stimulation  Airway & Oxygen Therapy: Patient Spontanous Breathing and Patient connected to Sherrill 02 and soft FM   Post-op Assessment: Report given to PACU RN, Post -op Vital signs reviewed and stable and Patient moving all extremities  Post vital signs: Reviewed and stable  Complications: No apparent anesthesia complications

## 2020-01-02 NOTE — Op Note (Signed)
Procedure(s): SHOULDER ARTHROSCOPY WITH ROTATOR CUFF REPAIR , SUBACROMIAL DECOMPRESSION,DISTAL CLAVICLE EXICISION Procedure Note  Loie Jahr female 59 y.o. 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  Postoperative diagnosis: Same  Procedure(s) and Anesthesia Type:    * SHOULDER ARTHROSCOPY WITH ROTATOR CUFF REPAIR , SUBACROMIAL DECOMPRESSION,DISTAL CLAVICLE EXICISION - Choice  Surgeon(s) and Role:    Tania Ade, MD - Primary     Surgeon: Isabella Stalling   Assistants: Jeanmarie Hubert PA-C (Danielle was present and scrubbed throughout the procedure and was essential in positioning, assisting with the camera and instrumentation,, and closure)  Anesthesia: General endotracheal anesthesia with preoperative interscalene block given by the attending anesthesiologist     Procedure Detail  SHOULDER ARTHROSCOPY WITH ROTATOR CUFF REPAIR , SUBACROMIAL DECOMPRESSION,DISTAL CLAVICLE EXICISION  Estimated Blood Loss: Min         Drains: none  Blood Given: none         Specimens: none        Complications:  * No complications entered in OR log *         Disposition: PACU - hemodynamically stable.         Condition: stable    Procedure:   INDICATIONS FOR SURGERY: The patient is 59 y.o. female who has had a long history of right shoulder pain which recently got acutely worse after a fall.  She was diagnosed with a nondisplaced greater tuberosity fracture with an associated rotator cuff tear.  She was treated conservatively for over 8 weeks but had continued severe pain and ultimately was indicated for surgery to repair the rotator cuff tear.  She also had associated impingement and AC joint arthropathy.  OPERATIVE FINDINGS: Examination under anesthesia: No significant stiffness or  instability.   DESCRIPTION OF PROCEDURE: The patient was identified in preoperative  holding area where I personally marked the operative site after  verifying site, side, and procedure with the patient. An interscalene block was given by the attending anesthesiologist the holding area.  The patient was taken back to the operating room where general anesthesia was induced without complication and was placed in the beach-chair position with the back  elevated about 60 degrees and all extremities and head and neck carefully padded and  positioned.   The right upper extremity was then prepped and  draped in a standard sterile fashion. The appropriate time-out  procedure was carried out. The patient did receive IV antibiotics  within 30 minutes of incision.   A small posterior portal incision was made and the arthroscope was introduced into the joint. An anterior portal was then established above the subscapularis using needle localization. Small cannula was placed anteriorly. Diagnostic arthroscopy was then carried out  She had some degenerative tearing of the superior and anterior labrum which was debrided back to healthy labrum.  The biceps tendon looked healthy.  The subscapularis was intact.  Supraspinatus was noted to be completely torn and there was actually a small fragment of bone which had avulsed from the tuberosity still associated with the supraspinatus tendon.  This was lightly debrided from the undersurface.  The arthroscope was then introduced into the subacromial space a standard lateral portal was established with needle localization. The shaver was used through the lateral portal to perform extensive bursectomy. Coracoacromial ligament was examined and found to be severely frayed indicating chronic impingement.  The bursal side of the rotator cuff tear was identified.  It was indeed a full-thickness tear.  The large cannula was placed laterally and the camera was moved to a  posterior lateral viewing portal.  Again there was a small fragment of bone associated with the tear.  This was debrided back to a bleeding surface.  The tuberosity was overall stable and appeared mostly healed.  The decision was made to carry out the repair by first placing a 4.75 peek swivel lock anchor preloaded with 2 fiber tapes central in the tear just off the articular margin.  These 4 strands were then passed evenly throughout the tear medial to the small bony fragment.  These were then brought over into 2 additional 4.75 peek swivel lock anchors bringing the tendon and the bony fragment nicely down over the prepared tuberosity.  Repair was felt to be even and watertight.  Bone quality was fair.  The coracoacromial ligament was taken down off the anterior acromion with the ArthroCare exposing a moderate hooked anterior acromial spur. A high-speed bur was then used through the lateral portal to take down the anterior acromial spur from lateral to medial in a standard acromioplasty.  The acromioplasty was also viewed from the lateral portal and the bur was used as necessary to ensure that the acromion was completely flat from posterior to anterior.  The distal clavicle was exposed arthroscopically and the bur was used to take off the undersurface for approximately 8 mm from the lateral portal. The bur was then moved to an anterior portal position to complete the distal clavicle excision resecting about 8 mm of the distal clavicle and a smooth even fashion. This was viewed from anterior and lateral portals and felt to be complete.  The arthroscopic equipment was removed from the joint and the portals were closed with 3-0 nylon in an interrupted fashion. Sterile dressings were then applied including Xeroform 4 x 4's ABDs and tape. The patient was then allowed to awaken from general anesthesia, placed in a sling, transferred to the stretcher and taken to the recovery room in stable  condition.   POSTOPERATIVE PLAN: The patient will be discharged home today and will followup in one week for suture removal and wound check.

## 2020-01-03 ENCOUNTER — Encounter (HOSPITAL_BASED_OUTPATIENT_CLINIC_OR_DEPARTMENT_OTHER): Payer: Self-pay | Admitting: Orthopedic Surgery

## 2020-01-03 NOTE — Anesthesia Postprocedure Evaluation (Signed)
Anesthesia Post Note  Patient: Xena Propst  Procedure(s) Performed: SHOULDER ARTHROSCOPY WITH ROTATOR CUFF REPAIR , SUBACROMIAL DECOMPRESSION,DISTAL CLAVICLE EXICISION (Right )     Patient location during evaluation: PACU Anesthesia Type: General and Regional Level of consciousness: sedated Pain management: pain level controlled Vital Signs Assessment: post-procedure vital signs reviewed and stable Respiratory status: spontaneous breathing and respiratory function stable Cardiovascular status: stable Postop Assessment: no apparent nausea or vomiting Anesthetic complications: no   No complications documented.  Last Vitals:  Vitals:   01/02/20 1500 01/02/20 1530  BP: 119/70 129/83  Pulse: 62 61  Resp: 19 16  Temp:  36.5 C  SpO2: 99% 97%    Last Pain:  Vitals:   01/02/20 1530  TempSrc:   PainSc: 0-No pain                 Merlinda Frederick

## 2020-01-04 ENCOUNTER — Telehealth: Payer: Self-pay | Admitting: Internal Medicine

## 2020-01-04 ENCOUNTER — Other Ambulatory Visit: Payer: Self-pay

## 2020-01-04 DIAGNOSIS — E039 Hypothyroidism, unspecified: Secondary | ICD-10-CM

## 2020-01-04 MED ORDER — LEVOTHYROXINE SODIUM 88 MCG PO TABS: 88.0000 ug | ORAL_TABLET | Freq: Every morning | ORAL | 3 refills | Status: DC

## 2020-01-04 MED FILL — LEVOTHYROXINE 88 MCG TABLET: 88 | 90 days supply | Qty: 90 | Fill #0

## 2020-01-04 NOTE — Telephone Encounter (Signed)
° ° ° °  1.Medication Requested:levothyroxine (SYNTHROID) 88 MCG tablet    2. Pharmacy (Name, Street, [place]):[facility], [place]  3. On Med List: yes   4. Last Visit with PCP: 11.24.2021  5. Next visit date with PCP: 3.24.2022   Agent: Please be advised that RX refills may take up to 3 business days. We ask that you follow-up with your pharmacy.

## 2020-01-11 MED FILL — tiZANidine HCL 4 MG TABS: 4 | 10 days supply | Qty: 30 | Fill #1

## 2020-01-12 DIAGNOSIS — Z9889 Other specified postprocedural states: Secondary | ICD-10-CM | POA: Diagnosis not present

## 2020-01-15 ENCOUNTER — Other Ambulatory Visit: Payer: Self-pay | Admitting: Internal Medicine

## 2020-01-15 ENCOUNTER — Ambulatory Visit: Payer: BC Managed Care – PPO

## 2020-01-15 MED FILL — ALPRAZolam 0.25 MG TABS: 0.25 | 30 days supply | Qty: 60 | Fill #0

## 2020-01-17 DIAGNOSIS — M75121 Complete rotator cuff tear or rupture of right shoulder, not specified as traumatic: Secondary | ICD-10-CM | POA: Diagnosis not present

## 2020-01-22 DIAGNOSIS — L639 Alopecia areata, unspecified: Secondary | ICD-10-CM | POA: Diagnosis not present

## 2020-01-25 DIAGNOSIS — M75121 Complete rotator cuff tear or rupture of right shoulder, not specified as traumatic: Secondary | ICD-10-CM | POA: Diagnosis not present

## 2020-01-29 ENCOUNTER — Other Ambulatory Visit: Payer: Self-pay | Admitting: Internal Medicine

## 2020-01-29 ENCOUNTER — Other Ambulatory Visit: Payer: Self-pay | Admitting: Family Medicine

## 2020-01-29 DIAGNOSIS — K2100 Gastro-esophageal reflux disease with esophagitis, without bleeding: Secondary | ICD-10-CM

## 2020-01-29 MED FILL — carBAMazepine ER 100 MG TB1: 100 | 30 days supply | Qty: 120 | Fill #3

## 2020-01-29 MED FILL — TOPIRAMATE 50 MG TABLET: 50 | 30 days supply | Qty: 30 | Fill #3

## 2020-01-29 MED FILL — ALLOPURINOL 100 MG TABS: 100 | 30 days supply | Qty: 60 | Fill #0

## 2020-01-29 NOTE — Telephone Encounter (Signed)
Refill done.  

## 2020-01-29 NOTE — Telephone Encounter (Signed)
Please refill as per office routine med refill policy (all routine meds refilled for 3 mo or monthly per pt preference up to one year from last visit, then month to month grace period for 3 mo, then further med refills will have to be denied)  

## 2020-01-30 MED FILL — OMEPRAZOLE 20 MG CAP: 20 | 90 days supply | Qty: 180 | Fill #0

## 2020-02-02 DIAGNOSIS — M75121 Complete rotator cuff tear or rupture of right shoulder, not specified as traumatic: Secondary | ICD-10-CM | POA: Diagnosis not present

## 2020-02-06 DIAGNOSIS — M75121 Complete rotator cuff tear or rupture of right shoulder, not specified as traumatic: Secondary | ICD-10-CM | POA: Diagnosis not present

## 2020-02-09 DIAGNOSIS — M75121 Complete rotator cuff tear or rupture of right shoulder, not specified as traumatic: Secondary | ICD-10-CM | POA: Diagnosis not present

## 2020-02-19 ENCOUNTER — Other Ambulatory Visit: Payer: Self-pay

## 2020-02-19 ENCOUNTER — Ambulatory Visit (INDEPENDENT_AMBULATORY_CARE_PROVIDER_SITE_OTHER): Payer: 59

## 2020-02-19 DIAGNOSIS — Z23 Encounter for immunization: Secondary | ICD-10-CM | POA: Diagnosis not present

## 2020-02-19 MED FILL — ONDANSETRON HCL 4 MG TABS: 4 | 21 days supply | Qty: 18 | Fill #1

## 2020-02-22 ENCOUNTER — Ambulatory Visit (INDEPENDENT_AMBULATORY_CARE_PROVIDER_SITE_OTHER): Payer: 59 | Admitting: Gastroenterology

## 2020-02-22 ENCOUNTER — Encounter: Payer: Self-pay | Admitting: Gastroenterology

## 2020-02-22 ENCOUNTER — Other Ambulatory Visit: Payer: Self-pay | Admitting: Gastroenterology

## 2020-02-22 VITALS — BP 100/60 | HR 71 | Ht 64.0 in | Wt 183.0 lb

## 2020-02-22 DIAGNOSIS — R110 Nausea: Secondary | ICD-10-CM

## 2020-02-22 DIAGNOSIS — R1084 Generalized abdominal pain: Secondary | ICD-10-CM

## 2020-02-22 MED ORDER — PLENVU 140 G PO SOLR: 140.0000 g | ORAL | 0 refills | Status: DC

## 2020-02-22 MED FILL — PLENVU 140 GM SOLR: 140 | 1 days supply | Qty: 3 | Fill #0

## 2020-02-22 NOTE — Patient Instructions (Signed)
If you are age 61 or older, your body mass index should be between 23-30. Your Body mass index is 31.41 kg/m. If this is out of the aforementioned range listed, please consider follow up with your Primary Care Provider.  If you are age 53 or younger, your body mass index should be between 19-25. Your Body mass index is 31.41 kg/m. If this is out of the aformentioned range listed, please consider follow up with your Primary Care Provider.   You have been scheduled for an endoscopy and colonoscopy. Please follow the written instructions given to you at your visit today. Please pick up your prep supplies at the pharmacy within the next 1-3 days. If you use inhalers (even only as needed), please bring them with you on the day of your procedure.  It was a pleasure to see you today!  Dr. Loletha Carrow

## 2020-02-22 NOTE — Progress Notes (Signed)
[facility] Consult Note:  History: Kelli Bell 02/22/2020  Referring provider: Self-referred Primary care provider: Dr. Cathlean Cower  Reason for consult/chief complaint: Abdominal Pain (Pt reports intermittent mid abdominal pain; reports frequent loose stools; denies blood or mucus in her stool)   Subjective  HPI:  This is a very pleasant 60 year old woman self-referred for abdominal pain. Kelli Bell underwent gastric bypass in 2015 and had the typical change in bowel habits afterwards of 2-4 BMs per day that are typically formed.  Sometimes the stool may be loose with certain dietary triggers.  She is concerned because for least a year she has had recurrent episodes of generalized abdominal pain, though often more focused in the upper abdomen.  It is colicky in nature, might occur more often after eating but not necessarily related to bowel movements.  She has been on a PPI many years and denies dysphagia.  While she has been bothered by orthopedic problems of back pain, shoulder injury and pseudogout, she has not been taking aspirin or NSAIDs, preferring to use Tylenol for pain.  Her weight is up about 40 pounds in the last year, which she attributes to being less active from her orthopedic pain. The pain is what concerns her the most, she feels the bowel habits have been about usual for her and she denies rectal bleeding. Her father and her daughter reportedly have IBS as well.  Normal screening colonoscopy Olevia Perches) August 2012 ROS:  Review of Systems  Constitutional: Negative for appetite change and unexpected weight change.  HENT: Negative for mouth sores and voice change.   Eyes: Negative for pain and redness.  Respiratory: Negative for cough and shortness of breath.   Cardiovascular: Negative for chest pain and palpitations.  Genitourinary: Negative for dysuria and hematuria.  Musculoskeletal: Positive for arthralgias and back pain. Negative for myalgias.   Skin: Negative for pallor and rash.  Neurological: Negative for weakness and headaches.  Hematological: Negative for adenopathy.     Past Medical History: Past Medical History:  Diagnosis Date  . ANXIETY 03/30/2007  . BACK PAIN 02/20/2009  . Essential hypertension, benign 05/31/2012  . Family history of anesthesia complication    "mother has hard time waking up"  . GERD 01/13/2007  . H/O hiatal hernia   . HYPERLIPIDEMIA 02/22/2008  . HYPOTHYROIDISM 03/30/2007  . MENOPAUSAL DISORDER 02/23/2008  . Nocturia   . OBSTRUCTIVE SLEEP APNEA 06/29/2007   has not used c pap x 8 months due to wt loss  . Osteoarthritis   . SINUSITIS- ACUTE-NOS 03/30/2007  . THORACIC/LUMBOSACRAL NEURITIS/RADICULITIS UNSPEC 02/23/2008  . Wheezing 01/08/2009     Past Surgical History: Past Surgical History:  Procedure Laterality Date  . ABDOMINAL HYSTERECTOMY  2004   with BSO  . BREATH TEK H PYLORI N/A 05/05/2013   Procedure: BREATH TEK H PYLORI;  Surgeon: Shann Medal, MD;  Location: [facility];  Service: General;  Laterality: N/A;  . CHOLECYSTECTOMY  1980  . GASTRIC ROUX-EN-Y N/A 09/12/2013   Procedure: LAPAROSCOPIC ROUX-EN-Y GASTRIC BYPASS WITH UPPER ENDOSCOPY WITH ENTEROLYSIS OF ADHESIONS;  Surgeon: Shann Medal, MD;  Location: [facility];  Service: General;  Laterality: N/A;  . jaw bone surgury     60 yo  . SHOULDER ARTHROSCOPY WITH ROTATOR CUFF REPAIR AND SUBACROMIAL DECOMPRESSION Right 01/02/2020   Procedure: SHOULDER ARTHROSCOPY WITH ROTATOR CUFF REPAIR , SUBACROMIAL DECOMPRESSION,DISTAL CLAVICLE EXICISION;  Surgeon: Tania Ade, MD;  Location: [facility];  Service: Orthopedics;  Laterality: Right;  . TONSILLECTOMY  Family History: Family History  Problem Relation Age of Onset  . Stroke Mother   . Hypertension Mother   . Diabetes Mother   . Cancer Mother        basel cell cancer  . Thyroid disease Mother   . Arthritis Other   . Colon polyps Father 46       "CANCEROUS  POLYPS REMOVED THIS YEAR"  . Asthma Brother   . Thyroid disease Maternal Grandmother   . Lymphoma Maternal Grandmother   . Osteoporosis Other   . Breast cancer Maternal Aunt        mat great aunt  . Diabetes Paternal Grandmother   . Heart disease Paternal Grandmother   . Colon cancer Neg Hx     Social History: Social History   Socioeconomic History  . Marital status: Widowed    Spouse name: Not on file  . Number of children: Not on file  . Years of education: Not on file  . Highest education level: Not on file  Occupational History  . Not on file  Tobacco Use  . Smoking status: Former Smoker    Quit date: 07/06/1997    Years since quitting: 22.6  . Smokeless tobacco: Never Used  Vaping Use  . Vaping Use: Never used  Substance and Sexual Activity  . Alcohol use: Yes    Comment: [place]  . Drug use: No  . Sexual activity: Not Currently    Birth control/protection: Surgical    Comment: intercourse age 51, less than 5 sexual partners,des neg  Other Topics Concern  . Not on file  Social History Narrative  . Not on file   Social Determinants of Health   Financial Resource Strain: Not on file  Food Insecurity: Not on file  Transportation Needs: Not on file  Physical Activity: Not on file  Stress: Not on file  Social Connections: Not on file    Allergies: Allergies  Allergen Reactions  . Codeine Nausea Only    Outpatient Meds: Current Outpatient Medications  Medication Sig Dispense Refill  . allopurinol (ZYLOPRIM) 100 MG tablet TAKE 2 TABLETS BY MOUTH DAILY 180 tablet 0  . ALPRAZolam (XANAX) 0.25 MG tablet TAKE 1 TABLET BY MOUTH TWICE DAILY AS NEEDED FOR ANXIETY 60 tablet 0  . CALCIUM PO Take 5 mLs by mouth daily.    . carBAMazepine (TEGRETOL PO) Take 100 mg by mouth 2 (two) times daily.    . clobetasol (TEMOVATE) 0.05 % external solution     . colchicine 0.6 MG tablet colchicine 0.6 mg tablet    . Cyanocobalamin (B-12 PO) Take 5 mLs by  mouth as needed (low b12).    . cyclobenzaprine (FLEXERIL) 10 MG tablet cyclobenzaprine 10 mg tablet    . DULoxetine (CYMBALTA) 60 MG capsule Take 60 mg by mouth daily.    Marland Kitchen gabapentin (NEURONTIN) 100 MG capsule TAKE 2 CAPSULES BY MOUTH AT BEDTIME 180 capsule 0  . levothyroxine (SYNTHROID) 88 MCG tablet Take 1 tablet (88 mcg total) by mouth every morning. 90 tablet 3  . Multiple Vitamins-Minerals (MULTIVITAMIN ADULT PO) Take 1 tablet by mouth daily.    Marland Kitchen nystatin-triamcinolone (MYCOLOG II) cream Apply 1 application topically as needed. 30 g 1  . omeprazole (PRILOSEC) 20 MG capsule TAKE 2 CAPSULES BY MOUTH DAILY. 180 capsule 1  . ondansetron (ZOFRAN) 4 MG tablet Take 1 tablet (4 mg total) by mouth daily as needed for nausea or vomiting. 30 tablet 1  . PEG-KCl-NaCl-NaSulf-Na Asc-C (PLENVU)  140 g SOLR Take 140 g by mouth as directed. Manufacturer's coupon Universal coupon code:BIN: P2366821; GROUP: [id number]; PCN: [id number]; ID: [id number]; PAY NO MORE $50 1 each 0  . tiZANidine (ZANAFLEX) 4 MG tablet Take 1 tablet (4 mg total) by mouth every 8 (eight) hours as needed for muscle spasms. 30 tablet 1  . topiramate (TOPAMAX) 50 MG tablet Take 1 tablet by mouth daily.    . traMADol (ULTRAM) 50 MG tablet Take 1 tablet (50 mg total) by mouth every 6 (six) hours as needed. 60 tablet 3   No current facility-administered medications for this visit.      ___________________________________________________________________ Objective   Exam:  BP 100/60   Pulse 71   Ht 5\' 4"  (1.626 m)   Wt 183 lb (83 kg)   BMI 31.41 kg/m  Wt Readings from Last 3 Encounters:  02/22/20 183 lb (83 kg)  01/02/20 186 lb 8.2 oz (84.6 kg)  12/20/19 176 lb (79.8 kg)     General: Well-appearing, antalgic gait.  Gets on exam table without assistance, but visibly uncomfortable  Eyes: sclera anicteric, no redness  ENT: oral mucosa moist without lesions, no cervical or supraclavicular lymphadenopathy  CV: RRR without murmur,  S1/S2, no JVD, no peripheral edema  Resp: clear to auscultation bilaterally, normal RR and effort noted  GI: soft, epigastric tenderness, with active bowel sounds. No guarding or palpable organomegaly noted.  Open cholecystectomy scar  Skin; warm and dry, no rash or jaundice noted  Neuro: awake, alert and oriented x 3. Normal gross motor function and fluent speech  Labs:  CBC Latest Ref Rng & Units 12/18/2019 11/28/2019 04/13/2019  WBC 4.0 - 10.5 K/uL 5.1 4.5 5.4  Hemoglobin 12.0 - 15.0 g/dL 12.5 12.3 13.2  Hematocrit 36.0 - 46.0 % 37.7 37.0 39.8  Platelets 150.0 - 400.0 K/uL 358.0 380.0 299   CMP Latest Ref Rng & Units 12/18/2019 11/28/2019 04/13/2019  Glucose 70 - 99 mg/dL 82 - 90  BUN 6 - 23 mg/dL 11 - 11  Creatinine 0.40 - 1.20 mg/dL 0.63 - 0.65  Sodium 135 - 145 mEq/L 140 - 139  Potassium 3.5 - 5.1 mEq/L 3.9 - 3.9  Chloride 96 - 112 mEq/L 103 - 103  CO2 19 - 32 mEq/L 28 - 26  Calcium 8.4 - 10.5 mg/dL 8.8 - 8.8(L)  Total Protein 6.0 - 8.3 g/dL 6.4 6.1 6.5  Total Bilirubin 0.2 - 1.2 mg/dL 0.3 0.3 0.3  Alkaline Phos 39 - 117 U/L 89 80 86  AST 0 - 37 U/L 15 16 27   ALT 0 - 35 U/L 10 10 25    Neg UBT 2015 (before GBP) and neg IgG 11/2019  Radiologic Studies:  No recent abdominal imaging  Assessment: Encounter Diagnoses  Name Primary?  . Generalized abdominal pain Yes  . Nausea without vomiting    Episodic generalized abdominal pain, though primarily epigastric for at least a year.  She had not previously experienced this after gastric bypass.  Bowel habits have remained usual for her.  She is also having nausea without vomiting. No NSAID use, but still must consider GBP anastomotic ulcer, jejunojejunal anastomotic stricture, intra-abdominal adhesions.  Seems less likely to be a lower digestive source of symptoms without change in bowel habits.  Postsurgical anatomy is a risk factor for SIBO, that the symptoms are not the typical bloating and diarrhea seen with  that.  Plan: Upper endoscopy and colonoscopy.  She is agreeable after discussion of procedure and risks.  The  benefits and risks of the planned procedure were described in detail with the patient or (when appropriate) their health care proxy.  Risks were outlined as including, but not limited to, bleeding, infection, perforation, adverse medication reaction leading to cardiac or pulmonary decompensation, pancreatitis (if ERCP).  The limitation of incomplete mucosal visualization was also discussed.  No guarantees or warranties were given.  She was instructed to take her bowel prep slowly, and to take a dose of her ondansetron before starting it.  If endoscopic work-up unrevealing, most likely CT abdomen and pelvis next and/or small bowel x-ray series if concerns of problems at the jejunojejunal anastomosis.   Thank you for the courtesy of this consult.  Please call me with any questions or concerns.  Nelida Meuse III  CC: Referring provider noted above

## 2020-03-04 ENCOUNTER — Other Ambulatory Visit: Payer: Self-pay | Admitting: Internal Medicine

## 2020-03-04 MED FILL — ALLOPURINOL 100 MG TABS: 100 | 30 days supply | Qty: 60 | Fill #1

## 2020-03-05 MED FILL — DULOXETINE HCL 60 MG CPEP: 60 | 90 days supply | Qty: 90 | Fill #0

## 2020-03-05 MED FILL — ALPRAZolam 0.25 MG TABS: 0.25 | 30 days supply | Qty: 60 | Fill #0

## 2020-03-07 ENCOUNTER — Other Ambulatory Visit: Payer: Self-pay | Admitting: Internal Medicine

## 2020-03-08 ENCOUNTER — Other Ambulatory Visit: Payer: Self-pay | Admitting: Internal Medicine

## 2020-03-08 MED FILL — carBAMazepine ER 100 MG TB1: 100 | 30 days supply | Qty: 120 | Fill #0

## 2020-03-08 MED FILL — TOPIRAMATE 50 MG TABLET: 50 | 90 days supply | Qty: 90 | Fill #0

## 2020-03-08 NOTE — Telephone Encounter (Signed)
Done erx 

## 2020-03-08 NOTE — Telephone Encounter (Signed)
Patient calling, states she is completely out and needs them before the end of the day

## 2020-03-08 NOTE — Telephone Encounter (Signed)
I am happy to send in as it appears Dr. Jenny Reichmann said he would take over these when needed. Can you call and just verify dosing with her and then let me know and I will send in since this is the first rx from our office. Thanks

## 2020-03-11 ENCOUNTER — Other Ambulatory Visit: Payer: Self-pay | Admitting: Internal Medicine

## 2020-03-11 MED ORDER — TOPIRAMATE 50 MG PO TABS
50.0000 mg | ORAL_TABLET | Freq: Every day | ORAL | 3 refills | Status: DC
Start: 2020-03-11 — End: 2020-03-11

## 2020-03-11 MED ORDER — CARBAMAZEPINE ER 100 MG PO TB12
200.0000 mg | ORAL_TABLET | Freq: Two times a day (BID) | ORAL | 3 refills | Status: DC
Start: 2020-03-11 — End: 2020-03-11

## 2020-03-22 ENCOUNTER — Telehealth: Payer: Self-pay | Admitting: Hematology and Oncology

## 2020-03-22 NOTE — Telephone Encounter (Signed)
Patient called in and rescheduled appointments from 3/30. Patient aware of updated appointments date and times.

## 2020-03-29 ENCOUNTER — Encounter: Payer: Self-pay | Admitting: Gastroenterology

## 2020-04-01 ENCOUNTER — Other Ambulatory Visit: Payer: Self-pay | Admitting: Family Medicine

## 2020-04-01 MED FILL — GABAPENTIN 100 MG CAPSULE: 100 | 90 days supply | Qty: 180 | Fill #0

## 2020-04-04 ENCOUNTER — Other Ambulatory Visit: Payer: Self-pay | Admitting: Internal Medicine

## 2020-04-05 MED FILL — ALPRAZolam 0.25 MG TABS: 0.25 | 30 days supply | Qty: 60 | Fill #0

## 2020-04-11 ENCOUNTER — Ambulatory Visit (AMBULATORY_SURGERY_CENTER): Payer: 59 | Admitting: Gastroenterology

## 2020-04-11 ENCOUNTER — Other Ambulatory Visit: Payer: Self-pay

## 2020-04-11 ENCOUNTER — Encounter: Payer: Self-pay | Admitting: Gastroenterology

## 2020-04-11 VITALS — BP 111/52 | HR 55 | Temp 98.0°F | Resp 15 | Ht 64.0 in | Wt 183.0 lb

## 2020-04-11 DIAGNOSIS — R197 Diarrhea, unspecified: Secondary | ICD-10-CM | POA: Diagnosis not present

## 2020-04-11 DIAGNOSIS — K635 Polyp of colon: Secondary | ICD-10-CM | POA: Diagnosis not present

## 2020-04-11 DIAGNOSIS — R1084 Generalized abdominal pain: Secondary | ICD-10-CM

## 2020-04-11 DIAGNOSIS — R110 Nausea: Secondary | ICD-10-CM | POA: Diagnosis not present

## 2020-04-11 DIAGNOSIS — D122 Benign neoplasm of ascending colon: Secondary | ICD-10-CM

## 2020-04-11 DIAGNOSIS — K529 Noninfective gastroenteritis and colitis, unspecified: Secondary | ICD-10-CM

## 2020-04-11 DIAGNOSIS — K5730 Diverticulosis of large intestine without perforation or abscess without bleeding: Secondary | ICD-10-CM

## 2020-04-11 MED ORDER — SODIUM CHLORIDE 0.9 % IV SOLN: 500.0000 mL | Freq: Once | INTRAVENOUS | Status: DC

## 2020-04-11 NOTE — Progress Notes (Signed)
Ct scan w/ contrast will be set up by Dr Loletha Carrow' office therefore contrast solution given to pt to take home to save her from having to come back to pick it up. Informed pt that office will call her w/ details of CT . No labs have been ordered today pt is not 60 yrs old or greater.

## 2020-04-11 NOTE — Progress Notes (Signed)
Called to room to assist during endoscopic procedure.  Patient ID and intended procedure confirmed with present staff. Received instructions for my participation in the procedure from the performing physician.  

## 2020-04-11 NOTE — Patient Instructions (Addendum)
Handouts on polyps & diverticulosis given to you today  Await pathology results on polyp removed & of biopsies done   Ct scan will be set up by Dr Loletha Carrow' office    YOU HAD AN ENDOSCOPIC PROCEDURE TODAY AT THE [facility]:   Refer to the procedure report that was given to you for any specific questions about what was found during the examination.  If the procedure report does not answer your questions, please call your gastroenterologist to clarify.  If you requested that your care partner not be given the details of your procedure findings, then the procedure report has been included in a sealed envelope for you to review at your convenience later.  YOU SHOULD EXPECT: Some feelings of bloating in the abdomen. Passage of more gas than usual.  Walking can help get rid of the air that was put into your GI tract during the procedure and reduce the bloating. If you had a lower endoscopy (such as a colonoscopy or flexible sigmoidoscopy) you may notice spotting of blood in your stool or on the toilet paper. If you underwent a bowel prep for your procedure, you may not have a normal bowel movement for a few days.  Please Note:  You might notice some irritation and congestion in your nose or some drainage.  This is from the oxygen used during your procedure.  There is no need for concern and it should clear up in a day or so.  SYMPTOMS TO REPORT IMMEDIATELY:   Following lower endoscopy (colonoscopy or flexible sigmoidoscopy):  Excessive amounts of blood in the stool  Significant tenderness or worsening of abdominal pains  Swelling of the abdomen that is new, acute  Fever of 100F or higher   Following upper endoscopy (EGD)  Vomiting of blood or coffee ground material  New chest pain or pain under the shoulder blades  Painful or persistently difficult swallowing  New shortness of breath  Fever of 100F or higher  Black, tarry-looking stools  For urgent or emergent issues, a  gastroenterologist can be reached at any hour by calling [phone]. Do not use MyChart messaging for urgent concerns.    DIET:  We do recommend a small meal at first, but then you may proceed to your regular diet.  Drink plenty of fluids but you should avoid alcoholic beverages for 24 hours.  ACTIVITY:  You should plan to take it easy for the rest of today and you should NOT DRIVE or use heavy machinery until tomorrow (because of the sedation medicines used during the test).    FOLLOW UP: Our staff will call the number listed on your records 48-72 hours following your procedure to check on you and address any questions or concerns that you may have regarding the information given to you following your procedure. If we do not reach you, we will leave a message.  We will attempt to reach you two times.  During this call, we will ask if you have developed any symptoms of COVID 19. If you develop any symptoms (ie: fever, flu-like symptoms, shortness of breath, cough etc.) before then, please call [phone].  If you test positive for Covid 19 in the 2 weeks post procedure, please call and report this information to [place].    If any biopsies were taken you will be contacted by phone or by letter within the next 1-3 weeks.  Please call us at [phone] if you have not heard about the biopsies in  3 weeks.    SIGNATURES/CONFIDENTIALITY: You and/or your care partner have signed paperwork which will be entered into your electronic medical record.  These signatures attest to the fact that that the information above on your After Visit Summary has been reviewed and is understood.  Full responsibility of the confidentiality of this discharge information lies with you and/or your care-partner.

## 2020-04-11 NOTE — Op Note (Signed)
[place] Patient Name: Kelli Bell Procedure Date: 04/11/2020 2:19 PM MRN: [id number] Endoscopist: Mallie Mussel L. Loletha Carrow , MD Age: 60 Referring MD:  Date of Birth: 02/03/1960 Gender: Female Account #: [id number] Procedure:                Upper GI endoscopy Indications:              Generalized abdominal pain (primarily upper,                            colicky), Nausea with vomiting (with pain                            episodes); Hx GBP surgery Medicines:                Monitored Anesthesia Care Procedure:                Pre-Anesthesia Assessment:                           - Prior to the procedure, a History and Physical                            was performed, and patient medications and                            allergies were reviewed. The patient's tolerance of                            previous anesthesia was also reviewed. The risks                            and benefits of the procedure and the sedation                            options and risks were discussed with the patient.                            All questions were answered, and informed consent                            was obtained. Prior Anticoagulants: The patient has                            taken no previous anticoagulant or antiplatelet                            agents. ASA Grade Assessment: II - A patient with                            mild systemic disease. After reviewing the risks                            and benefits, the patient was deemed in  satisfactory condition to undergo the procedure.                           After obtaining informed consent, the endoscope was                            passed under direct vision. Throughout the                            procedure, the patient's blood pressure, pulse, and                            oxygen saturations were monitored continuously. The                            Endoscope was introduced through the mouth,  and                            advanced to the mid-jejunum. The upper GI endoscopy                            was accomplished without difficulty. The patient                            tolerated the procedure well. Scope In: Scope Out: Findings:                 The esophagus was normal.                           Evidence of a gastric bypass was found. A gastric                            pouch with a large size was found. The                            gastrojejunal anastomosis was characterized by                            visible sutures. No ulceration or stricture seen.                            The jejunum was deeply intubated (80cm from                            incisors), but the jejuno-jejunal anastomosis was                            not encountered. That limb appeared to have a                            normal caliber and the mucosa was normal.                           The examined jejunum was  normal. Complications:            No immediate complications. Estimated Blood Loss:     Estimated blood loss: none. Impression:               - Normal esophagus.                           - Gastric bypass with a large-sized pouch.                            Gastrojejunal anastomosis characterized by visible                            sutures.                           - Normal examined jejunum.                           - No specimens collected. Recommendation:           - Patient has a contact number available for                            emergencies. The signs and symptoms of potential                            delayed complications were discussed with the                            patient. Return to normal activities tomorrow.                            Written discharge instructions were provided to the                            patient.                           - Resume previous diet.                           - Continue present medications.                           -  See the other procedure note for documentation of                            additional recommendations.                           - Perform a CT scan (computed tomography) of                            abdomen with contrast and pelvis with contrast at  appointment to be scheduled. (? obstructive cause) [name] L. Loletha Carrow, MD 04/11/2020 3:14:32 PM This report has been signed electronically.

## 2020-04-11 NOTE — Op Note (Signed)
[facility] Patient Name: Kelli Bell Procedure Date: 04/11/2020 2:19 PM MRN: [id number] Endoscopist: Mallie Mussel L. Loletha Carrow , MD Age: 60 Referring MD:  Date of Birth: Jun 10, 1960 Gender: Female Account #: [id number] Procedure:                Colonoscopy Indications:              Generalized abdominal pain (primarily upper and                            colicky), Diarrhea (intermittent, since GBP) Medicines:                Monitored Anesthesia Care Procedure:                Pre-Anesthesia Assessment:                           - Prior to the procedure, a History and Physical                            was performed, and patient medications and                            allergies were reviewed. The patient's tolerance of                            previous anesthesia was also reviewed. The risks                            and benefits of the procedure and the sedation                            options and risks were discussed with the patient.                            All questions were answered, and informed consent                            was obtained. Prior Anticoagulants: The patient has                            taken no previous anticoagulant or antiplatelet                            agents. ASA Grade Assessment: II - A patient with                            mild systemic disease. After reviewing the risks                            and benefits, the patient was deemed in                            satisfactory condition to undergo the procedure.  After obtaining informed consent, the colonoscope                            was passed under direct vision. Throughout the                            procedure, the patient's blood pressure, pulse, and                            oxygen saturations were monitored continuously. The                            Olympus CF-HQ190 [id number]) Colonoscope was                            introduced through the  anus and advanced to the the                            terminal ileum, with identification of the                            appendiceal orifice and IC valve. The colonoscopy                            was somewhat difficult due to a redundant colon.                            The patient tolerated the procedure well. The                            quality of the bowel preparation was good (except                            for residual pills and debris in the cecum that                            could not be completely cleared with lavage). The                            terminal ileum, ileocecal valve, appendiceal                            orifice, and rectum were photographed. Scope In: 2:33:58 PM Scope Out: 2:53:14 PM Scope Withdrawal Time: 0 hours 13 minutes 45 seconds  Total Procedure Duration: 0 hours 19 minutes 16 seconds  Findings:                 The perianal and digital rectal examinations were                            normal.                           The terminal ileum appeared normal.  Normal mucosa was found in the entire colon.                            Biopsies for histology were taken with a cold                            forceps from the left colon for evaluation of                            microscopic colitis.                           A 4 mm polyp was found in the ascending colon. The                            polyp was sessile. The polyp was removed with a                            cold snare. Resection and retrieval were complete.                           A few diverticula were found in the left colon.                           The exam was otherwise without abnormality on                            direct and retroflexion views. Complications:            No immediate complications. Estimated Blood Loss:     Estimated blood loss was minimal. Impression:               - The examined portion of the ileum was normal.                            - Normal mucosa in the entire examined colon.                            Biopsied.                           - One 4 mm polyp in the ascending colon, removed                            with a cold snare. Resected and retrieved.                           - Diverticulosis in the left colon.                           - The examination was otherwise normal on direct                            and retroflexion views. Recommendation:           -  Patient has a contact number available for                            emergencies. The signs and symptoms of potential                            delayed complications were discussed with the                            patient. Return to normal activities tomorrow.                            Written discharge instructions were provided to the                            patient.                           - Resume previous diet.                           - Continue present medications.                           - Await pathology results.                           - Repeat colonoscopy is recommended for                            surveillance. The colonoscopy date will be                            determined after pathology results from today's                            exam become available for review.                           - See the other procedure note for documentation of                            additional recommendations. [name] L. Loletha Carrow, MD 04/11/2020 3:08:16 PM This report has been signed electronically.

## 2020-04-11 NOTE — Progress Notes (Signed)
To PACU VSS. Report to [email or web address] 

## 2020-04-12 ENCOUNTER — Telehealth: Payer: Self-pay

## 2020-04-12 DIAGNOSIS — R1013 Epigastric pain: Secondary | ICD-10-CM

## 2020-04-12 NOTE — Telephone Encounter (Signed)
-----   Message from Nelida Meuse III, MD sent at 04/11/2020  5:12 PM EDT ----- Please arrange a CT scan abdomen and pelvis with oral and IV contrast  Indication:  Upper abdominal pain, history of gastric bypass, rule out obstruction

## 2020-04-12 NOTE — Telephone Encounter (Signed)
Patient is scheduled for a CT scan at [facility] on Wednesday, 04/17/20 at 4 PM, arrive at 3:45 PM. NPO 4 hours prior. Drink 1st bottle of contrast at 2 PM, 2nd bottle of contrast at 3 PM. Patient will need labs due to age. Pick up contrast from 3rd floor.   Lm on vm for patient to return call.   Lab orders in epic.

## 2020-04-15 ENCOUNTER — Telehealth: Payer: Self-pay

## 2020-04-15 NOTE — Telephone Encounter (Signed)
LVM

## 2020-04-15 NOTE — Telephone Encounter (Signed)
Spoke with patient, she is aware of the needed lab work and has already been given contrast. Reviewed instructions with patient again, she will come in for labs a couple of days prior to her CT scan appt. Patient verbalized understanding of all information and had no concerns at the end of the call.

## 2020-04-17 ENCOUNTER — Inpatient Hospital Stay: Admission: RE | Admit: 2020-04-17 | Payer: 59 | Source: Ambulatory Visit

## 2020-04-18 ENCOUNTER — Ambulatory Visit: Payer: BC Managed Care – PPO | Admitting: Internal Medicine

## 2020-04-21 ENCOUNTER — Encounter: Payer: Self-pay | Admitting: Gastroenterology

## 2020-04-22 ENCOUNTER — Other Ambulatory Visit (INDEPENDENT_AMBULATORY_CARE_PROVIDER_SITE_OTHER): Payer: 59

## 2020-04-22 DIAGNOSIS — R1013 Epigastric pain: Secondary | ICD-10-CM

## 2020-04-22 LAB — CREATININE, SERUM: Creatinine, Ser: 0.53 mg/dL (ref 0.40–1.20)

## 2020-04-22 LAB — BUN: BUN: 7 mg/dL (ref 6–23)

## 2020-04-23 ENCOUNTER — Other Ambulatory Visit: Payer: Self-pay

## 2020-04-23 ENCOUNTER — Ambulatory Visit (INDEPENDENT_AMBULATORY_CARE_PROVIDER_SITE_OTHER): Payer: 59 | Admitting: Internal Medicine

## 2020-04-23 ENCOUNTER — Encounter: Payer: Self-pay | Admitting: Internal Medicine

## 2020-04-23 ENCOUNTER — Other Ambulatory Visit: Payer: Self-pay | Admitting: Internal Medicine

## 2020-04-23 ENCOUNTER — Ambulatory Visit (INDEPENDENT_AMBULATORY_CARE_PROVIDER_SITE_OTHER): Payer: 59

## 2020-04-23 VITALS — BP 112/70 | HR 66 | Temp 98.0°F | Ht 64.0 in | Wt 188.0 lb

## 2020-04-23 DIAGNOSIS — R233 Spontaneous ecchymoses: Secondary | ICD-10-CM

## 2020-04-23 DIAGNOSIS — R600 Localized edema: Secondary | ICD-10-CM

## 2020-04-23 DIAGNOSIS — E7849 Other hyperlipidemia: Secondary | ICD-10-CM | POA: Diagnosis not present

## 2020-04-23 DIAGNOSIS — Z0001 Encounter for general adult medical examination with abnormal findings: Secondary | ICD-10-CM

## 2020-04-23 DIAGNOSIS — R030 Elevated blood-pressure reading, without diagnosis of hypertension: Secondary | ICD-10-CM

## 2020-04-23 DIAGNOSIS — E039 Hypothyroidism, unspecified: Secondary | ICD-10-CM

## 2020-04-23 DIAGNOSIS — R238 Other skin changes: Secondary | ICD-10-CM | POA: Diagnosis not present

## 2020-04-23 DIAGNOSIS — F32A Depression, unspecified: Secondary | ICD-10-CM

## 2020-04-23 DIAGNOSIS — R739 Hyperglycemia, unspecified: Secondary | ICD-10-CM | POA: Diagnosis not present

## 2020-04-23 DIAGNOSIS — R609 Edema, unspecified: Secondary | ICD-10-CM

## 2020-04-23 LAB — CBC WITH DIFFERENTIAL/PLATELET
Basophils Absolute: 0.2 10*3/uL — ABNORMAL HIGH (ref 0.0–0.1)
Basophils Relative: 3.8 % — ABNORMAL HIGH (ref 0.0–3.0)
Eosinophils Absolute: 0.1 10*3/uL (ref 0.0–0.7)
Eosinophils Relative: 1.5 % (ref 0.0–5.0)
HCT: 34.8 % — ABNORMAL LOW (ref 36.0–46.0)
Hemoglobin: 11.5 g/dL — ABNORMAL LOW (ref 12.0–15.0)
Lymphocytes Relative: 46 % (ref 12.0–46.0)
Lymphs Abs: 1.9 10*3/uL (ref 0.7–4.0)
MCHC: 33.1 g/dL (ref 30.0–36.0)
MCV: 90.7 fl (ref 78.0–100.0)
Monocytes Absolute: 0.5 10*3/uL (ref 0.1–1.0)
Monocytes Relative: 11.8 % (ref 3.0–12.0)
Neutro Abs: 1.5 10*3/uL (ref 1.4–7.7)
Neutrophils Relative %: 36.9 % — ABNORMAL LOW (ref 43.0–77.0)
Platelets: 388 10*3/uL (ref 150.0–400.0)
RBC: 3.84 Mil/uL — ABNORMAL LOW (ref 3.87–5.11)
RDW: 13.9 % (ref 11.5–15.5)
WBC: 4.2 10*3/uL (ref 4.0–10.5)

## 2020-04-23 LAB — PROTIME-INR
INR: 0.9 ratio (ref 0.8–1.0)
Prothrombin Time: 10.4 s (ref 9.6–13.1)

## 2020-04-23 LAB — BASIC METABOLIC PANEL
BUN: 8 mg/dL (ref 6–23)
CO2: 28 mEq/L (ref 19–32)
Calcium: 8.6 mg/dL (ref 8.4–10.5)
Chloride: 106 mEq/L (ref 96–112)
Creatinine, Ser: 0.61 mg/dL (ref 0.40–1.20)
GFR: 97.2 mL/min (ref >60.00)
Glucose, Bld: 92 mg/dL (ref 70–99)
Potassium: 4.6 mEq/L (ref 3.5–5.1)
Sodium: 140 mEq/L (ref 135–145)

## 2020-04-23 LAB — LIPID PANEL
Cholesterol: 243 mg/dL — ABNORMAL HIGH (ref 0–200)
HDL: 101.6 mg/dL (ref >39.00)
LDL Cholesterol: 119 mg/dL — ABNORMAL HIGH (ref 0–99)
NonHDL: 141.1
Total CHOL/HDL Ratio: 2
Triglycerides: 112 mg/dL (ref 0.0–149.0)
VLDL: 22.4 mg/dL (ref 0.0–40.0)

## 2020-04-23 LAB — HEPATIC FUNCTION PANEL
ALT: 11 U/L (ref 0–35)
AST: 17 U/L (ref 0–37)
Albumin: 3.9 g/dL (ref 3.5–5.2)
Alkaline Phosphatase: 101 U/L (ref 39–117)
Bilirubin, Direct: 0 mg/dL (ref 0.0–0.3)
Total Bilirubin: 0.2 mg/dL (ref 0.2–1.2)
Total Protein: 6.4 g/dL (ref 6.0–8.3)

## 2020-04-23 LAB — TSH: TSH: 0.69 u[IU]/mL (ref 0.35–4.50)

## 2020-04-23 LAB — BRAIN NATRIURETIC PEPTIDE: Pro B Natriuretic peptide (BNP): 30 pg/mL (ref 0.0–100.0)

## 2020-04-23 LAB — HEMOGLOBIN A1C: Hgb A1c MFr Bld: 5.9 % (ref 4.6–6.5)

## 2020-04-23 MED ORDER — DULOXETINE HCL 60 MG PO CPEP: 120.0000 mg | ORAL_CAPSULE | Freq: Every day | ORAL | 3 refills | Status: DC

## 2020-04-23 MED FILL — DULOXETINE HCL 60 MG CPEP: 60 | 90 days supply | Qty: 180 | Fill #0

## 2020-04-23 NOTE — Patient Instructions (Signed)
Ok to increase the cymbalta to 2 of the 60 mg per day  Please continue all other medications as before, and refills have been done if requested.  Please have the pharmacy call with any other refills you may need.  Please continue your efforts at being more active, low cholesterol diet, and weight control.  You are otherwise up to date with prevention measures today.  Please keep your appointments with your specialists as you may have planned  Please go to the XRAY Department in the first floor for the x-ray testing  Please go to the LAB at the blood drawing area for the tests to be done  You will be contacted by phone if any changes need to be made immediately.  Otherwise, you will receive a letter about your results with an explanation, but please check with MyChart first.  Please remember to sign up for MyChart if you have not done so, as this will be important to you in the future with finding out test results, communicating by private email, and scheduling acute appointments online when needed.  Please make an Appointment to return in 6 months, or sooner if needed

## 2020-04-23 NOTE — Progress Notes (Signed)
Patient ID: Kelli Bell, female   DOB: 1960-10-21, 60 y.o.   MRN: [id number]         Chief Complaint:: wellness exam and Follow-up (4 month f/u)  chronic lbp, anxiety/depression, leg swelling, and easy bruising       HPI:  Kelli Bell is a 60 y.o. female here for wellness exam; will consider covid booster and flu shot later this yr, plans to f/u on mammogram ordered recently, o/w up to date with preventive referrals and immunizations                        Also with marked bialteral leg and ankle swelling, worse in the 2 wk after trip to [facility] [place] with 12 hrs in a car, better now with elevation, but edema overall still worse than usual . Pt denies chest pain, increased sob or doe, wheezing, orthopnea, PND, palpitations, dizziness or syncope. Denies new neuro focal s/s.   Pt denies polydipsia, polyuria, Denies worsening reflux, abd pain, dysphagia, n/v, bowel change or blood. Now s/p egd and colnoscopy per GI Dr Loletha Carrow, also for Ct tomorrow. Does also mention several months of easy bruising mostly to arms but also torso and legs.  Also Pt continues to have recurring LBP without change in severity, bowel or bladder change, fever, wt loss,  worsening LE pain/numbness/weakness, gait change or falls, Has had mild worsening depressive symptoms, but no suicidal ideation, or panic; has ongoing anxiety.    Wt Readings from Last 3 Encounters:  04/29/20 190 lb (86.2 kg)  04/23/20 188 lb (85.3 kg)  04/11/20 183 lb (83 kg)   BP Readings from Last 3 Encounters:  04/29/20 125/75  04/23/20 112/70  04/11/20 (!) 111/52   Immunization History  Administered Date(s) Administered  . H1N1 11/28/2007  . Influenza Whole 11/29/2006, 11/28/2007, 10/26/2008  . Influenza, Seasonal, Injecte, Preservative Fre 11/10/2012  . Influenza,inj,Quad PF,6+ Mos 11/03/2016, 11/09/2017, 10/04/2018  . Influenza-Unspecified 11/19/2011, 11/02/2013, 11/27/2019  . PFIZER(Purple Top)SARS-COV-2 Vaccination 05/05/2019, 05/29/2019,  02/26/2020  . Td 02/23/2008  . Tdap 12/20/2019  . Zoster Recombinat (Shingrix) 12/20/2019, 02/19/2020  There are no preventive care reminders to display for this patient.    Past Medical History:  Diagnosis Date  . ANXIETY 03/30/2007  . BACK PAIN 02/20/2009  . Essential hypertension, benign 05/31/2012  . Family history of anesthesia complication    "mother has hard time waking up"  . GERD 01/13/2007  . H/O hiatal hernia   . HYPERLIPIDEMIA 02/22/2008  . HYPOTHYROIDISM 03/30/2007  . MENOPAUSAL DISORDER 02/23/2008  . Nocturia   . OBSTRUCTIVE SLEEP APNEA 06/29/2007   has not used c pap x 8 months due to wt loss  . Osteoarthritis   . SINUSITIS- ACUTE-NOS 03/30/2007  . Sleep apnea   . THORACIC/LUMBOSACRAL NEURITIS/RADICULITIS UNSPEC 02/23/2008  . Wheezing 01/08/2009   Past Surgical History:  Procedure Laterality Date  . ABDOMINAL HYSTERECTOMY  2004   with BSO  . BREATH TEK H PYLORI N/A 05/05/2013   Procedure: BREATH TEK H PYLORI;  Surgeon: Shann Medal, MD;  Location: [facility];  Service: General;  Laterality: N/A;  . CHOLECYSTECTOMY  1980  . GASTRIC ROUX-EN-Y N/A 09/12/2013   Procedure: LAPAROSCOPIC ROUX-EN-Y GASTRIC BYPASS WITH UPPER ENDOSCOPY WITH ENTEROLYSIS OF ADHESIONS;  Surgeon: Shann Medal, MD;  Location: [facility];  Service: General;  Laterality: N/A;  . jaw bone surgury     60 yo  . SHOULDER ARTHROSCOPY WITH ROTATOR CUFF REPAIR AND SUBACROMIAL DECOMPRESSION Right  01/02/2020   Procedure: SHOULDER ARTHROSCOPY WITH ROTATOR CUFF REPAIR , SUBACROMIAL DECOMPRESSION,DISTAL CLAVICLE EXICISION;  Surgeon: Tania Ade, MD;  Location: [facility];  Service: Orthopedics;  Laterality: Right;  . TONSILLECTOMY      reports that she quit smoking about 22 years ago. She has never used smokeless tobacco. She reports current alcohol use. She reports that she does not use drugs. family history includes Arthritis in an other family member; Asthma in her brother; Breast cancer in her  maternal aunt; Cancer in her mother; Colon cancer in an other family member; Colon polyps (age of onset: 75) in her father; Diabetes in her mother and paternal grandmother; Heart disease in her paternal grandmother; Hypertension in her mother; Lymphoma in her maternal grandmother; Osteoporosis in an other family member; Stroke in her mother; Thyroid disease in her maternal grandmother and mother. Allergies  Allergen Reactions  . Codeine Nausea Only   Current Outpatient Medications on File Prior to Visit  Medication Sig Dispense Refill  . CALCIUM PO Take 5 mLs by mouth daily.    . clobetasol (TEMOVATE) 0.05 % external solution     . colchicine 0.6 MG tablet     . Cyanocobalamin (B-12 PO) Take 5 mLs by mouth as needed (low b12).    . cyclobenzaprine (FLEXERIL) 10 MG tablet cyclobenzaprine 10 mg tablet    . Multiple Vitamins-Minerals (MULTIVITAMIN ADULT PO) Take 1 tablet by mouth daily.    Marland Kitchen nystatin-triamcinolone (MYCOLOG II) cream Apply 1 application topically as needed. 30 g 1  . traMADol (ULTRAM) 50 MG tablet Take 1 tablet (50 mg total) by mouth every 6 (six) hours as needed. 60 tablet 3  . allopurinol (ZYLOPRIM) 100 MG tablet TAKE 2 TABLETS BY MOUTH ONCE A DAY 180 tablet 0  . ALPRAZolam (XANAX) 0.25 MG tablet TAKE 1 TABLET BY MOUTH TWICE DAILY AS NEEDED FOR ANXIETY 60 tablet 1  . carbamazepine (TEGRETOL XR) 100 MG 12 hr tablet TAKE 2 TABLETS BY MOUTH 2 TIMES DAILY 120 tablet 3  . clobetasol (TEMOVATE) 0.05 % external solution APPLY TOPICALLY 2 TIMES DAILY AS DIRECTED FOR 14 DAYS 50 mL 2  . clobetasol (TEMOVATE) 0.05 % external solution APPLY TO THE AFFECTED AREA(S) TWICE DAILY 50 mL 0  . gabapentin (NEURONTIN) 100 MG capsule TAKE 2 CAPSULES BY MOUTH AT BEDTIME 180 capsule 0  . levothyroxine (SYNTHROID) 88 MCG tablet TAKE 1 TABLET BY MOUTH EVERY MORNING 90 tablet 3  . omeprazole (PRILOSEC) 20 MG capsule TAKE 2 CAPSULES BY MOUTH DAILY. 180 capsule 1  . ondansetron (ZOFRAN) 4 MG tablet TAKE 1  TABLET BY MOUTH ONCE A DAY AS NEEDED FOR NAUSEA OR VOMITING 30 tablet 1  . tiZANidine (ZANAFLEX) 4 MG tablet TAKE 1 TABLET BY MOUTH EVERY 8 HOURS AS NEEDED FOR MUSCLE SPASMS 30 tablet 1  . topiramate (TOPAMAX) 50 MG tablet TAKE 1 TABLET BY MOUTH ONCE A DAY 90 tablet 3  . topiramate (TOPAMAX) 50 MG tablet TAKE 1 TABLET BY MOUTH ONCE DAILY 30 tablet 3   No current facility-administered medications on file prior to visit.        ROS:  All others reviewed and negative.  Objective        PE:  BP 112/70 (BP Location: Right Arm, Patient Position: Sitting, Cuff Size: Large)   Pulse 66   Temp 98 F (36.7 C) (Oral)   Ht 5\' 4"  (1.626 m)   Wt 188 lb (85.3 kg)   SpO2 99%   BMI  32.27 kg/m                 Constitutional: Pt appears in NAD               HENT: Head: NCAT.                Right Ear: External ear normal.                 Left Ear: External ear normal.                Eyes: . Pupils are equal, round, and reactive to light. Conjunctivae and EOM are normal               Nose: without d/c or deformity               Neck: Neck supple. Gross normal ROM               Cardiovascular: Normal rate and regular rhythm.                 Pulmonary/Chest: Effort normal and breath sounds without rales or wheezing.                Abd:  Soft, NT, ND, + BS, no organomegaly               Neurological: Pt is alert. At baseline orientation, motor grossly intact               Skin: Skin is warm. No rashes, no other new lesions but has several brusing to arms and legs, LE edema - 1+ bilat               Psychiatric: Pt behavior is normal without agitation , nervous/depressed  Micro: none  Cardiac tracings I have personally interpreted today:  none  Pertinent Radiological findings (summarize): none   Lab Results  Component Value Date   WBC 4.2 04/29/2020   HGB 11.1 (L) 04/29/2020   HCT 34.6 (L) 04/29/2020   PLT 378 04/29/2020   GLUCOSE 81 04/29/2020   CHOL 243 (H) 04/23/2020   TRIG 112.0 04/23/2020    HDL 101.60 04/23/2020   LDLDIRECT 94.8 04/29/2012   LDLCALC 119 (H) 04/23/2020   ALT 13 04/29/2020   AST 18 04/29/2020   NA 141 04/29/2020   K 4.3 04/29/2020   CL 109 04/29/2020   CREATININE 0.62 04/29/2020   BUN 17 04/29/2020   CO2 22 04/29/2020   TSH 0.69 04/23/2020   INR 0.9 04/23/2020   HGBA1C 5.9 04/23/2020   Assessment/Plan:  Kelli Bell is a 61 y.o. White or Caucasian [1] female with  has a past medical history of ANXIETY (03/30/2007), BACK PAIN (02/20/2009), Essential hypertension, benign (05/31/2012), Family history of anesthesia complication, GERD (27/74/1287), H/O hiatal hernia, HYPERLIPIDEMIA (02/22/2008), HYPOTHYROIDISM (03/30/2007), MENOPAUSAL DISORDER (02/23/2008), Nocturia, OBSTRUCTIVE SLEEP APNEA (06/29/2007), Osteoarthritis, SINUSITIS- ACUTE-NOS (03/30/2007), Sleep apnea, THORACIC/LUMBOSACRAL NEURITIS/RADICULITIS UNSPEC (02/23/2008), and Wheezing (01/08/2009).  Encounter for well adult exam with abnormal findings Age and sex appropriate education and counseling updated with regular exercise and diet Referrals for preventative services - none needed new today, has mammogram to be scheduled per pt Immunizations addressed - none needed today Smoking counseling  - none needed Evidence for depression or other mood disorder - for cymbalta as below Most recent labs reviewed. I have personally reviewed and have noted: 1) the patient's medical and social history 2) The patient's current medications and supplements 3) The patient's height, weight,  and BMI have been recorded in the chart   Peripheral edema ? Venous insufficiency, for bnp, cxr, leg elevation, low salt diet, consider diuretic  Hyperlipidemia Lab Results  Component Value Date   LDLCALC 119 (H) 04/23/2020   Stable, pt to continue current statin  - diet, declines statin'  Blood pressure elevated without history of HTN BP Readings from Last 3 Encounters:  04/29/20 125/75  04/23/20 112/70  04/11/20 (!) 111/52    Stable, pt to continue medical treatment   - none currently   Easy bruising Etiology unclear, also for PT/INR r/o coagulopathy  Depression Uncontrolled, for increased cymbalta 60 bid  Hypothyroidism Lab Results  Component Value Date   TSH 0.69 04/23/2020   Stable, pt to continue levothyroxine  Hyperglycemia Lab Results  Component Value Date   HGBA1C 5.9 04/23/2020   Stable, pt to continue current medical treatment  - diet   Followup: Return in about 6 months (around 10/24/2020).  Cathlean Cower, MD 04/30/2020 8:38 PM [facility]

## 2020-04-24 ENCOUNTER — Encounter: Payer: Self-pay | Admitting: Internal Medicine

## 2020-04-24 ENCOUNTER — Ambulatory Visit (INDEPENDENT_AMBULATORY_CARE_PROVIDER_SITE_OTHER)
Admission: RE | Admit: 2020-04-24 | Discharge: 2020-04-24 | Disposition: A | Discharge status: Home / Self Care | Payer: 59 | Source: Ambulatory Visit | Attending: Gastroenterology | Admitting: Gastroenterology

## 2020-04-24 ENCOUNTER — Other Ambulatory Visit: Payer: BC Managed Care – PPO

## 2020-04-24 ENCOUNTER — Ambulatory Visit: Payer: BC Managed Care – PPO | Admitting: Hematology and Oncology

## 2020-04-24 DIAGNOSIS — R1013 Epigastric pain: Secondary | ICD-10-CM | POA: Diagnosis not present

## 2020-04-24 LAB — URINALYSIS, ROUTINE W REFLEX MICROSCOPIC
Bilirubin Urine: NEGATIVE
Hgb urine dipstick: NEGATIVE
Ketones, ur: NEGATIVE
Nitrite: NEGATIVE
RBC / HPF: NONE SEEN (ref 0–?)
Specific Gravity, Urine: 1.02 (ref 1.000–1.030)
Total Protein, Urine: NEGATIVE
Urine Glucose: NEGATIVE
Urobilinogen, UA: 0.2 (ref 0.0–1.0)
pH: 5.5 (ref 5.0–8.0)

## 2020-04-24 MED ORDER — IOHEXOL 300 MG/ML  SOLN
100.0000 mL | Freq: Once | INTRAMUSCULAR | Status: AC | PRN
  Administered 2020-04-24: 100 mL via INTRAVENOUS

## 2020-04-25 ENCOUNTER — Encounter: Payer: Self-pay | Admitting: Internal Medicine

## 2020-04-26 ENCOUNTER — Other Ambulatory Visit: Payer: Self-pay | Admitting: Gastroenterology

## 2020-04-26 DIAGNOSIS — R1010 Upper abdominal pain, unspecified: Secondary | ICD-10-CM

## 2020-04-26 MED ORDER — HYOSCYAMINE SULFATE 0.125 MG SL SUBL: 0.1250 mg | SUBLINGUAL_TABLET | Freq: Four times a day (QID) | SUBLINGUAL | 2 refills | Status: DC | PRN

## 2020-04-27 ENCOUNTER — Other Ambulatory Visit (HOSPITAL_COMMUNITY): Payer: Self-pay

## 2020-04-27 ENCOUNTER — Other Ambulatory Visit (HOSPITAL_BASED_OUTPATIENT_CLINIC_OR_DEPARTMENT_OTHER): Payer: Self-pay

## 2020-04-27 MED FILL — Hyoscyamine Sulfate SL Tab 0.125 MG: SUBLINGUAL | 30 days supply | Qty: 30 | Fill #0 | Status: AC

## 2020-04-28 ENCOUNTER — Other Ambulatory Visit: Payer: Self-pay | Admitting: Hematology and Oncology

## 2020-04-28 DIAGNOSIS — M899 Disorder of bone, unspecified: Secondary | ICD-10-CM

## 2020-04-28 NOTE — Progress Notes (Signed)
[place] [phone]   [phone]  PROGRESS NOTE  Patient Care Team: Biagio Borg, MD as PCP - Burns Spain, Lowella Bandy, MD (Inactive) as Consulting Physician (Gastroenterology) Huel Cote, NP (Inactive) as Nurse Practitioner (Obstetrics and Gynecology) Arbutus Leas, PhD as Consulting Physician (Psychology)  Hematological/Oncological History # Lytic Bone Lesions, Concern for Multiple Myeloma 1) 03/30/2019: patient had shoulder X-ray performed due to chronic right shoulder pain. Imaging showed scattered proximal lucency suspicious for myeloma 2) 04/12/2019: establish care with Dr. Lorenso Courier  3) 04/19/2020: DG bone survey showed no definitive lytic or sclerotic lesions are identified  Interval History:  Kelli Bell 60 y.o. female with medical history significant for a lytic bone lesion of unclear etiology who presents for a follow up visit. The patient's last visit was on 04/13/2019. In the interim since the last visit he had a full multiple myeloma work-up as well as a bone survey which showed no evidence of lytic lesions (including the original lesion noted in the humerus) and no lab abnormalities.  On exam today Kelli Bell notes she is having eventful several months since her last visit.  She is currently being worked up by Dr. Simona Huh for her abdominal discomfort.  There was concern that she may have irritable bowel syndrome and she has an MRI coming up for a spot noted on her liver.  She is also concerned about some bruising she has been having on her extremities.  She notes that you can "just barely touch her" and she will get a bruise.  She will have any bruising on the torso and other overt signs of bleeding elsewhere.  She does not have any GYN bleeding and she underwent a hysterectomy in 2002.  She is currently being treated with levothyroxine by her OB/GYN provider  She is also somewhat concerned about a positive ANA that she has had and was worked up  by rheumatology at [facility].  They believe her diagnosis is fibromyalgia this time.  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  She otherwise denies any fevers, chills, sweats, nausea, vomiting or diarrhea.  She denies any weight loss.  A full 10 point ROS is listed below.  MEDICAL HISTORY:  Past Medical History:  Diagnosis Date  . ANXIETY 03/30/2007  . BACK PAIN 02/20/2009  . Essential hypertension, benign 05/31/2012  . Family history of anesthesia complication    "mother has hard time waking up"  . GERD 01/13/2007  . H/O hiatal hernia   . HYPERLIPIDEMIA 02/22/2008  . HYPOTHYROIDISM 03/30/2007  . MENOPAUSAL DISORDER 02/23/2008  . Nocturia   . OBSTRUCTIVE SLEEP APNEA 06/29/2007   has not used c pap x 8 months due to wt loss  . Osteoarthritis   . SINUSITIS- ACUTE-NOS 03/30/2007  . Sleep apnea   . THORACIC/LUMBOSACRAL NEURITIS/RADICULITIS UNSPEC 02/23/2008  . Wheezing 01/08/2009    SURGICAL HISTORY: Past Surgical History:  Procedure Laterality Date  . ABDOMINAL HYSTERECTOMY  2004   with BSO  . BREATH TEK H PYLORI N/A 05/05/2013   Procedure: BREATH TEK H PYLORI;  Surgeon: Shann Medal, MD;  Location: [facility];  Service: General;  Laterality: N/A;  . CHOLECYSTECTOMY  1980  . GASTRIC ROUX-EN-Y N/A 09/12/2013   Procedure: LAPAROSCOPIC ROUX-EN-Y GASTRIC BYPASS WITH UPPER ENDOSCOPY WITH ENTEROLYSIS OF ADHESIONS;  Surgeon: Shann Medal, MD;  Location: [facility];  Service: General;  Laterality: N/A;  . jaw bone surgury  60 yo  . SHOULDER ARTHROSCOPY WITH ROTATOR CUFF REPAIR AND SUBACROMIAL DECOMPRESSION Right 01/02/2020   Procedure: SHOULDER ARTHROSCOPY WITH ROTATOR CUFF REPAIR , SUBACROMIAL DECOMPRESSION,DISTAL CLAVICLE EXICISION;  Surgeon: Tania Ade, MD;  Location: [facility];  Service: Orthopedics;  Laterality: Right;  . TONSILLECTOMY      SOCIAL HISTORY: Social History   Socioeconomic History  .  Marital status: Widowed    Spouse name: Not on file  . Number of children: Not on file  . Years of education: Not on file  . Highest education level: Not on file  Occupational History  . Not on file  Tobacco Use  . Smoking status: Former Smoker    Quit date: 07/06/1997    Years since quitting: 22.8  . Smokeless tobacco: Never Used  Vaping Use  . Vaping Use: Never used  Substance and Sexual Activity  . Alcohol use: Yes    Comment: [place]  . Drug use: No  . Sexual activity: Not Currently    Birth control/protection: Surgical    Comment: intercourse age 57, less than 5 sexual partners,des neg  Other Topics Concern  . Not on file  Social History Narrative  . Not on file   Social Determinants of Health   Financial Resource Strain: Not on file  Food Insecurity: Not on file  Transportation Needs: Not on file  Physical Activity: Not on file  Stress: Not on file  Social Connections: Not on file  Intimate Partner Violence: Not on file    FAMILY HISTORY: Family History  Problem Relation Age of Onset  . Stroke Mother   . Hypertension Mother   . Diabetes Mother   . Cancer Mother        basel cell cancer  . Thyroid disease Mother   . Arthritis Other   . Colon polyps Father 67       "CANCEROUS POLYPS REMOVED THIS YEAR"  . Asthma Brother   . Thyroid disease Maternal Grandmother   . Lymphoma Maternal Grandmother   . Osteoporosis Other   . Colon cancer Other   . Breast cancer Maternal Aunt        mat great aunt  . Diabetes Paternal Grandmother   . Heart disease Paternal Grandmother     ALLERGIES:  is allergic to codeine.  MEDICATIONS:  Current Outpatient Medications  Medication Sig Dispense Refill  . allopurinol (ZYLOPRIM) 100 MG tablet TAKE 2 TABLETS BY MOUTH ONCE A DAY 180 tablet 0  . ALPRAZolam (XANAX) 0.25 MG tablet TAKE 1 TABLET BY MOUTH TWICE DAILY AS NEEDED FOR ANXIETY 60 tablet 1  . CALCIUM PO Take 5 mLs by mouth daily.    . carbamazepine  (TEGRETOL XR) 100 MG 12 hr tablet TAKE 2 TABLETS BY MOUTH 2 TIMES DAILY 120 tablet 3  . clobetasol (TEMOVATE) 0.05 % external solution     . clobetasol (TEMOVATE) 0.05 % external solution APPLY TOPICALLY 2 TIMES DAILY AS DIRECTED FOR 14 DAYS 50 mL 2  . clobetasol (TEMOVATE) 0.05 % external solution APPLY TO THE AFFECTED AREA(S) TWICE DAILY 50 mL 0  . colchicine 0.6 MG tablet     . Cyanocobalamin (B-12 PO) Take 5 mLs by mouth as needed (low b12).    . cyclobenzaprine (FLEXERIL) 10 MG tablet cyclobenzaprine 10 mg tablet    . DULoxetine (CYMBALTA) 60 MG capsule TAKE 2 CAPSULES BY MOUTH DAILY 180 capsule 3  . gabapentin (NEURONTIN) 100 MG capsule TAKE 2 CAPSULES BY MOUTH AT BEDTIME 180  capsule 0  . hyoscyamine (LEVSIN SL) 0.125 MG SL tablet PLACE 1 TABLET UNDER THE TONGUE EVERY 6 HOURS AS NEEDED 30 tablet 2  . levothyroxine (SYNTHROID) 88 MCG tablet TAKE 1 TABLET BY MOUTH EVERY MORNING 90 tablet 3  . Multiple Vitamins-Minerals (MULTIVITAMIN ADULT PO) Take 1 tablet by mouth daily.    Marland Kitchen nystatin-triamcinolone (MYCOLOG II) cream Apply 1 application topically as needed. 30 g 1  . omeprazole (PRILOSEC) 20 MG capsule TAKE 2 CAPSULES BY MOUTH DAILY. 180 capsule 1  . ondansetron (ZOFRAN) 4 MG tablet TAKE 1 TABLET BY MOUTH ONCE A DAY AS NEEDED FOR NAUSEA OR VOMITING 30 tablet 1  . tiZANidine (ZANAFLEX) 4 MG tablet TAKE 1 TABLET BY MOUTH EVERY 8 HOURS AS NEEDED FOR MUSCLE SPASMS 30 tablet 1  . topiramate (TOPAMAX) 50 MG tablet TAKE 1 TABLET BY MOUTH ONCE A DAY 90 tablet 3  . topiramate (TOPAMAX) 50 MG tablet TAKE 1 TABLET BY MOUTH ONCE DAILY 30 tablet 3  . traMADol (ULTRAM) 50 MG tablet Take 1 tablet (50 mg total) by mouth every 6 (six) hours as needed. 60 tablet 3   No current facility-administered medications for this visit.    REVIEW OF SYSTEMS:   Constitutional: ( - ) fevers, ( - )  chills , ( - ) night sweats Eyes: ( - ) blurriness of vision, ( - ) double vision, ( - ) watery eyes Ears, nose, mouth,  throat, and face: ( - ) mucositis, ( - ) sore throat Respiratory: ( - ) cough, ( - ) dyspnea, ( - ) wheezes Cardiovascular: ( - ) palpitation, ( - ) chest discomfort, ( - ) lower extremity swelling Gastrointestinal:  ( - ) nausea, ( - ) heartburn, ( - ) change in bowel habits Skin: ( - ) abnormal skin rashes Lymphatics: ( - ) new lymphadenopathy, ( - ) easy bruising Neurological: ( - ) numbness, ( - ) tingling, ( - ) new weaknesses Behavioral/Psych: ( - ) mood change, ( - ) new changes  All other systems were reviewed with the patient and are negative.  PHYSICAL EXAMINATION:  Vitals:   04/29/20 0922  BP: 125/75  Pulse: 71  Resp: 17  Temp: (!) 96.1 F (35.6 C)  SpO2: 98%   Filed Weights   04/29/20 0922  Weight: 190 lb (86.2 kg)    GENERAL: well appearing middle aged Caucasian female alert, no distress and comfortable SKIN: skin color, texture, turgor are normal, no rashes or significant lesions EYES: conjunctiva are pink and non-injected, sclera clear LUNGS: clear to auscultation and percussion with normal breathing effort HEART: regular rate & rhythm and no murmurs and no lower extremity edema Musculoskeletal: no cyanosis of digits and no clubbing  PSYCH: alert & oriented x 3, fluent speech NEURO: no focal motor/sensory deficits  LABORATORY DATA:  I have reviewed the data as listed CBC Latest Ref Rng & Units 04/29/2020 04/23/2020 12/18/2019  WBC 4.0 - 10.5 K/uL 4.2 4.2 5.1  Hemoglobin 12.0 - 15.0 g/dL 11.1(L) 11.5(L) 12.5  Hematocrit 36.0 - 46.0 % 34.6(L) 34.8(L) 37.7  Platelets 150 - 400 K/uL 378 388.0 358.0    CMP Latest Ref Rng & Units 04/29/2020 04/23/2020 04/22/2020  Glucose 70 - 99 mg/dL 81 92 -  BUN 6 - 20 mg/dL '17 8 7  ' Creatinine 0.44 - 1.00 mg/dL 0.62 0.61 0.53  Sodium 135 - 145 mmol/L 141 140 -  Potassium 3.5 - 5.1 mmol/L 4.3 4.6 -  Chloride 98 -  111 mmol/L 109 106 -  CO2 22 - 32 mmol/L 22 28 -  Calcium 8.9 - 10.3 mg/dL 8.5(L) 8.6 -  Total Protein 6.5 - 8.1  g/dL 6.3(L) 6.4 -  Total Bilirubin 0.3 - 1.2 mg/dL <0.2(L) 0.2 -  Alkaline Phos 38 - 126 U/L 111 101 -  AST 15 - 41 U/L 18 17 -  ALT 0 - 44 U/L 13 11 -    Lab Results  Component Value Date   MPROTEIN Not Observed 04/13/2019   Lab Results  Component Value Date   KPAFRELGTCHN 10.4 04/13/2019   LAMBDASER 8.8 04/13/2019   KAPLAMBRATIO >5.60 04/17/2019   KAPLAMBRATIO 1.18 04/13/2019     RADIOGRAPHIC STUDIES:  DG Chest 2 View  Result Date: 04/24/2020 CLINICAL DATA:  Peripheral edema, hypertension EXAM: CHEST - 2 VIEW COMPARISON:  10/04/2018 FINDINGS: The heart size and mediastinal contours are within normal limits. Both lungs are clear. The visualized skeletal structures are unremarkable except for mild degenerative changes of the thoracic spine. Trachea midline. Remote cholecystectomy. IMPRESSION: No active cardiopulmonary disease. Electronically Signed   By: Jerilynn Mages.  Shick M.D.   On: 04/24/2020 09:44   CT Abdomen Pelvis W Contrast  Result Date: 04/25/2020 CLINICAL DATA:  Epigastric pain.  History of gastric bypass. EXAM: CT ABDOMEN AND PELVIS WITH CONTRAST TECHNIQUE: Multidetector CT imaging of the abdomen and pelvis was performed using the standard protocol following bolus administration of intravenous contrast. CONTRAST:  182m OMNIPAQUE IOHEXOL 300 MG/ML  SOLN COMPARISON:  None. FINDINGS: Lower chest: Unremarkable. Hepatobiliary: 1.8 cm ill-defined hypoattenuating lesion identified in posterior segment IV, potentially focal fatty deposition. Gallbladder surgically absent. No intrahepatic or extrahepatic biliary dilation. Pancreas: No focal mass lesion. No dilatation of the main duct. No intraparenchymal cyst. No peripancreatic edema. Spleen: No splenomegaly. No focal mass lesion. Adrenals/Urinary Tract: No adrenal nodule or mass. 12 mm hypodensity lower pole right kidney is compatible with a cyst. Central sinus cysts noted in both kidneys No evidence for hydroureter. The urinary bladder appears  normal for the degree of distention. Stomach/Bowel: Status post gastric bypass with associated small hiatal hernia. Roux limb appears widely patent at the anastomosis and is nondilated. No oral contrast material in the excluded portion of the stomach. Biliary limb is nondilated. No small bowel wall thickening. No small bowel dilatation. The terminal ileum is normal. The appendix is not well visualized, but there is no edema or inflammation in the region of the cecum. No gross colonic mass. No colonic wall thickening. Vascular/Lymphatic: No abdominal aortic aneurysm. No abdominal aortic atherosclerotic calcification. There is no gastrohepatic or hepatoduodenal ligament lymphadenopathy. No retroperitoneal or mesenteric lymphadenopathy. No pelvic sidewall lymphadenopathy. Reproductive: The uterus is surgically absent. There is no adnexal mass. Other: No intraperitoneal free fluid. Musculoskeletal: No worrisome lytic or sclerotic osseous abnormality. IMPRESSION: 1. No acute findings in the abdomen or pelvis. Specifically, no findings to explain the patient's history of epigastric pain. 2. Status post gastric bypass with associated small hiatal hernia. Roux limb appears widely patent at the gastroenteric anastomosis and is nondilated. No oral contrast material in the excluded portion of the stomach. 3. 1.8 cm ill-defined hypoattenuating lesion in posterior segment IV, likely focal fatty deposition. MRI of the abdomen without and with contrast recommended to further evaluate. Electronically Signed   By: EMisty StanleyM.D.   On: 04/25/2020 11:21    ASSESSMENT & PLAN SBirgitta [id number].[id number]. female with medical history significant for a lytic bone lesion of unclear etiology who presents for a  follow up visit.  After review the labs, review the records, discussed with the patient the findings most consistent with an imaging abnormality which showed lytic lesions where there are none.  The most recent DG bone met survey on  04/20/2019 showed no definitive lytic or sclerotic lesions in the reported that the previously noted lesions on the humerus were not present.  As such today we will repeat our monoclonal colopathy studies and also repeat the DG bone survey in order to assure there are truly no lytic lesions.  Assuming the studies are normal we recommend the patient follow-up with [place] on an as-needed basis moving forward.  # Lytic Bone Lesions, Concern for Multiple Myeloma --today will order a CBC, CMP, and LDH --additionally will order an SPEP and SFLC.  --at this time it is reassuring the patient has normal Hgb, Cr, Ca on prior labs  --prior DG Bone survey in 2021 showed no lytic lesions, including at the site it was previously noted. Will repeat today -- patient is up to date on cancer screenings including breast cancer and colon cancer. No focal symptoms concerning for malignancy elsewhere --If no signs of lytic lesions or monoclonal gammopathy are noted we can have patient return to clinic on an as needed basis moving forward.   Orders Placed This Encounter  Procedures  . DG Bone Survey Met    Standing Status:   Future    Standing Expiration Date:   04/29/2021    Order Specific Question:   Reason for Exam (SYMPTOM  OR DIAGNOSIS REQUIRED)    Answer:   r/o lytic lesions, previously noted in r. humerus    Order Specific Question:   Is patient pregnant?    Answer:   No    Order Specific Question:   Preferred imaging location?    Answer:   [facility]    All questions were answered. The patient knows to call the clinic with any problems, questions or concerns.  A total of more than 30 minutes were spent on this encounter and over half of that time was spent on counseling and coordination of care as outlined above.   Ledell Peoples, MD Department of Hematology/Oncology [facility] Phone: [phone] Pager: [phone] Email:  [email or web address]  04/29/2020 10:17 AM

## 2020-04-29 ENCOUNTER — Other Ambulatory Visit: Payer: Self-pay

## 2020-04-29 ENCOUNTER — Inpatient Hospital Stay: Payer: 59 | Attending: Hematology and Oncology

## 2020-04-29 ENCOUNTER — Encounter: Payer: Self-pay | Admitting: Hematology and Oncology

## 2020-04-29 ENCOUNTER — Inpatient Hospital Stay (HOSPITAL_BASED_OUTPATIENT_CLINIC_OR_DEPARTMENT_OTHER): Payer: 59 | Admitting: Hematology and Oncology

## 2020-04-29 ENCOUNTER — Telehealth: Payer: Self-pay | Admitting: Gastroenterology

## 2020-04-29 VITALS — BP 125/75 | HR 71 | Temp 96.1°F | Resp 17 | Wt 190.0 lb

## 2020-04-29 DIAGNOSIS — Z87891 Personal history of nicotine dependence: Secondary | ICD-10-CM | POA: Insufficient documentation

## 2020-04-29 DIAGNOSIS — M899 Disorder of bone, unspecified: Secondary | ICD-10-CM | POA: Insufficient documentation

## 2020-04-29 DIAGNOSIS — K769 Liver disease, unspecified: Secondary | ICD-10-CM

## 2020-04-29 LAB — CBC WITH DIFFERENTIAL (CANCER CENTER ONLY)
Abs Immature Granulocytes: 0.02 10*3/uL (ref 0.00–0.07)
Basophils Absolute: 0.1 10*3/uL (ref 0.0–0.1)
Basophils Relative: 2 %
Eosinophils Absolute: 0.1 10*3/uL (ref 0.0–0.5)
Eosinophils Relative: 2 %
HCT: 34.6 % — ABNORMAL LOW (ref 36.0–46.0)
Hemoglobin: 11.1 g/dL — ABNORMAL LOW (ref 12.0–15.0)
Immature Granulocytes: 1 %
Lymphocytes Relative: 37 %
Lymphs Abs: 1.6 10*3/uL (ref 0.7–4.0)
MCH: 29.2 pg (ref 26.0–34.0)
MCHC: 32.1 g/dL (ref 30.0–36.0)
MCV: 91.1 fL (ref 80.0–100.0)
Monocytes Absolute: 0.6 10*3/uL (ref 0.1–1.0)
Monocytes Relative: 15 %
Neutro Abs: 1.9 10*3/uL (ref 1.7–7.7)
Neutrophils Relative %: 43 %
Platelet Count: 378 10*3/uL (ref 150–400)
RBC: 3.8 MIL/uL — ABNORMAL LOW (ref 3.87–5.11)
RDW: 13.4 % (ref 11.5–15.5)
WBC Count: 4.2 10*3/uL (ref 4.0–10.5)
nRBC: 0 % (ref 0.0–0.2)

## 2020-04-29 LAB — CMP (CANCER CENTER ONLY)
ALT: 13 U/L (ref 0–44)
AST: 18 U/L (ref 15–41)
Albumin: 3.5 g/dL (ref 3.5–5.0)
Alkaline Phosphatase: 111 U/L (ref 38–126)
Anion gap: 10 (ref 5–15)
BUN: 17 mg/dL (ref 6–20)
CO2: 22 mmol/L (ref 22–32)
Calcium: 8.5 mg/dL — ABNORMAL LOW (ref 8.9–10.3)
Chloride: 109 mmol/L (ref 98–111)
Creatinine: 0.62 mg/dL (ref 0.44–1.00)
GFR, Estimated: >60 mL/min (ref >60)
Glucose, Bld: 81 mg/dL (ref 70–99)
Potassium: 4.3 mmol/L (ref 3.5–5.1)
Sodium: 141 mmol/L (ref 135–145)
Total Bilirubin: <0.2 mg/dL — ABNORMAL LOW (ref 0.3–1.2)
Total Protein: 6.3 g/dL — ABNORMAL LOW (ref 6.5–8.1)

## 2020-04-29 LAB — LACTATE DEHYDROGENASE: LDH: 198 U/L — ABNORMAL HIGH (ref 98–192)

## 2020-04-29 NOTE — Telephone Encounter (Signed)
Pls call pt. She needs to schedule MRI.

## 2020-04-29 NOTE — Telephone Encounter (Signed)
Patient has been scheduled for an MRI liver at [facility] on Sunday, 05/05/20 at 1 PM, she will need at arrive at 12:30 PM. NPO 4 hours prior. Patient is aware of appointment information and instructions. She verbalized understanding and had no concerns at the end of the call.

## 2020-04-30 ENCOUNTER — Encounter: Payer: Self-pay | Admitting: Internal Medicine

## 2020-04-30 DIAGNOSIS — R739 Hyperglycemia, unspecified: Secondary | ICD-10-CM | POA: Insufficient documentation

## 2020-04-30 LAB — KAPPA/LAMBDA LIGHT CHAINS
Kappa free light chain: 11.8 mg/L (ref 3.3–19.4)
Kappa, lambda light chain ratio: 1.31 (ref 0.26–1.65)
Lambda free light chains: 9 mg/L (ref 5.7–26.3)

## 2020-04-30 NOTE — Assessment & Plan Note (Signed)
BP Readings from Last 3 Encounters:  04/29/20 125/75  04/23/20 112/70  04/11/20 (!) 111/52   Stable, pt to continue medical treatment   - none currently

## 2020-04-30 NOTE — Assessment & Plan Note (Signed)
Age and sex appropriate education and counseling updated with regular exercise and diet Referrals for preventative services - none needed new today, has mammogram to be scheduled per pt Immunizations addressed - none needed today Smoking counseling  - none needed Evidence for depression or other mood disorder - for cymbalta as below Most recent labs reviewed. I have personally reviewed and have noted: 1) the patient's medical and social history 2) The patient's current medications and supplements 3) The patient's height, weight, and BMI have been recorded in the chart

## 2020-04-30 NOTE — Assessment & Plan Note (Signed)
Uncontrolled, for increased cymbalta 60 bid

## 2020-04-30 NOTE — Assessment & Plan Note (Signed)
?   Venous insufficiency, for bnp, cxr, leg elevation, low salt diet, consider diuretic

## 2020-04-30 NOTE — Assessment & Plan Note (Signed)
Lab Results  Component Value Date   LDLCALC 119 (H) 04/23/2020   Stable, pt to continue current statin  - diet, declines statin'

## 2020-04-30 NOTE — Assessment & Plan Note (Signed)
Lab Results  Component Value Date   TSH 0.69 04/23/2020   Stable, pt to continue levothyroxine

## 2020-04-30 NOTE — Assessment & Plan Note (Signed)
Lab Results  Component Value Date   HGBA1C 5.9 04/23/2020   Stable, pt to continue current medical treatment  - diet

## 2020-04-30 NOTE — Assessment & Plan Note (Signed)
Etiology unclear, also for PT/INR r/o coagulopathy

## 2020-05-02 ENCOUNTER — Other Ambulatory Visit (HOSPITAL_COMMUNITY): Payer: Self-pay

## 2020-05-03 LAB — MULTIPLE MYELOMA PANEL, SERUM
Albumin SerPl Elph-Mcnc: 3.4 g/dL (ref 2.9–4.4)
Albumin/Glob SerPl: 1.4 (ref 0.7–1.7)
Alpha 1: 0.3 g/dL (ref 0.0–0.4)
Alpha2 Glob SerPl Elph-Mcnc: 0.8 g/dL (ref 0.4–1.0)
B-Globulin SerPl Elph-Mcnc: 1 g/dL (ref 0.7–1.3)
Gamma Glob SerPl Elph-Mcnc: 0.4 g/dL (ref 0.4–1.8)
Globulin, Total: 2.5 g/dL (ref 2.2–3.9)
IgA: 121 mg/dL (ref 87–352)
IgG (Immunoglobin G), Serum: 534 mg/dL — ABNORMAL LOW (ref 586–1602)
IgM (Immunoglobulin M), Srm: 27 mg/dL (ref 26–217)
Total Protein ELP: 5.9 g/dL — ABNORMAL LOW (ref 6.0–8.5)

## 2020-05-04 MED FILL — Levothyroxine Sodium Tab 88 MCG: ORAL | 90 days supply | Qty: 90 | Fill #0 | Status: AC

## 2020-05-04 MED FILL — Alprazolam Tab 0.25 MG: ORAL | 30 days supply | Qty: 60 | Fill #0 | Status: AC

## 2020-05-04 MED FILL — Omeprazole Cap Delayed Release 20 MG: ORAL | 90 days supply | Qty: 180 | Fill #0 | Status: AC

## 2020-05-05 ENCOUNTER — Ambulatory Visit (HOSPITAL_COMMUNITY)
Admission: RE | Admit: 2020-05-05 | Discharge: 2020-05-05 | Disposition: A | Discharge status: Home / Self Care | Payer: 59 | Source: Ambulatory Visit | Attending: Gastroenterology | Admitting: Gastroenterology

## 2020-05-05 ENCOUNTER — Other Ambulatory Visit: Payer: Self-pay

## 2020-05-05 DIAGNOSIS — K769 Liver disease, unspecified: Secondary | ICD-10-CM | POA: Diagnosis not present

## 2020-05-05 MED ORDER — GADOBUTROL 1 MMOL/ML IV SOLN
8.0000 mL | Freq: Once | INTRAVENOUS | Status: AC | PRN
  Administered 2020-05-05: 8 mL via INTRAVENOUS

## 2020-05-06 ENCOUNTER — Other Ambulatory Visit (HOSPITAL_COMMUNITY): Payer: Self-pay

## 2020-05-06 MED FILL — Allopurinol Tab 100 MG: ORAL | 90 days supply | Qty: 180 | Fill #0 | Status: AC

## 2020-05-07 ENCOUNTER — Other Ambulatory Visit (HOSPITAL_COMMUNITY): Payer: Self-pay

## 2020-05-07 ENCOUNTER — Telehealth: Payer: Self-pay | Admitting: Internal Medicine

## 2020-05-07 DIAGNOSIS — H9190 Unspecified hearing loss, unspecified ear: Secondary | ICD-10-CM

## 2020-05-07 DIAGNOSIS — J329 Chronic sinusitis, unspecified: Secondary | ICD-10-CM

## 2020-05-07 NOTE — Telephone Encounter (Signed)
I think this was for chronic sinus issue - I will refer

## 2020-05-07 NOTE — Telephone Encounter (Signed)
Patient called and is requesting a referral for an ENT. She said she spoke to Dr. Jenny Reichmann about it at her last OV. She can be reached at [phone]. Please advise

## 2020-05-08 ENCOUNTER — Other Ambulatory Visit (HOSPITAL_COMMUNITY): Payer: Self-pay

## 2020-05-09 NOTE — Telephone Encounter (Signed)
Patient notified of referral via voicemail.

## 2020-05-10 ENCOUNTER — Ambulatory Visit (HOSPITAL_COMMUNITY)
Admission: RE | Admit: 2020-05-10 | Discharge: 2020-05-10 | Disposition: A | Discharge status: Home / Self Care | Payer: 59 | Source: Ambulatory Visit | Attending: Hematology and Oncology | Admitting: Hematology and Oncology

## 2020-05-10 ENCOUNTER — Other Ambulatory Visit: Payer: Self-pay

## 2020-05-10 DIAGNOSIS — M899 Disorder of bone, unspecified: Secondary | ICD-10-CM | POA: Insufficient documentation

## 2020-05-13 NOTE — Telephone Encounter (Signed)
Ok to forward to pcc's  If not able, we can refer to audiology directly

## 2020-05-13 NOTE — Telephone Encounter (Signed)
Patient called and said that Dr. Lucia Gaskins does not doing hearing test. She was wondering if she could get a referral to a doctor that does them. She can be reached at [phone]. Please advise

## 2020-05-14 NOTE — Telephone Encounter (Signed)
Referral faxed to [facility] 

## 2020-05-14 NOTE — Telephone Encounter (Signed)
Can you please put in referral for audiology? Previous referral was for chronic sinusitis - not hearing loss Thanks

## 2020-05-14 NOTE — Addendum Note (Signed)
Addended by: Biagio Borg on: 05/14/2020 09:52 AM   Modules accepted: Orders

## 2020-05-17 ENCOUNTER — Telehealth: Payer: Self-pay | Admitting: *Deleted

## 2020-05-17 NOTE — Telephone Encounter (Signed)
-----  Message from Orson Slick, MD sent at 05/14/2020 11:19 AM EDT ----- Please let Mrs. Lindvall know that her X-ray and labs showed no evidence of lytic lesions or multiple myeloma. It is thought that her original X-ray had some artifact that looked like a lytic lesion, but was not. There is no need for routine follow up in our clinic.  ----- Message ----- From: Interface, Rad Results In Sent: 05/10/2020   6:04 PM EDT To: Orson Slick, MD

## 2020-05-17 NOTE — Telephone Encounter (Signed)
TCT patient regarding recent labs and xray results.  Spoke with patient and advised that her labs and xrays did not show any evidence of lytic lesions or multiple myeloma. Advised that she does not need to follow up with [place] in this clinic.  Pt voiced understanding.

## 2020-05-30 ENCOUNTER — Ambulatory Visit
Admission: RE | Admit: 2020-05-30 | Discharge: 2020-05-30 | Disposition: A | Discharge status: Home / Self Care | Payer: 59 | Source: Ambulatory Visit | Attending: Nurse Practitioner | Admitting: Nurse Practitioner

## 2020-05-30 ENCOUNTER — Other Ambulatory Visit: Payer: Self-pay

## 2020-05-30 DIAGNOSIS — Z1231 Encounter for screening mammogram for malignant neoplasm of breast: Secondary | ICD-10-CM

## 2020-05-31 ENCOUNTER — Encounter: Payer: Self-pay | Admitting: Internal Medicine

## 2020-06-10 ENCOUNTER — Other Ambulatory Visit (HOSPITAL_COMMUNITY): Payer: Self-pay

## 2020-06-10 ENCOUNTER — Other Ambulatory Visit (HOSPITAL_BASED_OUTPATIENT_CLINIC_OR_DEPARTMENT_OTHER): Payer: Self-pay

## 2020-06-10 MED FILL — Topiramate Tab 50 MG: ORAL | 90 days supply | Qty: 90 | Fill #0 | Status: AC

## 2020-06-11 ENCOUNTER — Other Ambulatory Visit (HOSPITAL_COMMUNITY): Payer: Self-pay

## 2020-06-11 ENCOUNTER — Other Ambulatory Visit: Payer: Self-pay | Admitting: Internal Medicine

## 2020-06-11 DIAGNOSIS — K2100 Gastro-esophageal reflux disease with esophagitis, without bleeding: Secondary | ICD-10-CM

## 2020-06-11 MED ORDER — OMEPRAZOLE 20 MG PO CPDR
DELAYED_RELEASE_CAPSULE | Freq: Every day | ORAL | 1 refills | Status: DC
  Filled 2020-06-11: qty 180, fill #0
  Filled 2020-07-26: qty 180, 90d supply, fill #0
  Filled 2020-10-04 – 2020-10-07 (×2): qty 180, 90d supply, fill #1

## 2020-06-11 MED ORDER — ALPRAZOLAM 0.25 MG PO TABS
0.2500 mg | ORAL_TABLET | Freq: Three times a day (TID) | ORAL | 1 refills | Status: DC | PRN
  Filled 2020-06-11: qty 90, 30d supply, fill #0
  Filled 2020-07-26: qty 90, 30d supply, fill #1

## 2020-06-11 NOTE — Telephone Encounter (Signed)
Patient wants to know if for this refill she can get more than 60 pills.  As she is taking 1 in the morning,1 at night she also takes as needed for anxiety per Dr

## 2020-06-11 NOTE — Telephone Encounter (Signed)
Ok done tid prn -

## 2020-06-17 ENCOUNTER — Telehealth: Payer: Self-pay | Admitting: Internal Medicine

## 2020-06-17 ENCOUNTER — Telehealth (INDEPENDENT_AMBULATORY_CARE_PROVIDER_SITE_OTHER): Payer: 59 | Admitting: Family Medicine

## 2020-06-17 ENCOUNTER — Encounter: Payer: Self-pay | Admitting: Family Medicine

## 2020-06-17 DIAGNOSIS — U071 COVID-19: Secondary | ICD-10-CM

## 2020-06-17 MED ORDER — MOLNUPIRAVIR EUA 200MG CAPSULE: 4.0000 | ORAL_CAPSULE | Freq: Two times a day (BID) | ORAL | 0 refills | Status: AC

## 2020-06-17 NOTE — Progress Notes (Signed)
Virtual Visit via Telephone Note Pt unable to get her phone's audio and camera would not work.  Pt called on phone for visit. I connected with Mikey Kirschner on 06/17/20 at 11:00 AM EDT by telephone and verified that I am speaking with the correct person using two identifiers.   I discussed the limitations, risks, security and privacy concerns of performing an evaluation and management service by telephone and the availability of in person appointments. I also discussed with the patient that there may be a patient responsible charge related to this service. The patient expressed understanding and agreed to proceed.  Location patient: home Location provider: work or home office Participants present for the call: patient, provider Patient did not have a visit in the prior 7 days to address this/these issue(s).   History of Present Illness: Pt is a 60 year old female who is followed by Dr. Cathlean Cower and seen for acute concern.  Patient started getting sore throat on Friday 5/20.  Pt then developed coughing, sneezing, myalgias, HA, facial pain, ear pain, loss of taste and smell on Saturday. Thought she was getting a sinus infection, but symptoms felt different and she became more lethargic yesterday.  Pt had a positive rapid COVID test yesterday 5/22.  Pt was unable to sleep last night 2/2 the HA and achiness.  Tried tylenol and honey for symptoms.  Denies fever, vomiting, sick contacts.  Pt fully vaccinated and boosted x 1 with Pfizer COVID-19 vaccines.     Pt states everyone in her family has had COVID except her until now.  She wears her mask when out and washes her hands frequently.  Pt had gastric bypass in 2015 which causes her to have loose stools at baseline.    Observations/Objective: Patient sounds tired and sick but nontoxic on the phone. I do not appreciate any SOB. Speech and thought processing are grossly intact. Patient reported vitals:  Assessment and Plan: COVID-19 virus  infection  -positive at home COVID test 5/22 with symptoms starting 5/20 -Treatment of symptoms with supportive care including gargling with warm salt water or Chloraseptic spray, Tylenol, rest, hydration, OTC cough/cold medications -Patient also encouraged to use OTC saline nasal rinse or Flonase to help with facial pain -Discussed antiviral treatment options, r/b/a.  We will start [place]. -Given strict precautions - Plan: molnupiravir EUA 200 mg CAPS   Follow Up Instructions:  Prn with pcp  I did not refer this patient for an OV in the next 24 hours for this/these issue(s).  I discussed the assessment and treatment plan with the patient. The patient was provided an opportunity to ask questions and all were answered. The patient agreed with the plan and demonstrated an understanding of the instructions.   The patient was advised to call back or seek an in-person evaluation if the symptoms worsen or if the condition fails to improve as anticipated.  I provided 14 minutes of non-face-to-face time during this encounter.   Billie Ruddy, MD

## 2020-06-17 NOTE — Telephone Encounter (Signed)
I see pt already tx with molnupiravir  Let me know if she needs anything else

## 2020-06-17 NOTE — Telephone Encounter (Signed)
--  Caller states she tested positive for COVID today. She has a headache, sore throat, earache, congestion, and sinus drainage. Symptoms started a couple days ago and is getting worse. Vaccinated with booster  [facility]

## 2020-07-23 ENCOUNTER — Other Ambulatory Visit: Payer: Self-pay

## 2020-07-23 ENCOUNTER — Other Ambulatory Visit (HOSPITAL_COMMUNITY): Payer: Self-pay

## 2020-07-23 ENCOUNTER — Ambulatory Visit (INDEPENDENT_AMBULATORY_CARE_PROVIDER_SITE_OTHER): Payer: 59 | Admitting: Internal Medicine

## 2020-07-23 VITALS — BP 136/86 | HR 67 | Temp 99.2°F | Resp 18 | Ht 64.0 in | Wt 194.0 lb

## 2020-07-23 DIAGNOSIS — U071 COVID-19: Secondary | ICD-10-CM | POA: Diagnosis not present

## 2020-07-23 DIAGNOSIS — E7849 Other hyperlipidemia: Secondary | ICD-10-CM

## 2020-07-23 DIAGNOSIS — E039 Hypothyroidism, unspecified: Secondary | ICD-10-CM

## 2020-07-23 DIAGNOSIS — M797 Fibromyalgia: Secondary | ICD-10-CM

## 2020-07-23 MED ORDER — CELECOXIB 200 MG PO CAPS
200.0000 mg | ORAL_CAPSULE | Freq: Two times a day (BID) | ORAL | 3 refills | Status: DC | PRN
  Filled 2020-07-23: qty 180, 90d supply, fill #0
  Filled 2020-11-05: qty 180, 90d supply, fill #1
  Filled 2021-03-08: qty 180, 90d supply, fill #2
  Filled 2021-06-07: qty 131, 66d supply, fill #3
  Filled 2021-06-09: qty 49, 24d supply, fill #3

## 2020-07-23 MED ORDER — TIZANIDINE HCL 4 MG PO TABS
ORAL_TABLET | ORAL | 2 refills | Status: DC
  Filled 2020-07-23: qty 30, 10d supply, fill #0
  Filled 2020-12-13: qty 30, 10d supply, fill #1

## 2020-07-23 NOTE — Patient Instructions (Signed)
Please take all new medication as prescribed - the celebrex up to twice per day as needed  Please continue all other medications as before, and refills have been done if requested.  Please have the pharmacy call with any other refills you may need.  Please continue your efforts at being more active, low cholesterol diet, and weight control.  Please keep your appointments with your specialists as you may have planned  Please make an Appointment to return after Jan 1 , 2023, or sooner if needed

## 2020-07-23 NOTE — Progress Notes (Signed)
Patient ID: Kelli Bell, female   DOB: 1960-02-25, 60 y.o.   MRN: [id number]        Chief Complaint: follow up FMS pain       HPI:  Kelli Bell is a 60 y.o. female here for above, states Pt continues to have recurring LBP without change in severity, bowel or bladder change, fever, wt loss,  worsening LE pain/numbness/weakness, gait change or falls, but pain overall to upper and lower back uncontrolled despite tylenol, tramadol, and 2 mg tizanidine prn;  has been trying to follow lower chol diet and declines statin as is wary of worsening FMS pain.  Pt denies chest pain, increased sob or doe, wheezing, orthopnea, PND, increased LE swelling, palpitations, dizziness or syncope.   Pt denies polydipsia, polyuria, or new focal neuro s/s.  Pain overall 5/10, constant, worse at night, nothing else seems to make better or worse except maybe worse since husband diet.  Has been released from f/u with hematology. No other new complaints except did have recent covid infection 2 wks ago with congestion and aching all over, most of which now resolved.  Denies hyper or hypo thyroid symptoms such as voice, skin or hair change.       Wt Readings from Last 3 Encounters:  07/23/20 194 lb (88 kg)  04/29/20 190 lb (86.2 kg)  04/23/20 188 lb (85.3 kg)   BP Readings from Last 3 Encounters:  07/23/20 136/86  04/29/20 125/75  04/23/20 112/70         Past Medical History:  Diagnosis Date   ANXIETY 03/30/2007   BACK PAIN 02/20/2009   Essential hypertension, benign 05/31/2012   Family history of anesthesia complication    "mother has hard time waking up"   GERD 01/13/2007   H/O hiatal hernia    HYPERLIPIDEMIA 02/22/2008   HYPOTHYROIDISM 03/30/2007   MENOPAUSAL DISORDER 02/23/2008   Nocturia    OBSTRUCTIVE SLEEP APNEA 06/29/2007   has not used c pap x 8 months due to wt loss   Osteoarthritis    SINUSITIS- ACUTE-NOS 03/30/2007   Sleep apnea    THORACIC/LUMBOSACRAL NEURITIS/RADICULITIS UNSPEC 02/23/2008   Wheezing 01/08/2009    Past Surgical History:  Procedure Laterality Date   ABDOMINAL HYSTERECTOMY  2004   with BSO   BREATH TEK H PYLORI N/A 05/05/2013   Procedure: [place];  Surgeon: Shann Medal, MD;  Location: [facility];  Service: General;  Laterality: N/A;   CHOLECYSTECTOMY  1980   GASTRIC ROUX-EN-Y N/A 09/12/2013   Procedure: LAPAROSCOPIC ROUX-EN-Y GASTRIC BYPASS WITH UPPER ENDOSCOPY WITH ENTEROLYSIS OF ADHESIONS;  Surgeon: Shann Medal, MD;  Location: [facility];  Service: General;  Laterality: N/A;   jaw bone surgury     60 yo   SHOULDER ARTHROSCOPY WITH ROTATOR CUFF REPAIR AND SUBACROMIAL DECOMPRESSION Right 01/02/2020   Procedure: SHOULDER ARTHROSCOPY WITH ROTATOR CUFF REPAIR , SUBACROMIAL DECOMPRESSION,DISTAL CLAVICLE EXICISION;  Surgeon: Tania Ade, MD;  Location: [place];  Service: Orthopedics;  Laterality: Right;   TONSILLECTOMY      reports that she quit smoking about 23 years ago. She has never used smokeless tobacco. She reports current alcohol use. She reports that she does not use drugs. family history includes Arthritis in an other family member; Asthma in her brother; Breast cancer in her maternal aunt; Cancer in her mother; Colon cancer in an other family member; Colon polyps (age of onset: 60) in her father; Diabetes in her mother and paternal grandmother; Heart disease  in her paternal grandmother; Hypertension in her mother; Lymphoma in her maternal grandmother; Osteoporosis in an other family member; Stroke in her mother; Thyroid disease in her maternal grandmother and mother. Allergies  Allergen Reactions   Codeine Nausea Only   Current Outpatient Medications on File Prior to Visit  Medication Sig Dispense Refill   allopurinol (ZYLOPRIM) 100 MG tablet TAKE 2 TABLETS BY MOUTH ONCE A DAY 180 tablet 0   ALPRAZolam (XANAX) 0.25 MG tablet Take 1 tablet (0.25 mg total) by mouth 3 (three) times daily as needed for anxiety. 90 tablet 1   CALCIUM PO Take 5  mLs by mouth daily.     carbamazepine (TEGRETOL XR) 100 MG 12 hr tablet TAKE 2 TABLETS BY MOUTH 2 TIMES DAILY 120 tablet 3   colchicine 0.6 MG tablet      Cyanocobalamin (B-12 PO) Take 5 mLs by mouth as needed (low b12).     DULoxetine (CYMBALTA) 60 MG capsule TAKE 2 CAPSULES BY MOUTH DAILY 180 capsule 3   gabapentin (NEURONTIN) 100 MG capsule TAKE 2 CAPSULES BY MOUTH AT BEDTIME 180 capsule 0   levothyroxine (SYNTHROID) 88 MCG tablet TAKE 1 TABLET BY MOUTH EVERY MORNING 90 tablet 3   Multiple Vitamins-Minerals (MULTIVITAMIN ADULT PO) Take 1 tablet by mouth daily.     nystatin-triamcinolone (MYCOLOG II) cream Apply 1 application topically as needed. 30 g 1   omeprazole (PRILOSEC) 20 MG capsule TAKE 2 CAPSULES BY MOUTH DAILY. 180 capsule 1   ondansetron (ZOFRAN) 4 MG tablet TAKE 1 TABLET BY MOUTH ONCE A DAY AS NEEDED FOR NAUSEA OR VOMITING 30 tablet 1   topiramate (TOPAMAX) 50 MG tablet TAKE 1 TABLET BY MOUTH ONCE A DAY 90 tablet 3   traMADol (ULTRAM) 50 MG tablet Take 1 tablet (50 mg total) by mouth every 6 (six) hours as needed. 60 tablet 3   No current facility-administered medications on file prior to visit.        ROS:  All others reviewed and negative.  Objective        PE:  BP 136/86   Pulse 67   Temp 99.2 F (37.3 C) (Oral)   Resp 18   Ht 5\' 4"  (1.626 m)   Wt 194 lb (88 kg)   SpO2 99%   BMI 33.30 kg/m                 Constitutional: Pt appears in NAD               HENT: Head: NCAT.                Right Ear: External ear normal.                 Left Ear: External ear normal.                Eyes: . Pupils are equal, round, and reactive to light. Conjunctivae and EOM are normal               Nose: without d/c or deformity               Neck: Neck supple. Gross normal ROM               Cardiovascular: Normal rate and regular rhythm.                 Pulmonary/Chest: Effort normal and breath sounds without rales or wheezing.  Abd:  Soft, NT, ND, + BS, no  organomegaly               Neurological: Pt is alert. At baseline orientation, motor grossly intact               Skin: Skin is warm. No rashes, no other new lesions, LE edema - trace bilat               Psychiatric: Pt behavior is normal without agitation   Micro: none  Cardiac tracings I have personally interpreted today:  none  Pertinent Radiological findings (summarize): none   Lab Results  Component Value Date   WBC 4.2 04/29/2020   HGB 11.1 (L) 04/29/2020   HCT 34.6 (L) 04/29/2020   PLT 378 04/29/2020   GLUCOSE 81 04/29/2020   CHOL 243 (H) 04/23/2020   TRIG 112.0 04/23/2020   HDL 101.60 04/23/2020   LDLDIRECT 94.8 04/29/2012   LDLCALC 119 (H) 04/23/2020   ALT 13 04/29/2020   AST 18 04/29/2020   NA 141 04/29/2020   K 4.3 04/29/2020   CL 109 04/29/2020   CREATININE 0.62 04/29/2020   BUN 17 04/29/2020   CO2 22 04/29/2020   TSH 0.69 04/23/2020   INR 0.9 04/23/2020   HGBA1C 5.9 04/23/2020   Assessment/Plan:  Emeree Mahler is a 60 y.o. White or Caucasian [1] female with  has a past medical history of ANXIETY (03/30/2007), BACK PAIN (02/20/2009), Essential hypertension, benign (05/31/2012), Family history of anesthesia complication, GERD (17/91/5056), H/O hiatal hernia, HYPERLIPIDEMIA (02/22/2008), HYPOTHYROIDISM (03/30/2007), MENOPAUSAL DISORDER (02/23/2008), Nocturia, OBSTRUCTIVE SLEEP APNEA (06/29/2007), Osteoarthritis, SINUSITIS- ACUTE-NOS (03/30/2007), Sleep apnea, THORACIC/LUMBOSACRAL NEURITIS/RADICULITIS UNSPEC (02/23/2008), and Wheezing (01/08/2009).  Fibromyalgia With uncontrolled pain, to consider current tx and add celebrex 200 bid prn,  to f/u any worsening symptoms or concerns  Hyperlipidemia Lab Results  Component Value Date   LDLCALC 119 (H) 04/23/2020   Uncontrolled, pt to continue current low chol diet, as declines statin   COVID-19 virus infection Resolved, no apprent long haul symptoms,  to f/u any worsening symptoms or concerns  Hypothyroidism Lab Results   Component Value Date   TSH 0.69 04/23/2020   Stable, pt to continue levothyroxine 88  Followup: Return in about 6 months (around 01/27/2021).  Cathlean Cower, MD 07/25/2020 9:56 PM [facility]

## 2020-07-24 ENCOUNTER — Encounter: Payer: BC Managed Care – PPO | Admitting: Nurse Practitioner

## 2020-07-25 ENCOUNTER — Encounter: Payer: Self-pay | Admitting: Internal Medicine

## 2020-07-25 DIAGNOSIS — U071 COVID-19: Secondary | ICD-10-CM | POA: Insufficient documentation

## 2020-07-25 DIAGNOSIS — M797 Fibromyalgia: Secondary | ICD-10-CM | POA: Insufficient documentation

## 2020-07-25 NOTE — Assessment & Plan Note (Signed)
Resolved, no apprent long haul symptoms,  to f/u any worsening symptoms or concerns

## 2020-07-25 NOTE — Assessment & Plan Note (Signed)
With uncontrolled pain, to consider current tx and add celebrex 200 bid prn,  to f/u any worsening symptoms or concerns

## 2020-07-25 NOTE — Assessment & Plan Note (Signed)
Lab Results  Component Value Date   TSH 0.69 04/23/2020   Stable, pt to continue levothyroxine 88

## 2020-07-25 NOTE — Assessment & Plan Note (Signed)
Lab Results  Component Value Date   LDLCALC 119 (H) 04/23/2020   Uncontrolled, pt to continue current low chol diet, as declines statin

## 2020-07-26 ENCOUNTER — Other Ambulatory Visit (HOSPITAL_COMMUNITY): Payer: Self-pay

## 2020-07-26 ENCOUNTER — Other Ambulatory Visit: Payer: Self-pay | Admitting: Family Medicine

## 2020-07-26 MED ORDER — GABAPENTIN 100 MG PO CAPS
ORAL_CAPSULE | Freq: Every day | ORAL | 0 refills | Status: DC
  Filled 2020-07-26: qty 90, 90d supply, fill #0

## 2020-07-26 MED ORDER — ALLOPURINOL 100 MG PO TABS
ORAL_TABLET | Freq: Every day | ORAL | 0 refills | Status: DC
  Filled 2020-07-26: qty 90, 90d supply, fill #0

## 2020-07-26 MED FILL — Levothyroxine Sodium Tab 88 MCG: ORAL | 90 days supply | Qty: 90 | Fill #1 | Status: AC

## 2020-07-26 MED FILL — Duloxetine HCl Enteric Coated Pellets Cap 60 MG (Base Eq): ORAL | 90 days supply | Qty: 180 | Fill #0 | Status: AC

## 2020-07-31 ENCOUNTER — Ambulatory Visit: Payer: 59 | Admitting: Nurse Practitioner

## 2020-08-21 ENCOUNTER — Telehealth: Payer: Self-pay

## 2020-08-21 ENCOUNTER — Other Ambulatory Visit (HOSPITAL_COMMUNITY): Payer: Self-pay

## 2020-08-21 NOTE — Telephone Encounter (Signed)
pt has stated her Gabapentin was sent in for 1x a day instead of 2x a day a/w her Allopurinol should be 2x a day and was sent in as 1x a day. Pt is requesting that Dr. Jenny Reichmann or his nurse call her as she was not aware of the change to 1x a day and no longer 2x a day.  *She would like for the medications to stay at 2x a day.

## 2020-08-22 ENCOUNTER — Other Ambulatory Visit (HOSPITAL_COMMUNITY): Payer: Self-pay

## 2020-08-22 ENCOUNTER — Telehealth: Payer: Self-pay | Admitting: Family Medicine

## 2020-08-22 ENCOUNTER — Other Ambulatory Visit: Payer: Self-pay

## 2020-08-22 MED ORDER — GABAPENTIN 100 MG PO CAPS
200.0000 mg | ORAL_CAPSULE | Freq: Every day | ORAL | 0 refills | Status: DC
  Filled 2020-08-22 – 2020-09-03 (×3): qty 180, 90d supply, fill #0

## 2020-08-22 MED ORDER — ALLOPURINOL 100 MG PO TABS
100.0000 mg | ORAL_TABLET | Freq: Two times a day (BID) | ORAL | 0 refills | Status: DC
  Filled 2020-08-22 – 2020-09-03 (×3): qty 180, 90d supply, fill #0

## 2020-08-22 NOTE — Telephone Encounter (Signed)
Called Kelli Bell and she states that she apologizes for the confusion. She will contact Dr. Tamala Julian regarding medication changes.

## 2020-08-22 NOTE — Telephone Encounter (Signed)
Patient doing fu call from yesterday. Says prescription for Gabapentin & Allopurinol was changed to 1x a day & she was not aware that Dr. Jenny Reichmann was going to change it becaue they didn't discuss any med changes at prev appt.   She would like to continue taking it as she has before which is 2x daily  Would like a call back [phone]

## 2020-08-22 NOTE — Telephone Encounter (Signed)
Sorry I didn't write either of those rx  Please ask pt to ask Dr Tamala Julian

## 2020-08-22 NOTE — Telephone Encounter (Signed)
Spoke with patient and pharmacy. Per a verbal from Dr. Tamala Julian, patient is to be on '200mg'$  of each medication. New rx sent to [facility].

## 2020-08-22 NOTE — Telephone Encounter (Signed)
Patient very confused, states that her dosage of allopurinol and gabapentin has changed.  She used to take 100 mgs of both meds twice daily. She picked up her recent refills and the frequency was changed to 1x daily for each med so she only has half of the meds she expected.  Pt concerned as she was doing so well and no change was discussed with her.

## 2020-08-23 ENCOUNTER — Other Ambulatory Visit (HOSPITAL_COMMUNITY): Payer: Self-pay

## 2020-08-26 ENCOUNTER — Other Ambulatory Visit (HOSPITAL_COMMUNITY): Payer: Self-pay

## 2020-09-01 ENCOUNTER — Other Ambulatory Visit: Payer: Self-pay | Admitting: Internal Medicine

## 2020-09-03 ENCOUNTER — Other Ambulatory Visit (HOSPITAL_COMMUNITY): Payer: Self-pay

## 2020-09-25 ENCOUNTER — Ambulatory Visit: Payer: 59 | Admitting: Nurse Practitioner

## 2020-09-25 DIAGNOSIS — Z0289 Encounter for other administrative examinations: Secondary | ICD-10-CM

## 2020-09-26 ENCOUNTER — Other Ambulatory Visit: Payer: Self-pay | Admitting: Internal Medicine

## 2020-09-26 MED ORDER — ALPRAZOLAM 0.25 MG PO TABS
0.2500 mg | ORAL_TABLET | Freq: Three times a day (TID) | ORAL | 1 refills | Status: DC | PRN
  Filled 2020-09-26: qty 90, 30d supply, fill #0
  Filled 2020-12-12: qty 90, 30d supply, fill #1

## 2020-09-26 MED FILL — Topiramate Tab 50 MG: ORAL | 90 days supply | Qty: 90 | Fill #1 | Status: AC

## 2020-09-27 ENCOUNTER — Other Ambulatory Visit (HOSPITAL_COMMUNITY): Payer: Self-pay

## 2020-10-04 ENCOUNTER — Other Ambulatory Visit (HOSPITAL_COMMUNITY): Payer: Self-pay

## 2020-10-04 MED ORDER — FERROUS SULFATE 325 (65 FE) MG PO TBEC
1.0000 | DELAYED_RELEASE_TABLET | Freq: Every day | ORAL | 11 refills | Status: DC
  Filled 2020-10-04: qty 100, 100d supply, fill #0

## 2020-10-05 ENCOUNTER — Other Ambulatory Visit (HOSPITAL_COMMUNITY): Payer: Self-pay

## 2020-10-07 ENCOUNTER — Other Ambulatory Visit (HOSPITAL_COMMUNITY): Payer: Self-pay

## 2020-11-05 ENCOUNTER — Other Ambulatory Visit (HOSPITAL_COMMUNITY): Payer: Self-pay

## 2020-11-26 ENCOUNTER — Other Ambulatory Visit: Payer: Self-pay | Admitting: Internal Medicine

## 2020-12-09 ENCOUNTER — Ambulatory Visit: Payer: 59 | Admitting: Nurse Practitioner

## 2020-12-09 NOTE — Progress Notes (Deleted)
   Kelli Bell 05-14-1960 [id number]   History:  60 y.o. G2 P2 presents for annual exam.  Postmenopausal - no HRT. S/P 2004 TAH BSO for endometriosis.  Normal Pap and mammogram history.  Primary care manages HTN, HLD, hypothyroidism and bone densities, fibromyalgia managed by rheumatology. 2015 gastric sleeve. Doing well on Lexapro for anxiety/depression since husbands passing in 2017.   Gynecologic History No LMP recorded. Patient has had a hysterectomy.   Contraception: status post hysterectomy  Health Maintenance Last Pap: 2012. Results were: Normal Last mammogram: 05/30/2020. Results were: Normal Last colonoscopy: 04/11/2020. Results were: Benign polyps Last Dexa: 05/09/12. Results were: normal  Past medical history, past surgical history, family history and social history were all reviewed and documented in the EPIC chart. Widowed. 7 grandchildren.   ROS:  A ROS was performed and pertinent positives and negatives are included.  Exam:  There were no vitals filed for this visit.  There is no height or weight on file to calculate BMI.  General appearance:  Normal Thyroid:  Symmetrical, normal in size, without palpable masses or nodularity. Respiratory  Auscultation:  Clear without wheezing or rhonchi Cardiovascular  Auscultation:  Regular rate, without rubs, murmurs or gallops  Edema/varicosities:  Not grossly evident Abdominal  Soft,nontender, without masses, guarding or rebound.  Liver/spleen:  No organomegaly noted  Hernia:  None appreciated  Skin  Inspection:  Grossly normal   Breasts: Examined lying and sitting.   Right: Without masses, retractions, discharge or axillary adenopathy.   Left: Without masses, retractions, discharge or axillary adenopathy. Gentitourinary   Inguinal/mons:  Normal without inguinal adenopathy  External genitalia:  Normal  BUS/Urethra/Skene's glands:  Normal  Vagina:  Urethral prolapse, atrophic changes  Cervix:  Absent  Uterus:   Absent  Adnexa/parametria:     Rt: Without masses or tenderness.   Lt: Without masses or tenderness.  Anus and perineum: Normal, non-bleeding hemorrhoids  Digital rectal exam: Normal sphincter tone without palpated masses or tenderness  Assessment/Plan:  60 y.o. G2 P2 for annual exam.   Well female exam with routine gynecological exam - Education provided on SBEs, importance of preventative screenings, current guidelines, high calcium diet, regular exercise, and multivitamin daily. Recommended focusing more on diet since she is not able to be as active due to pain and fatigue related fibromyalgia. Her medications have been adjusted recently for this.   Night sweats - most likely due to recent increase/changes in medications. She asked about using Lume natural deodorant and of course this is fine to use. Also recommend probiotic for body odor.   Follow up in 1 year for annual      [place], 11:36 AM 12/09/2020

## 2020-12-12 ENCOUNTER — Other Ambulatory Visit: Payer: Self-pay | Admitting: Surgery

## 2020-12-12 ENCOUNTER — Other Ambulatory Visit: Payer: Self-pay | Admitting: Internal Medicine

## 2020-12-12 ENCOUNTER — Other Ambulatory Visit: Payer: Self-pay | Admitting: Family Medicine

## 2020-12-12 ENCOUNTER — Ambulatory Visit: Payer: Self-pay | Admitting: Surgery

## 2020-12-12 DIAGNOSIS — K2100 Gastro-esophageal reflux disease with esophagitis, without bleeding: Secondary | ICD-10-CM

## 2020-12-12 DIAGNOSIS — L658 Other specified nonscarring hair loss: Secondary | ICD-10-CM | POA: Insufficient documentation

## 2020-12-12 DIAGNOSIS — L821 Other seborrheic keratosis: Secondary | ICD-10-CM | POA: Insufficient documentation

## 2020-12-12 MED ORDER — SODIUM CHLORIDE 0.9 % IV SOLN: 1000.0000 mg | Freq: Once | INTRAVENOUS | Status: AC

## 2020-12-12 MED ORDER — SODIUM CHLORIDE 0.9 % IV SOLN: 25.0000 mg | Freq: Once | INTRAVENOUS | Status: AC

## 2020-12-12 MED FILL — Topiramate Tab 50 MG: ORAL | 90 days supply | Qty: 90 | Fill #2 | Status: AC

## 2020-12-12 MED FILL — Levothyroxine Sodium Tab 88 MCG: ORAL | 90 days supply | Qty: 90 | Fill #2 | Status: AC

## 2020-12-12 MED FILL — Duloxetine HCl Enteric Coated Pellets Cap 60 MG (Base Eq): ORAL | 90 days supply | Qty: 180 | Fill #1 | Status: AC

## 2020-12-13 ENCOUNTER — Other Ambulatory Visit: Payer: Self-pay | Admitting: Internal Medicine

## 2020-12-13 ENCOUNTER — Other Ambulatory Visit: Payer: Self-pay

## 2020-12-13 ENCOUNTER — Other Ambulatory Visit (HOSPITAL_COMMUNITY): Payer: Self-pay

## 2020-12-13 MED ORDER — OMEPRAZOLE 20 MG PO CPDR
DELAYED_RELEASE_CAPSULE | Freq: Every day | ORAL | 1 refills | Status: DC
  Filled 2020-12-13: qty 180, 90d supply, fill #0
  Filled 2021-03-08: qty 180, 90d supply, fill #1

## 2020-12-13 MED ORDER — GABAPENTIN 100 MG PO CAPS
200.0000 mg | ORAL_CAPSULE | Freq: Every day | ORAL | 1 refills | Status: DC
  Filled 2020-12-13: qty 180, 90d supply, fill #0
  Filled 2021-03-08: qty 180, 90d supply, fill #1

## 2020-12-13 MED ORDER — ALLOPURINOL 100 MG PO TABS
100.0000 mg | ORAL_TABLET | Freq: Two times a day (BID) | ORAL | 0 refills | Status: DC
  Filled 2020-12-13: qty 180, 90d supply, fill #0

## 2020-12-13 NOTE — Telephone Encounter (Signed)
1.Medication Requested: allopurinol (ZYLOPRIM) 100 MG tablet gabapentin (NEURONTIN) 100 MG capsule   2. Pharmacy (Name, Street, [place]): [facility]  Phone:  [phone] Fax:  [phone]   3. On Med List: yes  4. Last Visit with PCP: 06.28.22  5. Next visit date with PCP: n/a   Agent: Please be advised that RX refills may take up to 3 business days. We ask that you follow-up with your pharmacy.

## 2020-12-13 NOTE — Telephone Encounter (Signed)
Please refill as per office routine med refill policy (all routine meds to be refilled for 3 mo or monthly (per pt preference) up to one year from last visit, then month to month grace period for 3 mo, then further med refills will have to be denied) ? ?

## 2020-12-17 ENCOUNTER — Other Ambulatory Visit (HOSPITAL_COMMUNITY): Payer: Self-pay | Admitting: *Deleted

## 2020-12-17 ENCOUNTER — Other Ambulatory Visit (HOSPITAL_COMMUNITY): Payer: Self-pay

## 2020-12-17 ENCOUNTER — Ambulatory Visit: Payer: Self-pay | Admitting: Surgery

## 2020-12-17 MED ORDER — FERUMOXYTOL INJECTION 510 MG/17 ML
510.0000 mg | INTRAVENOUS | 0 refills | Status: DC
  Filled 2020-12-17: qty 34, 14d supply, fill #0

## 2020-12-18 ENCOUNTER — Other Ambulatory Visit: Payer: Self-pay | Admitting: Surgery

## 2020-12-18 ENCOUNTER — Other Ambulatory Visit (HOSPITAL_COMMUNITY): Payer: Self-pay

## 2020-12-24 ENCOUNTER — Encounter (HOSPITAL_COMMUNITY)
Admission: RE | Admit: 2020-12-24 | Discharge: 2020-12-24 | Disposition: A | Discharge status: Home / Self Care | Payer: 59 | Source: Ambulatory Visit | Attending: Surgery | Admitting: Surgery

## 2020-12-24 DIAGNOSIS — Z9884 Bariatric surgery status: Secondary | ICD-10-CM | POA: Insufficient documentation

## 2020-12-24 DIAGNOSIS — D508 Other iron deficiency anemias: Secondary | ICD-10-CM | POA: Diagnosis not present

## 2020-12-24 MED ORDER — SODIUM CHLORIDE 0.9 % IV SOLN
510.0000 mg | INTRAVENOUS | Status: DC
  Administered 2020-12-24: 510 mg via INTRAVENOUS
  Filled 2020-12-24: qty 510

## 2020-12-25 ENCOUNTER — Other Ambulatory Visit (HOSPITAL_COMMUNITY): Payer: Self-pay

## 2020-12-31 ENCOUNTER — Other Ambulatory Visit: Payer: Self-pay

## 2020-12-31 ENCOUNTER — Encounter (HOSPITAL_COMMUNITY)
Admission: RE | Admit: 2020-12-31 | Discharge: 2020-12-31 | Disposition: A | Discharge status: Home / Self Care | Payer: 59 | Source: Ambulatory Visit | Attending: Surgery | Admitting: Surgery

## 2020-12-31 DIAGNOSIS — D508 Other iron deficiency anemias: Secondary | ICD-10-CM | POA: Insufficient documentation

## 2020-12-31 DIAGNOSIS — Z9884 Bariatric surgery status: Secondary | ICD-10-CM | POA: Insufficient documentation

## 2020-12-31 MED ORDER — SODIUM CHLORIDE 0.9 % IV SOLN
510.0000 mg | INTRAVENOUS | Status: DC
  Filled 2020-12-31: qty 17

## 2020-12-31 NOTE — Progress Notes (Signed)
Iron deficiency anemia with inability to absorb iron well due to gastric bypass anatomy

## 2020-12-31 NOTE — Progress Notes (Signed)
Kelli Bell is here today for her second dose of iv feraheme.  She has been weak and tired howeverm She stated after she left our clinic last week she was very dizzy, she walked into our clinic but upon walking out she said they had to get her a wheelchair and take her to her car because she felt like she was going to pass out,  had to be helped out of her chair at home to go to the bathroom, and slept for hours.  She did not call the Dr or clinic to let them know about her symptoms post infusion until she came in today for her second dose.  I called and made Dr Thermon Leyland aware of the above.  Orders received to cancel the dose of feraheme today.  I let Kelli Cloward know she should follow up with her Dr regarding next steps and she stated she has another appt with him later this month.

## 2021-01-07 ENCOUNTER — Ambulatory Visit (INDEPENDENT_AMBULATORY_CARE_PROVIDER_SITE_OTHER): Payer: 59 | Admitting: Internal Medicine

## 2021-01-07 ENCOUNTER — Other Ambulatory Visit: Payer: Self-pay

## 2021-01-07 ENCOUNTER — Encounter: Payer: Self-pay | Admitting: Internal Medicine

## 2021-01-07 VITALS — BP 118/72 | HR 64 | Temp 97.6°F | Wt 196.4 lb

## 2021-01-07 DIAGNOSIS — E611 Iron deficiency: Secondary | ICD-10-CM | POA: Diagnosis not present

## 2021-01-07 DIAGNOSIS — E538 Deficiency of other specified B group vitamins: Secondary | ICD-10-CM | POA: Diagnosis not present

## 2021-01-07 DIAGNOSIS — F32A Depression, unspecified: Secondary | ICD-10-CM

## 2021-01-07 DIAGNOSIS — E559 Vitamin D deficiency, unspecified: Secondary | ICD-10-CM

## 2021-01-07 DIAGNOSIS — R5383 Other fatigue: Secondary | ICD-10-CM | POA: Insufficient documentation

## 2021-01-07 DIAGNOSIS — R739 Hyperglycemia, unspecified: Secondary | ICD-10-CM

## 2021-01-07 DIAGNOSIS — E7800 Pure hypercholesterolemia, unspecified: Secondary | ICD-10-CM

## 2021-01-07 DIAGNOSIS — E039 Hypothyroidism, unspecified: Secondary | ICD-10-CM

## 2021-01-07 LAB — CBC WITH DIFFERENTIAL/PLATELET
Basophils Absolute: 0.1 10*3/uL (ref 0.0–0.1)
Basophils Relative: 1.2 % (ref 0.0–3.0)
Eosinophils Absolute: 0.1 10*3/uL (ref 0.0–0.7)
Eosinophils Relative: 2.8 % (ref 0.0–5.0)
HCT: 35.4 % — ABNORMAL LOW (ref 36.0–46.0)
Hemoglobin: 11.6 g/dL — ABNORMAL LOW (ref 12.0–15.0)
Lymphocytes Relative: 31.5 % (ref 12.0–46.0)
Lymphs Abs: 1.4 10*3/uL (ref 0.7–4.0)
MCHC: 32.7 g/dL (ref 30.0–36.0)
MCV: 89.2 fl (ref 78.0–100.0)
Monocytes Absolute: 0.6 10*3/uL (ref 0.1–1.0)
Monocytes Relative: 13.6 % — ABNORMAL HIGH (ref 3.0–12.0)
Neutro Abs: 2.2 10*3/uL (ref 1.4–7.7)
Neutrophils Relative %: 50.9 % (ref 43.0–77.0)
Platelets: 333 10*3/uL (ref 150.0–400.0)
RBC: 3.97 Mil/uL (ref 3.87–5.11)
RDW: 19.1 % — ABNORMAL HIGH (ref 11.5–15.5)
WBC: 4.4 10*3/uL (ref 4.0–10.5)

## 2021-01-07 LAB — IBC PANEL
Iron: 93 ug/dL (ref 42–145)
Saturation Ratios: 29.4 % (ref 20.0–50.0)
TIBC: 316.4 ug/dL (ref 250.0–450.0)
Transferrin: 226 mg/dL (ref 212.0–360.0)

## 2021-01-07 LAB — VITAMIN B12: Vitamin B-12: 412 pg/mL (ref 211–911)

## 2021-01-07 LAB — FERRITIN: Ferritin: 201.9 ng/mL (ref 10.0–291.0)

## 2021-01-07 LAB — BASIC METABOLIC PANEL
BUN: 14 mg/dL (ref 6–23)
CO2: 28 mEq/L (ref 19–32)
Calcium: 9 mg/dL (ref 8.4–10.5)
Chloride: 105 mEq/L (ref 96–112)
Creatinine, Ser: 0.62 mg/dL (ref 0.40–1.20)
GFR: 96.33 mL/min (ref >60.00)
Glucose, Bld: 83 mg/dL (ref 70–99)
Potassium: 4.3 mEq/L (ref 3.5–5.1)
Sodium: 139 mEq/L (ref 135–145)

## 2021-01-07 LAB — HEMOGLOBIN A1C: Hgb A1c MFr Bld: 5.8 % (ref 4.6–6.5)

## 2021-01-07 LAB — LIPID PANEL
Cholesterol: 221 mg/dL — ABNORMAL HIGH (ref 0–200)
HDL: 91.4 mg/dL (ref >39.00)
LDL Cholesterol: 103 mg/dL — ABNORMAL HIGH (ref 0–99)
NonHDL: 129.25
Total CHOL/HDL Ratio: 2
Triglycerides: 132 mg/dL (ref 0.0–149.0)
VLDL: 26.4 mg/dL (ref 0.0–40.0)

## 2021-01-07 LAB — HEPATIC FUNCTION PANEL
ALT: 10 U/L (ref 0–35)
AST: 15 U/L (ref 0–37)
Albumin: 4 g/dL (ref 3.5–5.2)
Alkaline Phosphatase: 99 U/L (ref 39–117)
Bilirubin, Direct: 0 mg/dL (ref 0.0–0.3)
Total Bilirubin: 0.2 mg/dL (ref 0.2–1.2)
Total Protein: 6.3 g/dL (ref 6.0–8.3)

## 2021-01-07 LAB — VITAMIN D 25 HYDROXY (VIT D DEFICIENCY, FRACTURES): VITD: 35.75 ng/mL (ref 30.00–100.00)

## 2021-01-07 LAB — TSH: TSH: 0.8 u[IU]/mL (ref 0.35–5.50)

## 2021-01-07 NOTE — Progress Notes (Signed)
Patient ID: Kelli Bell, female   DOB: 07-04-1960, 60 y.o.   MRN: [id number]        Chief Complaint: follow up HTN, HLD and hyperglycemia, low iron, depression       HPI:  Kelli Bell is a 60 y.o. female here after recent labs per [facility] with low iron, now for iron infusions.  Had to walk in long hallways with weakness and dizzy after iron infusion, stated might have been a side effect by the admin nurse, but pt not so sure.  2nd infusion sched for 1 wk later being held, but some confusion apparently over hwo advised this as the MD was in [place].  Now the ordering MD is out of town per pt, and pt c/o marked generalized weakness.  Pt asking for iron and cbc recheck, pt also asking for TFTs.  Denies hyper or hypo thyroid symptoms such as voice, skin or hair change.  Has had mild worsening 1-2 mo depressive symptoms but no SI or HI.  Does c/o ongoing fatigue, but denies signficant daytime hypersomnolence.       Wt Readings from Last 3 Encounters:  01/07/21 196 lb 6.4 oz (89.1 kg)  12/24/20 191 lb (86.6 kg)  07/23/20 194 lb (88 kg)   BP Readings from Last 3 Encounters:  01/07/21 118/72  12/24/20 116/71  07/23/20 136/86         Past Medical History:  Diagnosis Date   ANXIETY 03/30/2007   BACK PAIN 02/20/2009   Essential hypertension, benign 05/31/2012   Family history of anesthesia complication    "mother has hard time waking up"   GERD 01/13/2007   H/O hiatal hernia    HYPERLIPIDEMIA 02/22/2008   HYPOTHYROIDISM 03/30/2007   MENOPAUSAL DISORDER 02/23/2008   Nocturia    OBSTRUCTIVE SLEEP APNEA 06/29/2007   has not used c pap x 8 months due to wt loss   Osteoarthritis    SINUSITIS- ACUTE-NOS 03/30/2007   Sleep apnea    THORACIC/LUMBOSACRAL NEURITIS/RADICULITIS UNSPEC 02/23/2008   Wheezing 01/08/2009   Past Surgical History:  Procedure Laterality Date   ABDOMINAL HYSTERECTOMY  2004   with BSO   BREATH TEK H PYLORI N/A 05/05/2013   Procedure: [place];  Surgeon: Shann Medal, MD;   Location: [facility];  Service: General;  Laterality: N/A;   CHOLECYSTECTOMY  1980   GASTRIC ROUX-EN-Y N/A 09/12/2013   Procedure: LAPAROSCOPIC ROUX-EN-Y GASTRIC BYPASS WITH UPPER ENDOSCOPY WITH ENTEROLYSIS OF ADHESIONS;  Surgeon: Shann Medal, MD;  Location: [facility];  Service: General;  Laterality: N/A;   jaw bone surgury     60 yo   SHOULDER ARTHROSCOPY WITH ROTATOR CUFF REPAIR AND SUBACROMIAL DECOMPRESSION Right 01/02/2020   Procedure: SHOULDER ARTHROSCOPY WITH ROTATOR CUFF REPAIR , SUBACROMIAL DECOMPRESSION,DISTAL CLAVICLE EXICISION;  Surgeon: Tania Ade, MD;  Location: [place];  Service: Orthopedics;  Laterality: Right;   TONSILLECTOMY      reports that she quit smoking about 23 years ago. She has never used smokeless tobacco. She reports current alcohol use. She reports that she does not use drugs. family history includes Arthritis in an other family member; Asthma in her brother; Breast cancer in her maternal aunt; Cancer in her mother; Colon cancer in an other family member; Colon polyps (age of onset: 64) in her father; Diabetes in her mother and paternal grandmother; Heart disease in her paternal grandmother; Hypertension in her mother; Lymphoma in her maternal grandmother; Osteoporosis in an other family member; Stroke in  her mother; Thyroid disease in her maternal grandmother and mother. Allergies  Allergen Reactions   Codeine Nausea Only   Feraheme [Ferumoxytol] Other (See Comments)    Dizzy, almost passed out, very tired and slept for hours after   Current Outpatient Medications on File Prior to Visit  Medication Sig Dispense Refill   allopurinol (ZYLOPRIM) 100 MG tablet Take 1 tablet (100 mg total) by mouth 2 (two) times daily. 180 tablet 0   ALPRAZolam (XANAX) 0.25 MG tablet Take 1 tablet (0.25 mg total) by mouth 3 (three) times daily as needed for anxiety. 90 tablet 1   CALCIUM PO Take 5 mLs by mouth daily.     carbamazepine (TEGRETOL XR) 100 MG 12 hr  tablet TAKE 2 TABLETS BY MOUTH TWICE A DAY 120 tablet 3   celecoxib (CELEBREX) 200 MG capsule Take 1 capsule (200 mg total) by mouth 2 (two) times daily as needed. 180 capsule 3   colchicine 0.6 MG tablet      Cyanocobalamin (B-12 PO) Take 5 mLs by mouth as needed (low b12).     DULoxetine (CYMBALTA) 60 MG capsule TAKE 2 CAPSULES BY MOUTH DAILY 180 capsule 3   gabapentin (NEURONTIN) 100 MG capsule Take 2 capsules (200 mg total) by mouth at bedtime. 180 capsule 1   levothyroxine (SYNTHROID) 88 MCG tablet TAKE 1 TABLET BY MOUTH EVERY MORNING 90 tablet 3   Multiple Vitamins-Minerals (MULTIVITAMIN ADULT PO) Take 1 tablet by mouth daily.     nystatin-triamcinolone (MYCOLOG II) cream Apply 1 application topically as needed. 30 g 1   omeprazole (PRILOSEC) 20 MG capsule TAKE 2 CAPSULES BY MOUTH DAILY. 180 capsule 1   tiZANidine (ZANAFLEX) 4 MG tablet Take 1/2 - 1 tablet by mouth three times per day as needed 30 tablet 2   topiramate (TOPAMAX) 50 MG tablet TAKE 1 TABLET BY MOUTH ONCE A DAY 90 tablet 3   traMADol (ULTRAM) 50 MG tablet Take 1 tablet (50 mg total) by mouth every 6 (six) hours as needed. 60 tablet 3   ferrous sulfate 325 (65 FE) MG EC tablet Take 1 tablet (325 mg total) by mouth daily with breakfast (Patient not taking: Reported on 01/07/2021) 30 tablet 11   ferumoxytol (FERAHEME) 510 MG/17ML SOLN injection Inject 17 mLs (510 mg total) into the vein every 7 days for 2 doses. 34 mL 0   Current Facility-Administered Medications on File Prior to Visit  Medication Dose Route Frequency Provider Last Rate Last Admin   iron dextran complex (INFED) 25 mg in sodium chloride 0.9 % 500 mL IVPB  25 mg Intravenous Once Stechschulte, Nickola Major, MD       Followed by   iron dextran complex (INFED) 1,000 mg in sodium chloride 0.9 % 500 mL IVPB  1,000 mg Intravenous Once Stechschulte, Nickola Major, MD            ROS:  All others reviewed and negative.  Objective        PE:  BP 118/72 (BP Location: Right Arm,  Patient Position: Sitting, Cuff Size: Normal)    Pulse 64    Temp 97.6 F (36.4 C) (Oral)    Wt 196 lb 6.4 oz (89.1 kg)    SpO2 97%    BMI 33.71 kg/m                 Constitutional: Pt appears in NAD               HENT: Head: NCAT.  Right Ear: External ear normal.                 Left Ear: External ear normal.                Eyes: . Pupils are equal, round, and reactive to light. Conjunctivae and EOM are normal               Nose: without d/c or deformity               Neck: Neck supple. Gross normal ROM               Cardiovascular: Normal rate and regular rhythm.                 Pulmonary/Chest: Effort normal and breath sounds without rales or wheezing.                Abd:  Soft, NT, ND, + BS, no organomegaly               Neurological: Pt is alert. At baseline orientation, motor grossly intact               Skin: Skin is warm. No rashes, no other new lesions, LE edema - none               Psychiatric: Pt behavior is normal without agitation ,, depressed affect  Micro: none  Cardiac tracings I have personally interpreted today:  none  Pertinent Radiological findings (summarize): none   Lab Results  Component Value Date   WBC 4.4 01/07/2021   HGB 11.6 (L) 01/07/2021   HCT 35.4 (L) 01/07/2021   PLT 333.0 01/07/2021   GLUCOSE 83 01/07/2021   CHOL 221 (H) 01/07/2021   TRIG 132.0 01/07/2021   HDL 91.40 01/07/2021   LDLDIRECT 94.8 04/29/2012   LDLCALC 103 (H) 01/07/2021   ALT 10 01/07/2021   AST 15 01/07/2021   NA 139 01/07/2021   K 4.3 01/07/2021   CL 105 01/07/2021   CREATININE 0.62 01/07/2021   BUN 14 01/07/2021   CO2 28 01/07/2021   TSH 0.80 01/07/2021   INR 0.9 04/23/2020   HGBA1C 5.8 01/07/2021   Assessment/Plan:  Kelli Bell is a 60 y.o. White or Caucasian [1] female with  has a past medical history of ANXIETY (03/30/2007), BACK PAIN (02/20/2009), Essential hypertension, benign (05/31/2012), Family history of anesthesia complication, GERD (48/25/0037), H/O  hiatal hernia, HYPERLIPIDEMIA (02/22/2008), HYPOTHYROIDISM (03/30/2007), MENOPAUSAL DISORDER (02/23/2008), Nocturia, OBSTRUCTIVE SLEEP APNEA (06/29/2007), Osteoarthritis, SINUSITIS- ACUTE-NOS (03/30/2007), Sleep apnea, THORACIC/LUMBOSACRAL NEURITIS/RADICULITIS UNSPEC (02/23/2008), and Wheezing (01/08/2009).  Hypothyroidism Lab Results  Component Value Date   TSH 0.80 01/07/2021   Stable, pt to continue levothyroxine   Hyperlipidemia Lab Results  Component Value Date   LDLCALC 103 (H) 01/07/2021   Uncontrolled mild goal ldl < 100, pt to continue current low chol diet - declines statin   Depression Mild uncontrolled, d/w pt - declines SSRI or counseling referral  Hyperglycemia Lab Results  Component Value Date   HGBA1C 5.8 01/07/2021   Stable, pt to continue current medical treatment - diet   Vitamin D deficiency Last vitamin D Lab Results  Component Value Date   VD25OH 35.75 01/07/2021   Low normal , for double oral replacement   Fatigue Etiology unclear except possible related to depression, for labs including cbc today  Iron deficiency No overt bleeding, also for iron lab f/u  Followup: Return in about 6 months (around 07/08/2021).  Jeneen Rinks  Jenny Reichmann, MD 01/12/2021 3:30 PM [facility]

## 2021-01-07 NOTE — Patient Instructions (Signed)

## 2021-01-12 ENCOUNTER — Encounter: Payer: Self-pay | Admitting: Internal Medicine

## 2021-01-12 NOTE — Assessment & Plan Note (Signed)
Etiology unclear except possible related to depression, for labs including cbc today

## 2021-01-12 NOTE — Assessment & Plan Note (Signed)
Last vitamin D Lab Results  Component Value Date   VD25OH 35.75 01/07/2021   Low normal , for double oral replacement

## 2021-01-12 NOTE — Assessment & Plan Note (Signed)
Mild uncontrolled, d/w pt - declines SSRI or counseling referral

## 2021-01-12 NOTE — Assessment & Plan Note (Signed)
No overt bleeding, also for iron lab f/u 

## 2021-01-12 NOTE — Assessment & Plan Note (Signed)
Lab Results  Component Value Date   TSH 0.80 01/07/2021   Stable, pt to continue levothyroxine

## 2021-01-12 NOTE — Assessment & Plan Note (Signed)
Lab Results  Component Value Date   LDLCALC 103 (H) 01/07/2021   Uncontrolled mild goal ldl < 100, pt to continue current low chol diet - declines statin

## 2021-01-12 NOTE — Assessment & Plan Note (Signed)
Lab Results  Component Value Date   HGBA1C 5.8 01/07/2021   Stable, pt to continue current medical treatment - diet

## 2021-01-28 ENCOUNTER — Telehealth (INDEPENDENT_AMBULATORY_CARE_PROVIDER_SITE_OTHER): Payer: 59 | Admitting: Family Medicine

## 2021-01-28 ENCOUNTER — Ambulatory Visit: Payer: 59 | Admitting: Nurse Practitioner

## 2021-01-28 ENCOUNTER — Telehealth: Payer: Self-pay | Admitting: Internal Medicine

## 2021-01-28 ENCOUNTER — Encounter: Payer: Self-pay | Admitting: Family Medicine

## 2021-01-28 DIAGNOSIS — U071 COVID-19: Secondary | ICD-10-CM

## 2021-01-28 MED ORDER — MOLNUPIRAVIR EUA 200MG CAPSULE: 4.0000 | ORAL_CAPSULE | Freq: Two times a day (BID) | ORAL | 0 refills | Status: DC

## 2021-01-28 MED ORDER — BENZONATATE 100 MG PO CAPS: ORAL_CAPSULE | ORAL | 0 refills | Status: DC

## 2021-01-28 NOTE — Telephone Encounter (Signed)
We can rx paxlovid if her symptoms started < 5 days, o/w should take symptomatic medications only such as mucinex and tylenol

## 2021-01-28 NOTE — Telephone Encounter (Signed)
Left message for patient to call me back. 

## 2021-01-28 NOTE — Progress Notes (Signed)
Virtual Visit via Telephone Note  I connected with Kelli Bell on 01/28/21 at  4:20 PM EST by telephone and verified that I am speaking with the correct person using two identifiers.   I discussed the limitations of performing an evaluation and management service by telephone and requested permission for a phone visit. The patient expressed understanding and agreed to proceed.  Location patient:  [place] Location provider: work or home office Participants present for the call: patient, provider Patient did not have a visit with me in the prior 7 days to address this/these issue(s).   History of Present Illness:  Acute telemedicine visit for Covid19: -Onset: 2 days ago; tested positive for covid -Symptoms include: nasal congestions, sinus headache, PND, ears are uncomfortable, cough, body aches -Denies:fevers, NVD, CP, SOB -drinking fluids and eating -Pertinent past medical history: see below, has had covid once in the past -Pertinent medication allergies:  Allergies  Allergen Reactions   Codeine Nausea Only   Feraheme [Ferumoxytol] Other (See Comments)    Dizzy, almost passed out, very tired and slept for hours after  -COVID-19 vaccine status: 2 dose and a booster Immunization History  Administered Date(s) Administered   H1N1 11/28/2007   Influenza Whole 11/29/2006, 11/28/2007, 10/26/2008   Influenza, Seasonal, Injecte, Preservative Fre 11/10/2012   Influenza,inj,Quad PF,6+ Mos 11/03/2016, 11/09/2017, 10/04/2018   Influenza-Unspecified 11/19/2011, 11/02/2013, 11/27/2019   PFIZER(Purple Top)SARS-COV-2 Vaccination 05/05/2019, 05/29/2019, 02/26/2020   Td 02/23/2008   Tdap 12/20/2019   Zoster Recombinat (Shingrix) 12/20/2019, 02/19/2020      Past Medical History:  Diagnosis Date   ANXIETY 03/30/2007   BACK PAIN 02/20/2009   Essential hypertension, benign 05/31/2012   Family history of anesthesia complication    "mother has hard time waking up"   GERD 01/13/2007   H/O hiatal hernia     HYPERLIPIDEMIA 02/22/2008   HYPOTHYROIDISM 03/30/2007   MENOPAUSAL DISORDER 02/23/2008   Nocturia    OBSTRUCTIVE SLEEP APNEA 06/29/2007   has not used c pap x 8 months due to wt loss   Osteoarthritis    SINUSITIS- ACUTE-NOS 03/30/2007   Sleep apnea    THORACIC/LUMBOSACRAL NEURITIS/RADICULITIS UNSPEC 02/23/2008   Wheezing 01/08/2009    Current Outpatient Medications on File Prior to Visit  Medication Sig Dispense Refill   allopurinol (ZYLOPRIM) 100 MG tablet Take 1 tablet (100 mg total) by mouth 2 (two) times daily. 180 tablet 0   ALPRAZolam (XANAX) 0.25 MG tablet Take 1 tablet (0.25 mg total) by mouth 3 (three) times daily as needed for anxiety. 90 tablet 1   CALCIUM PO Take 5 mLs by mouth daily.     carbamazepine (TEGRETOL XR) 100 MG 12 hr tablet TAKE 2 TABLETS BY MOUTH TWICE A DAY 120 tablet 3   celecoxib (CELEBREX) 200 MG capsule Take 1 capsule (200 mg total) by mouth 2 (two) times daily as needed. 180 capsule 3   colchicine 0.6 MG tablet      Cyanocobalamin (B-12 PO) Take 5 mLs by mouth as needed (low b12).     DULoxetine (CYMBALTA) 60 MG capsule TAKE 2 CAPSULES BY MOUTH DAILY 180 capsule 3   gabapentin (NEURONTIN) 100 MG capsule Take 2 capsules (200 mg total) by mouth at bedtime. 180 capsule 1   levothyroxine (SYNTHROID) 88 MCG tablet TAKE 1 TABLET BY MOUTH EVERY MORNING 90 tablet 3   Multiple Vitamins-Minerals (MULTIVITAMIN ADULT PO) Take 1 tablet by mouth daily.     nystatin-triamcinolone (MYCOLOG II) cream Apply 1 application topically as needed. 30 g 1  omeprazole (PRILOSEC) 20 MG capsule TAKE 2 CAPSULES BY MOUTH DAILY. 180 capsule 1   tiZANidine (ZANAFLEX) 4 MG tablet Take 1/2 - 1 tablet by mouth three times per day as needed 30 tablet 2   topiramate (TOPAMAX) 50 MG tablet TAKE 1 TABLET BY MOUTH ONCE A DAY 90 tablet 3   traMADol (ULTRAM) 50 MG tablet Take 1 tablet (50 mg total) by mouth every 6 (six) hours as needed. 60 tablet 3   ferumoxytol (FERAHEME) 510 MG/17ML SOLN injection  Inject 17 mLs (510 mg total) into the vein every 7 days for 2 doses. 34 mL 0   Current Facility-Administered Medications on File Prior to Visit  Medication Dose Route Frequency Provider Last Rate Last Admin   iron dextran complex (INFED) 25 mg in sodium chloride 0.9 % 500 mL IVPB  25 mg Intravenous Once Stechschulte, Nickola Major, MD       Followed by   iron dextran complex (INFED) 1,000 mg in sodium chloride 0.9 % 500 mL IVPB  1,000 mg Intravenous Once Stechschulte, Nickola Major, MD        Observations/Objective: Patient sounds cheerful and well on the phone. I do not appreciate any SOB. Speech and thought processing are grossly intact. Patient reported vitals: BP 125/79, T 97.5  Assessment and Plan:  COVID-19   Discussed treatment options and risk of drug interactions, ideal treatment window, potential complications, isolation and precautions for COVID-19.  Discussed possibility of rebound with antivirals and the need to reisolate if it should occur for 5 days. Checked for/reviewed any labs done in the last 90 days with GFR listed in HPI if available. After lengthy discussion, the patient opted for treatment with molnupiravir due to being higher risk for complications of covid or severe disease and other factors. She preferred this after discussion sig interactions with several of her medications. Discussed EUA status of this drug and the fact that there is preliminary limited knowledge of risks/interactions/side effects per EUA document vs possible benefits and precautions. This information was shared with patient during the visit and also was provided in patient instructions. The patient did want a prescription for cough, Tessalon Rx sent.  Other symptomatic care measures summarized in patient instructions.  Advised to seek prompt virtual visit or in person care if worsening, new symptoms arise, or if is not improving with treatment as expected per our conversation of expected course. Discussed options  for follow up care. Did let this patient know that I do telemedicine on Tuesdays and Thursdays for [facility] and those are the days I am logged into the system. Advised to schedule follow up visit with PCP, [place] virtual visits or [facility] if any further questions or concerns to avoid delays in care.   I discussed the assessment and treatment plan with the patient. The patient was provided an opportunity to ask questions and all were answered. The patient agreed with the plan and demonstrated an understanding of the instructions.    Follow Up Instructions:  I did not refer this patient for an OV with me in the next 24 hours for this/these issue(s).  I discussed the assessment and treatment plan with the patient. The patient was provided an opportunity to ask questions and all were answered. The patient agreed with the plan and demonstrated an understanding of the instructions.   I spent 16 minutes on the date of this visit in the care of this patient. See summary of tasks completed to properly care for this patient in  the detailed notes above which also included counseling of above, review of PMH, medications, allergies, evaluation of the patient and ordering and/or  instructing patient on testing and care options.     Lucretia Kern, DO

## 2021-01-28 NOTE — Patient Instructions (Addendum)
HOME CARE TIPS:  -I sent the medication(s) we discussed to your pharmacy: Meds ordered this encounter  Medications   molnupiravir EUA (LAGEVRIO) 200 mg CAPS capsule    Sig: Take 4 capsules (800 mg total) by mouth 2 (two) times daily for 5 days.    Dispense:  40 capsule    Refill:  0   benzonatate (TESSALON PERLES) 100 MG capsule    Sig: 1-2 capsules up to twice daily as needed for cough.    Dispense:  30 capsule    Refill:  0     -I sent in the [place] treatment or referral you requested per our discussion. Please see the information provided below and discuss further with the pharmacist/treatment team.    -there is a chance of rebound illness after finishing your treatment. If you become sick again please isolate for an additional 5 days, plus 5 more days of masking.   -can use tylenol  if needed for fevers, aches and pains per instructions  -can use nasal saline a few times per day if you have nasal congestion  -gargle salt water twice daily   -stay hydrated, drink plenty of fluids and eat small healthy meals - avoid dairy  -can take 1000 IU (68mg) Vit D3 and 100-500 mg of Vit C daily per instructions  -follow up with your doctor in 2-3 days unless improving and feeling better  -stay home while sick, except to seek medical care. If you have COVID19, you will likely be contagious for 7-10 days. Flu or Influenza is likely contagious for about 7 days. Other respiratory viral infections remain contagious for 5-10+ days depending on the virus and many other factors. Wear a good mask that fits snugly (such as N95 or KN95) if around others to reduce the risk of transmission.  It was nice to meet you today, and I really hope you are feeling better soon. I help [facility] out with telemedicine visits on Tuesdays and Thursdays and am happy to help if you need a follow up virtual visit on those days. Otherwise, if you have any concerns or questions following this visit please schedule a  follow up visit with your Primary Care doctor or seek care at a local urgent care clinic to avoid delays in care.    Seek in person care or schedule a follow up video visit promptly if your symptoms worsen, new concerns arise or you are not improving with treatment. Call 911 and/or seek emergency care if your symptoms are severe or life threatening.    Fact Sheet for Patients And Caregivers Emergency Use Authorization (EUA) Of LAGEVRIO (molnupiravir) capsules For Coronavirus Disease 2019 (COVID-19)  What is the most important information I should know about LAGEVRIO? LAGEVRIO may cause serious side effects, including: ? LAGEVRIO may cause harm to your unborn baby. It is not known if LAGEVRIO will harm your baby if you take LAGEVRIO during pregnancy. o LAGEVRIO is not recommended for use in pregnancy. o LAGEVRIO has not been studied in pregnancy. LAGEVRIO was studied in pregnant animals only. When LAGEVRIO was given to pregnant animals, LAGEVRIO caused harm to their unborn babies. o You and your healthcare provider may decide that you should take LAGEVRIO during pregnancy if there are no other COVID-19 treatment options approved or authorized by the FDA that are accessible or clinically appropriate for you. o If you and your healthcare provider decide that you should take LAGEVRIO during pregnancy, you and your healthcare provider should discuss the known and  potential benefits and the potential risks of taking LAGEVRIO during pregnancy. For individuals who are able to become pregnant: ? You should use a reliable method of birth control (contraception) consistently and correctly during treatment with LAGEVRIO and for 4 days after the last dose of LAGEVRIO. Talk to your healthcare provider about reliable birth control methods. ? Before starting treatment with [facility] your healthcare provider may do a pregnancy test to see if you are pregnant before starting treatment with LAGEVRIO. ?  Tell your healthcare provider right away if you become pregnant or think you may be pregnant during treatment with LAGEVRIO. Pregnancy Surveillance Program: ? There is a pregnancy surveillance program for individuals who take LAGEVRIO during pregnancy. The purpose of this program is to collect information about the health of you and your baby. Talk to your healthcare provider about how to take part in this program. ? If you take LAGEVRIO during pregnancy and you agree to participate in the pregnancy surveillance program and allow your healthcare provider to share your information with [place], then your healthcare provider will report your use of [place] during pregnancy to [place]. by calling [phone] or [email or web address]. For individuals who are sexually active with partners who are able to become pregnant: ? It is not known if LAGEVRIO can affect sperm. While the risk is regarded as low, animal studies to fully assess the potential for LAGEVRIO to affect the babies of males treated with LAGEVRIO have not been completed. A reliable method of birth control (contraception) should be used consistently and correctly during treatment with LAGEVRIO and for at least 3 months after the last dose. The risk to sperm beyond 3 months is not known. Studies to understand the risk to sperm beyond 3 months are ongoing. Talk to your healthcare provider about reliable birth control methods. Talk to your healthcare provider if you have questions or concerns about how LAGEVRIO may affect sperm. You are being given this fact sheet because your healthcare provider believes it is necessary to provide you with LAGEVRIO for the treatment of adults with mild-to-moderate coronavirus disease 2019 (COVID-19) with positive results of direct SARS-CoV-2 viral testing, and who are at high risk for progression to severe COVID-19 including hospitalization or death, and for whom  other COVID-19 treatment options approved or authorized by the FDA are not accessible or clinically appropriate. The U.S. Food and Drug Administration (FDA) has issued an Emergency Use Authorization (EUA) to make LAGEVRIO available during the COVID-19 pandemic (for more details about an EUA please see What is an Emergency Use Authorization? at the end of this document). LAGEVRIO is not an FDA-approved medicine in the [place]. Read this Fact Sheet for information about LAGEVRIO. Talk to your healthcare provider about your options if you have any questions. It is your choice to take LAGEVRIO.  What is COVID-19? COVID-19 is caused by a virus called a coronavirus. You can get COVID-19 through close contact with another person who has the virus. COVID-19 illnesses have ranged from very mild-to-severe, including illness resulting in death. While information so far suggests that most COVID-19 illness is mild, serious illness can happen and may cause some of your other medical conditions to become worse. Older people and people of all ages with severe, long lasting (chronic) medical conditions like heart disease, lung disease and diabetes, for example seem to be at higher risk of being hospitalized for COVID-19.  What is LAGEVRIO? LAGEVRIO is an investigational medicine used to treat  mild-to-moderate COVID-19 in adults: ? with positive results of direct SARS-CoV-2 viral testing, and ? who are at high risk for progression to severe COVID-19 including hospitalization or death, and for whom other COVID-19 treatment options approved or authorized by the FDA are not accessible or clinically appropriate. The FDA has authorized the emergency use of LAGEVRIO for the treatment of mild-tomoderate COVID-19 in adults under an EUA. For more information on EUA, see the What is an Emergency Use Authorization (EUA)? section at the end of this Fact Sheet. LAGEVRIO is not authorized: ? for use in people  less than 1 years of age. ? for prevention of COVID-19. ? for people needing hospitalization for COVID-19. ? for use for longer than 5 consecutive days.  What should I tell my healthcare provider before I take LAGEVRIO? Tell your healthcare provider if you: ? Have any allergies ? Are breastfeeding or plan to breastfeed ? Have any serious illnesses ? Are taking any medicines (prescription, over-the-counter, vitamins, or herbal products).  How do I take LAGEVRIO? ? Take LAGEVRIO exactly as your healthcare provider tells you to take it. ? Take 4 capsules of LAGEVRIO every 12 hours (for example, at 8 am and at 8 pm) ? Take LAGEVRIO for 5 days. It is important that you complete the full 5 days of treatment with LAGEVRIO. Do not stop taking LAGEVRIO before you complete the full 5 days of treatment, even if you feel better. ? Take LAGEVRIO with or without food. ? You should stay in isolation for as long as your healthcare provider tells you to. Talk to your healthcare provider if you are not sure about how to properly isolate while you have COVID-19. ? Swallow LAGEVRIO capsules whole. Do not open, break, or crush the capsules. If you cannot swallow capsules whole, tell your healthcare provider. ? What to do if you miss a dose: o If it has been less than 10 hours since the missed dose, take it as soon as you remember o If it has been more than 10 hours since the missed dose, skip the missed dose and take your dose at the next scheduled time. ? Do not double the dose of LAGEVRIO to make up for a missed dose.  What are the important possible side effects of LAGEVRIO? ? See, What is the most important information I should know about LAGEVRIO? ? Allergic Reactions. Allergic reactions can happen in people taking LAGEVRIO, even after only 1 dose. Stop taking LAGEVRIO and call your healthcare provider right away if you get any of the following symptoms of an allergic reaction: o hives o  rapid heartbeat o trouble swallowing or breathing o swelling of the mouth, lips, or face o throat tightness o hoarseness o skin rash The most common side effects of LAGEVRIO are: ? diarrhea ? nausea ? dizziness These are not all the possible side effects of LAGEVRIO. Not many people have taken LAGEVRIO. Serious and unexpected side effects may happen. This medicine is still being studied, so it is possible that all of the risks are not known at this time.  What other treatment choices are there?  Veklury (remdesivir) is FDA-approved as an intravenous (IV) infusion for the treatment of mildto-moderate KZSWF-09 in certain adults and children. Talk with your doctor to see if Marijean Heath is appropriate for you. Like LAGEVRIO, FDA may also allow for the emergency use of other medicines to treat people with COVID-19. Go to LacrosseProperties.si for more information. It is your choice to be treated  or not to be treated with LAGEVRIO. Should you decide not to take it, it will not change your standard medical care.  What if I am breastfeeding? Breastfeeding is not recommended during treatment with LAGEVRIO and for 4 days after the last dose of LAGEVRIO. If you are breastfeeding or plan to breastfeed, talk to your healthcare provider about your options and specific situation before taking LAGEVRIO.  How do I report side effects with LAGEVRIO? Contact your healthcare provider if you have any side effects that bother you or do not go away. Report side effects to FDA MedWatch at SmoothHits.hu or call 1-800-FDA-1088 (1- (320)097-5900).  How should I store [place]? ? Store LAGEVRIO capsules at room temperature between 76F to 38F (20C to 25C). ? Keep LAGEVRIO and all medicines out of the reach of children and pets. How can I learn more about COVID-19? ? Ask your healthcare provider. ?  Visit SeekRooms.co.uk ? Contact your local or state public health department. ? Call [place] at [phone] (toll free in the [place] ? Visit www.molnupiravir.com  What Is an Emergency Use Authorization (EUA)? The [place] FDA has made [place] available under an emergency access mechanism called an Emergency Use Authorization (EUA) The EUA is supported by a Presenter, broadcasting Health and Human Service (HHS) declaration that circumstances exist to justify emergency use of drugs and biological products during the COVID-19 pandemic. LAGEVRIO for the treatment of mild-to-moderate COVID-19 in adults with positive results of direct SARS-CoV-2 viral testing, who are at high risk for progression to severe COVID-19, including hospitalization or death, and for whom alternative COVID-19 treatment options approved or authorized by FDA are not accessible or clinically appropriate, has not undergone the same type of review as an FDA-approved product. In issuing an EUA under the FEOFH-21 public health emergency, the FDA has determined, among other things, that based on the total amount of scientific evidence available including data from adequate and well-controlled clinical trials, if available, it is reasonable to believe that the product may be effective for diagnosing, treating, or preventing COVID-19, or a serious or life-threatening disease or condition caused by COVID-19; that the known and potential benefits of the product, when used to diagnose, treat, or prevent such disease or condition, outweigh the known and potential risks of such product; and that there are no adequate, approved, and available alternatives.  All of these criteria must be met to allow for the product to be used in the treatment of patients during the COVID-19 pandemic. The EUA for LAGEVRIO is in effect for the duration of the COVID-19 declaration justifying emergency use of LAGEVRIO, unless terminated or  revoked (after which LAGEVRIO may no longer be used under the EUA). For patent information: http://rogers.info/ Copyright  2021-2022 [place], [place] and its affiliates. All rights reserved. [id number] Revised: March 2022

## 2021-01-28 NOTE — Telephone Encounter (Signed)
Connected to [facility] 1.2.2023.   Caller states she has COVID and she would like to have a script called in. She has a sinus infection and would like for antibiotics to be called in. Symptoms include headaches and both of her ears are hurting.   Advised to see PCP within 24 hours.

## 2021-01-29 ENCOUNTER — Encounter: Payer: Self-pay | Admitting: Family Medicine

## 2021-01-30 MED ORDER — MOLNUPIRAVIR EUA 200MG CAPSULE: 4.0000 | ORAL_CAPSULE | Freq: Two times a day (BID) | ORAL | 0 refills | Status: AC

## 2021-03-08 ENCOUNTER — Other Ambulatory Visit (HOSPITAL_COMMUNITY): Payer: Self-pay

## 2021-03-08 ENCOUNTER — Other Ambulatory Visit: Payer: Self-pay | Admitting: Internal Medicine

## 2021-03-08 DIAGNOSIS — K2100 Gastro-esophageal reflux disease with esophagitis, without bleeding: Secondary | ICD-10-CM

## 2021-03-08 DIAGNOSIS — E039 Hypothyroidism, unspecified: Secondary | ICD-10-CM

## 2021-03-08 MED ORDER — ALLOPURINOL 100 MG PO TABS
100.0000 mg | ORAL_TABLET | Freq: Two times a day (BID) | ORAL | 3 refills | Status: DC
  Filled 2021-03-08 (×2): qty 180, 90d supply, fill #0
  Filled 2021-07-18: qty 180, 90d supply, fill #1
  Filled 2021-10-05: qty 180, 90d supply, fill #2
  Filled 2022-01-05: qty 180, 90d supply, fill #3

## 2021-03-08 MED ORDER — LEVOTHYROXINE SODIUM 88 MCG PO TABS
ORAL_TABLET | Freq: Every morning | ORAL | 3 refills | Status: DC
  Filled 2021-03-08: qty 90, fill #0
  Filled 2021-03-08: qty 90, 90d supply, fill #0
  Filled 2021-07-18: qty 90, 90d supply, fill #1
  Filled 2021-10-05: qty 90, 90d supply, fill #2
  Filled 2022-01-05: qty 90, 90d supply, fill #3

## 2021-03-08 MED ORDER — ALPRAZOLAM 0.25 MG PO TABS
0.2500 mg | ORAL_TABLET | Freq: Three times a day (TID) | ORAL | 1 refills | Status: DC | PRN
  Filled 2021-03-08: qty 90, 30d supply, fill #0
  Filled 2021-06-07 – 2021-06-09 (×2): qty 90, 30d supply, fill #1

## 2021-03-08 MED FILL — Duloxetine HCl Enteric Coated Pellets Cap 60 MG (Base Eq): ORAL | 90 days supply | Qty: 180 | Fill #2 | Status: AC

## 2021-03-08 MED FILL — Topiramate Tab 50 MG: ORAL | 90 days supply | Qty: 90 | Fill #3 | Status: AC

## 2021-03-09 NOTE — Telephone Encounter (Signed)
Please refill as per office routine med refill policy (all routine meds to be refilled for 3 mo or monthly (per pt preference) up to one year from last visit, then month to month grace period for 3 mo, then further med refills will have to be denied) ? ?

## 2021-03-10 ENCOUNTER — Other Ambulatory Visit (HOSPITAL_COMMUNITY): Payer: Self-pay

## 2021-03-10 MED ORDER — OMEPRAZOLE 20 MG PO CPDR
DELAYED_RELEASE_CAPSULE | Freq: Every day | ORAL | 2 refills | Status: DC
  Filled 2021-03-10: qty 180, 90d supply, fill #0
  Filled 2021-07-18: qty 180, 90d supply, fill #1
  Filled 2021-10-05: qty 180, 90d supply, fill #2

## 2021-03-28 NOTE — Telephone Encounter (Signed)
NOTE NOT NEEDED ?

## 2021-04-07 ENCOUNTER — Telehealth: Payer: Self-pay | Admitting: Internal Medicine

## 2021-04-07 DIAGNOSIS — E611 Iron deficiency: Secondary | ICD-10-CM

## 2021-04-07 NOTE — Telephone Encounter (Signed)
Ok labs are ordered 

## 2021-04-07 NOTE — Telephone Encounter (Signed)
Patient calling in ? ?Patient would like for provider to put order in w/ lab to have blood drawn for iron level.. says she has been feeling extremely fatigue & lethargic  ? ?Please call [phone] when order hs been placed ?

## 2021-04-08 ENCOUNTER — Other Ambulatory Visit (INDEPENDENT_AMBULATORY_CARE_PROVIDER_SITE_OTHER): Payer: 59

## 2021-04-08 DIAGNOSIS — E611 Iron deficiency: Secondary | ICD-10-CM

## 2021-04-08 LAB — CBC WITH DIFFERENTIAL/PLATELET
Basophils Absolute: 0.1 10*3/uL (ref 0.0–0.1)
Basophils Relative: 1.2 % (ref 0.0–3.0)
Eosinophils Absolute: 0.1 10*3/uL (ref 0.0–0.7)
Eosinophils Relative: 1.8 % (ref 0.0–5.0)
HCT: 38.5 % (ref 36.0–46.0)
Hemoglobin: 12.8 g/dL (ref 12.0–15.0)
Lymphocytes Relative: 44.8 % (ref 12.0–46.0)
Lymphs Abs: 2.1 10*3/uL (ref 0.7–4.0)
MCHC: 33.2 g/dL (ref 30.0–36.0)
MCV: 94.2 fl (ref 78.0–100.0)
Monocytes Absolute: 0.4 10*3/uL (ref 0.1–1.0)
Monocytes Relative: 9.1 % (ref 3.0–12.0)
Neutro Abs: 2 10*3/uL (ref 1.4–7.7)
Neutrophils Relative %: 43.1 % (ref 43.0–77.0)
Platelets: 332 10*3/uL (ref 150.0–400.0)
RBC: 4.09 Mil/uL (ref 3.87–5.11)
RDW: 14 % (ref 11.5–15.5)
WBC: 4.6 10*3/uL (ref 4.0–10.5)

## 2021-04-08 LAB — IBC PANEL
Iron: 50 ug/dL (ref 42–145)
Saturation Ratios: 15.1 % — ABNORMAL LOW (ref 20.0–50.0)
TIBC: 330.4 ug/dL (ref 250.0–450.0)
Transferrin: 236 mg/dL (ref 212.0–360.0)

## 2021-04-08 LAB — FERRITIN: Ferritin: 18.9 ng/mL (ref 10.0–291.0)

## 2021-04-08 NOTE — Telephone Encounter (Signed)
Patient notified

## 2021-04-10 ENCOUNTER — Other Ambulatory Visit: Payer: Self-pay | Admitting: Internal Medicine

## 2021-05-14 ENCOUNTER — Other Ambulatory Visit (HOSPITAL_COMMUNITY): Payer: Self-pay

## 2021-05-14 MED ORDER — DULOXETINE HCL 30 MG PO CPEP
ORAL_CAPSULE | Freq: Every day | ORAL | 0 refills | Status: DC
Start: 2021-05-14 — End: 2022-05-13
  Filled 2021-05-14: qty 30, 30d supply, fill #0

## 2021-05-14 MED ORDER — ESCITALOPRAM OXALATE 20 MG PO TABS
ORAL_TABLET | Freq: Every day | ORAL | 4 refills | Status: DC
  Filled 2021-05-14: qty 85, 85d supply, fill #0
  Filled 2021-05-14: qty 5, 5d supply, fill #0

## 2021-05-15 ENCOUNTER — Other Ambulatory Visit (HOSPITAL_COMMUNITY): Payer: Self-pay

## 2021-06-09 ENCOUNTER — Other Ambulatory Visit (HOSPITAL_COMMUNITY): Payer: Self-pay

## 2021-06-11 ENCOUNTER — Other Ambulatory Visit (HOSPITAL_COMMUNITY): Payer: Self-pay

## 2021-07-11 ENCOUNTER — Ambulatory Visit: Payer: 59 | Admitting: Internal Medicine

## 2021-07-18 ENCOUNTER — Other Ambulatory Visit: Payer: Self-pay | Admitting: Internal Medicine

## 2021-07-18 ENCOUNTER — Other Ambulatory Visit (HOSPITAL_COMMUNITY): Payer: Self-pay

## 2021-07-18 MED ORDER — TOPIRAMATE 50 MG PO TABS
ORAL_TABLET | Freq: Every day | ORAL | 1 refills | Status: DC
  Filled 2021-07-18: qty 90, 90d supply, fill #0
  Filled 2021-10-05: qty 90, 90d supply, fill #1

## 2021-07-21 ENCOUNTER — Other Ambulatory Visit (HOSPITAL_COMMUNITY): Payer: Self-pay

## 2021-07-21 ENCOUNTER — Other Ambulatory Visit: Payer: Self-pay | Admitting: Internal Medicine

## 2021-07-21 MED ORDER — GABAPENTIN 100 MG PO CAPS
200.0000 mg | ORAL_CAPSULE | Freq: Every day | ORAL | 1 refills | Status: DC
  Filled 2021-07-21: qty 180, 90d supply, fill #0
  Filled 2021-10-05: qty 180, 90d supply, fill #1

## 2021-07-22 ENCOUNTER — Other Ambulatory Visit (HOSPITAL_COMMUNITY): Payer: Self-pay

## 2021-07-22 MED ORDER — ESCITALOPRAM OXALATE 20 MG PO TABS
ORAL_TABLET | ORAL | 4 refills | Status: DC
  Filled 2021-07-22: qty 45, 30d supply, fill #0

## 2021-07-24 ENCOUNTER — Other Ambulatory Visit (HOSPITAL_COMMUNITY): Payer: Self-pay

## 2021-07-24 MED ORDER — ESCITALOPRAM OXALATE 10 MG PO TABS
ORAL_TABLET | Freq: Every day | ORAL | 4 refills | Status: DC
  Filled 2021-07-24: qty 90, 90d supply, fill #0
  Filled 2021-10-06: qty 90, 90d supply, fill #1
  Filled 2022-01-05: qty 90, 90d supply, fill #2
  Filled 2022-05-14: qty 90, 90d supply, fill #3

## 2021-07-24 MED ORDER — ESCITALOPRAM OXALATE 20 MG PO TABS
ORAL_TABLET | Freq: Every day | ORAL | 4 refills | Status: DC
Start: 2021-07-24 — End: 2022-08-20
  Filled 2021-07-24: qty 90, 90d supply, fill #0
  Filled 2021-10-05: qty 90, 90d supply, fill #1
  Filled 2022-01-05: qty 90, 90d supply, fill #2
  Filled 2022-05-14: qty 90, 90d supply, fill #3

## 2021-08-27 ENCOUNTER — Other Ambulatory Visit (HOSPITAL_COMMUNITY): Payer: Self-pay

## 2021-08-30 ENCOUNTER — Other Ambulatory Visit: Payer: Self-pay | Admitting: Internal Medicine

## 2021-08-30 MED ORDER — ALPRAZOLAM 0.25 MG PO TABS
0.2500 mg | ORAL_TABLET | Freq: Three times a day (TID) | ORAL | 2 refills | Status: AC | PRN
Start: 2021-08-30 — End: 2022-02-26
  Filled 2021-08-30: qty 90, 30d supply, fill #0

## 2021-09-01 ENCOUNTER — Other Ambulatory Visit (HOSPITAL_COMMUNITY): Payer: Self-pay

## 2021-10-02 DIAGNOSIS — F3342 Major depressive disorder, recurrent, in full remission: Secondary | ICD-10-CM | POA: Diagnosis not present

## 2021-10-02 DIAGNOSIS — F411 Generalized anxiety disorder: Secondary | ICD-10-CM | POA: Diagnosis not present

## 2021-10-04 ENCOUNTER — Other Ambulatory Visit: Payer: Self-pay | Admitting: Internal Medicine

## 2021-10-04 ENCOUNTER — Other Ambulatory Visit (HOSPITAL_COMMUNITY): Payer: Self-pay

## 2021-10-05 ENCOUNTER — Other Ambulatory Visit: Payer: Self-pay | Admitting: Internal Medicine

## 2021-10-05 MED ORDER — CELECOXIB 200 MG PO CAPS
200.0000 mg | ORAL_CAPSULE | Freq: Two times a day (BID) | ORAL | 2 refills | Status: DC | PRN
  Filled 2021-10-05: qty 180, 90d supply, fill #0
  Filled 2022-01-05: qty 180, 90d supply, fill #1
  Filled 2022-05-04: qty 180, 90d supply, fill #2

## 2021-10-06 ENCOUNTER — Other Ambulatory Visit (HOSPITAL_COMMUNITY): Payer: Self-pay

## 2021-10-08 ENCOUNTER — Other Ambulatory Visit (HOSPITAL_COMMUNITY): Payer: Self-pay

## 2021-10-14 ENCOUNTER — Other Ambulatory Visit (HOSPITAL_COMMUNITY): Payer: Self-pay

## 2021-10-15 ENCOUNTER — Other Ambulatory Visit (HOSPITAL_COMMUNITY): Payer: Self-pay

## 2021-10-16 ENCOUNTER — Other Ambulatory Visit (HOSPITAL_COMMUNITY): Payer: Self-pay

## 2021-12-31 ENCOUNTER — Other Ambulatory Visit: Payer: Self-pay | Admitting: Internal Medicine

## 2022-01-05 ENCOUNTER — Other Ambulatory Visit (HOSPITAL_COMMUNITY): Payer: Self-pay

## 2022-01-05 ENCOUNTER — Other Ambulatory Visit: Payer: Self-pay | Admitting: Internal Medicine

## 2022-01-05 DIAGNOSIS — K2100 Gastro-esophageal reflux disease with esophagitis, without bleeding: Secondary | ICD-10-CM

## 2022-01-05 MED ORDER — GABAPENTIN 100 MG PO CAPS
200.0000 mg | ORAL_CAPSULE | Freq: Every day | ORAL | 0 refills | Status: DC
  Filled 2022-01-05: qty 180, 90d supply, fill #0

## 2022-01-05 MED ORDER — TOPIRAMATE 50 MG PO TABS
ORAL_TABLET | Freq: Every day | ORAL | 0 refills | Status: DC
  Filled 2022-01-05: qty 90, 90d supply, fill #0

## 2022-01-05 NOTE — Telephone Encounter (Signed)
Please refill as per office routine med refill policy (all routine meds to be refilled for 3 mo or monthly (per pt preference) up to one year from last visit, then month to month grace period for 3 mo, then further med refills will have to be denied) ? ?

## 2022-01-06 ENCOUNTER — Other Ambulatory Visit (HOSPITAL_COMMUNITY): Payer: Self-pay

## 2022-01-06 MED ORDER — OMEPRAZOLE 20 MG PO CPDR
40.0000 mg | DELAYED_RELEASE_CAPSULE | Freq: Every day | ORAL | 0 refills | Status: DC
  Filled 2022-01-06: qty 60, 30d supply, fill #0

## 2022-01-07 ENCOUNTER — Other Ambulatory Visit (HOSPITAL_COMMUNITY): Payer: Self-pay

## 2022-03-27 DIAGNOSIS — F3342 Major depressive disorder, recurrent, in full remission: Secondary | ICD-10-CM | POA: Diagnosis not present

## 2022-03-27 DIAGNOSIS — F411 Generalized anxiety disorder: Secondary | ICD-10-CM | POA: Diagnosis not present

## 2022-03-29 IMAGING — MR MR ABDOMEN WO/W CM
19 of 22 series · 45 of 48 positions shown · IV contrast (gadavist)
Comparison: CT evaluation April 24, 2020

CLINICAL DATA: Liver lesion discovered on CT images. 59-year-old
female.

EXAM:
MRI ABDOMEN WITHOUT AND WITH CONTRAST
TECHNIQUE: Multiplanar multisequence MR imaging of the abdomen was performed
both before and after the administration of intravenous contrast.
CONTRAST:  8mL GADAVIST GADOBUTROL 1 MMOL/ML IV SOLN

[Series 3: T2 · coronal · 6.0mm · 1.56mm/px · 1 of 32 slices shown (1 of 2)]
[im 1/32]
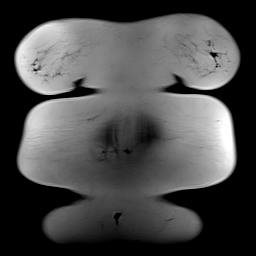

[Series 4: T2 fat-sat · 1 of 8 slices shown (1 of 2)]
[im 1/8]
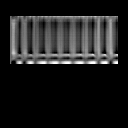

[Series 5: T2 fat-sat · axial · 6.0mm · 1.25mm/px · 1 of 41 slices shown (2 of 2)]
[im 1/41]
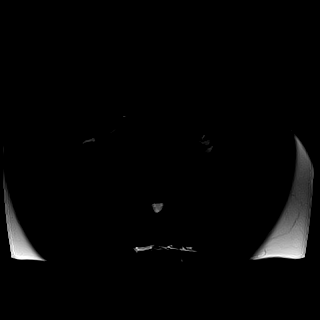

[Series 6: T1 · axial · 3.0mm · 1.25mm/px · z∈[-124,+137]mm · 2 of 88 slices shown (1 of 2)]
[im 1/88]
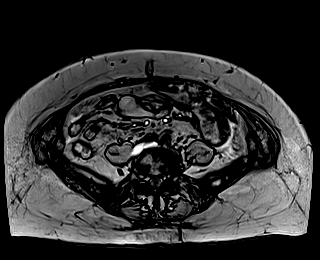
[im 88/88]
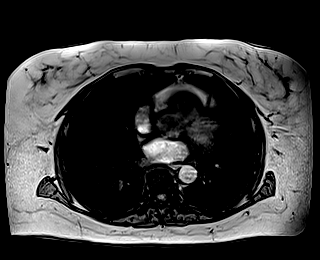

[Series 7: T1 · axial · 3.0mm · 1.25mm/px · z∈[-124,+137]mm · 3 of 88 slices shown (2 of 2)]
[im 1/88]
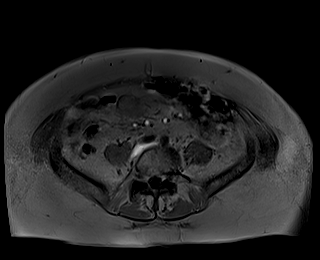
[im 44/88]
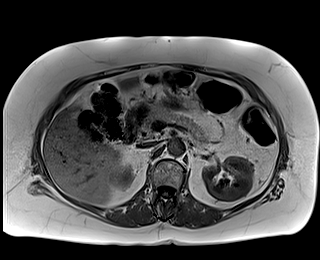
[im 88/88]
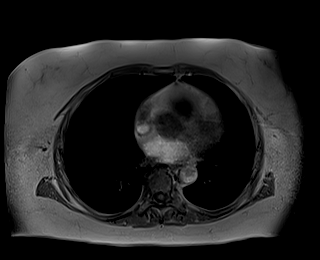

[Series 8: DWI · axial · 6.0mm · 1.49mm/px · z∈[-120,+161]mm · 3 of 80 slices shown (1 of 2)]
[im 1/80]
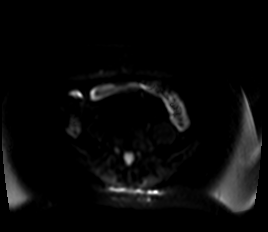
[im 40/80]
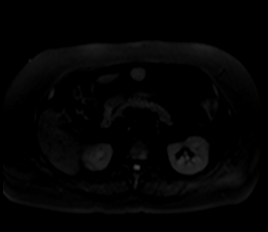
[im 80/80]
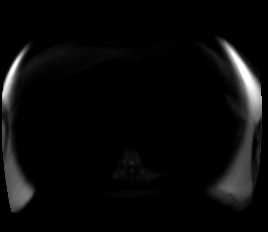

[Series 9: DWI · axial · 6.0mm · 1.49mm/px · 1 of 40 slices shown (2 of 2)]
[im 1/40]
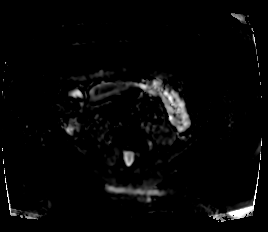

[Series 10: bSSFP · axial · 4.0mm · 0.84mm/px · z∈[-110,+146]mm · 2 of 65 slices shown]
[im 1/65]
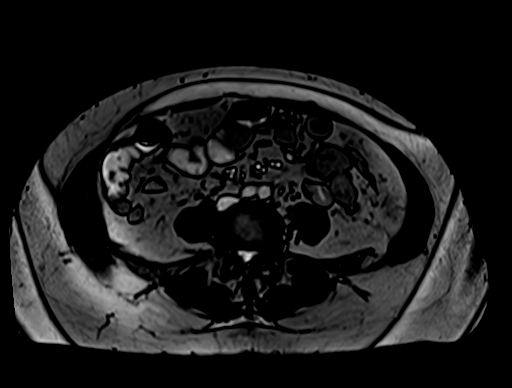
[im 65/65]
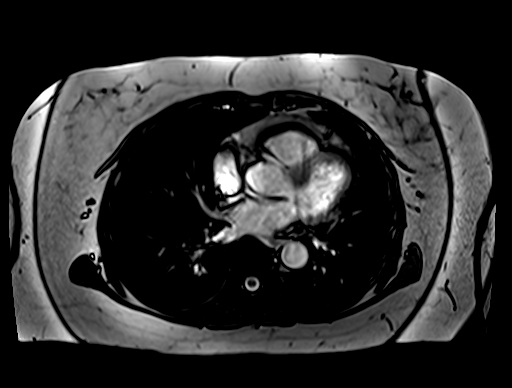

[Series 12: T1 dynamic · axial · 3.0mm · 1.25mm/px · z∈[-120,+117]mm · 3 of 80 slices shown (1 of 10)]
[im 1/80]
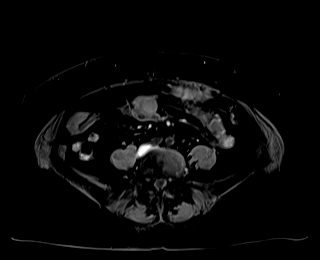
[im 40/80]
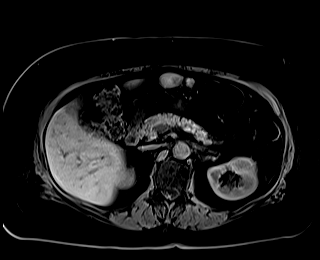
[im 80/80]
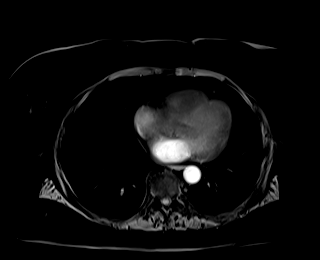

[Series 16: T1 dynamic · axial · 3.0mm · 1.25mm/px · z∈[-120,+117]mm · 3 of 80 slices shown (2 of 10)]
[im 1/80]
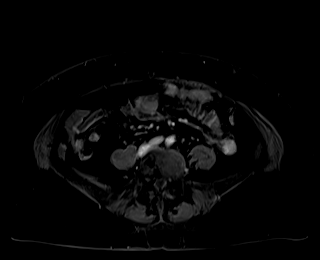
[im 40/80]
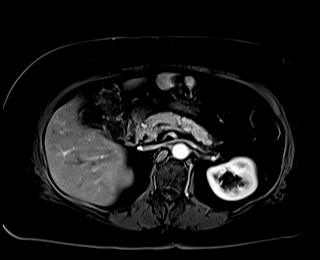
[im 80/80]
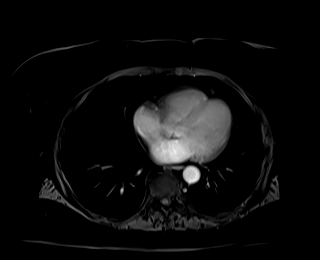

[Series 17: T1 dynamic · axial · 3.0mm · 1.25mm/px · z∈[-120,+117]mm · 3 of 80 slices shown (3 of 10)]
[im 1/80]
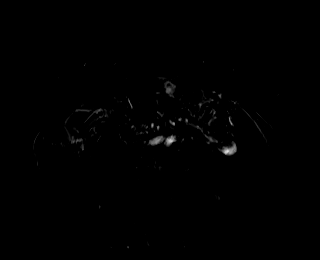
[im 40/80]
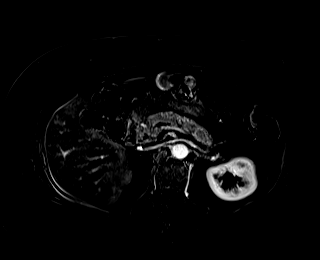
[im 80/80]
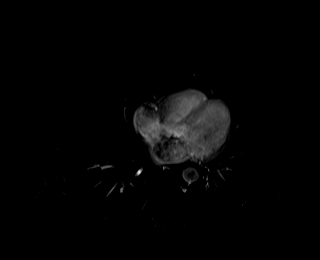

[Series 20: T1 dynamic · axial · 3.0mm · 1.25mm/px · z∈[-120,+117]mm · 3 of 80 slices shown (4 of 10)]
[im 1/80]
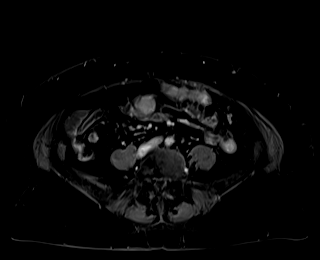
[im 40/80]
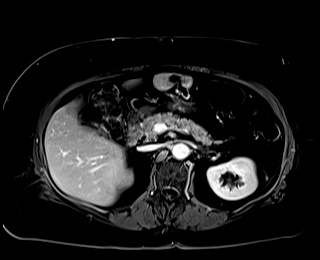
[im 80/80]
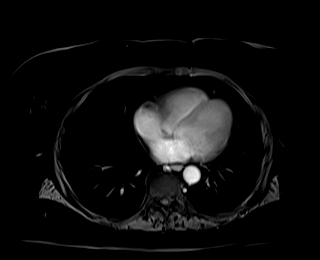

[Series 21: T1 dynamic · axial · 3.0mm · 1.25mm/px · z∈[-120,+117]mm · 3 of 80 slices shown (5 of 10)]
[im 1/80]
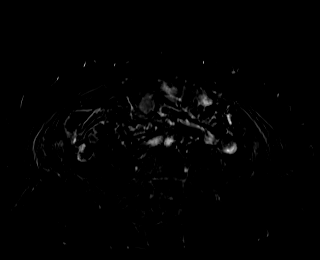
[im 40/80]
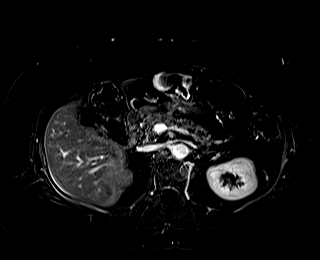
[im 80/80]
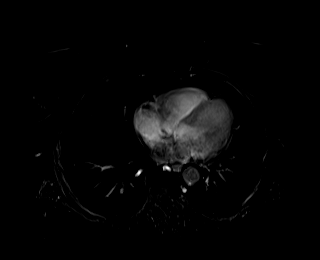

[Series 24: T1 dynamic · axial · 3.0mm · 1.25mm/px · z∈[-120,+117]mm · 3 of 80 slices shown (6 of 10)]
[im 1/80]
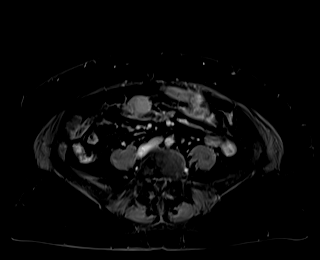
[im 40/80]
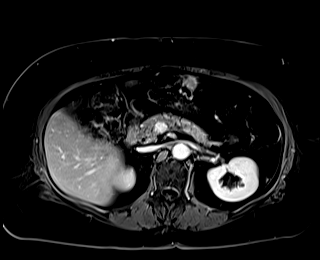
[im 80/80]
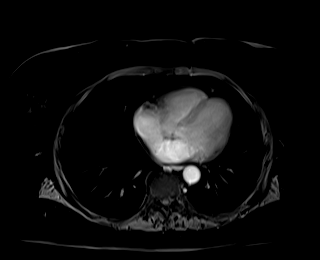

[Series 25: T1 dynamic · axial · 3.0mm · 1.25mm/px · z∈[-120,+117]mm · 3 of 80 slices shown (7 of 10)]
[im 1/80]
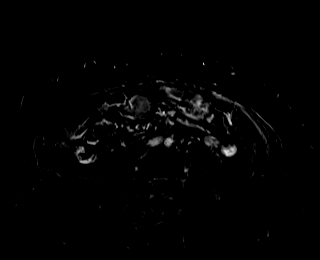
[im 40/80]
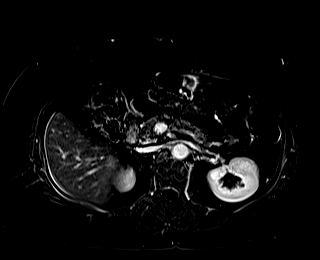
[im 80/80]
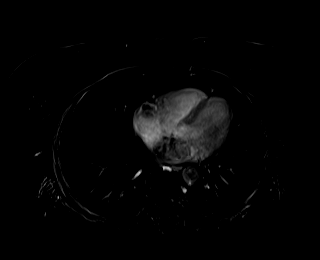

[Series 27: T1 dynamic · coronal · 3.0mm · 1.25mm/px · 3 of 80 slices shown (8 of 10)]
[im 1/80]
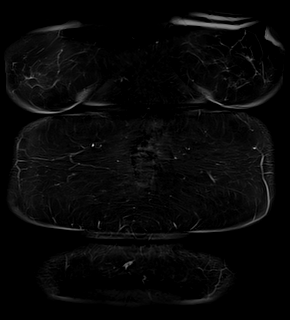
[im 40/80]
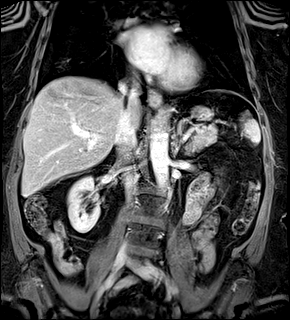
[im 80/80]
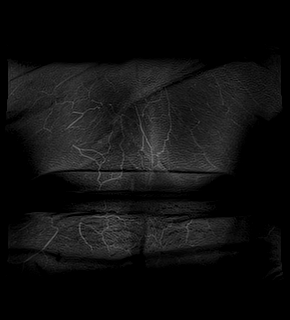

[Series 28: T2 · axial · 6.0mm · 1.56mm/px · 1 of 38 slices shown (2 of 2)]
[im 1/38]
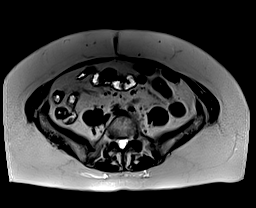

[Series 31: T1 dynamic · axial · 3.0mm · 1.25mm/px · z∈[-120,+117]mm · 3 of 80 slices shown (9 of 10)]
[im 1/80]
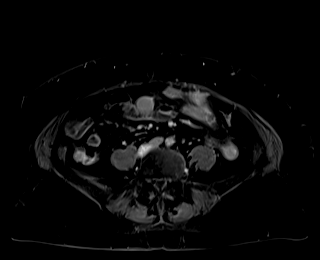
[im 40/80]
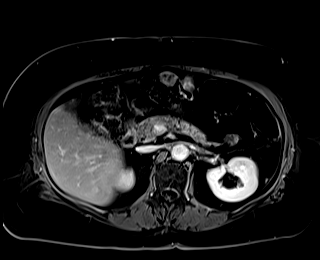
[im 80/80]
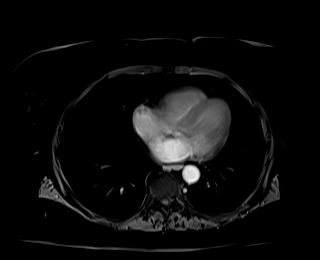

[Series 32: T1 dynamic · axial · 3.0mm · 1.25mm/px · z∈[-120,+117]mm · 3 of 80 slices shown (10 of 10)]
[im 1/80]
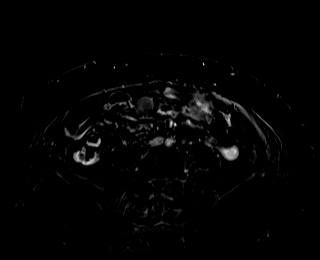
[im 40/80]
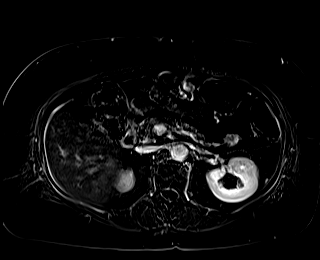
[im 80/80]
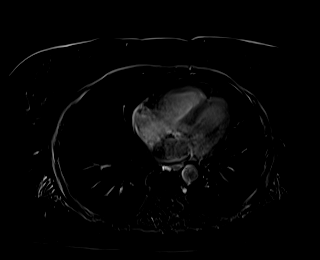

[45 of 48 positions shown; findings below may reference images not displayed]

FINDINGS: Lower chest: Incidental imaging of the lung bases is unremarkable.
No sign of effusion. No consolidation.

Hepatobiliary: Area in the central liver is isointense on fat
saturated T2 to the remainder of the liver. Isointense on in phase
T1 weighted gradient echo imaging. Hypointense on out of phase
compatible with focal fat with geographic features in the porta
hepatis. Post cholecystectomy without biliary duct dilation.

No focal, suspicious hepatic lesion. Portal vein is patent. Hepatic
veins are patent.

Pancreas: Normal intrinsic T1 signal. No ductal dilation or sign of
inflammation. No focal lesion.

Adrenals/Urinary Tract: Normal adrenal glands. Small RIGHT renal
cyst. Symmetric renal enhancement. No hydronephrosis.

Stomach/Bowel: Post gastric bypass. No acute gastrointestinal
process to the extent evaluated on abdominal MRI.

Vascular/Lymphatic: Patent abdominal vasculature. No adenopathy in
the abdomen.

Other:  None.

Musculoskeletal: No suspicious bone lesions identified.
IMPRESSION: 1. Area in the central liver is compatible with focal fat with
geographic features in the porta hepatis. No focal, suspicious
hepatic lesion.
2. Post cholecystectomy without biliary duct dilation.

## 2022-03-30 ENCOUNTER — Other Ambulatory Visit: Payer: Self-pay | Admitting: Internal Medicine

## 2022-05-01 ENCOUNTER — Other Ambulatory Visit: Payer: Self-pay | Admitting: Internal Medicine

## 2022-05-04 ENCOUNTER — Other Ambulatory Visit: Payer: Self-pay | Admitting: Internal Medicine

## 2022-05-04 ENCOUNTER — Other Ambulatory Visit (HOSPITAL_COMMUNITY): Payer: Self-pay

## 2022-05-04 DIAGNOSIS — K2100 Gastro-esophageal reflux disease with esophagitis, without bleeding: Secondary | ICD-10-CM

## 2022-05-04 DIAGNOSIS — E039 Hypothyroidism, unspecified: Secondary | ICD-10-CM

## 2022-05-05 ENCOUNTER — Other Ambulatory Visit: Payer: Self-pay | Admitting: Internal Medicine

## 2022-05-11 ENCOUNTER — Other Ambulatory Visit (HOSPITAL_COMMUNITY): Payer: Self-pay

## 2022-05-13 ENCOUNTER — Ambulatory Visit: Payer: BC Managed Care – PPO | Admitting: Internal Medicine

## 2022-05-13 ENCOUNTER — Other Ambulatory Visit (HOSPITAL_COMMUNITY): Payer: Self-pay

## 2022-05-13 ENCOUNTER — Other Ambulatory Visit: Payer: Self-pay | Admitting: Internal Medicine

## 2022-05-13 ENCOUNTER — Encounter: Payer: Self-pay | Admitting: Internal Medicine

## 2022-05-13 VITALS — BP 124/80 | HR 55 | Temp 98.3°F | Ht 64.0 in | Wt 156.0 lb

## 2022-05-13 DIAGNOSIS — E611 Iron deficiency: Secondary | ICD-10-CM | POA: Diagnosis not present

## 2022-05-13 DIAGNOSIS — E7800 Pure hypercholesterolemia, unspecified: Secondary | ICD-10-CM | POA: Diagnosis not present

## 2022-05-13 DIAGNOSIS — M797 Fibromyalgia: Secondary | ICD-10-CM

## 2022-05-13 DIAGNOSIS — R739 Hyperglycemia, unspecified: Secondary | ICD-10-CM

## 2022-05-13 DIAGNOSIS — Z0001 Encounter for general adult medical examination with abnormal findings: Secondary | ICD-10-CM

## 2022-05-13 DIAGNOSIS — E559 Vitamin D deficiency, unspecified: Secondary | ICD-10-CM | POA: Diagnosis not present

## 2022-05-13 DIAGNOSIS — E538 Deficiency of other specified B group vitamins: Secondary | ICD-10-CM | POA: Diagnosis not present

## 2022-05-13 DIAGNOSIS — E039 Hypothyroidism, unspecified: Secondary | ICD-10-CM | POA: Diagnosis not present

## 2022-05-13 DIAGNOSIS — F32A Depression, unspecified: Secondary | ICD-10-CM

## 2022-05-13 LAB — LIPID PANEL
Cholesterol: 264 mg/dL — ABNORMAL HIGH (ref 0–200)
HDL: 101.1 mg/dL (ref >39.00)
LDL Cholesterol: 147 mg/dL — ABNORMAL HIGH (ref 0–99)
NonHDL: 163.21
Total CHOL/HDL Ratio: 3
Triglycerides: 79 mg/dL (ref 0.0–149.0)
VLDL: 15.8 mg/dL (ref 0.0–40.0)

## 2022-05-13 LAB — URINALYSIS, ROUTINE W REFLEX MICROSCOPIC
Bilirubin Urine: NEGATIVE
Hgb urine dipstick: NEGATIVE
Ketones, ur: NEGATIVE
Nitrite: NEGATIVE
Specific Gravity, Urine: 1.025 (ref 1.000–1.030)
Total Protein, Urine: NEGATIVE
Urine Glucose: NEGATIVE
Urobilinogen, UA: 0.2 (ref 0.0–1.0)
pH: 6 (ref 5.0–8.0)

## 2022-05-13 LAB — CBC WITH DIFFERENTIAL/PLATELET
Basophils Absolute: 0.1 10*3/uL (ref 0.0–0.1)
Basophils Relative: 3.6 % — ABNORMAL HIGH (ref 0.0–3.0)
Eosinophils Absolute: 0.1 10*3/uL (ref 0.0–0.7)
Eosinophils Relative: 2.4 % (ref 0.0–5.0)
HCT: 39.7 % (ref 36.0–46.0)
Hemoglobin: 13.7 g/dL (ref 12.0–15.0)
Lymphocytes Relative: 33.6 % (ref 12.0–46.0)
Lymphs Abs: 1.3 10*3/uL (ref 0.7–4.0)
MCHC: 34.5 g/dL (ref 30.0–36.0)
MCV: 93.2 fl (ref 78.0–100.0)
Monocytes Absolute: 0.4 10*3/uL (ref 0.1–1.0)
Monocytes Relative: 10.5 % (ref 3.0–12.0)
Neutro Abs: 2 10*3/uL (ref 1.4–7.7)
Neutrophils Relative %: 49.9 % (ref 43.0–77.0)
Platelets: 355 10*3/uL (ref 150.0–400.0)
RBC: 4.25 Mil/uL (ref 3.87–5.11)
RDW: 13.2 % (ref 11.5–15.5)
WBC: 3.9 10*3/uL — ABNORMAL LOW (ref 4.0–10.5)

## 2022-05-13 LAB — HEMOGLOBIN A1C: Hgb A1c MFr Bld: 5.6 % (ref 4.6–6.5)

## 2022-05-13 LAB — IBC PANEL
Iron: 52 ug/dL (ref 42–145)
Saturation Ratios: 15.1 % — ABNORMAL LOW (ref 20.0–50.0)
TIBC: 344.4 ug/dL (ref 250.0–450.0)
Transferrin: 246 mg/dL (ref 212.0–360.0)

## 2022-05-13 LAB — BASIC METABOLIC PANEL
BUN: 13 mg/dL (ref 6–23)
CO2: 29 mEq/L (ref 19–32)
Calcium: 8.9 mg/dL (ref 8.4–10.5)
Chloride: 105 mEq/L (ref 96–112)
Creatinine, Ser: 0.74 mg/dL (ref 0.40–1.20)
GFR: 86.7 mL/min (ref >60.00)
Glucose, Bld: 91 mg/dL (ref 70–99)
Potassium: 4.1 mEq/L (ref 3.5–5.1)
Sodium: 142 mEq/L (ref 135–145)

## 2022-05-13 LAB — MICROALBUMIN / CREATININE URINE RATIO
Creatinine,U: 137.7 mg/dL
Microalb Creat Ratio: 0.6 mg/g (ref 0.0–30.0)
Microalb, Ur: 0.9 mg/dL (ref 0.0–1.9)

## 2022-05-13 LAB — HEPATIC FUNCTION PANEL
ALT: 11 U/L (ref 0–35)
AST: 15 U/L (ref 0–37)
Albumin: 4.2 g/dL (ref 3.5–5.2)
Alkaline Phosphatase: 82 U/L (ref 39–117)
Bilirubin, Direct: 0.1 mg/dL (ref 0.0–0.3)
Total Bilirubin: 0.3 mg/dL (ref 0.2–1.2)
Total Protein: 6.5 g/dL (ref 6.0–8.3)

## 2022-05-13 LAB — VITAMIN B12: Vitamin B-12: >1500 pg/mL — ABNORMAL HIGH (ref 211–911)

## 2022-05-13 LAB — TSH: TSH: 1.57 u[IU]/mL (ref 0.35–5.50)

## 2022-05-13 LAB — FERRITIN: Ferritin: 11.2 ng/mL (ref 10.0–291.0)

## 2022-05-13 LAB — VITAMIN D 25 HYDROXY (VIT D DEFICIENCY, FRACTURES): VITD: 48.32 ng/mL (ref 30.00–100.00)

## 2022-05-13 MED ORDER — ROSUVASTATIN CALCIUM 10 MG PO TABS
10.0000 mg | ORAL_TABLET | Freq: Every day | ORAL | 3 refills | Status: DC
Start: 2022-05-13 — End: 2023-05-19

## 2022-05-13 NOTE — Progress Notes (Signed)
Patient ID: Kelli Bell, female   DOB: 01/07/1961, 62 y.o.   MRN: [id number]         Chief Complaint:: wellness exam and depression, hyperglycemia, hld, low thyroid, iron deficiency, low vit d       HPI:  Kelli Bell is a 62 y.o. female here for wellness exam; declines covid booster, o/w up to date                        Also increased stress recently with mother with dm and only 79 yo older than her, mother just now starting to have memory issue, 2 aunts recently died with dementia as well.  Denies worsening depressive symptoms, suicidal ideation, or panic; has ongoing anxiety, now increased recently.  FMS has been mildly worse, but stays busy with grandkids sports, has been able to lose wt on her own, tyring to get to 145 lbs.  Pt denies chest pain, increased sob or doe, wheezing, orthopnea, PND, increased LE swelling, palpitations, dizziness or syncope.   Pt denies polydipsia, polyuria, or new focal neuro s/s.    Pt denies fever, night sweats, loss of appetite, or other constitutional symptoms    Wt Readings from Last 3 Encounters:  05/13/22 156 lb (70.8 kg)  01/07/21 196 lb 6.4 oz (89.1 kg)  12/24/20 191 lb (86.6 kg)   BP Readings from Last 3 Encounters:  05/13/22 124/80  01/07/21 118/72  12/24/20 116/71   Immunization History  Administered Date(s) Administered   H1N1 11/28/2007   Influenza Whole 11/29/2006, 11/28/2007, 10/26/2008   Influenza, Seasonal, Injecte, Preservative Fre 11/10/2012, 11/27/2019   Influenza,inj,Quad PF,6+ Mos 11/03/2016, 11/09/2017, 10/04/2018   Influenza-Unspecified 11/19/2011, 11/02/2013, 11/27/2019   PFIZER(Purple Top)SARS-COV-2 Vaccination 05/05/2019, 05/29/2019, 02/26/2020   Td 02/23/2008   Tdap 12/20/2019   Zoster Recombinat (Shingrix) 12/20/2019, 02/19/2020  There are no preventive care reminders to display for this patient.    Past Medical History:  Diagnosis Date   ANXIETY 03/30/2007   BACK PAIN 02/20/2009   Essential hypertension, benign 05/31/2012    Family history of anesthesia complication    "mother has hard time waking up"   GERD 01/13/2007   H/O hiatal hernia    HYPERLIPIDEMIA 02/22/2008   HYPOTHYROIDISM 03/30/2007   MENOPAUSAL DISORDER 02/23/2008   Nocturia    OBSTRUCTIVE SLEEP APNEA 06/29/2007   has not used c pap x 8 months due to wt loss   Osteoarthritis    SINUSITIS- ACUTE-NOS 03/30/2007   Sleep apnea    THORACIC/LUMBOSACRAL NEURITIS/RADICULITIS UNSPEC 02/23/2008   Wheezing 01/08/2009   Past Surgical History:  Procedure Laterality Date   ABDOMINAL HYSTERECTOMY  2004   with BSO   BREATH TEK H PYLORI N/A 05/05/2013   Procedure: BREATH TEK H PYLORI;  Surgeon: Kandis Cocking, MD;  Location: [facility];  Service: General;  Laterality: N/A;   CHOLECYSTECTOMY  1980   GASTRIC ROUX-EN-Y N/A 09/12/2013   Procedure: LAPAROSCOPIC ROUX-EN-Y GASTRIC BYPASS WITH UPPER ENDOSCOPY WITH ENTEROLYSIS OF ADHESIONS;  Surgeon: Kandis Cocking, MD;  Location: [facility];  Service: General;  Laterality: N/A;   jaw bone surgury     62 yo   SHOULDER ARTHROSCOPY WITH ROTATOR CUFF REPAIR AND SUBACROMIAL DECOMPRESSION Right 01/02/2020   Procedure: SHOULDER ARTHROSCOPY WITH ROTATOR CUFF REPAIR , SUBACROMIAL DECOMPRESSION,DISTAL CLAVICLE EXICISION;  Surgeon: Jones Broom, MD;  Location: [facility];  Service: Orthopedics;  Laterality: Right;   TONSILLECTOMY      reports that she quit smoking about  24 years ago. Her smoking use included cigarettes. She has never used smokeless tobacco. She reports current alcohol use. She reports that she does not use drugs. family history includes Arthritis in an other family member; Asthma in her brother; Breast cancer in her maternal aunt; Cancer in her mother; Colon cancer in an other family member; Colon polyps (age of onset: 70) in her father; Diabetes in her mother and paternal grandmother; Heart disease in her paternal grandmother; Hypertension in her mother; Lymphoma in her maternal grandmother;  Osteoporosis in an other family member; Stroke in her mother; Thyroid disease in her maternal grandmother and mother. Allergies  Allergen Reactions   Codeine Nausea Only   Feraheme [Ferumoxytol] Other (See Comments)    Dizzy, almost passed out, very tired and slept for hours after   Current Outpatient Medications on File Prior to Visit  Medication Sig Dispense Refill   CALCIUM PO Take 5 mLs by mouth daily.     carbamazepine (TEGRETOL XR) 200 MG 12 hr tablet TAKE 1 TABLET BY MOUTH TWICE A DAY 60 tablet 0   celecoxib (CELEBREX) 200 MG capsule Take 1 capsule (200 mg total) by mouth 2 (two) times daily as needed. 180 capsule 2   Cyanocobalamin (B-12 PO) Take 5 mLs by mouth as needed (low b12).     escitalopram (LEXAPRO) 10 MG tablet TAKE 1 TABLET BY MOUTH ONCE DAILY ALONG WITH A  TABLET TO EQUAL A DAILY DOSE OF  90 tablet 4   escitalopram (LEXAPRO) 20 MG tablet TAKE 1 TABLET BY MOUTH ONCE DAILY ALONG WITH  TABLET TO EQUAL A DOSE OF  90 tablet 4   gabapentin (NEURONTIN) 100 MG capsule Take 2 capsules (200 mg total) by mouth at bedtime. 180 capsule 0   Multiple Vitamins-Minerals (MULTIVITAMIN ADULT PO) Take 1 tablet by mouth daily.     topiramate (TOPAMAX) 50 MG tablet TAKE 1 TABLET BY MOUTH ONCE A DAY 90 tablet 0   traMADol (ULTRAM) 50 MG tablet Take 1 tablet (50 mg total) by mouth every 6 (six) hours as needed. 60 tablet 3   allopurinol (ZYLOPRIM) 100 MG tablet Take 1 tablet (100 mg total) by mouth 2 (two) times daily. 180 tablet 3   ferumoxytol (FERAHEME) 510 MG/17ML SOLN injection Inject 17 mLs (510 mg total) into the vein every 7 days for 2 doses. 34 mL 0   levothyroxine (SYNTHROID) 88 MCG tablet TAKE 1 TABLET BY MOUTH EVERY MORNING 90 tablet 3   Current Facility-Administered Medications on File Prior to Visit  Medication Dose Route Frequency Provider Last Rate Last Admin   iron dextran complex (INFED) 25 mg in sodium chloride 0.9 % 500 mL IVPB  25 mg Intravenous Once  Stechschulte, Hyman Hopes, MD       Followed by   iron dextran complex (INFED) 1,000 mg in sodium chloride 0.9 % 500 mL IVPB  1,000 mg Intravenous Once Stechschulte, Hyman Hopes, MD            ROS:  All others reviewed and negative.  Objective        PE:  BP 124/80 (BP Location: Right Arm, Patient Position: Sitting, Cuff Size: Normal)   Pulse (!) 55   Temp 98.3 F (36.8 C) (Oral)   Ht  (1.626 m)   Wt 156 lb (70.8 kg)   SpO2 98%   BMI 26.78 kg/m                 Constitutional: Pt appears in NAD  HENT: Head: NCAT.                Right Ear: External ear normal.                 Left Ear: External ear normal.                Eyes: . Pupils are equal, round, and reactive to light. Conjunctivae and EOM are normal               Nose: without d/c or deformity               Neck: Neck supple. Gross normal ROM               Cardiovascular: Normal rate and regular rhythm.                 Pulmonary/Chest: Effort normal and breath sounds without rales or wheezing.                Abd:  Soft, NT, ND, + BS, no organomegaly               Neurological: Pt is alert. At baseline orientation, motor grossly intact               Skin: Skin is warm. No rashes, no other new lesions, LE edema - none               Psychiatric: Pt behavior is normal without agitation   Micro: none  Cardiac tracings I have personally interpreted today:  none  Pertinent Radiological findings (summarize): none   Lab Results  Component Value Date   WBC 3.9 (L) 05/13/2022   HGB 13.7 05/13/2022   HCT 39.7 05/13/2022   PLT 355.0 05/13/2022   GLUCOSE 91 05/13/2022   CHOL 264 (H) 05/13/2022   TRIG 79.0 05/13/2022   HDL 101.10 05/13/2022   LDLDIRECT 94.8 04/29/2012   LDLCALC 147 (H) 05/13/2022   ALT 11 05/13/2022   AST 15 05/13/2022   NA 142 05/13/2022   K 4.1 05/13/2022   CL 105 05/13/2022   CREATININE 0.74 05/13/2022   BUN 13 05/13/2022   CO2 29 05/13/2022   TSH 1.57 05/13/2022   INR 0.9 04/23/2020    HGBA1C 5.6 05/13/2022   MICROALBUR 0.9 05/13/2022   Assessment/Plan:  Cariah Salatino is a 62 y.o. White or Caucasian [1] female with  has a past medical history of ANXIETY (03/30/2007), BACK PAIN (02/20/2009), Essential hypertension, benign (05/31/2012), Family history of anesthesia complication, GERD (01/13/2007), H/O hiatal hernia, HYPERLIPIDEMIA (02/22/2008), HYPOTHYROIDISM (03/30/2007), MENOPAUSAL DISORDER (02/23/2008), Nocturia, OBSTRUCTIVE SLEEP APNEA (06/29/2007), Osteoarthritis, SINUSITIS- ACUTE-NOS (03/30/2007), Sleep apnea, THORACIC/LUMBOSACRAL NEURITIS/RADICULITIS UNSPEC (02/23/2008), and Wheezing (01/08/2009).  Encounter for well adult exam with abnormal findings Age and sex appropriate education and counseling updated with regular exercise and diet Referrals for preventative services - none needed Immunizations addressed - declines covid booster Smoking counseling  - none needed Evidence for depression or other mood disorder - chornic anxiety depression stable Most recent labs reviewed. I have personally reviewed and have noted: 1) the patient's medical and social history 2) The patient's current medications and supplements 3) The patient's height, weight, and BMI have been recorded in the chart   Depression Stable overall, declines need for change in tx or counseling referral  Hyperglycemia Lab Results  Component Value Date   HGBA1C 5.6 05/13/2022   Stable, pt to continue current medical treatment  - diet, wt control   Hyperlipidemia  Lab Results  Component Value Date   LDLCALC 147 (H) 05/13/2022   Uncontrolled, pt to start crestor 10 mg qd   Hypothyroidism Lab Results  Component Value Date   TSH 1.57 05/13/2022   Stable, pt to continue levothyroxine 88 mcg qd  Iron deficiency For f/u lab, no recent overt bleeding or bruising  Vitamin D deficiency Last vitamin D Lab Results  Component Value Date   VD25OH 48.32 05/13/2022   Stable, cont oral replacement  Followup:  Return in about 1 year (around 05/13/2023).  Oliver Barre, MD 05/16/2022 3:54 PM [facility]

## 2022-05-13 NOTE — Patient Instructions (Signed)

## 2022-05-14 ENCOUNTER — Other Ambulatory Visit: Payer: Self-pay | Admitting: Internal Medicine

## 2022-05-14 ENCOUNTER — Other Ambulatory Visit (HOSPITAL_COMMUNITY): Payer: Self-pay

## 2022-05-14 DIAGNOSIS — K2100 Gastro-esophageal reflux disease with esophagitis, without bleeding: Secondary | ICD-10-CM

## 2022-05-14 MED ORDER — OMEPRAZOLE 20 MG PO CPDR
40.0000 mg | DELAYED_RELEASE_CAPSULE | Freq: Every day | ORAL | 0 refills | Status: DC
  Filled 2022-05-14: qty 60, 30d supply, fill #0

## 2022-05-14 NOTE — Telephone Encounter (Signed)
Patient is going out of town on "Saturday

## 2022-05-15 ENCOUNTER — Other Ambulatory Visit: Payer: Self-pay | Admitting: Internal Medicine

## 2022-05-15 ENCOUNTER — Other Ambulatory Visit (HOSPITAL_COMMUNITY): Payer: Self-pay

## 2022-05-16 ENCOUNTER — Other Ambulatory Visit (HOSPITAL_COMMUNITY): Payer: Self-pay

## 2022-05-16 ENCOUNTER — Encounter: Payer: Self-pay | Admitting: Internal Medicine

## 2022-05-16 NOTE — Assessment & Plan Note (Signed)
Lab Results  Component Value Date   TSH 1.57 05/13/2022   Stable, pt to continue levothyroxine 88 mcg qd

## 2022-05-16 NOTE — Assessment & Plan Note (Signed)
Stable overall, declines need for change in tx or counseling referral 

## 2022-05-16 NOTE — Assessment & Plan Note (Signed)
Lab Results  Component Value Date   LDLCALC 147 (H) 05/13/2022   Uncontrolled, pt to start crestor 10 mg qd

## 2022-05-16 NOTE — Assessment & Plan Note (Signed)
Last vitamin D Lab Results  Component Value Date   VD25OH 48.32 05/13/2022   Stable, cont oral replacement

## 2022-05-16 NOTE — Assessment & Plan Note (Signed)
Lab Results  Component Value Date   HGBA1C 5.6 05/13/2022   Stable, pt to continue current medical treatment  - diet, wt control  

## 2022-05-16 NOTE — Assessment & Plan Note (Signed)
For f/u lab, no recent overt bleeding or bruising

## 2022-05-16 NOTE — Assessment & Plan Note (Signed)
Age and sex appropriate education and counseling updated with regular exercise and diet Referrals for preventative services - none needed Immunizations addressed - declines covid booster Smoking counseling  - none needed Evidence for depression or other mood disorder - chornic anxiety depression stable Most recent labs reviewed. I have personally reviewed and have noted: 1) the patient's medical and social history 2) The patient's current medications and supplements 3) The patient's height, weight, and BMI have been recorded in the chart

## 2022-05-18 ENCOUNTER — Other Ambulatory Visit (HOSPITAL_COMMUNITY): Payer: Self-pay

## 2022-05-18 ENCOUNTER — Other Ambulatory Visit: Payer: Self-pay | Admitting: Internal Medicine

## 2022-05-18 ENCOUNTER — Telehealth: Payer: Self-pay | Admitting: Internal Medicine

## 2022-05-18 DIAGNOSIS — E039 Hypothyroidism, unspecified: Secondary | ICD-10-CM

## 2022-05-18 MED ORDER — GABAPENTIN 100 MG PO CAPS
200.0000 mg | ORAL_CAPSULE | Freq: Every day | ORAL | 1 refills | Status: DC
  Filled 2022-05-18: qty 180, 90d supply, fill #0
  Filled 2022-08-25 (×2): qty 180, 90d supply, fill #1

## 2022-05-18 MED ORDER — ALLOPURINOL 100 MG PO TABS
100.0000 mg | ORAL_TABLET | Freq: Two times a day (BID) | ORAL | 3 refills | Status: DC
  Filled 2022-05-18: qty 101, 50d supply, fill #0
  Filled 2022-05-18: qty 180, 90d supply, fill #0
  Filled 2022-05-18: qty 79, 40d supply, fill #0
  Filled 2022-08-25 (×2): qty 180, 90d supply, fill #1
  Filled 2022-11-24: qty 180, 90d supply, fill #2
  Filled 2023-02-27: qty 180, 90d supply, fill #3

## 2022-05-18 MED ORDER — TOPIRAMATE 50 MG PO TABS
ORAL_TABLET | Freq: Every day | ORAL | 1 refills | Status: DC
  Filled 2022-05-18: qty 90, 90d supply, fill #0
  Filled 2022-08-25 (×2): qty 90, 90d supply, fill #1

## 2022-05-18 MED ORDER — LEVOTHYROXINE SODIUM 88 MCG PO TABS
ORAL_TABLET | Freq: Every morning | ORAL | 3 refills | Status: DC
Start: 2022-05-18 — End: 2023-08-20
  Filled 2022-05-18: qty 90, 90d supply, fill #0
  Filled 2022-08-25 (×2): qty 90, 90d supply, fill #1
  Filled 2022-11-24: qty 90, 90d supply, fill #2
  Filled 2023-02-27: qty 90, 90d supply, fill #3

## 2022-05-18 NOTE — Telephone Encounter (Signed)
Patient was in and saw Dr. Jonny Ruiz on 4/17 - states that her refills of the following medications have not been sent to pharmacy and she was due to go out of town for a month on Saturday. Trip has been postponed until she can get her medications. Please call patient when medications have been sent to pharmacy at [phone]:    allopurinol (ZYLOPRIM) 100 MG tablet (Expired)  gabapentin (NEURONTIN) 100 MG capsule    levothyroxine (SYNTHROID) 88 MCG tablet (Expired   topiramate (TOPAMAX) 50 MG tablet  Please send asap to [facility]

## 2022-05-18 NOTE — Telephone Encounter (Signed)
Refills has been sent to [facility].Marland KitchenRaechel Chute

## 2022-05-21 ENCOUNTER — Other Ambulatory Visit (HOSPITAL_COMMUNITY): Payer: Self-pay

## 2022-06-15 ENCOUNTER — Encounter: Payer: Self-pay | Admitting: Internal Medicine

## 2022-06-15 ENCOUNTER — Other Ambulatory Visit (HOSPITAL_COMMUNITY): Payer: Self-pay

## 2022-06-15 ENCOUNTER — Ambulatory Visit: Payer: BC Managed Care – PPO | Admitting: Internal Medicine

## 2022-06-15 VITALS — BP 124/82 | HR 63 | Temp 98.1°F | Ht 64.0 in | Wt 155.0 lb

## 2022-06-15 DIAGNOSIS — R051 Acute cough: Secondary | ICD-10-CM

## 2022-06-15 DIAGNOSIS — R059 Cough, unspecified: Secondary | ICD-10-CM | POA: Insufficient documentation

## 2022-06-15 DIAGNOSIS — R6889 Other general symptoms and signs: Secondary | ICD-10-CM | POA: Diagnosis not present

## 2022-06-15 DIAGNOSIS — E559 Vitamin D deficiency, unspecified: Secondary | ICD-10-CM | POA: Diagnosis not present

## 2022-06-15 DIAGNOSIS — R062 Wheezing: Secondary | ICD-10-CM | POA: Insufficient documentation

## 2022-06-15 DIAGNOSIS — R739 Hyperglycemia, unspecified: Secondary | ICD-10-CM

## 2022-06-15 LAB — POC COVID19 BINAXNOW: SARS Coronavirus 2 Ag: NEGATIVE

## 2022-06-15 MED ORDER — DOXYCYCLINE HYCLATE 100 MG PO TABS
100.0000 mg | ORAL_TABLET | Freq: Two times a day (BID) | ORAL | 0 refills | Status: DC
  Filled 2022-06-15: qty 20, 10d supply, fill #0

## 2022-06-15 MED ORDER — HYDROCODONE BIT-HOMATROP MBR 5-1.5 MG/5ML PO SOLN
5.0000 mL | Freq: Four times a day (QID) | ORAL | 0 refills | Status: AC | PRN
  Filled 2022-06-15: qty 180, 9d supply, fill #0

## 2022-06-15 MED ORDER — METHYLPREDNISOLONE ACETATE 80 MG/ML IJ SUSP
80.0000 mg | Freq: Once | INTRAMUSCULAR | Status: AC
Start: 2022-06-15 — End: 2022-06-15
  Administered 2022-06-15: 80 mg via INTRAMUSCULAR

## 2022-06-15 NOTE — Assessment & Plan Note (Signed)
Lab Results  Component Value Date   HGBA1C 5.6 05/13/2022   Stable, pt to continue current medical treatment  - diet, wt control  

## 2022-06-15 NOTE — Assessment & Plan Note (Signed)
Mild, for depomedrol IM 80mg  x 1,  to f/u any worsening symptoms or concerns

## 2022-06-15 NOTE — Patient Instructions (Addendum)
Your COVID testing was negative  You had the steroid shot today   Please take all new medication as prescribed - the antibiotic, and cough medicine as needed  Please continue all other medications as before, and refills have been done if requested.  Please have the pharmacy call with any other refills you may need  Please keep your appointments with your specialists as you may have planned  Please call for any worsening for a chest xray

## 2022-06-15 NOTE — Assessment & Plan Note (Signed)
Last vitamin D Lab Results  Component Value Date   VD25OH 48.32 05/13/2022   Stable, cont oral replacement  

## 2022-06-15 NOTE — Assessment & Plan Note (Signed)
Mild to mod, declines cxr but c/w bronchitis vs pna, for antibx course - doxycycline 100 bid, cough med prn,  to f/u any worsening symptoms or concerns

## 2022-06-15 NOTE — Progress Notes (Signed)
Patient ID: Kelli Bell, female   DOB: 1960-12-20, 62 y.o.   MRN: [id number]        Chief Complaint: follow up cough and wheezing for 1 wk, hyperglycemia, low vit d        HPI:  Kelli Bell is a 62 y.o. female Here with acute onset mild to mod 2-3 days ST, HA, general weakness and malaise, with prod cough greenish sputum, but Pt denies chest pain, increased sob or doe, wheezing, orthopnea, PND, increased LE swelling, palpitations, dizziness or syncope, except for onest mild wheezing, sob x 2 days.   Pt denies polydipsia, polyuria, or new focal neuro s/s.    Pt denies further recent wt loss, night sweats, loss of appetite, or other constitutional symptoms         Wt Readings from Last 3 Encounters:  06/15/22 155 lb (70.3 kg)  05/13/22 156 lb (70.8 kg)  01/07/21 196 lb 6.4 oz (89.1 kg)   BP Readings from Last 3 Encounters:  06/15/22 124/82  05/13/22 124/80  01/07/21 118/72         Past Medical History:  Diagnosis Date   ANXIETY 03/30/2007   BACK PAIN 02/20/2009   Essential hypertension, benign 05/31/2012   Family history of anesthesia complication    "mother has hard time waking up"   GERD 01/13/2007   H/O hiatal hernia    HYPERLIPIDEMIA 02/22/2008   HYPOTHYROIDISM 03/30/2007   MENOPAUSAL DISORDER 02/23/2008   Nocturia    OBSTRUCTIVE SLEEP APNEA 06/29/2007   has not used c pap x 8 months due to wt loss   Osteoarthritis    SINUSITIS- ACUTE-NOS 03/30/2007   Sleep apnea    THORACIC/LUMBOSACRAL NEURITIS/RADICULITIS UNSPEC 02/23/2008   Wheezing 01/08/2009   Past Surgical History:  Procedure Laterality Date   ABDOMINAL HYSTERECTOMY  2004   with BSO   BREATH TEK H PYLORI N/A 05/05/2013   Procedure: BREATH TEK H PYLORI;  Surgeon: Kandis Cocking, MD;  Location: [facility];  Service: General;  Laterality: N/A;   CHOLECYSTECTOMY  1980   GASTRIC ROUX-EN-Y N/A 09/12/2013   Procedure: LAPAROSCOPIC ROUX-EN-Y GASTRIC BYPASS WITH UPPER ENDOSCOPY WITH ENTEROLYSIS OF ADHESIONS;  Surgeon: Kandis Cocking,  MD;  Location: [facility];  Service: General;  Laterality: N/A;   jaw bone surgury     62 yo   SHOULDER ARTHROSCOPY WITH ROTATOR CUFF REPAIR AND SUBACROMIAL DECOMPRESSION Right 01/02/2020   Procedure: SHOULDER ARTHROSCOPY WITH ROTATOR CUFF REPAIR , SUBACROMIAL DECOMPRESSION,DISTAL CLAVICLE EXICISION;  Surgeon: Jones Broom, MD;  Location: [facility];  Service: Orthopedics;  Laterality: Right;   TONSILLECTOMY      reports that she quit smoking about 24 years ago. Her smoking use included cigarettes. She has never used smokeless tobacco. She reports current alcohol use. She reports that she does not use drugs. family history includes Arthritis in an other family member; Asthma in her brother; Breast cancer in her maternal aunt; Cancer in her mother; Colon cancer in an other family member; Colon polyps (age of onset: 23) in her father; Diabetes in her mother and paternal grandmother; Heart disease in her paternal grandmother; Hypertension in her mother; Lymphoma in her maternal grandmother; Osteoporosis in an other family member; Stroke in her mother; Thyroid disease in her maternal grandmother and mother. Allergies  Allergen Reactions   Codeine Nausea Only   Feraheme [Ferumoxytol] Other (See Comments)    Dizzy, almost passed out, very tired and slept for hours after   Current Outpatient Medications on File  Prior to Visit  Medication Sig Dispense Refill   allopurinol (ZYLOPRIM) 100 MG tablet Take 1 tablet (100 mg total) by mouth 2 (two) times daily. 180 tablet 3   CALCIUM PO Take 5 mLs by mouth daily.     carbamazepine (TEGRETOL XR) 200 MG 12 hr tablet TAKE 1 TABLET BY MOUTH TWICE A DAY 60 tablet 0   celecoxib (CELEBREX) 200 MG capsule Take 1 capsule (200 mg total) by mouth 2 (two) times daily as needed. 180 capsule 2   Cyanocobalamin (B-12 PO) Take 5 mLs by mouth as needed (low b12).     escitalopram (LEXAPRO) 10 MG tablet TAKE 1 TABLET BY MOUTH ONCE DAILY ALONG WITH A 20MG  TABLET  TO EQUAL A DAILY DOSE OF 30MG  90 tablet 4   escitalopram (LEXAPRO) 20 MG tablet TAKE 1 TABLET BY MOUTH ONCE DAILY ALONG WITH 10MG  TABLET TO EQUAL A DOSE OF 30MG  90 tablet 4   gabapentin (NEURONTIN) 100 MG capsule Take 2 capsules (200 mg total) by mouth at bedtime. 180 capsule 1   levothyroxine (SYNTHROID) 88 MCG tablet TAKE 1 TABLET BY MOUTH EVERY MORNING 90 tablet 3   Multiple Vitamins-Minerals (MULTIVITAMIN ADULT PO) Take 1 tablet by mouth daily.     omeprazole (PRILOSEC) 20 MG capsule Take 2 capsules (40 mg total) by mouth daily. Will need an appointment for future refills. 60 capsule 0   rosuvastatin (CRESTOR) 10 MG tablet Take 1 tablet (10 mg total) by mouth daily. 90 tablet 3   topiramate (TOPAMAX) 50 MG tablet TAKE 1 TABLET BY MOUTH ONCE A DAY 90 tablet 1   traMADol (ULTRAM) 50 MG tablet Take 1 tablet (50 mg total) by mouth every 6 (six) hours as needed. 60 tablet 3   ferumoxytol (FERAHEME) 510 MG/17ML SOLN injection Inject 17 mLs (510 mg total) into the vein every 7 days for 2 doses. 34 mL 0   Current Facility-Administered Medications on File Prior to Visit  Medication Dose Route Frequency Provider Last Rate Last Admin   iron dextran complex (INFED) 25 mg in sodium chloride 0.9 % 500 mL IVPB  25 mg Intravenous Once Stechschulte, Hyman Hopes, MD       Followed by   iron dextran complex (INFED) 1,000 mg in sodium chloride 0.9 % 500 mL IVPB  1,000 mg Intravenous Once Stechschulte, Hyman Hopes, MD            ROS:  All others reviewed and negative.  Objective        PE:  BP 124/82 (BP Location: Right Arm, Patient Position: Sitting, Cuff Size: Normal)   Pulse 63   Temp 98.1 F (36.7 C) (Oral)   Ht 5\' 4"  (1.626 m)   Wt 155 lb (70.3 kg)   SpO2 98%   BMI 26.61 kg/m                 Constitutional: Pt appears in NAD               HENT: Head: NCAT.                Right Ear: External ear normal.                 Left Ear: External ear normal. Bilat tm's with mild erythema.  Max sinus areas mild  tender.  Pharynx with mild erythema, no exudate               Eyes: . Pupils are equal, round, and reactive to  light. Conjunctivae and EOM are normal               Nose: without d/c or deformity               Neck: Neck supple. Gross normal ROM               Cardiovascular: Normal rate and regular rhythm.                 Pulmonary/Chest: Effort normal and breath sounds decresed without rales but few bilat wheezing.                               Neurological: Pt is alert. At baseline orientation, motor grossly intact               Skin: Skin is warm. No rashes, no other new lesions, LE edema - none               Psychiatric: Pt behavior is normal without agitation   Micro: none  Cardiac tracings I have personally interpreted today:  none  Pertinent Radiological findings (summarize): none   Lab Results  Component Value Date   WBC 3.9 (L) 05/13/2022   HGB 13.7 05/13/2022   HCT 39.7 05/13/2022   PLT 355.0 05/13/2022   GLUCOSE 91 05/13/2022   CHOL 264 (H) 05/13/2022   TRIG 79.0 05/13/2022   HDL 101.10 05/13/2022   LDLDIRECT 94.8 04/29/2012   LDLCALC 147 (H) 05/13/2022   ALT 11 05/13/2022   AST 15 05/13/2022   NA 142 05/13/2022   K 4.1 05/13/2022   CL 105 05/13/2022   CREATININE 0.74 05/13/2022   BUN 13 05/13/2022   CO2 29 05/13/2022   TSH 1.57 05/13/2022   INR 0.9 04/23/2020   HGBA1C 5.6 05/13/2022   MICROALBUR 0.9 05/13/2022   POCT - COVID - neg  Assessment/Plan:  Kelli Bell is a 62 y.o. White or Caucasian [1] female with  has a past medical history of ANXIETY (03/30/2007), BACK PAIN (02/20/2009), Essential hypertension, benign (05/31/2012), Family history of anesthesia complication, GERD (01/13/2007), H/O hiatal hernia, HYPERLIPIDEMIA (02/22/2008), HYPOTHYROIDISM (03/30/2007), MENOPAUSAL DISORDER (02/23/2008), Nocturia, OBSTRUCTIVE SLEEP APNEA (06/29/2007), Osteoarthritis, SINUSITIS- ACUTE-NOS (03/30/2007), Sleep apnea, THORACIC/LUMBOSACRAL NEURITIS/RADICULITIS UNSPEC (02/23/2008), and  Wheezing (01/08/2009).  Hyperglycemia Lab Results  Component Value Date   HGBA1C 5.6 05/13/2022   Stable, pt to continue current medical treatment  - diet, wt control   Vitamin D deficiency Last vitamin D Lab Results  Component Value Date   VD25OH 48.32 05/13/2022   Stable, cont oral replacement   Cough Mild to mod, declines cxr but c/w bronchitis vs pna, for antibx course - doxycycline 100 bid, cough med prn,  to f/u any worsening symptoms or concerns  Wheezing Mild, for depomedrol IM 80mg  x 1,  to f/u any worsening symptoms or concerns  Followup: Return if symptoms worsen or fail to improve.  Oliver Barre, MD 06/15/2022 4:41 PM [facility]

## 2022-06-25 ENCOUNTER — Telehealth: Payer: Self-pay

## 2022-06-25 MED ORDER — FLUCONAZOLE 150 MG PO TABS: ORAL_TABLET | ORAL | 1 refills | Status: DC

## 2022-06-25 NOTE — Telephone Encounter (Signed)
Ok [facility] 

## 2022-06-25 NOTE — Telephone Encounter (Signed)
Pt states she has now developed a yeast infection from the two abx she was by PCP for URI and sinus infection. Ptis asking that a rx for Diflucan be sent in to her pharmacy to tx the yeast infection.

## 2022-07-22 ENCOUNTER — Other Ambulatory Visit: Payer: Self-pay | Admitting: Nurse Practitioner

## 2022-07-22 DIAGNOSIS — Z1231 Encounter for screening mammogram for malignant neoplasm of breast: Secondary | ICD-10-CM

## 2022-07-25 ENCOUNTER — Other Ambulatory Visit (HOSPITAL_COMMUNITY): Payer: Self-pay

## 2022-07-25 ENCOUNTER — Other Ambulatory Visit: Payer: Self-pay | Admitting: Internal Medicine

## 2022-07-25 DIAGNOSIS — K2100 Gastro-esophageal reflux disease with esophagitis, without bleeding: Secondary | ICD-10-CM

## 2022-07-27 ENCOUNTER — Other Ambulatory Visit: Payer: Self-pay

## 2022-07-27 ENCOUNTER — Other Ambulatory Visit (HOSPITAL_COMMUNITY): Payer: Self-pay

## 2022-07-27 MED ORDER — OMEPRAZOLE 20 MG PO CPDR
40.0000 mg | DELAYED_RELEASE_CAPSULE | Freq: Every day | ORAL | 0 refills | Status: DC
Start: 2022-07-27 — End: 2022-08-20
  Filled 2022-07-27: qty 60, 30d supply, fill #0

## 2022-08-20 ENCOUNTER — Other Ambulatory Visit (HOSPITAL_COMMUNITY): Payer: Self-pay

## 2022-08-20 ENCOUNTER — Other Ambulatory Visit: Payer: Self-pay | Admitting: Internal Medicine

## 2022-08-20 ENCOUNTER — Other Ambulatory Visit: Payer: Self-pay

## 2022-08-20 DIAGNOSIS — K2100 Gastro-esophageal reflux disease with esophagitis, without bleeding: Secondary | ICD-10-CM

## 2022-08-20 MED ORDER — ESCITALOPRAM OXALATE 10 MG PO TABS
10.0000 mg | ORAL_TABLET | Freq: Every day | ORAL | 1 refills | Status: DC
  Filled 2022-08-20: qty 90, 90d supply, fill #0
  Filled 2022-11-24: qty 90, 90d supply, fill #1

## 2022-08-20 MED ORDER — ESCITALOPRAM OXALATE 20 MG PO TABS
20.0000 mg | ORAL_TABLET | Freq: Every day | ORAL | 1 refills | Status: DC
  Filled 2022-08-20: qty 90, 90d supply, fill #0
  Filled 2022-11-24: qty 90, 90d supply, fill #1

## 2022-08-20 MED ORDER — OMEPRAZOLE 20 MG PO CPDR
40.0000 mg | DELAYED_RELEASE_CAPSULE | Freq: Every day | ORAL | 0 refills | Status: AC
Start: 2022-08-20 — End: ?
  Filled 2022-08-25 (×2): qty 60, 30d supply, fill #0

## 2022-08-24 ENCOUNTER — Other Ambulatory Visit: Payer: Self-pay | Admitting: Internal Medicine

## 2022-08-24 ENCOUNTER — Telehealth: Payer: Self-pay | Admitting: Internal Medicine

## 2022-08-24 ENCOUNTER — Other Ambulatory Visit: Payer: Self-pay

## 2022-08-24 ENCOUNTER — Other Ambulatory Visit (HOSPITAL_COMMUNITY): Payer: Self-pay

## 2022-08-24 MED ORDER — CELECOXIB 200 MG PO CAPS
200.0000 mg | ORAL_CAPSULE | Freq: Two times a day (BID) | ORAL | 2 refills | Status: DC | PRN
  Filled 2022-08-24: qty 180, 90d supply, fill #0
  Filled 2022-12-27: qty 180, 90d supply, fill #1
  Filled 2023-02-27 – 2023-04-10 (×4): qty 180, 90d supply, fill #2

## 2022-08-24 NOTE — Telephone Encounter (Signed)
Pt has a year supply at the pharmacy already.

## 2022-08-24 NOTE — Telephone Encounter (Signed)
Prescription Request  08/24/2022  LOV: 06/15/2022 Pt is going out town of this week for a week and was wondering if she can get her Rx filled early please advise     What is the name of the medication or equipment? rosuvastatin (CRESTOR) 10 MG tablet   Have you contacted your pharmacy to request a refill? No   Which pharmacy would you like this sent to?    [id number] [place], [place] - [address] [place] [address] Phone: [phone] Fax: [phone]  Patient notified that their request is being sent to the clinical staff for review and that they should receive a response within 2 business days.   Please advise at Mobile [phone] (mobile)

## 2022-08-25 ENCOUNTER — Other Ambulatory Visit: Payer: Self-pay

## 2022-08-25 ENCOUNTER — Other Ambulatory Visit (HOSPITAL_COMMUNITY): Payer: Self-pay

## 2022-09-20 ENCOUNTER — Other Ambulatory Visit: Payer: Self-pay | Admitting: Internal Medicine

## 2022-09-21 ENCOUNTER — Other Ambulatory Visit: Payer: Self-pay

## 2022-10-08 ENCOUNTER — Other Ambulatory Visit (HOSPITAL_COMMUNITY): Payer: Self-pay

## 2022-10-28 ENCOUNTER — Other Ambulatory Visit: Payer: Self-pay | Admitting: Internal Medicine

## 2022-10-28 ENCOUNTER — Other Ambulatory Visit (HOSPITAL_COMMUNITY): Payer: Self-pay

## 2022-10-28 ENCOUNTER — Other Ambulatory Visit: Payer: Self-pay

## 2022-10-28 DIAGNOSIS — K2100 Gastro-esophageal reflux disease with esophagitis, without bleeding: Secondary | ICD-10-CM

## 2022-10-28 MED ORDER — OMEPRAZOLE 20 MG PO CPDR
40.0000 mg | DELAYED_RELEASE_CAPSULE | Freq: Every day | ORAL | 0 refills | Status: DC
Start: 2022-10-28 — End: 2022-11-04
  Filled 2022-10-28: qty 60, 30d supply, fill #0

## 2022-10-29 ENCOUNTER — Other Ambulatory Visit (HOSPITAL_COMMUNITY): Payer: Self-pay

## 2022-10-30 ENCOUNTER — Other Ambulatory Visit (HOSPITAL_COMMUNITY): Payer: Self-pay

## 2022-10-31 ENCOUNTER — Other Ambulatory Visit: Payer: Self-pay | Admitting: Internal Medicine

## 2022-11-02 ENCOUNTER — Other Ambulatory Visit: Payer: Self-pay

## 2022-11-04 ENCOUNTER — Other Ambulatory Visit (HOSPITAL_COMMUNITY): Payer: Self-pay

## 2022-11-04 ENCOUNTER — Other Ambulatory Visit: Payer: Self-pay

## 2022-11-04 ENCOUNTER — Telehealth: Payer: Self-pay | Admitting: Internal Medicine

## 2022-11-04 DIAGNOSIS — K2100 Gastro-esophageal reflux disease with esophagitis, without bleeding: Secondary | ICD-10-CM

## 2022-11-04 MED ORDER — OMEPRAZOLE 20 MG PO CPDR
40.0000 mg | DELAYED_RELEASE_CAPSULE | Freq: Every day | ORAL | 3 refills | Status: DC
Start: 2022-11-04 — End: 2023-05-19
  Filled 2022-11-04: qty 180, 90d supply, fill #0
  Filled 2022-11-24: qty 60, 30d supply, fill #0
  Filled 2023-02-27: qty 60, 30d supply, fill #1

## 2022-11-04 MED ORDER — CARBAMAZEPINE ER 200 MG PO TB12: 200.0000 mg | ORAL_TABLET | Freq: Two times a day (BID) | ORAL | 3 refills | Status: DC

## 2022-11-04 MED ORDER — OMEPRAZOLE 20 MG PO CPDR
40.0000 mg | DELAYED_RELEASE_CAPSULE | Freq: Every day | ORAL | 3 refills | Status: DC
Start: 2022-11-04 — End: 2022-11-04

## 2022-11-04 NOTE — Telephone Encounter (Signed)
Called and let Pt know a year supply has been sent.

## 2022-11-04 NOTE — Telephone Encounter (Signed)
Patient said she just picked up her omeprazole and was told to contact the office.She was told she needs to schedule an appointment for future refills. She was seen 06/15/2022 and is scheduled for 05/19/2023. She wanted to know why he wants her to come in again. She wasn't sure if he wanted her to start coming in every 6 months instead of every year, but she does not want to do that unless she has to. She said she is doing well and is healthy. Patient would like a call back at [phone].

## 2022-11-12 ENCOUNTER — Ambulatory Visit
Admission: RE | Admit: 2022-11-12 | Discharge: 2022-11-12 | Disposition: A | Discharge status: Home / Self Care | Payer: BC Managed Care – PPO | Source: Ambulatory Visit | Attending: Nurse Practitioner | Admitting: Nurse Practitioner

## 2022-11-12 DIAGNOSIS — Z1231 Encounter for screening mammogram for malignant neoplasm of breast: Secondary | ICD-10-CM

## 2022-11-17 ENCOUNTER — Encounter: Payer: Self-pay | Admitting: Nurse Practitioner

## 2022-11-24 ENCOUNTER — Other Ambulatory Visit: Payer: Self-pay | Admitting: Internal Medicine

## 2022-11-24 ENCOUNTER — Other Ambulatory Visit (HOSPITAL_COMMUNITY): Payer: Self-pay

## 2022-11-24 ENCOUNTER — Other Ambulatory Visit: Payer: Self-pay

## 2022-11-24 MED ORDER — GABAPENTIN 100 MG PO CAPS
200.0000 mg | ORAL_CAPSULE | Freq: Every day | ORAL | 1 refills | Status: DC
  Filled 2022-11-24: qty 180, 90d supply, fill #0
  Filled 2023-02-27: qty 180, 90d supply, fill #1

## 2022-11-24 MED ORDER — TOPIRAMATE 50 MG PO TABS
50.0000 mg | ORAL_TABLET | Freq: Every day | ORAL | 1 refills | Status: DC
  Filled 2022-11-24: qty 90, 90d supply, fill #0
  Filled 2023-02-27: qty 90, 90d supply, fill #1

## 2022-11-25 ENCOUNTER — Other Ambulatory Visit (HOSPITAL_COMMUNITY): Payer: Self-pay

## 2022-12-27 ENCOUNTER — Other Ambulatory Visit (HOSPITAL_COMMUNITY): Payer: Self-pay

## 2023-01-07 ENCOUNTER — Encounter: Payer: Self-pay | Admitting: Nurse Practitioner

## 2023-01-07 ENCOUNTER — Ambulatory Visit: Payer: BC Managed Care – PPO | Admitting: Nurse Practitioner

## 2023-01-07 VITALS — BP 122/70 | HR 68 | Ht 63.0 in | Wt 155.0 lb

## 2023-01-07 DIAGNOSIS — Z780 Asymptomatic menopausal state: Secondary | ICD-10-CM

## 2023-01-07 DIAGNOSIS — Z01419 Encounter for gynecological examination (general) (routine) without abnormal findings: Secondary | ICD-10-CM

## 2023-01-07 NOTE — Progress Notes (Signed)
Kelli Bell Sep 23, 1960 [id number]   History:  62 y.o. G2P2 presents as newly established patient for annual exam. Last seen in our office in 2021. S/P 2004 TAH BSO for endometriosis.  No HRT.  Normal Pap and mammogram history.  Primary care manages hypothyroidism, HTN, HLD, bone health. 2015 gastric bypass surgery. Doing well on Lexapro for anxiety/depression.  Gynecologic History No LMP recorded. Patient has had a hysterectomy.   Contraception: status post hysterectomy Sexually active: No  Health maintenance Last Pap: 05/12/2010 Last mammogram: 11/12/2022. Results were: Normal Last colonoscopy: 04/11/2020. Results were: SSP, 5-year recall Last Dexa: 2014. Results were: normal  Past medical history, past surgical history, family history and social history were all reviewed and documented in the EPIC chart. Widowed. Husband passed in 2017 from esophageal cancer, they were HS sweethearts. 2 children, live very close. 7 grandchildren. Caring for mother (just diagnosed with Alzheimer's) and father.   ROS:  A ROS was performed and pertinent positives and negatives are included.  Exam:  Vitals:   01/07/23 1123  BP: 122/70  Pulse: 68  SpO2: 100%  Weight: 155 lb (70.3 kg)  Height: 5\' 3"  (1.6 m)    Body mass index is 27.46 kg/m.  General appearance:  Normal Thyroid:  Symmetrical, normal in size, without palpable masses or nodularity. Respiratory  Auscultation:  Clear without wheezing or rhonchi Cardiovascular  Auscultation:  Regular rate, without rubs, murmurs or gallops  Edema/varicosities:  Not grossly evident Abdominal  Soft,nontender, without masses, guarding or rebound.  Liver/spleen:  No organomegaly noted  Hernia:  None appreciated  Skin  Inspection:  Grossly normal   Breasts: Examined lying and sitting.   Right: Without masses, retractions, discharge or axillary adenopathy.   Left: Without masses, retractions, discharge or axillary adenopathy. Pelvic: External  genitalia:  no lesions              Urethra:  normal appearing urethra with no masses, tenderness or lesions              Bartholins and Skenes: normal                 Vagina: normal appearing vagina with normal color and discharge, no lesions. Atrophic changes              Cervix: absent Bimanual Exam:  Uterus:  absent              Adnexa: no mass, fullness, tenderness              Rectovaginal: Deferred              Anus:  normal, no lesions  Patient informed chaperone available to be present for breast and pelvic exam. Patient has requested no chaperone to be present. Patient has been advised what will be completed during breast and pelvic exam.   Assessment/Plan:  62 y.o. G2P2 for annual exam.   Well female exam with routine gynecological exam - Education provided on SBEs, importance of preventative screenings, current guidelines, high calcium diet, regular exercise, and multivitamin daily. Labs with PCP.   Postmenopausal - no HRT. S/P 2004 TAH BSO for endometriosis  Screening for cervical cancer - No longer screening per guidelines.  Screening for breast cancer - Normal mammogram history.  Continue annual screenings.  Normal breast exam today.  Screening for colon cancer - 03/2020 colonoscopy. Will repeat at GI's recommended interval.   Screening for osteoporosis - Managed by PCP. Normal bone density in 2014. Will follow up  with PCP regarding.   Return in about 1 year (around 01/07/2024) for Annual.      [name] [facility], 11:44 AM 01/07/2023

## 2023-01-12 DIAGNOSIS — H2513 Age-related nuclear cataract, bilateral: Secondary | ICD-10-CM | POA: Diagnosis not present

## 2023-01-12 DIAGNOSIS — H18513 Endothelial corneal dystrophy, bilateral: Secondary | ICD-10-CM | POA: Diagnosis not present

## 2023-02-26 ENCOUNTER — Other Ambulatory Visit (HOSPITAL_COMMUNITY): Payer: Self-pay

## 2023-02-26 MED ORDER — PREDNISOLONE ACETATE 1 % OP SUSP
OPHTHALMIC | 0 refills | Status: DC
  Filled 2023-02-26: qty 10, 21d supply, fill #0

## 2023-02-27 ENCOUNTER — Other Ambulatory Visit (HOSPITAL_COMMUNITY): Payer: Self-pay

## 2023-03-01 ENCOUNTER — Other Ambulatory Visit: Payer: Self-pay

## 2023-03-01 ENCOUNTER — Other Ambulatory Visit (HOSPITAL_COMMUNITY): Payer: Self-pay

## 2023-03-01 MED ORDER — ESCITALOPRAM OXALATE 20 MG PO TABS
20.0000 mg | ORAL_TABLET | Freq: Every day | ORAL | 0 refills | Status: DC
  Filled 2023-03-01: qty 90, 90d supply, fill #0

## 2023-03-01 MED ORDER — ESCITALOPRAM OXALATE 10 MG PO TABS
10.0000 mg | ORAL_TABLET | Freq: Every day | ORAL | 0 refills | Status: DC
  Filled 2023-03-01: qty 90, 90d supply, fill #0

## 2023-03-02 ENCOUNTER — Other Ambulatory Visit (HOSPITAL_COMMUNITY): Payer: Self-pay

## 2023-03-03 ENCOUNTER — Other Ambulatory Visit (HOSPITAL_COMMUNITY): Payer: Self-pay

## 2023-03-03 DIAGNOSIS — E039 Hypothyroidism, unspecified: Secondary | ICD-10-CM | POA: Diagnosis not present

## 2023-03-03 DIAGNOSIS — Z87891 Personal history of nicotine dependence: Secondary | ICD-10-CM | POA: Diagnosis not present

## 2023-03-03 DIAGNOSIS — F418 Other specified anxiety disorders: Secondary | ICD-10-CM | POA: Diagnosis not present

## 2023-03-03 DIAGNOSIS — H2512 Age-related nuclear cataract, left eye: Secondary | ICD-10-CM | POA: Diagnosis not present

## 2023-03-03 MED ORDER — PREDNISOLONE ACETATE 1 % OP SUSP
OPHTHALMIC | 0 refills | Status: DC
  Filled 2023-03-03: qty 10, 67d supply, fill #0

## 2023-03-03 MED ORDER — PREDNISOLONE ACETATE 1 % OP SUSP
1.0000 [drp] | Freq: Three times a day (TID) | OPHTHALMIC | 0 refills | Status: DC
  Filled 2023-03-03: qty 10, 21d supply, fill #0

## 2023-03-04 ENCOUNTER — Other Ambulatory Visit (HOSPITAL_COMMUNITY): Payer: Self-pay

## 2023-03-17 ENCOUNTER — Other Ambulatory Visit (HOSPITAL_COMMUNITY): Payer: Self-pay

## 2023-04-01 ENCOUNTER — Other Ambulatory Visit (HOSPITAL_COMMUNITY): Payer: Self-pay

## 2023-04-01 DIAGNOSIS — F3342 Major depressive disorder, recurrent, in full remission: Secondary | ICD-10-CM | POA: Diagnosis not present

## 2023-04-01 DIAGNOSIS — F411 Generalized anxiety disorder: Secondary | ICD-10-CM | POA: Diagnosis not present

## 2023-04-10 ENCOUNTER — Other Ambulatory Visit (HOSPITAL_COMMUNITY): Payer: Self-pay

## 2023-04-12 ENCOUNTER — Other Ambulatory Visit (HOSPITAL_COMMUNITY): Payer: Self-pay

## 2023-05-19 ENCOUNTER — Encounter: Payer: Self-pay | Admitting: Internal Medicine

## 2023-05-19 ENCOUNTER — Other Ambulatory Visit (HOSPITAL_BASED_OUTPATIENT_CLINIC_OR_DEPARTMENT_OTHER): Payer: Self-pay

## 2023-05-19 ENCOUNTER — Ambulatory Visit: Payer: BC Managed Care – PPO | Admitting: Internal Medicine

## 2023-05-19 ENCOUNTER — Other Ambulatory Visit (HOSPITAL_COMMUNITY): Payer: Self-pay

## 2023-05-19 ENCOUNTER — Other Ambulatory Visit: Payer: Self-pay

## 2023-05-19 VITALS — BP 112/74 | HR 59 | Temp 98.0°F | Ht 63.0 in | Wt 149.0 lb

## 2023-05-19 DIAGNOSIS — E538 Deficiency of other specified B group vitamins: Secondary | ICD-10-CM | POA: Diagnosis not present

## 2023-05-19 DIAGNOSIS — Z0001 Encounter for general adult medical examination with abnormal findings: Secondary | ICD-10-CM

## 2023-05-19 DIAGNOSIS — E611 Iron deficiency: Secondary | ICD-10-CM | POA: Diagnosis not present

## 2023-05-19 DIAGNOSIS — M316 Other giant cell arteritis: Secondary | ICD-10-CM

## 2023-05-19 DIAGNOSIS — G2581 Restless legs syndrome: Secondary | ICD-10-CM | POA: Insufficient documentation

## 2023-05-19 DIAGNOSIS — K2100 Gastro-esophageal reflux disease with esophagitis, without bleeding: Secondary | ICD-10-CM | POA: Diagnosis not present

## 2023-05-19 DIAGNOSIS — Z0000 Encounter for general adult medical examination without abnormal findings: Secondary | ICD-10-CM

## 2023-05-19 DIAGNOSIS — R739 Hyperglycemia, unspecified: Secondary | ICD-10-CM | POA: Diagnosis not present

## 2023-05-19 DIAGNOSIS — E559 Vitamin D deficiency, unspecified: Secondary | ICD-10-CM

## 2023-05-19 DIAGNOSIS — E7800 Pure hypercholesterolemia, unspecified: Secondary | ICD-10-CM | POA: Diagnosis not present

## 2023-05-19 DIAGNOSIS — G500 Trigeminal neuralgia: Secondary | ICD-10-CM

## 2023-05-19 DIAGNOSIS — E039 Hypothyroidism, unspecified: Secondary | ICD-10-CM | POA: Diagnosis not present

## 2023-05-19 LAB — MICROALBUMIN / CREATININE URINE RATIO
Creatinine,U: 95.4 mg/dL
Microalb Creat Ratio: 15.5 mg/g (ref 0.0–30.0)
Microalb, Ur: 1.5 mg/dL (ref 0.0–1.9)

## 2023-05-19 LAB — SEDIMENTATION RATE: Sed Rate: 5 mm/h (ref 0–30)

## 2023-05-19 LAB — IBC PANEL
Iron: 49 ug/dL (ref 42–145)
Saturation Ratios: 13 % — ABNORMAL LOW (ref 20.0–50.0)
TIBC: 378 ug/dL (ref 250.0–450.0)
Transferrin: 270 mg/dL (ref 212.0–360.0)

## 2023-05-19 LAB — URINALYSIS, ROUTINE W REFLEX MICROSCOPIC
Bilirubin Urine: NEGATIVE
Hgb urine dipstick: NEGATIVE
Ketones, ur: NEGATIVE
Leukocytes,Ua: NEGATIVE
Nitrite: NEGATIVE
RBC / HPF: NONE SEEN (ref 0–?)
Specific Gravity, Urine: 1.01 (ref 1.000–1.030)
Total Protein, Urine: NEGATIVE
Urine Glucose: NEGATIVE
Urobilinogen, UA: 0.2 (ref 0.0–1.0)
pH: 6 (ref 5.0–8.0)

## 2023-05-19 LAB — LIPID PANEL
Cholesterol: 200 mg/dL (ref 0–200)
HDL: 100.8 mg/dL (ref >39.00)
LDL Cholesterol: 85 mg/dL (ref 0–99)
NonHDL: 99.44
Total CHOL/HDL Ratio: 2
Triglycerides: 70 mg/dL (ref 0.0–149.0)
VLDL: 14 mg/dL (ref 0.0–40.0)

## 2023-05-19 LAB — CBC WITH DIFFERENTIAL/PLATELET
Basophils Absolute: 0.1 10*3/uL (ref 0.0–0.1)
Basophils Relative: 2 % (ref 0.0–3.0)
Eosinophils Absolute: 0.1 10*3/uL (ref 0.0–0.7)
Eosinophils Relative: 2.8 % (ref 0.0–5.0)
HCT: 37 % (ref 36.0–46.0)
Hemoglobin: 12.4 g/dL (ref 12.0–15.0)
Lymphocytes Relative: 40.5 % (ref 12.0–46.0)
Lymphs Abs: 1.5 10*3/uL (ref 0.7–4.0)
MCHC: 33.6 g/dL (ref 30.0–36.0)
MCV: 92.1 fl (ref 78.0–100.0)
Monocytes Absolute: 0.4 10*3/uL (ref 0.1–1.0)
Monocytes Relative: 11.2 % (ref 3.0–12.0)
Neutro Abs: 1.6 10*3/uL (ref 1.4–7.7)
Neutrophils Relative %: 43.5 % (ref 43.0–77.0)
Platelets: 343 10*3/uL (ref 150.0–400.0)
RBC: 4.01 Mil/uL (ref 3.87–5.11)
RDW: 13.6 % (ref 11.5–15.5)
WBC: 3.6 10*3/uL — ABNORMAL LOW (ref 4.0–10.5)

## 2023-05-19 LAB — HEPATIC FUNCTION PANEL
ALT: 14 U/L (ref 0–35)
AST: 20 U/L (ref 0–37)
Albumin: 4.3 g/dL (ref 3.5–5.2)
Alkaline Phosphatase: 70 U/L (ref 39–117)
Bilirubin, Direct: 0 mg/dL (ref 0.0–0.3)
Total Bilirubin: 0.3 mg/dL (ref 0.2–1.2)
Total Protein: 6.6 g/dL (ref 6.0–8.3)

## 2023-05-19 LAB — BASIC METABOLIC PANEL WITH GFR
BUN: 9 mg/dL (ref 6–23)
CO2: 29 meq/L (ref 19–32)
Calcium: 8.9 mg/dL (ref 8.4–10.5)
Chloride: 105 meq/L (ref 96–112)
Creatinine, Ser: 0.65 mg/dL (ref 0.40–1.20)
GFR: 93.68 mL/min (ref >60.00)
Glucose, Bld: 85 mg/dL (ref 70–99)
Potassium: 4 meq/L (ref 3.5–5.1)
Sodium: 139 meq/L (ref 135–145)

## 2023-05-19 LAB — HEMOGLOBIN A1C: Hgb A1c MFr Bld: 5.8 % (ref 4.6–6.5)

## 2023-05-19 LAB — VITAMIN D 25 HYDROXY (VIT D DEFICIENCY, FRACTURES): VITD: 49.78 ng/mL (ref 30.00–100.00)

## 2023-05-19 LAB — VITAMIN B12: Vitamin B-12: 1057 pg/mL — ABNORMAL HIGH (ref 211–911)

## 2023-05-19 LAB — FERRITIN: Ferritin: 6.8 ng/mL — ABNORMAL LOW (ref 10.0–291.0)

## 2023-05-19 LAB — TSH: TSH: 0.64 u[IU]/mL (ref 0.35–5.50)

## 2023-05-19 MED ORDER — TOPIRAMATE 50 MG PO TABS
50.0000 mg | ORAL_TABLET | Freq: Every day | ORAL | 3 refills | Status: AC
  Filled 2023-05-19: qty 90, 90d supply, fill #0
  Filled 2023-11-11: qty 90, 90d supply, fill #1

## 2023-05-19 MED ORDER — ROSUVASTATIN CALCIUM 10 MG PO TABS
10.0000 mg | ORAL_TABLET | Freq: Every day | ORAL | 3 refills | Status: DC
  Filled 2023-05-19 (×2): qty 90, 90d supply, fill #0

## 2023-05-19 MED ORDER — CELECOXIB 200 MG PO CAPS
200.0000 mg | ORAL_CAPSULE | Freq: Two times a day (BID) | ORAL | 3 refills | Status: AC | PRN
  Filled 2023-05-19 – 2023-08-20 (×2): qty 180, 90d supply, fill #0
  Filled 2023-11-11: qty 180, 90d supply, fill #1

## 2023-05-19 MED ORDER — OMEPRAZOLE 20 MG PO CPDR
40.0000 mg | DELAYED_RELEASE_CAPSULE | Freq: Every day | ORAL | 3 refills | Status: AC
Start: 2023-05-19 — End: ?
  Filled 2023-05-19: qty 180, 90d supply, fill #0
  Filled 2023-08-20: qty 180, 90d supply, fill #1
  Filled 2023-11-11: qty 180, 90d supply, fill #2

## 2023-05-19 MED ORDER — CARBAMAZEPINE ER 200 MG PO TB12
200.0000 mg | ORAL_TABLET | Freq: Two times a day (BID) | ORAL | 3 refills | Status: DC
  Filled 2023-05-19: qty 180, 90d supply, fill #0

## 2023-05-19 MED ORDER — PRAMIPEXOLE DIHYDROCHLORIDE 0.5 MG PO TABS
0.5000 mg | ORAL_TABLET | Freq: Every evening | ORAL | 3 refills | Status: AC | PRN
  Filled 2023-05-19 (×2): qty 90, 90d supply, fill #0

## 2023-05-19 MED ORDER — ALLOPURINOL 100 MG PO TABS
100.0000 mg | ORAL_TABLET | Freq: Two times a day (BID) | ORAL | 3 refills | Status: AC
  Filled 2023-05-19: qty 180, 90d supply, fill #0
  Filled 2023-08-20: qty 180, 90d supply, fill #1
  Filled 2023-11-11: qty 180, 90d supply, fill #2

## 2023-05-19 NOTE — Assessment & Plan Note (Signed)
Last vitamin D Lab Results  Component Value Date   VD25OH 48.32 05/13/2022   Stable, cont oral replacement  

## 2023-05-19 NOTE — Assessment & Plan Note (Signed)
 No overt bleeding, for f/u iron  level with lab

## 2023-05-19 NOTE — Assessment & Plan Note (Signed)
 Stable, for tegretol  level

## 2023-05-19 NOTE — Assessment & Plan Note (Signed)
 Improved, for ESR with labs

## 2023-05-19 NOTE — Assessment & Plan Note (Signed)
Lab Results  Component Value Date   TSH 1.57 05/13/2022   Stable, pt to continue levothyroxine 88 mcg qd

## 2023-05-19 NOTE — Assessment & Plan Note (Signed)
Lab Results  Component Value Date   HGBA1C 5.6 05/13/2022   Stable, pt to continue current medical treatment  - diet, wt control  

## 2023-05-19 NOTE — Assessment & Plan Note (Signed)

## 2023-05-19 NOTE — Assessment & Plan Note (Signed)
 Lab Results  Component Value Date   LDLCALC 147 (H) 05/13/2022   Uncontrolled at  last lab, tolerating crestor  10 mg  well, for f/u lab today

## 2023-05-19 NOTE — Assessment & Plan Note (Signed)
 With recent worsening, for mirapex  0.5 mg at bedtime prn

## 2023-05-19 NOTE — Progress Notes (Signed)
 Patient ID: Kelli Bell, female   DOB: 02-Jan-1961, 63 y.o.   MRN: [id number]         Chief Complaint:: wellness exam and hld, RLS worsening, low thyroid , hyperglycemia, low vi td, iron  deficiency       HPI:  Kelli Bell is a 63 y.o. female here for wellness exam; up to date                Also Pt denies chest pain, increased sob or doe, wheezing, orthopnea, PND, increased LE swelling, palpitations, dizziness or syncope.   Pt denies polydipsia, polyuria, or new focal neuro s/s.    Pt denies fever, wt loss, night sweats, loss of appetite, or other constitutional symptoms  Does have worsening leg creepy crawly sensations most nights and has to walk to get rid of it when trying to get to sleep.  Already takes gabapentin  and tegretol .    Wt Readings from Last 3 Encounters:  05/19/23 149 lb (67.6 kg)  01/07/23 155 lb (70.3 kg)  06/15/22 155 lb (70.3 kg)   BP Readings from Last 3 Encounters:  05/19/23 112/74  01/07/23 122/70  06/15/22 124/82   Immunization History  Administered Date(s) Administered   H1N1 11/28/2007   Influenza Whole 11/29/2006, 11/28/2007, 10/26/2008   Influenza, Seasonal, Injecte, Preservative Fre 11/10/2012, 11/27/2019   Influenza,inj,Quad PF,6+ Mos 11/03/2016, 11/09/2017, 10/04/2018   Influenza-Unspecified 11/19/2011, 11/02/2013, 11/27/2019   PFIZER(Purple Top)SARS-COV-2 Vaccination 05/05/2019, 05/29/2019, 02/26/2020   Td 02/23/2008   Tdap 12/20/2019   Zoster Recombinant(Shingrix) 12/20/2019, 02/19/2020  There are no preventive care reminders to display for this patient.    Past Medical History:  Diagnosis Date   ANXIETY 03/30/2007   BACK PAIN 02/20/2009   Essential hypertension, benign 05/31/2012   Family history of anesthesia complication    "mother has hard time waking up"   GERD 01/13/2007   H/O hiatal hernia    HYPERLIPIDEMIA 02/22/2008   HYPOTHYROIDISM 03/30/2007   MENOPAUSAL DISORDER 02/23/2008   Nocturia    OBSTRUCTIVE SLEEP APNEA 06/29/2007   has not used c  pap x 8 months due to wt loss   Osteoarthritis    SINUSITIS- ACUTE-NOS 03/30/2007   Sleep apnea    THORACIC/LUMBOSACRAL NEURITIS/RADICULITIS UNSPEC 02/23/2008   Wheezing 01/08/2009   Past Surgical History:  Procedure Laterality Date   ABDOMINAL HYSTERECTOMY  2004   with BSO   BREATH TEK H PYLORI N/A 05/05/2013   Procedure: BREATH TEK H PYLORI;  Surgeon: Thayne Fine, MD;  Location: [facility];  Service: General;  Laterality: N/A;   CHOLECYSTECTOMY  1980   GASTRIC ROUX-EN-Y N/A 09/12/2013   Procedure: LAPAROSCOPIC ROUX-EN-Y GASTRIC BYPASS WITH UPPER ENDOSCOPY WITH ENTEROLYSIS OF ADHESIONS;  Surgeon: Thayne Fine, MD;  Location: [facility];  Service: General;  Laterality: N/A;   jaw bone surgury     63 yo   SHOULDER ARTHROSCOPY WITH ROTATOR CUFF REPAIR AND SUBACROMIAL DECOMPRESSION Right 01/02/2020   Procedure: SHOULDER ARTHROSCOPY WITH ROTATOR CUFF REPAIR , SUBACROMIAL DECOMPRESSION,DISTAL CLAVICLE EXICISION;  Surgeon: Sammye Cristal, MD;  Location: [facility];  Service: Orthopedics;  Laterality: Right;   TONSILLECTOMY      reports that she quit smoking about 25 years ago. Her smoking use included cigarettes. She has never used smokeless tobacco. She reports current alcohol use. She reports that she does not use drugs. family history includes Arthritis in an other family member; Asthma in her brother; Breast cancer in her maternal aunt; Cancer in her mother; Colon cancer in an  other family member; Colon polyps (age of onset: 60) in her father; Diabetes in her mother and paternal grandmother; Heart disease in her paternal grandmother; Hypertension in her mother; Lymphoma in her maternal grandmother; Osteoporosis in an other family member; Stroke in her mother; Thyroid  disease in her maternal grandmother and mother. Allergies  Allergen Reactions   Codeine Nausea Only   Feraheme  [Ferumoxytol ] Other (See Comments)    Dizzy, almost passed out, very tired and slept for hours after    Current Outpatient Medications on File Prior to Visit  Medication Sig Dispense Refill   CALCIUM  PO Take 5 mLs by mouth daily.     Cyanocobalamin  (B-12 PO) Take 5 mLs by mouth as needed (low b12).     escitalopram  (LEXAPRO ) 10 MG tablet Take 1 tablet (10 mg total) by mouth daily along with a 20 mg tablet to equal a daily dose of 30mg  90 tablet 0   escitalopram  (LEXAPRO ) 20 MG tablet Take 1 tablet (20 mg total) by mouth daily along wth the 10mg  tablet to equal a dose of 30mg  90 tablet 0   ferumoxytol  (FERAHEME ) 510 MG/17ML SOLN injection Inject 17 mLs (510 mg total) into the vein every 7 days for 2 doses. 34 mL 0   gabapentin  (NEURONTIN ) 100 MG capsule Take 2 capsules (200 mg total) by mouth at bedtime. 180 capsule 1   levothyroxine  (SYNTHROID ) 88 MCG tablet TAKE 1 TABLET BY MOUTH EVERY MORNING 90 tablet 3   Multiple Vitamins-Minerals (MULTIVITAMIN ADULT PO) Take 1 tablet by mouth daily.     prednisoLONE  acetate (PRED FORTE ) 1 % ophthalmic suspension Place 1 drop into the left eye 3 (three) times daily for 3 weeks. 10 mL 0   prednisoLONE  acetate (PRED FORTE ) 1 % ophthalmic suspension Instill 1 drop in left eye 3 times daily for 3 weeks 10 mL 0   traMADol  (ULTRAM ) 50 MG tablet Take 1 tablet (50 mg total) by mouth every 6 (six) hours as needed. 60 tablet 3   Current Facility-Administered Medications on File Prior to Visit  Medication Dose Route Frequency Provider Last Rate Last Admin   iron  dextran complex (INFED ) 25 mg in sodium chloride  0.9 % 500 mL IVPB  25 mg Intravenous Once Stechschulte, Paul J, MD       Followed by   iron  dextran complex (INFED ) 1,000 mg in sodium chloride  0.9 % 500 mL IVPB  1,000 mg Intravenous Once Stechschulte, Paul J, MD            ROS:  All others reviewed and negative.  Objective        PE:  BP 112/74   Pulse (!) 59   Temp 98 F (36.7 C) (Temporal)   Ht 5\' 3"  (1.6 m)   Wt 149 lb (67.6 kg)   SpO2 97%   BMI 26.39 kg/m                 Constitutional: Pt  appears in NAD               HENT: Head: NCAT.                Right Ear: External ear normal.                 Left Ear: External ear normal.                Eyes: . Pupils are equal, round, and reactive to light. Conjunctivae and EOM are normal  Nose: without d/c or deformity               Neck: Neck supple. Gross normal ROM               Cardiovascular: Normal rate and regular rhythm.                 Pulmonary/Chest: Effort normal and breath sounds without rales or wheezing.                Abd:  Soft, NT, ND, + BS, no organomegaly               Neurological: Pt is alert. At baseline orientation, motor grossly intact               Skin: Skin is warm. No rashes, no other new lesions, LE edema - none               Psychiatric: Pt behavior is normal without agitation   Micro: none  Cardiac tracings I have personally interpreted today:  none  Pertinent Radiological findings (summarize): none   Lab Results  Component Value Date   WBC 3.9 (L) 05/13/2022   HGB 13.7 05/13/2022   HCT 39.7 05/13/2022   PLT 355.0 05/13/2022   GLUCOSE 91 05/13/2022   CHOL 264 (H) 05/13/2022   TRIG 79.0 05/13/2022   HDL 101.10 05/13/2022   LDLDIRECT 94.8 04/29/2012   LDLCALC 147 (H) 05/13/2022   ALT 11 05/13/2022   AST 15 05/13/2022   NA 142 05/13/2022   K 4.1 05/13/2022   CL 105 05/13/2022   CREATININE 0.74 05/13/2022   BUN 13 05/13/2022   CO2 29 05/13/2022   TSH 1.57 05/13/2022   INR 0.9 04/23/2020   HGBA1C 5.6 05/13/2022   MICROALBUR 0.9 05/13/2022   Assessment/Plan:  Kelli Bell is a 63 y.o. White or Caucasian [1] female with  has a past medical history of ANXIETY (03/30/2007), BACK PAIN (02/20/2009), Essential hypertension, benign (05/31/2012), Family history of anesthesia complication, GERD (01/13/2007), H/O hiatal hernia, HYPERLIPIDEMIA (02/22/2008), HYPOTHYROIDISM (03/30/2007), MENOPAUSAL DISORDER (02/23/2008), Nocturia, OBSTRUCTIVE SLEEP APNEA (06/29/2007), Osteoarthritis, SINUSITIS-  ACUTE-NOS (03/30/2007), Sleep apnea, THORACIC/LUMBOSACRAL NEURITIS/RADICULITIS UNSPEC (02/23/2008), and Wheezing (01/08/2009).  Encounter for well adult exam with abnormal findings Age and sex appropriate education and counseling updated with regular exercise and diet Referrals for preventative services - none needed Immunizations addressed - none needed Smoking counseling  - none needed Evidence for depression or other mood disorder - none significant Most recent labs reviewed. I have personally reviewed and have noted: 1) the patient's medical and social history 2) The patient's current medications and supplements 3) The patient's height, weight, and BMI have been recorded in the chart   Temporal arteritis (HCC) Improved, for ESR with labs  Hypothyroidism Lab Results  Component Value Date   TSH 1.57 05/13/2022   Stable, pt to continue levothyroxine  88 mcg qd   Hyperlipidemia Lab Results  Component Value Date   LDLCALC 147 (H) 05/13/2022   Uncontrolled at  last lab, tolerating crestor  10 mg  well, for f/u lab today   Trigeminal neuralgia Stable, for tegretol  level  Hyperglycemia Lab Results  Component Value Date   HGBA1C 5.6 05/13/2022   Stable, pt to continue current medical treatment  - diet, wt control   Vitamin D  deficiency Last vitamin D  Lab Results  Component Value Date   VD25OH 48.32 05/13/2022   Stable, cont oral replacement   Iron  deficiency No overt bleeding, for f/u iron   level with lab  RLS (restless legs syndrome) With recent worsening, for mirapex  0.5 mg at bedtime prn  Followup: Return in about 1 year (around 05/18/2024).  Rosalia Colonel, MD 05/19/2023 10:54 AM [facility]

## 2023-05-19 NOTE — Patient Instructions (Signed)
 Please take all new medication as prescribed - the lower dose generic mirapex  for the restless leg at bedtime as needed  Please continue all other medications as before, and refills have been done if requested.  Please have the pharmacy call with any other refills you may need.  Please continue your efforts at being more active, low cholesterol diet, and weight control.  You are otherwise up to date with prevention measures today.  Please keep your appointments with your specialists as you may have planned  Please go to the LAB at the blood drawing area for the tests to be done  You will be contacted by phone if any changes need to be made immediately.  Otherwise, you will receive a letter about your results with an explanation, but please check with MyChart first.  Please make an Appointment to return for your 1 year visit, or sooner if needed

## 2023-05-20 ENCOUNTER — Encounter: Payer: Self-pay | Admitting: Internal Medicine

## 2023-05-20 LAB — CARBAMAZEPINE LEVEL, TOTAL: Carbamazepine Lvl: 2.1 mg/L — ABNORMAL LOW (ref 4.0–12.0)

## 2023-05-26 ENCOUNTER — Other Ambulatory Visit (HOSPITAL_COMMUNITY): Payer: Self-pay

## 2023-05-26 MED ORDER — PREDNISOLONE ACETATE 1 % OP SUSP
1.0000 [drp] | Freq: Three times a day (TID) | OPHTHALMIC | 0 refills | Status: DC
  Filled 2023-05-26 (×2): qty 10, 67d supply, fill #0

## 2023-05-27 ENCOUNTER — Other Ambulatory Visit: Payer: Self-pay

## 2023-05-27 ENCOUNTER — Other Ambulatory Visit: Payer: Self-pay | Admitting: Internal Medicine

## 2023-06-07 ENCOUNTER — Other Ambulatory Visit (HOSPITAL_COMMUNITY): Payer: Self-pay

## 2023-06-09 DIAGNOSIS — Z01818 Encounter for other preprocedural examination: Secondary | ICD-10-CM | POA: Diagnosis not present

## 2023-06-09 DIAGNOSIS — I10 Essential (primary) hypertension: Secondary | ICD-10-CM | POA: Diagnosis not present

## 2023-06-09 DIAGNOSIS — G4733 Obstructive sleep apnea (adult) (pediatric): Secondary | ICD-10-CM | POA: Diagnosis not present

## 2023-06-09 DIAGNOSIS — E039 Hypothyroidism, unspecified: Secondary | ICD-10-CM | POA: Diagnosis not present

## 2023-06-09 DIAGNOSIS — H2511 Age-related nuclear cataract, right eye: Secondary | ICD-10-CM | POA: Diagnosis not present

## 2023-08-20 ENCOUNTER — Other Ambulatory Visit: Payer: Self-pay | Admitting: Internal Medicine

## 2023-08-20 ENCOUNTER — Other Ambulatory Visit (HOSPITAL_COMMUNITY): Payer: Self-pay

## 2023-08-20 DIAGNOSIS — E039 Hypothyroidism, unspecified: Secondary | ICD-10-CM

## 2023-08-20 MED ORDER — LEVOTHYROXINE SODIUM 88 MCG PO TABS
88.0000 ug | ORAL_TABLET | Freq: Every morning | ORAL | 11 refills | Status: AC
  Filled 2023-08-20: qty 30, 30d supply, fill #0
  Filled 2023-09-27 – 2023-09-28 (×2): qty 30, 30d supply, fill #1
  Filled 2023-11-11: qty 30, 30d supply, fill #2
  Filled 2023-12-11: qty 30, 30d supply, fill #3

## 2023-08-21 ENCOUNTER — Other Ambulatory Visit (HOSPITAL_COMMUNITY): Payer: Self-pay

## 2023-09-28 ENCOUNTER — Other Ambulatory Visit (HOSPITAL_COMMUNITY): Payer: Self-pay

## 2023-11-11 ENCOUNTER — Other Ambulatory Visit: Payer: Self-pay | Admitting: Internal Medicine

## 2023-11-11 ENCOUNTER — Other Ambulatory Visit: Payer: Self-pay

## 2023-11-11 ENCOUNTER — Other Ambulatory Visit (HOSPITAL_COMMUNITY): Payer: Self-pay

## 2023-11-11 MED ORDER — ESCITALOPRAM OXALATE 20 MG PO TABS
ORAL_TABLET | ORAL | 1 refills | Status: AC
  Filled 2023-11-11: qty 90, 90d supply, fill #0

## 2023-11-11 MED ORDER — GABAPENTIN 100 MG PO CAPS
200.0000 mg | ORAL_CAPSULE | Freq: Every day | ORAL | 1 refills | Status: AC
  Filled 2023-11-11: qty 180, 90d supply, fill #0

## 2023-11-11 MED ORDER — ESCITALOPRAM OXALATE 10 MG PO TABS
ORAL_TABLET | ORAL | 1 refills | Status: AC
  Filled 2023-11-11: qty 90, 90d supply, fill #0

## 2023-11-12 ENCOUNTER — Other Ambulatory Visit (HOSPITAL_COMMUNITY): Payer: Self-pay

## 2023-11-12 ENCOUNTER — Other Ambulatory Visit: Payer: Self-pay | Admitting: Internal Medicine

## 2023-11-12 MED ORDER — ROSUVASTATIN CALCIUM 10 MG PO TABS
10.0000 mg | ORAL_TABLET | Freq: Every day | ORAL | 3 refills | Status: AC
  Filled 2023-11-12: qty 90, 90d supply, fill #0

## 2023-11-17 ENCOUNTER — Other Ambulatory Visit: Payer: Self-pay | Admitting: Internal Medicine

## 2023-11-17 ENCOUNTER — Other Ambulatory Visit: Payer: Self-pay

## 2023-12-13 ENCOUNTER — Other Ambulatory Visit (HOSPITAL_COMMUNITY): Payer: Self-pay

## 2023-12-27 ENCOUNTER — Emergency Department (HOSPITAL_BASED_OUTPATIENT_CLINIC_OR_DEPARTMENT_OTHER)
Admission: EM | Admit: 2023-12-27 | Discharge: 2023-12-27 | Disposition: A | Discharge status: Home / Self Care | Source: Ambulatory Visit | Attending: Emergency Medicine | Admitting: Emergency Medicine

## 2023-12-27 ENCOUNTER — Ambulatory Visit: Payer: Self-pay

## 2023-12-27 ENCOUNTER — Emergency Department (HOSPITAL_BASED_OUTPATIENT_CLINIC_OR_DEPARTMENT_OTHER)

## 2023-12-27 ENCOUNTER — Other Ambulatory Visit: Payer: Self-pay

## 2023-12-27 ENCOUNTER — Encounter (HOSPITAL_BASED_OUTPATIENT_CLINIC_OR_DEPARTMENT_OTHER): Payer: Self-pay | Admitting: *Deleted

## 2023-12-27 DIAGNOSIS — M47812 Spondylosis without myelopathy or radiculopathy, cervical region: Secondary | ICD-10-CM | POA: Diagnosis not present

## 2023-12-27 DIAGNOSIS — S0993XA Unspecified injury of face, initial encounter: Secondary | ICD-10-CM | POA: Diagnosis not present

## 2023-12-27 DIAGNOSIS — S0990XA Unspecified injury of head, initial encounter: Secondary | ICD-10-CM | POA: Diagnosis not present

## 2023-12-27 DIAGNOSIS — S199XXA Unspecified injury of neck, initial encounter: Secondary | ICD-10-CM | POA: Diagnosis not present

## 2023-12-27 DIAGNOSIS — Z471 Aftercare following joint replacement surgery: Secondary | ICD-10-CM | POA: Diagnosis not present

## 2023-12-27 DIAGNOSIS — W19XXXA Unspecified fall, initial encounter: Secondary | ICD-10-CM

## 2023-12-27 DIAGNOSIS — R55 Syncope and collapse: Secondary | ICD-10-CM | POA: Diagnosis not present

## 2023-12-27 LAB — COMPREHENSIVE METABOLIC PANEL WITH GFR
ALT: 11 U/L (ref 0–44)
AST: 24 U/L (ref 15–41)
Albumin: 4.5 g/dL (ref 3.5–5.0)
Alkaline Phosphatase: 75 U/L (ref 38–126)
Anion gap: 11 (ref 5–15)
BUN: 11 mg/dL (ref 8–23)
CO2: 27 mmol/L (ref 22–32)
Calcium: 9.4 mg/dL (ref 8.9–10.3)
Chloride: 99 mmol/L (ref 98–111)
Creatinine, Ser: 0.67 mg/dL (ref 0.44–1.00)
GFR, Estimated: >60 mL/min (ref >60)
Glucose, Bld: 78 mg/dL (ref 70–99)
Potassium: 3.9 mmol/L (ref 3.5–5.1)
Sodium: 137 mmol/L (ref 135–145)
Total Bilirubin: 0.3 mg/dL (ref 0.0–1.2)
Total Protein: 6.8 g/dL (ref 6.5–8.1)

## 2023-12-27 LAB — URINALYSIS, ROUTINE W REFLEX MICROSCOPIC
Bilirubin Urine: NEGATIVE
Glucose, UA: NEGATIVE mg/dL
Hgb urine dipstick: NEGATIVE
Ketones, ur: 40 mg/dL — AB
Nitrite: NEGATIVE
Specific Gravity, Urine: 1.026 (ref 1.005–1.030)
WBC, UA: >50 WBC/hpf (ref 0–5)
pH: 6 (ref 5.0–8.0)

## 2023-12-27 LAB — CBC
HCT: 40.4 % (ref 36.0–46.0)
Hemoglobin: 13.4 g/dL (ref 12.0–15.0)
MCH: 29.3 pg (ref 26.0–34.0)
MCHC: 33.2 g/dL (ref 30.0–36.0)
MCV: 88.4 fL (ref 80.0–100.0)
Platelets: 264 K/uL (ref 150–400)
RBC: 4.57 MIL/uL (ref 3.87–5.11)
RDW: 13.9 % (ref 11.5–15.5)
WBC: 4.3 K/uL (ref 4.0–10.5)
nRBC: 0 % (ref 0.0–0.2)

## 2023-12-27 MED ORDER — TIZANIDINE HCL 4 MG PO TABS: 4.0000 mg | ORAL_TABLET | Freq: Two times a day (BID) | ORAL | 0 refills | Status: AC | PRN

## 2023-12-27 MED ORDER — TIZANIDINE HCL 4 MG PO CAPS: 4.0000 mg | ORAL_CAPSULE | Freq: Two times a day (BID) | ORAL | 0 refills | Status: DC | PRN

## 2023-12-27 MED ORDER — OXYCODONE HCL 5 MG PO TABS
5.0000 mg | ORAL_TABLET | Freq: Once | ORAL | Status: AC
  Administered 2023-12-27: 5 mg via ORAL
  Filled 2023-12-27: qty 1

## 2023-12-27 MED ORDER — CEPHALEXIN 500 MG PO CAPS: 500.0000 mg | ORAL_CAPSULE | Freq: Four times a day (QID) | ORAL | 0 refills | Status: DC

## 2023-12-27 MED ORDER — ONDANSETRON 4 MG PO TBDP: 4.0000 mg | ORAL_TABLET | Freq: Three times a day (TID) | ORAL | 0 refills | Status: AC | PRN

## 2023-12-27 MED ORDER — ACETAMINOPHEN 500 MG PO TABS
1000.0000 mg | ORAL_TABLET | Freq: Once | ORAL | Status: AC
  Administered 2023-12-27: 1000 mg via ORAL
  Filled 2023-12-27: qty 2

## 2023-12-27 MED ORDER — MORPHINE SULFATE (PF) 2 MG/ML IV SOLN
2.0000 mg | Freq: Once | INTRAVENOUS | Status: AC
  Administered 2023-12-27: 2 mg via INTRAVENOUS
  Filled 2023-12-27: qty 1

## 2023-12-27 MED ORDER — LACTATED RINGERS IV BOLUS
1000.0000 mL | Freq: Once | INTRAVENOUS | Status: AC
  Administered 2023-12-27: 1000 mL via INTRAVENOUS

## 2023-12-27 NOTE — ED Triage Notes (Signed)
 Pt to ED reporting she became nauseous 2 days ago and while walking to the bathroom she became dizzy but then reports she does not remember what happened next. Pt woke on the ground. With blood from some oral trauma and facial abrasions. Pt now reporting a  continued headache since the fall, neck and shoulder pain, left arm pain and numbness and bruising to the chin. Family at bedside reporting patient is acting like herself but her speech sounds slurred since the event.   No blood thinners

## 2023-12-27 NOTE — Discharge Instructions (Addendum)
 Kelli Bell  Thank you for allowing us  to take care of you today.  You came to the Emergency Department today because you had a fall in the bathroom 2 days ago and since then have had headache, neck pain, face pain.  You have some bruising and tingling to your chin, however you do have intact sensation when we touch you in this area, therefore you likely have some injury to the nerve in your face, however your CT of your head, face, neck does not show any bones that are broken or out of place.  Your symptoms should improve over time, but we are giving you a referral to follow-up with a ear nose and throat doctor for further evaluation, the ear nose and throat doctors are the type of doctors that deal with trauma to the face.  We recommend alternating 600 mg ibuprofen and 500 mg Tylenol  every 3 hours (for example ibuprofen at noon, Tylenol  at 3 PM, ibuprofen at 6 PM, Tylenol  at 9 PM, etc.) for pain control.  We will also prescribe you tizanidine , muscle relaxer that you can use twice a day as needed for pain control.  You can also use heat, ice, lidocaine  patches (which are available over-the-counter).  We will prescribe a short course of Zofran  that you can use to decrease nausea and vomiting.  Your urine showed a possible urinary tract infection, therefore we are going to prescribe a antibiotic, we are sending the urine for culture given you are not having symptoms of this, if your culture does not show any bacteria  we will call you back in a few days until you that you can discontinue the antibiotic To-Do: 1. Please follow-up with your primary doctor within 1 - 2 weeks / as soon as possible.   Please return to the Emergency Department or call 911 if you experience have worsening of your symptoms, or do not get better, chest pain, shortness of breath, severe or significantly worsening pain, high fever, severe confusion, pass out or have any reason to think that you need emergency medical care.   We hope  you feel better soon.   Department of Emergency Medicine [facility]

## 2023-12-27 NOTE — Telephone Encounter (Signed)
 FYI Only or Action Required?: FYI only for provider: ED advised.  Patient was last seen in primary care on 05/19/2023 by Norleen Lynwood ORN, MD.  Called Nurse Triage reporting Fall.  Symptoms began yesterday.  Interventions attempted: Rest, hydration, or home remedies.  Symptoms are: unchanged.  Triage Disposition: Go to ED Now (Notify PCP)  Patient/caregiver understands and will follow disposition?: Yes                               1. MECHANISM: How did the fall happen?     Believes she passed out, states she felt nauseous prior to fall, states she has a hard time remembering fall 3. ONSET: When did the fall happen? (Bell.g., minutes, hours, or days ago)     Sunday 4. LOCATION: What part of the body hit the ground? (Bell.g., back, buttocks, head, hips, knees, hands, head, stomach)     Head- upper right side, face 5. INJURY: Did you hurt (injure) yourself when you fell? If Yes, ask: What did you injure? Tell me more about this? (Bell.g., body area; type of injury; pain severity)     Reports swollen face, hole between lip and bottom teeth and bruise on face  9. OTHER SYMPTOMS: Do you have any other symptoms? (Bell.g., dizziness, fever, weakness; new-onset or worsening).      Headache since fall    This RN advised ED. Patient verbalized understanding and agreed to have her daughter drive her to ED now.   Copied from CRM #[id number]. Topic: Clinical - Red Word Triage >> Dec 27, 2023  8:17 AM Kelli Bell wrote: [facility] that prompted transfer to Nurse Triage: Fall. Patient fell this past Saturday in her bathroom and buster her lower lip, now there is a hole in her mouth, mouth is very swollen. Patient also is very sore all over her body, having a headache ever since she fell.  Reason for Disposition  Injury (or injuries) that need emergency care  Protocols used: Falls and [facility]

## 2023-12-27 NOTE — ED Notes (Signed)
 Lab confirmed they will add urine culture onto existing urine sample

## 2023-12-27 NOTE — ED Provider Notes (Signed)
 [place] EMERGENCY DEPARTMENT AT [facility] Provider Note   CSN: [id number] Arrival date & time: 12/27/23  9088     History Chief Complaint  Patient presents with   Loss of Consciousness   Nausea   Fall    HPI: Kelli Bell is a 63 y.o. female with history pertinent for hypertension, hyperlipidemia, OSA, GERD, osteoarthritis, who presents complaining of fall, dizziness. Patient arrived via POV accompanied by family.  History provided by patient.  No interpreter required during this encounter.  Patient reports that on yesterday, 11/30 at 4 AM she went to the bathroom.  Does not recall the specific sequence of events, however had a fall with likely loss of consciousness.  Reports that she woke up on the floor with blood on her mouth, face, neck.  Reports that since then she has had paresthesias to her chin, headache, and posterior neck pain.  Reports that she had nausea yesterday, however no vomiting, diarrhea.  No chest pain, shortness of breath, fevers, chills.  Patient's recorded medical, surgical, social, medication list and allergies were reviewed in the Snapshot window as part of the initial history.   Prior to Admission medications   Medication Sig Start Date End Date Taking? Authorizing Provider  cephALEXin  (KEFLEX ) 500 MG capsule Take 1 capsule (500 mg total) by mouth 4 (four) times daily. 12/27/23  Yes Rogelia Jerilynn RAMAN, MD  ondansetron  (ZOFRAN -ODT) 4 MG disintegrating tablet Take 1 tablet (4 mg total) by mouth every 8 (eight) hours as needed for nausea or vomiting. 12/27/23  Yes Rogelia Jerilynn RAMAN, MD  tiZANidine  (ZANAFLEX ) 4 MG tablet Take 1 tablet (4 mg total) by mouth 2 (two) times daily as needed for muscle spasms. 12/27/23  Yes Rogelia Jerilynn RAMAN, MD  allopurinol  (ZYLOPRIM ) 100 MG tablet Take 1 tablet (100 mg total) by mouth 2 (two) times daily. 05/19/23 05/18/24  Norleen Lynwood ORN, MD  CALCIUM  PO Take 5 mLs by mouth daily.    [provider]  carbamazepine   (TEGRETOL  XR) 200 MG 12 hr tablet TAKE 1 TABLET BY MOUTH TWICE A DAY 11/17/23   Norleen Lynwood ORN, MD  celecoxib  (CELEBREX ) 200 MG capsule Take 1 capsule (200 mg total) by mouth 2 (two) times daily as needed. 05/19/23   Norleen Lynwood ORN, MD  Cyanocobalamin  (B-12 PO) Take 5 mLs by mouth as needed (low b12).    [provider]  escitalopram  (LEXAPRO ) 10 MG tablet Take 1 tablet (10 mg total) by mouth daily along with a 20 mg tablet to equal a daily dose of 30mg  11/11/23     escitalopram  (LEXAPRO ) 20 MG tablet Take 1 tablet (20 mg total) by mouth daily along wth the 10mg  tablet to equal a dose of 30mg  11/11/23     ferumoxytol  (FERAHEME ) 510 MG/17ML SOLN injection Inject 17 mLs (510 mg total) into the vein every 7 days for 2 doses. 12/17/20 12/25/20  Stechschulte, Deward PARAS, MD  gabapentin  (NEURONTIN ) 100 MG capsule Take 2 capsules (200 mg total) by mouth at bedtime. 11/11/23   Norleen Lynwood ORN, MD  levothyroxine  (SYNTHROID ) 88 MCG tablet Take 1 tablet (88 mcg total) by mouth every morning. 08/20/23 08/19/24  Norleen Lynwood ORN, MD  Multiple Vitamins-Minerals (MULTIVITAMIN ADULT PO) Take 1 tablet by mouth daily.    [provider]  omeprazole  (PRILOSEC) 20 MG capsule Take 2 capsules (40 mg total) by mouth daily. 05/19/23   Norleen Lynwood ORN, MD  pramipexole  (MIRAPEX ) 0.5 MG tablet Take 1 tablet (0.5 mg total) by  mouth at bedtime as needed. 05/19/23   Norleen Lynwood ORN, MD  prednisoLONE  acetate (PRED FORTE ) 1 % ophthalmic suspension Place 1 drop into the left eye 3 (three) times daily for 3 weeks. 02/26/23     prednisoLONE  acetate (PRED FORTE ) 1 % ophthalmic suspension Instill 1 drop in left eye 3 times daily for 3 weeks 02/26/23     prednisoLONE  acetate (PRED FORTE ) 1 % ophthalmic suspension Place 1 drop into the right eye 3 (three) times daily for 3 weeks. 05/26/23     rosuvastatin  (CRESTOR ) 10 MG tablet TAKE 1 TABLET BY MOUTH EVERY DAY 05/27/23   Norleen Lynwood ORN, MD  rosuvastatin  (CRESTOR ) 10 MG tablet Take 1 tablet (10 mg  total) by mouth daily. 11/12/23   Norleen Lynwood ORN, MD  topiramate  (TOPAMAX ) 50 MG tablet Take 1 tablet (50 mg total) by mouth daily. 05/19/23 05/18/24  Norleen Lynwood ORN, MD  traMADol  (ULTRAM ) 50 MG tablet Take 1 tablet (50 mg total) by mouth every 6 (six) hours as needed. 11/03/16   Norleen Lynwood ORN, MD     Allergies: Codeine and Feraheme  [ferumoxytol ]   Review of Systems   ROS as per HPI  Physical Exam Updated Vital Signs BP 135/67   Pulse (!) 52   Temp 98.7 F (37.1 C) (Oral)   Resp 16   SpO2 100%  Physical Exam Vitals and nursing note reviewed.  Constitutional:      General: She is not in acute distress.    Appearance: Normal appearance.  HENT:     Head: Normocephalic.     Comments: Ecchymosis to face with abrasion to mucosal surface of lower lip Eyes:     Extraocular Movements: Extraocular movements intact.  Cardiovascular:     Rate and Rhythm: Normal rate.     Pulses: Normal pulses.  Pulmonary:     Effort: Pulmonary effort is normal.  Musculoskeletal:     Cervical back: Bony tenderness present. No pain with movement.     Thoracic back: No bony tenderness.     Lumbar back: No bony tenderness.     Comments: Chest stable, nontender to AP and lateral compression, pelvis stable, nontender to AP and lateral compression, all 4 extremities appear atraumatic  Skin:    General: Skin is warm and dry.     Capillary Refill: Capillary refill takes less than 2 seconds.  Neurological:     General: No focal deficit present.     Mental Status: She is alert and oriented to person, place, and time.     Cranial Nerves: No cranial nerve deficit.     Sensory: No sensory deficit (Intact sensation to light touch and painful stimulation of the chin).     Motor: No weakness.     Gait: Gait normal.     ED Course/ Medical Decision Making/ A&P    Procedures Procedures   Medications Ordered in ED Medications  lactated ringers  bolus 1,000 mL ( Intravenous Stopped 12/27/23 1232)  acetaminophen   (TYLENOL ) tablet 1,000 mg (1,000 mg Oral Given 12/27/23 1118)  oxyCODONE  (Oxy IR/ROXICODONE ) immediate release tablet 5 mg (5 mg Oral Given 12/27/23 1118)  morphine  (PF) 2 MG/ML injection 2 mg (2 mg Intravenous Given 12/27/23 1143)    Medical Decision Making:   Agam Tuohy is a 63 y.o. female who presents for fall as per above.  Physical exam is pertinent for ecchymosis to the chin, abrasion to mucosal surface of lower lip.   The differential includes but is not limited to  ICH, TBI, fracture, dislocation, metabolic derangement, anemia, UTI.  Independent historian: Relative: Daughter  External data reviewed: No pertinent external data  Initial Plan:  Screening labs including CBC and Metabolic panel to evaluate for infectious or metabolic etiology of disease.  Urinalysis with reflex culture ordered to evaluate for UTI or relevant urologic/nephrologic pathology.   Labs: Ordered, Independent interpretation, and Details: CMP without AKI, emergent electrolyte derangement, emergent LFT abnormality.  CBC without leukocytosis, anemia, thrombocytopenia.  UA with possible UTI with large LE, bacteria, WBCs present  Radiology: Ordered, Independent interpretation, Details: Personally reviewed CT of the head, I do not appreciate ICH, displaced fracture, mass lesion, displaced fracture.  I do not appreciate displaced fracture of the face, I do not appreciate fracture, dislocation of traumatic malalignment of the C-spine., and All images reviewed independently.  Agree with radiology report at this time.   CT Head Wo Contrast Result Date: 12/27/2023 EXAM: CT HEAD WITHOUT 12/27/2023 11:14:52 AM TECHNIQUE: CT of the head was performed without the administration of intravenous contrast. Automated exposure control, iterative reconstruction, and/or weight based adjustment of the mA/kV was utilized to reduce the radiation dose to as low as reasonably achievable. COMPARISON: MRI of the head dated 12/17/2019. CLINICAL  HISTORY: Head trauma, moderate-severe. FINDINGS: BRAIN AND VENTRICLES: No acute intracranial hemorrhage. No mass effect or midline shift. No extra-axial fluid collection. No evidence of acute infarct. No hydrocephalus. ORBITS: Status post bilateral lens replacement. SINUSES AND MASTOIDS: Complete opacification of right maxillary sinus. SOFT TISSUES AND SKULL: No acute skull fracture. No acute soft tissue abnormality. IMPRESSION: 1. No acute intracranial abnormality. 2. Complete opacification of the right maxillary sinus. Electronically signed by: Evalene Coho MD 12/27/2023 11:53 AM EST RP Workstation: [id number]   CT Maxillofacial Wo Contrast Result Date: 12/27/2023 EXAM: CT OF THE FACE WITHOUT CONTRAST 12/27/2023 11:14:52 AM TECHNIQUE: CT of the face was performed without the administration of intravenous contrast. Multiplanar reformatted images are provided for review. Automated exposure control, iterative reconstruction, and/or weight based adjustment of the mA/kV was utilized to reduce the radiation dose to as low as reasonably achievable. COMPARISON: MRI of the head dated 12/17/2019. CLINICAL HISTORY: Facial trauma, blunt. FINDINGS: FACIAL BONES: Orthopedic wires are present within the maxilla bilaterally. There is a defect within the medial walls of the maxillary sinuses bilaterally. No acute facial fracture. No mandibular dislocation. No suspicious bone lesion. ORBITS: The patient is status post bilateral lens replacement. Globes are intact. No acute traumatic injury. No inflammatory change. SINUSES AND MASTOIDS: There is near complete opacification of the right maxillary sinus. The defect within the medial walls of the maxillary sinuses bilaterally is noted under FACIAL BONES. The remaining visualized sinuses and mastoids show no acute abnormality. SOFT TISSUES: No acute abnormality. IMPRESSION: 1. No acute facial fracture. Postsurgical changes. 2. Near complete opacification of the right maxillary  sinus. 3. Defect within the medial walls of the maxillary sinuses bilaterally. Electronically signed by: Evalene Coho MD 12/27/2023 11:52 AM EST RP Workstation: [id number]   CT Cervical Spine Wo Contrast Result Date: 12/27/2023 CLINICAL DATA:  Neck trauma, midline tenderness (Age 12-64y) Syncopal episode with resulting fall. EXAM: CT CERVICAL SPINE WITHOUT CONTRAST TECHNIQUE: Multidetector CT imaging of the cervical spine was performed without intravenous contrast. Multiplanar CT image reconstructions were also generated. RADIATION DOSE REDUCTION: This exam was performed according to the departmental dose-optimization program which includes automated exposure control, adjustment of the mA and/or kV according to patient size and/or use of iterative reconstruction technique. COMPARISON:  Cervical MRI 04/12/2019 FINDINGS:  Alignment: Straightening without focal angulation or significant listhesis. Skull base and vertebrae: No evidence of acute cervical spine fracture or traumatic subluxation. Soft tissues and spinal canal: No prevertebral fluid or swelling. No visible canal hematoma. Disc levels: Mild multilevel spondylosis with disc bulging, uncinate spurring and facet hypertrophy. No evidence of large disc herniation, high-grade spinal stenosis or significant foraminal narrowing. Upper chest: Clear lung apices. Other: None. IMPRESSION: 1. No evidence of acute cervical spine fracture, traumatic subluxation or static signs of instability. 2. Mild cervical spondylosis. Electronically Signed   By: Elsie Perone M.D.   On: 12/27/2023 11:29    EKG/Medicine tests: Ordered and Independent interpretation EKG Interpretation: Normal sinus rhythm Left axis deviation Abnormal ECG When compared with ECG of 09-May-2014 15:33, PREVIOUS ECG IS PRESENT Confirmed by Rogelia Satterfield ([id number]) on 12/27/2023 12:52:19 PM  Interventions: Oxycodone , Tylenol , morphine , LR bolus  See the EMR for full details regarding lab and  imaging results.  Patient presents to the emergency department after episode of loss of consciousness that occurred over 24 hours ago.  Patient's primary complaint is numbness to the chin, however patient does not fact have sensation to light touch and painful stimuli intact on the chin, possible that patient has traumatic neuropraxia to this area with damage to the mental nerve, however does not have numbness on chin or elsewhere on the face or body.  Given patient does have midline C-spine tenderness, tenderness to the face, as well as fall, do feel that patient warrants CT of the head, face, C-spine.  Patient does not have pain or tenderness to the chest, extremities,, pelvis, therefore do not feel that patient requires imaging of these areas.  On my exam, patient has normal fluent speech, speaking at her baseline per patient, no aphasia or dysarthria, therefore do not feel the patient requires MRI to evaluate for stroke given no focal neurologic deficits outside of bilateral sensory paresthesias of both sides of chin correlating to ecchymosis on exam.  Imaging obtained does not reveal acute traumatic pathology, and labs are reassuring with exception of possible UTI.  Patient is asymptomatic, therefore culture sent, however will treat with a short course of Keflex .  Patient feels improved after multimodal pain control, is ambulatory without difficulty.  Given patient does have paresthesias to her bilateral chin and has associated trauma to the area (ecchymosis) without fracture on exam, feel that patient is most appropriate for follow-up with facial trauma/ENT outpatient as well as continuation of multimodal pain therapy.  This was discussed with patient and family at bedside who are comfortable with this plan.  Presentation is most consistent with acute complicated illness and Current presentation is complicated by underlying chronic conditions  Discussion of management or test interpretations with  external provider(s): Not indicated  Risk Drugs:OTC drugs and Prescription drug management  Disposition: DISCHARGE: I believe that the patient is safe for discharge home with outpatient follow-up. Patient was informed of all pertinent physical exam, laboratory, and imaging findings. Patient's suspected etiology of their symptom presentation was discussed with the patient and all questions were answered. We discussed following up with PCP, ENT. I provided thorough ED return precautions. The patient feels safe and comfortable with this plan.  MDM generated using voice dictation software and may contain dictation errors.  Please contact me for any clarification or with any questions.  Clinical Impression:  1. Fall, initial encounter      Discharge   Final Clinical Impression(s) / ED Diagnoses Final diagnoses:  Fall, initial encounter  Rx / DC Orders ED Discharge Orders          Ordered    Ambulatory referral to ENT        12/27/23 1317    ondansetron  (ZOFRAN -ODT) 4 MG disintegrating tablet  Every 8 hours PRN        12/27/23 1318    tiZANidine  (ZANAFLEX ) 4 MG capsule  2 times daily PRN,   Status:  Discontinued        12/27/23 1318    cephALEXin  (KEFLEX ) 500 MG capsule  4 times daily        12/27/23 1318    tiZANidine  (ZANAFLEX ) 4 MG tablet  2 times daily PRN        12/27/23 1354             Rogelia Jerilynn RAMAN, MD 01/03/24 1443

## 2023-12-28 LAB — URINE CULTURE: Culture: NO GROWTH

## 2024-01-04 ENCOUNTER — Encounter: Payer: Self-pay | Admitting: Internal Medicine

## 2024-01-04 ENCOUNTER — Other Ambulatory Visit (HOSPITAL_COMMUNITY): Payer: Self-pay

## 2024-01-04 ENCOUNTER — Ambulatory Visit: Admitting: Internal Medicine

## 2024-01-04 VITALS — BP 110/70 | HR 70 | Temp 97.7°F | Ht 63.0 in | Wt 132.0 lb

## 2024-01-04 DIAGNOSIS — R739 Hyperglycemia, unspecified: Secondary | ICD-10-CM

## 2024-01-04 DIAGNOSIS — E559 Vitamin D deficiency, unspecified: Secondary | ICD-10-CM

## 2024-01-04 DIAGNOSIS — B3731 Acute candidiasis of vulva and vagina: Secondary | ICD-10-CM | POA: Insufficient documentation

## 2024-01-04 DIAGNOSIS — J329 Chronic sinusitis, unspecified: Secondary | ICD-10-CM | POA: Insufficient documentation

## 2024-01-04 MED ORDER — DOXYCYCLINE HYCLATE 100 MG PO TABS
100.0000 mg | ORAL_TABLET | Freq: Two times a day (BID) | ORAL | 0 refills | Status: AC
  Filled 2024-01-04: qty 20, 10d supply, fill #0

## 2024-01-04 MED ORDER — ONDANSETRON HCL 4 MG PO TABS
4.0000 mg | ORAL_TABLET | Freq: Three times a day (TID) | ORAL | 0 refills | Status: AC | PRN
  Filled 2024-01-04: qty 30, 10d supply, fill #0

## 2024-01-04 MED ORDER — FLUCONAZOLE 150 MG PO TABS
150.0000 mg | ORAL_TABLET | ORAL | 1 refills | Status: AC
  Filled 2024-01-04: qty 2, 6d supply, fill #0
  Filled 2024-01-16: qty 2, 6d supply, fill #1

## 2024-01-04 NOTE — Assessment & Plan Note (Signed)
 Last vitamin D  Lab Results  Component Value Date   VD25OH 49.78 05/19/2023   Stable, cont oral replacement

## 2024-01-04 NOTE — Assessment & Plan Note (Signed)
 Lab Results  Component Value Date   HGBA1C 5.8 05/19/2023   Stable, pt to continue current medical treatment  - diet,wt control

## 2024-01-04 NOTE — Assessment & Plan Note (Signed)
 Mild to mod, Also for diflucan  150 mg asd

## 2024-01-04 NOTE — Progress Notes (Signed)
 Patient ID: Kelli Bell, female   DOB: 07-20-1960, 63 y.o.   MRN: [id number]        Chief Complaint: follow up fall with nausea, syncope, sinusitis, hyperglycemia, low vit d       HPI:  Kelli Bell is a 63 y.o. female here with c/o above, she woke up from nap last wk, had nausea and was on the way to the [facility] when had syncope episode.  Seen in ED with negative CT head, c spine, and maxillofacial.  Did have sinusitis noted with complete opacification right maxillary sinus, tx with cephalexin  but unfortunately had a itchy rash mostly to the lower back after several doses.   Also has symptoms of vaginal yeast rash.  Denies worsening reflux, abd pain, dysphagia, bowel change or blood but still has occasional nausea, but could not tolerate the zofran  odt.          Wt Readings from Last 3 Encounters:  01/04/24 132 lb (59.9 kg)  05/19/23 149 lb (67.6 kg)  01/07/23 155 lb (70.3 kg)   BP Readings from Last 3 Encounters:  01/04/24 110/70  12/27/23 135/67  05/19/23 112/74         Past Medical History:  Diagnosis Date   ANXIETY 03/30/2007   BACK PAIN 02/20/2009   Essential hypertension, benign 05/31/2012   Family history of anesthesia complication    mother has hard time waking up   GERD 01/13/2007   H/O hiatal hernia    HYPERLIPIDEMIA 02/22/2008   HYPOTHYROIDISM 03/30/2007   MENOPAUSAL DISORDER 02/23/2008   Nocturia    OBSTRUCTIVE SLEEP APNEA 06/29/2007   has not used c pap x 8 months due to wt loss   Osteoarthritis    SINUSITIS- ACUTE-NOS 03/30/2007   Sleep apnea    THORACIC/LUMBOSACRAL NEURITIS/RADICULITIS UNSPEC 02/23/2008   Wheezing 01/08/2009   Past Surgical History:  Procedure Laterality Date   ABDOMINAL HYSTERECTOMY  2004   with BSO   BREATH TEK H PYLORI N/A 05/05/2013   Procedure: BREATH TEK H PYLORI;  Surgeon: Alm VEAR Angle, MD;  Location: [facility];  Service: General;  Laterality: N/A;   CHOLECYSTECTOMY  1980   GASTRIC ROUX-EN-Y N/A 09/12/2013   Procedure: LAPAROSCOPIC ROUX-EN-Y  GASTRIC BYPASS WITH UPPER ENDOSCOPY WITH ENTEROLYSIS OF ADHESIONS;  Surgeon: Alm VEAR Angle, MD;  Location: [facility];  Service: General;  Laterality: N/A;   jaw bone surgury     63 yo   SHOULDER ARTHROSCOPY WITH ROTATOR CUFF REPAIR AND SUBACROMIAL DECOMPRESSION Right 01/02/2020   Procedure: SHOULDER ARTHROSCOPY WITH ROTATOR CUFF REPAIR , SUBACROMIAL DECOMPRESSION,DISTAL CLAVICLE EXICISION;  Surgeon: Dozier Soulier, MD;  Location:  S[facility];  Service: Orthopedics;  Laterality: Right;   TONSILLECTOMY      reports that she quit smoking about 26 years ago. Her smoking use included cigarettes. She has never used smokeless tobacco. She reports current alcohol use. She reports that she does not use drugs. family history includes Arthritis in an other family member; Asthma in her brother; Breast cancer in her maternal aunt; Cancer in her mother; Colon cancer in an other family member; Colon polyps (age of onset: 99) in her father; Diabetes in her mother and paternal grandmother; Heart disease in her paternal grandmother; Hypertension in her mother; Lymphoma in her maternal grandmother; Osteoporosis in an other family member; Stroke in her mother; Thyroid  disease in her maternal grandmother and mother. Allergies  Allergen Reactions   Codeine Nausea Only   Feraheme  [Ferumoxytol ] Other (See Comments)    Dizzy,  almost passed out, very tired and slept for hours after   Cephalexin  Nausea And Vomiting and Rash   Current Outpatient Medications on File Prior to Visit  Medication Sig Dispense Refill   allopurinol  (ZYLOPRIM ) 100 MG tablet Take 1 tablet (100 mg total) by mouth 2 (two) times daily. 180 tablet 3   CALCIUM  PO Take 5 mLs by mouth daily.     carbamazepine  (TEGRETOL  XR) 200 MG 12 hr tablet TAKE 1 TABLET BY MOUTH TWICE A DAY 180 tablet 3   celecoxib  (CELEBREX ) 200 MG capsule Take 1 capsule (200 mg total) by mouth 2 (two) times daily as needed. 180 capsule 3   Cyanocobalamin  (B-12 PO) Take  5 mLs by mouth as needed (low b12).     escitalopram  (LEXAPRO ) 10 MG tablet Take 1 tablet (10 mg total) by mouth daily along with a 20 mg tablet to equal a daily dose of 30mg  90 tablet 1   escitalopram  (LEXAPRO ) 20 MG tablet Take 1 tablet (20 mg total) by mouth daily along wth the 10mg  tablet to equal a dose of 30mg  90 tablet 1   gabapentin  (NEURONTIN ) 100 MG capsule Take 2 capsules (200 mg total) by mouth at bedtime. 180 capsule 1   levothyroxine  (SYNTHROID ) 88 MCG tablet Take 1 tablet (88 mcg total) by mouth every morning. 30 tablet 11   Multiple Vitamins-Minerals (MULTIVITAMIN ADULT PO) Take 1 tablet by mouth daily.     omeprazole  (PRILOSEC) 20 MG capsule Take 2 capsules (40 mg total) by mouth daily. 180 capsule 3   ondansetron  (ZOFRAN -ODT) 4 MG disintegrating tablet Take 1 tablet (4 mg total) by mouth every 8 (eight) hours as needed for nausea or vomiting. 20 tablet 0   pramipexole  (MIRAPEX ) 0.5 MG tablet Take 1 tablet (0.5 mg total) by mouth at bedtime as needed. 90 tablet 3   prednisoLONE  acetate (PRED FORTE ) 1 % ophthalmic suspension Place 1 drop into the left eye 3 (three) times daily for 3 weeks. 10 mL 0   prednisoLONE  acetate (PRED FORTE ) 1 % ophthalmic suspension Instill 1 drop in left eye 3 times daily for 3 weeks 10 mL 0   prednisoLONE  acetate (PRED FORTE ) 1 % ophthalmic suspension Place 1 drop into the right eye 3 (three) times daily for 3 weeks. 10 mL 0   rosuvastatin  (CRESTOR ) 10 MG tablet TAKE 1 TABLET BY MOUTH EVERY DAY 90 tablet 3   rosuvastatin  (CRESTOR ) 10 MG tablet Take 1 tablet (10 mg total) by mouth daily. 90 tablet 3   tiZANidine  (ZANAFLEX ) 4 MG tablet Take 1 tablet (4 mg total) by mouth 2 (two) times daily as needed for muscle spasms. 30 tablet 0   topiramate  (TOPAMAX ) 50 MG tablet Take 1 tablet (50 mg total) by mouth daily. 90 tablet 3   traMADol  (ULTRAM ) 50 MG tablet Take 1 tablet (50 mg total) by mouth every 6 (six) hours as needed. 60 tablet 3   ferumoxytol  (FERAHEME )  510 MG/17ML SOLN injection Inject 17 mLs (510 mg total) into the vein every 7 days for 2 doses. 34 mL 0   Current Facility-Administered Medications on File Prior to Visit  Medication Dose Route Frequency Provider Last Rate Last Admin   iron  dextran complex (INFED ) 25 mg in sodium chloride  0.9 % 500 mL IVPB  25 mg Intravenous Once Stechschulte, Paul J, MD       Followed by   iron  dextran complex (INFED ) 1,000 mg in sodium chloride  0.9 % 500 mL IVPB  1,000 mg  Intravenous Once Stechschulte, Deward PARAS, MD            ROS:  All others reviewed and negative.  Objective        PE:  BP 110/70 (BP Location: Left Arm, Patient Position: Sitting, Cuff Size: Normal)   Pulse 70   Temp 97.7 F (36.5 C) (Oral)   Ht 5' 3 (1.6 m)   Wt 132 lb (59.9 kg)   SpO2 98%   BMI 23.38 kg/m                 Constitutional: Pt appears mild ill               HENT: Head: NCAT.                Right Ear: External ear normal.                 Left Ear: External ear normal. Bilat tm's with mild erythema.  Right Max sinus areas mod tender.  Pharynx with mild erythema, no exudate               Eyes: . Pupils are equal, round, and reactive to light. Conjunctivae and EOM are normal               Nose: without d/c or deformity               Neck: Neck supple. Gross normal ROM               Cardiovascular: Normal rate and regular rhythm.                 Pulmonary/Chest: Effort normal and breath sounds without rales or wheezing.                Abd:  Soft, NT, ND, + BS, no organomegaly               Neurological: Pt is alert. At baseline orientation, motor grossly intact               Skin: Skin is warm. No rashes, no other new lesions, LE edema - none               Psychiatric: Pt behavior is normal without agitation   Micro: none  Cardiac tracings I have personally interpreted today:  none  Pertinent Radiological findings (summarize): none   Lab Results  Component Value Date   WBC 4.3 12/27/2023   HGB 13.4 12/27/2023    HCT 40.4 12/27/2023   PLT 264 12/27/2023   GLUCOSE 78 12/27/2023   CHOL 200 05/19/2023   TRIG 70.0 05/19/2023   HDL 100.80 05/19/2023   LDLDIRECT 94.8 04/29/2012   LDLCALC 85 05/19/2023   ALT 11 12/27/2023   AST 24 12/27/2023   NA 137 12/27/2023   K 3.9 12/27/2023   CL 99 12/27/2023   CREATININE 0.67 12/27/2023   BUN 11 12/27/2023   CO2 27 12/27/2023   TSH 0.64 05/19/2023   INR 0.9 04/23/2020   HGBA1C 5.8 05/19/2023   MICROALBUR 1.5 05/19/2023   Assessment/Plan:  Kelli Bell is a 63 y.o. White or Caucasian [1] female with  has a past medical history of ANXIETY (03/30/2007), BACK PAIN (02/20/2009), Essential hypertension, benign (05/31/2012), Family history of anesthesia complication, GERD (01/13/2007), H/O hiatal hernia, HYPERLIPIDEMIA (02/22/2008), HYPOTHYROIDISM (03/30/2007), MENOPAUSAL DISORDER (02/23/2008), Nocturia, OBSTRUCTIVE SLEEP APNEA (06/29/2007), Osteoarthritis, SINUSITIS- ACUTE-NOS (03/30/2007), Sleep apnea, THORACIC/LUMBOSACRAL NEURITIS/RADICULITIS UNSPEC (02/23/2008), and Wheezing (01/08/2009).  Vitamin D   deficiency Last vitamin D  Lab Results  Component Value Date   VD25OH 49.78 05/19/2023   Stable, cont oral replacement   Hyperglycemia Lab Results  Component Value Date   HGBA1C 5.8 05/19/2023   Stable, pt to continue current medical treatment  - diet,wt control   Sinusitis Mild to mod, for antibx course doxycycline  100 bid course,  to f/u any worsening symptoms or concerns  Yeast vaginitis Mild to mod, Also for diflucan  150 mg asd  Followup: Return if symptoms worsen or fail to improve.  Lynwood Rush, MD 01/04/2024 7:11 PM  [facility]

## 2024-01-04 NOTE — Assessment & Plan Note (Signed)
 Mild to mod, for antibx course doxycycline  100 bid course,  to f/u any worsening symptoms or concerns

## 2024-01-04 NOTE — Patient Instructions (Addendum)
 Please take all new medication as prescribed  - the diflucan , and doxycycline   Please continue all other medications as before, and refills have been done for the regular odansetron pill for nausea  Please have the pharmacy call with any other refills you may need.  Please continue your efforts at being more active, low cholesterol diet, and weight control  Please keep your appointments with your specialists as you may have planned

## 2024-01-05 ENCOUNTER — Telehealth: Payer: Self-pay

## 2024-01-05 ENCOUNTER — Other Ambulatory Visit (HOSPITAL_COMMUNITY): Payer: Self-pay

## 2024-01-05 MED ORDER — MELOXICAM 15 MG PO TABS: 15.0000 mg | ORAL_TABLET | Freq: Every day | ORAL | 1 refills | Status: AC | PRN

## 2024-01-05 NOTE — Telephone Encounter (Signed)
 Ok for mobic q15 mg every day prn  No need to take celebrex  when taking the mobic  Please continue all other medications as before

## 2024-01-05 NOTE — Telephone Encounter (Signed)
 Copied from CRM #[id number]. Topic: General - Other >> Jan 05, 2024 10:27 AM Alfonso HERO wrote: Reason for CRM: patient fell on her face and has swelling in her face and forgot to ask the Dr during her visit if he can get her something for inflammation sent to the pharmacy.  [place] [facility] [address] [place] [address] Phone: [phone] Fax: [phone] Hours: Mon-Fri 7:30a-7p; Sat 8a-4:30p; [place] 10a-2p

## 2024-01-05 NOTE — Addendum Note (Signed)
 Addended by: NORLEEN LYNWOOD ORN on: 01/05/2024 10:59 AM   Modules accepted: Orders

## 2024-01-06 NOTE — Telephone Encounter (Signed)
 Called and let Pt know

## 2024-01-13 ENCOUNTER — Ambulatory Visit: Payer: BC Managed Care – PPO | Admitting: Nurse Practitioner

## 2024-01-13 ENCOUNTER — Encounter: Payer: Self-pay | Admitting: Nurse Practitioner

## 2024-01-13 VITALS — BP 100/72 | HR 73 | Ht 62.5 in | Wt 130.0 lb

## 2024-01-13 DIAGNOSIS — Z780 Asymptomatic menopausal state: Secondary | ICD-10-CM

## 2024-01-13 DIAGNOSIS — N8110 Cystocele, unspecified: Secondary | ICD-10-CM | POA: Diagnosis not present

## 2024-01-13 DIAGNOSIS — Z1331 Encounter for screening for depression: Secondary | ICD-10-CM

## 2024-01-13 DIAGNOSIS — Z01419 Encounter for gynecological examination (general) (routine) without abnormal findings: Secondary | ICD-10-CM

## 2024-01-13 NOTE — Progress Notes (Signed)
 Kelli Bell 05/11/1960 [id number]   History:  63 y.o. G2P2 presents for annual exam. S/P 2004 TAH BSO for endometriosis.  No HRT.  Normal Pap history.  Primary care manages hypothyroidism, HTN, HLD, bone health. 2015 gastric bypass surgery. Doing well on Lexapro  for anxiety/depression. [place] 11/30 and had a concussion.   Gynecologic History No LMP recorded. Patient has had a hysterectomy.   Contraception: status post hysterectomy Sexually active: No  Health maintenance Last Pap: 05/12/2010 Last mammogram: 11/12/2022. Results were: Normal Last colonoscopy: 04/11/2020. Results were: SSP, 5-year recall Last Dexa: 2014. Results were: normal     01/13/2024   10:11 AM  Depression screen PHQ 2/9  Decreased Interest 0  Down, Depressed, Hopeless 0  PHQ - 2 Score 0     Past medical history, past surgical history, family history and social history were all reviewed and documented in the EPIC chart. Widowed. Husband passed in 2017 from esophageal cancer, they were HS sweethearts. 2 children, live very close. 7 grandchildren. Caring for mother (just diagnosed with Alzheimer's) and father.   ROS:  A ROS was performed and pertinent positives and negatives are included.  Exam:  Vitals:   01/13/24 1001  BP: 100/72  Pulse: 73  SpO2: 96%  Weight: 130 lb (59 kg)  Height: 5' 2.5 (1.588 m)     Body mass index is 23.4 kg/m.  General appearance:  Normal Thyroid :  Symmetrical, normal in size, without palpable masses or nodularity. Respiratory  Auscultation:  Clear without wheezing or rhonchi Cardiovascular  Auscultation:  Regular rate, without rubs, murmurs or gallops  Edema/varicosities:  Not grossly evident Abdominal  Soft,nontender, without masses, guarding or rebound.  Liver/spleen:  No organomegaly noted  Hernia:  None appreciated  Skin  Inspection:  Grossly normal   Breasts: Examined lying and sitting.   Right: Without masses, retractions, discharge or axillary  adenopathy.   Left: Without masses, retractions, discharge or axillary adenopathy. Pelvic: External genitalia:  no lesions              Urethra:  normal appearing urethra with no masses, tenderness or lesions              Bartholins and Skenes: normal                 Vagina: Grade 1-2 cystocele. Atrophic changes              Cervix: absent Bimanual Exam:  Uterus:  absent              Adnexa: no mass, fullness, tenderness              Rectovaginal: Deferred              Anus:  normal, no lesions  Dereck Keas, CMA present as chaperone.   Assessment/Plan:  63 y.o. G2P2 for annual exam.   Well female exam with routine gynecological exam - Education provided on SBEs, importance of preventative screenings, current guidelines, high calcium  diet, regular exercise, and multivitamin daily. Labs with PCP.   Postmenopausal - no HRT. S/P 2004 TAH BSO for endometriosis  Depression screening - PHQ - 0  Baden-Walker grade 1 cystocele - asymptomatic. Notices pressure down there at times. Denies urinary symptoms.   Screening for cervical cancer - No longer screening per guidelines.  Screening for breast cancer - Normal mammogram history. Overdue and plans to schedule. Normal breast exam today.  Screening for colon cancer - 03/2020 colonoscopy. Will repeat at GI's recommended interval.  Screening for osteoporosis - Managed by PCP. Normal bone density in 2014.   Return in about 1 year (around 01/12/2025) for Annual.      Kelli Bell [facility], 11:00 AM 01/13/2024

## 2024-02-11 ENCOUNTER — Other Ambulatory Visit (HOSPITAL_COMMUNITY): Payer: Self-pay

## 2024-02-11 MED ORDER — HYDROCODONE-ACETAMINOPHEN 5-325 MG PO TABS
1.0000 | ORAL_TABLET | ORAL | 0 refills | Status: AC
  Filled 2024-02-11: qty 5, 1d supply, fill #0

## 2024-02-12 ENCOUNTER — Other Ambulatory Visit (HOSPITAL_COMMUNITY): Payer: Self-pay

## 2024-02-17 ENCOUNTER — Telehealth: Payer: Self-pay | Admitting: Internal Medicine

## 2024-02-17 NOTE — Telephone Encounter (Signed)
 Please see request for TOC and advise

## 2024-02-17 NOTE — Telephone Encounter (Signed)
 Pt is requesting to [facility] from [facility] to [place]. Please advise

## 2024-02-17 NOTE — Telephone Encounter (Signed)
 Please see provider response to TOC

## 2024-02-22 NOTE — Telephone Encounter (Signed)
 Pt requesting to TOC from [facility] to [place]. Please advise

## 2024-05-22 ENCOUNTER — Encounter: Admitting: Internal Medicine

## 2024-05-22 ENCOUNTER — Encounter: Admitting: Physician Assistant

## 2024-06-21 ENCOUNTER — Encounter: Admitting: Family Medicine

## 2025-01-16 ENCOUNTER — Ambulatory Visit: Admitting: Nurse Practitioner
